# Patient Record
Sex: Female | Born: 1955 | Race: Black or African American | Hispanic: No | Smoking: Never smoker
Health system: Southern US, Community
[De-identification: ages and names within clinical notes are randomized; demographics above are authoritative.]

## PROBLEM LIST (undated history)

## (undated) DIAGNOSIS — R140 Abdominal distension (gaseous): Secondary | ICD-10-CM

## (undated) DIAGNOSIS — M1990 Unspecified osteoarthritis, unspecified site: Secondary | ICD-10-CM

## (undated) DIAGNOSIS — I10 Essential (primary) hypertension: Secondary | ICD-10-CM

## (undated) DIAGNOSIS — K430 Incisional hernia with obstruction, without gangrene: Secondary | ICD-10-CM

## (undated) DIAGNOSIS — M7989 Other specified soft tissue disorders: Secondary | ICD-10-CM

## (undated) DIAGNOSIS — E785 Hyperlipidemia, unspecified: Secondary | ICD-10-CM

## (undated) DIAGNOSIS — Z5189 Encounter for other specified aftercare: Secondary | ICD-10-CM

## (undated) DIAGNOSIS — K469 Unspecified abdominal hernia without obstruction or gangrene: Secondary | ICD-10-CM

## (undated) HISTORY — DX: Encounter for other specified aftercare: Z51.89

## (undated) HISTORY — DX: Essential (primary) hypertension: I10

## (undated) HISTORY — DX: Incisional hernia with obstruction, without gangrene: K43.0

## (undated) HISTORY — DX: Abdominal distension (gaseous): R14.0

## (undated) HISTORY — PX: ABDOMINAL HYSTERECTOMY: SHX81

## (undated) HISTORY — DX: Unspecified abdominal hernia without obstruction or gangrene: K46.9

## (undated) HISTORY — DX: Unspecified osteoarthritis, unspecified site: M19.90

## (undated) HISTORY — PX: HERNIA REPAIR: SHX51

## (undated) HISTORY — DX: Other specified soft tissue disorders: M79.89

## (undated) HISTORY — DX: Hyperlipidemia, unspecified: E78.5

---

## 1998-11-30 ENCOUNTER — Other Ambulatory Visit: Admission: RE | Admit: 1998-11-30 | Discharge: 1998-11-30 | Payer: Self-pay | Admitting: Obstetrics & Gynecology

## 1999-03-28 ENCOUNTER — Encounter: Payer: Self-pay | Admitting: Obstetrics & Gynecology

## 1999-03-28 ENCOUNTER — Ambulatory Visit (HOSPITAL_COMMUNITY): Admission: RE | Admit: 1999-03-28 | Discharge: 1999-03-28 | Payer: Self-pay | Admitting: Obstetrics & Gynecology

## 2005-01-16 HISTORY — PX: HERNIA REPAIR: SHX51

## 2005-11-10 ENCOUNTER — Inpatient Hospital Stay (HOSPITAL_COMMUNITY): Admission: EM | Admit: 2005-11-10 | Discharge: 2005-11-12 | Payer: Self-pay | Admitting: Emergency Medicine

## 2005-11-10 ENCOUNTER — Encounter: Admission: RE | Admit: 2005-11-10 | Discharge: 2005-11-10 | Payer: Self-pay | Admitting: Gastroenterology

## 2010-06-06 ENCOUNTER — Other Ambulatory Visit: Payer: Self-pay | Admitting: Emergency Medicine

## 2010-06-08 ENCOUNTER — Ambulatory Visit
Admission: RE | Admit: 2010-06-08 | Discharge: 2010-06-08 | Disposition: A | Discharge status: Home / Self Care | Payer: BC Managed Care – PPO | Source: Ambulatory Visit | Attending: Emergency Medicine | Admitting: Emergency Medicine

## 2010-06-08 MED ORDER — IOHEXOL 300 MG/ML  SOLN
125.0000 mL | Freq: Once | INTRAMUSCULAR | Status: AC | PRN
  Administered 2010-06-08: 125 mL via INTRAVENOUS

## 2010-08-12 ENCOUNTER — Ambulatory Visit (INDEPENDENT_AMBULATORY_CARE_PROVIDER_SITE_OTHER): Payer: Self-pay | Admitting: Surgery

## 2010-09-07 ENCOUNTER — Ambulatory Visit (INDEPENDENT_AMBULATORY_CARE_PROVIDER_SITE_OTHER): Payer: Self-pay | Admitting: Surgery

## 2010-09-12 ENCOUNTER — Emergency Department (HOSPITAL_COMMUNITY): Payer: BC Managed Care – PPO

## 2010-09-12 ENCOUNTER — Inpatient Hospital Stay (HOSPITAL_COMMUNITY)
Admission: EM | Admit: 2010-09-12 | Discharge: 2010-09-19 | DRG: 148 | Disposition: A | Discharge status: Home / Self Care | Payer: BC Managed Care – PPO | Attending: Surgery | Admitting: Surgery

## 2010-09-12 ENCOUNTER — Other Ambulatory Visit (INDEPENDENT_AMBULATORY_CARE_PROVIDER_SITE_OTHER): Payer: Self-pay | Admitting: Surgery

## 2010-09-12 DIAGNOSIS — R112 Nausea with vomiting, unspecified: Secondary | ICD-10-CM

## 2010-09-12 DIAGNOSIS — I10 Essential (primary) hypertension: Secondary | ICD-10-CM | POA: Diagnosis present

## 2010-09-12 DIAGNOSIS — K660 Peritoneal adhesions (postprocedural) (postinfection): Secondary | ICD-10-CM | POA: Diagnosis present

## 2010-09-12 DIAGNOSIS — K560 Paralytic ileus: Secondary | ICD-10-CM | POA: Diagnosis not present

## 2010-09-12 DIAGNOSIS — K430 Incisional hernia with obstruction, without gangrene: Secondary | ICD-10-CM

## 2010-09-12 DIAGNOSIS — K559 Vascular disorder of intestine, unspecified: Secondary | ICD-10-CM | POA: Diagnosis present

## 2010-09-12 DIAGNOSIS — E7800 Pure hypercholesterolemia, unspecified: Secondary | ICD-10-CM | POA: Diagnosis present

## 2010-09-12 DIAGNOSIS — Z6841 Body Mass Index (BMI) 40.0 and over, adult: Secondary | ICD-10-CM

## 2010-09-12 DIAGNOSIS — R000 Tachycardia, unspecified: Secondary | ICD-10-CM | POA: Diagnosis present

## 2010-09-12 DIAGNOSIS — Z5331 Laparoscopic surgical procedure converted to open procedure: Secondary | ICD-10-CM

## 2010-09-12 DIAGNOSIS — R109 Unspecified abdominal pain: Secondary | ICD-10-CM

## 2010-09-12 DIAGNOSIS — K420 Umbilical hernia with obstruction, without gangrene: Principal | ICD-10-CM | POA: Diagnosis present

## 2010-09-12 DIAGNOSIS — K631 Perforation of intestine (nontraumatic): Secondary | ICD-10-CM

## 2010-09-12 DIAGNOSIS — N289 Disorder of kidney and ureter, unspecified: Secondary | ICD-10-CM | POA: Diagnosis present

## 2010-09-12 HISTORY — PX: OTHER SURGICAL HISTORY: SHX169

## 2010-09-12 HISTORY — PX: SMALL INTESTINE SURGERY: SHX150

## 2010-09-12 LAB — DIFFERENTIAL
Eosinophils Absolute: 0 10*3/uL (ref 0.0–0.7)
Lymphocytes Relative: 17 % (ref 12–46)
Lymphs Abs: 2 10*3/uL (ref 0.7–4.0)
Monocytes Relative: 10 % (ref 3–12)
Neutro Abs: 8.6 10*3/uL — ABNORMAL HIGH (ref 1.7–7.7)
Neutrophils Relative %: 73 % (ref 43–77)

## 2010-09-12 LAB — COMPREHENSIVE METABOLIC PANEL
ALT: 8 U/L (ref 0–35)
AST: 17 U/L (ref 0–37)
CO2: 25 mEq/L (ref 19–32)
Calcium: 10.8 mg/dL — ABNORMAL HIGH (ref 8.4–10.5)
Chloride: 99 mEq/L (ref 96–112)
Creatinine, Ser: 1.18 mg/dL — ABNORMAL HIGH (ref 0.50–1.10)
GFR calc Af Amer: 57 mL/min — ABNORMAL LOW (ref >60)
GFR calc non Af Amer: 47 mL/min — ABNORMAL LOW (ref >60)
Glucose, Bld: 100 mg/dL — ABNORMAL HIGH (ref 70–99)
Sodium: 137 mEq/L (ref 135–145)
Total Bilirubin: 0.5 mg/dL (ref 0.3–1.2)

## 2010-09-12 LAB — URINALYSIS, ROUTINE W REFLEX MICROSCOPIC
Glucose, UA: NEGATIVE mg/dL
Hgb urine dipstick: NEGATIVE
Ketones, ur: 15 mg/dL — AB
Protein, ur: NEGATIVE mg/dL
Urobilinogen, UA: 0.2 mg/dL (ref 0.0–1.0)

## 2010-09-12 LAB — CBC
Hemoglobin: 14.6 g/dL (ref 12.0–15.0)
MCH: 26.1 pg (ref 26.0–34.0)
MCV: 80 fL (ref 78.0–100.0)
Platelets: 359 10*3/uL (ref 150–400)
RBC: 5.6 MIL/uL — ABNORMAL HIGH (ref 3.87–5.11)
WBC: 11.8 10*3/uL — ABNORMAL HIGH (ref 4.0–10.5)

## 2010-09-13 LAB — CBC
HCT: 33.5 % — ABNORMAL LOW (ref 36.0–46.0)
MCH: 25.6 pg — ABNORMAL LOW (ref 26.0–34.0)
MCHC: 32.2 g/dL (ref 30.0–36.0)
MCV: 79.4 fL (ref 78.0–100.0)
Platelets: 248 10*3/uL (ref 150–400)
RDW: 17.5 % — ABNORMAL HIGH (ref 11.5–15.5)

## 2010-09-13 LAB — BASIC METABOLIC PANEL
BUN: 15 mg/dL (ref 6–23)
Calcium: 8.8 mg/dL (ref 8.4–10.5)
Creatinine, Ser: 1.13 mg/dL — ABNORMAL HIGH (ref 0.50–1.10)
GFR calc Af Amer: >60 mL/min (ref >60)
GFR calc non Af Amer: 50 mL/min — ABNORMAL LOW (ref >60)

## 2010-09-14 DIAGNOSIS — I10 Essential (primary) hypertension: Secondary | ICD-10-CM

## 2010-09-14 DIAGNOSIS — N289 Disorder of kidney and ureter, unspecified: Secondary | ICD-10-CM

## 2010-09-14 LAB — CBC
HCT: 33.4 % — ABNORMAL LOW (ref 36.0–46.0)
MCHC: 31.7 g/dL (ref 30.0–36.0)
Platelets: 260 10*3/uL (ref 150–400)
RDW: 17.9 % — ABNORMAL HIGH (ref 11.5–15.5)

## 2010-09-14 LAB — BASIC METABOLIC PANEL
BUN: 19 mg/dL (ref 6–23)
GFR calc Af Amer: 52 mL/min — ABNORMAL LOW (ref >60)
GFR calc non Af Amer: 43 mL/min — ABNORMAL LOW (ref >60)
Potassium: 3.8 mEq/L (ref 3.5–5.1)
Sodium: 135 mEq/L (ref 135–145)

## 2010-09-15 LAB — BASIC METABOLIC PANEL
Chloride: 101 mEq/L (ref 96–112)
Creatinine, Ser: 0.83 mg/dL (ref 0.50–1.10)
GFR calc Af Amer: >60 mL/min (ref >60)
Sodium: 136 mEq/L (ref 135–145)

## 2010-09-15 NOTE — H&P (Signed)
[name], [name]             ACCOUNT NO.:  [id number]  MEDICAL RECORD NO.:  [id number]  LOCATION:  WLED                         FACILITY:  [facility]  PHYSICIAN:  Adin Hector, MD     DATE OF BIRTH:  1955-12-29  DATE OF ADMISSION:  09/12/2010 DATE OF DISCHARGE:                             HISTORY & PHYSICAL   REQUESTING PHYSICIAN:  Dr. Tomi Bamberger.  PRIMARY CARE PHYSICIAN:  Dr. Arlyss Queen, [facility].  OTHER SURGEON:  Dr. Alphonsa Overall.  REASON FOR EVALUATION:  Nausea, vomiting, abdominal pain, history of ventral hernia, consideration of question of incarceration.  HISTORY OF PRESENT ILLNESS:  [name] is a 55 year old super morbidly obese female with a BMI around 38, who required emergent operation in 2007.  She had an incarcerated periumbilical ventral hernia that required emergent repair by Dr. Lucia Gaskins in an open fashion with onlay MTF mesh.  She survived that.  She began to have some abdominal pain and went to the ER in May 2012.  CT scan revealed a recurrent ventral hernia.  It was not obstructing.  She was sent to see Dr. Lucia Gaskins for consideration of elective repair.  He is trying to get her on weight loss regimen to help decrease her risk of recurrence.  However, 2 days ago, she noted an episode of periumbilical abdominal pain.  [facility].  The pain was intensified.  She went to [facility] [place] around 8 o'clock this morning.  They did workup and were concerned about incarcerated hernia.  She was recommended she go to the emergency department.  She got here and had workup showing some leukocytosis.  Then we are going to finally get a CAT scan done this afternoon and it was finished around 5 o'clock.  I was consulted around 6 o'clock while I was already in the emergency room.  She claims her pain had gone down, but since taking the contrast, she is having worsening heartburn and abdominal discomfort now.  She has not had flatus for  several days and does not had a bowel movement for a couple of days that she only has on a daily basis.  She has been feeling nauseous as well.  She is here with a friend.  She does have heartburn, but no more now, that is well controlled.  PAST MEDICAL HISTORY: 1. Morbid obesity. 2. Hypertension. 3. Hypercholesterolemia. 4. Prior hernia, requiring emergent open repair with biologic mesh in     a routine fashion.  ALLERGIES:  None known.  MEDICATIONS:  She states she cannot recall her medicines, but on review, she takes, 1. Hydrochlorothiazide 12.5 mg p.o. daily. 2. Naproxen q.8 h. p.r.n. pain. 3. Nexium 40 mg daily. 4. Lipitor 40 mg p.o. daily.  SOCIAL HISTORY:  No tobacco, alcohol, or drug use.  She works as a Training and development officer for [employer].  FAMILY HISTORY:  Negative for any immediate GI or bleeding disorders that she can recall.  REVIEW OF SYSTEMS:  As per HPI.  She denies any fever, chills, or sweats.  She has lost about 10 pounds in the past few months.  CARDIAC: No exertional chest pain, although she gets short of  breath after walking about 3 blocks.  No angina.  No significant orthopnea or PND. GI:  No hematochezia or melena.  No hematemesis.  No dysphagia to solids or liquids.  Otherwise, negative.  SKIN:  Negative.  ENDOCRINE: Negative.  HEME, LYMPH, ALLERGIC:  Negative.  PSYCHIATRIC:  Negative. MUSCULOSKELETAL:  Some mild joint disorders, but no major changes.  PHYSICAL EXAMINATION:  VITAL SIGNS:  Temperature 97.9, pulse initially 119, currently 105, respiratory rate 18, initially 8/10 pain when she came in here at 10:30 in the morning, now she claims down to around 3/10 to 4/10.  Saturation 95% on room air and blood pressure 138/90. GENERAL:  She is a well-developed and well-nourished, morbidly obese female, sitting, particularly toxic, little uncomfortable. EYES:  Pupils equal, round, and reactive to light.  Extraocular movements are intact.  Her sclerae are  nonicteric. NECK:  Supple.  No masses.  Trachea is midline. HEART:  Regular rate and rhythm.  No murmurs, clicks, or rubs. LYMPH:  No head, neck, axillary, or groin lymphadenopathy. ENT:  Her mucous membranes are dry, but nasopharynx and oropharynx are clear. BREASTS:  No obvious masses or discharge. LUNGS:  Clear to auscultation bilaterally. ABDOMEN:  Morbidly obese, but for the most part soft.  She does have little firmness supraumbilically.  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  I lifted up, there is a mild foul odor, but otherwise no ulcerations.  Underneath that I can feel a 10 x 10 cm ellipsoid mass that is firm and uncomfortable.  I can feel little bit of bone marrow within it, but I cannot get it to reduce down.  It is uncomfortable on palpation, but not unbearable.  She has an incision supraumbilically that is well healed.  I can feel the hernial sac through the top part of her umbilicus.  It is uncomfortable. GENITAL:  No obvious vaginal bleeding, no inguinal hernias. RECTAL:  Deferred. MUSCULOSKELETAL:  Pretty good range of motion at shoulders, elbows, wrists, as well as hips, knees, and ankles. SKIN:  No petechia or purpura. PSYCHIATRIC:  Seems pleasant with no evidence of any dementia, delirium, psychosis, or paranoia.  LABORATORY VALUES:  Her white count is 11.8, which does have left shift. At triage, her creatinine is 1.18 and glucose 100.  She has a chest x- ray that does not show any major changes.  She has a CT scan, which shows a ventral hernia with small bowel within it.  This is compared with the one in May.  However, at this point, it is more dilated and has dilated bowel proximal and decompressed distal to it.  This is concerning for bowel obstruction.  There is no pneumatosis.  There is no free air or obvious perforation.  Her bowel is not massively dilated nor as her colon.  ASSESSMENT AND PLAN:  A super morbid obese female with  evidence of bowel obstruction with obstipation for 2 days and persistent pain, little bit of tachycardia and leukocytosis.  I am concerned this is incarcerated.  Hopefully, it has been necrotic, but she has been going on with this for being evaluated for the past 11 hours.  I recommend we start with diagnostic laparoscopy and probable laparotomy, given her body habitus, but  I would like to try and minimize wound issues if we can.  I will try and reduce the hernia laparoscopically and if there is no evidence of any ischemia or necrosis, and no bowel resection is required,  then repair it with a mesh.  Otherwise, will have a low threshold to open and just try and do primary repair with underlay, although given her super morbid obesity, she is a high risk for recurrence.  Again, Dr. Lucia Gaskins already elucidated.  The technique and risks, benefits, and alternatives were discussed. Risks of bleeding and infection discussed in detail.  However, given how long she has had persistent symptoms and pain, I do not think that she can weight any longer and we told that NG tube is going to solve this problem since I cannot get it reduced.  She and her family agreed to proceed.     Adin Hector, MD     SCG/MEDQ  D:  09/12/2010  T:  09/12/2010  Job:  [id number]  Electronically Signed by Michael Boston MD on 09/15/2010 01:36:27 PM

## 2010-09-15 NOTE — Op Note (Signed)
[name], [name]             ACCOUNT NO.:  [id number]  MEDICAL RECORD NO.:  [id number]  LOCATION:  [id number]                         FACILITY:  [facility]  PHYSICIAN:  Virginia Hector, MD     DATE OF BIRTH:  04-Mar-1955  DATE OF PROCEDURE:  09/12/2010 DATE OF DISCHARGE:                              OPERATIVE REPORT   PRIMARY CARE PHYSICIAN:  Virginia Landry A. Everlene Farrier, MD  PRIMARY SURGEON:  Virginia Malling. Lucia Gaskins, MD  OPERATING SURGEON:  Virginia Hector, MD  ASSISTANT:  RN  PREOPERATIVE DIAGNOSIS:  Recurrent incarcerated periumbilical ventral hernia with small bowel obstruction.  POSTOPERATIVE DIAGNOSIS:  Recurrent incarcerated periumbilical ventral hernia with small bowel obstruction.  PROCEDURES PERFORMED: 1. Laparoscopic lysis of adhesions for 90 minutes. 2. Open lysis of adhesions for 30 minutes. 3. Serosal repair x2. 4. Enterotomy repair x1. 5. Small bowel resection 30 cm. 6. Open ventral hernia repair with mesh (Underlay MTF biologic mesh,     10 x 16 cm).  SPECIMENS:  Incarcerated small bowel.  DRAINS:  A 19-French Blake drain, goes from left upper quadrant, rests on the subcutaneous tissues on top of the repair.  ESTIMATED BLOOD LOSS:  250 mL.  COMPLICATIONS:  None major.  INDICATIONS:  [name] is a 55 year old morbidly obese female with a BMI in the mid-50s who had obstipation for 2 days and was found to have evidence of an incarcerated ventral hernia with small bowel obstruction. She had a similar episode 5 years ago.  Dr. Lucia Myers had to do emergently a surgery on her and had to resect out a large hernia cavity and do repair with biologic mesh.  She developed abdominal pain and was found to have evidence of recurrence on a CT scan in May.  She had seen Dr. Lucia Myers there, we are discussing possibility of an elective repair, but there is discussion about trying to improve weight loss.  However, she developed these symptoms past 2 days.  She went to the [facility] [place] this  morning.  They sent her to the emergency room.  They evaluated her today.  They got a CAT scan in the evening.  I was consulted an hour after the CAT scan showing bowel obstruction.  The pathophysiology of herniation was discussed.  Given the fact that hernia was not reducible and she has pain, leukocytosis and some tachycardia, I recommended an urgent exploration.  Laparoscopic and possible open techniques were discussed, possible need for bowel resection and other issues were discussed.  Risks, benefits, and alternatives were discussed, and she and her sisters agreed to proceed.  OPERATIVE FINDINGS:  She had some moderate central adhesions.  She had a significant loop of small bowel that was adherent into the hernia sac and cannot be reduced laparoscopically.  She had some dense bands within the hernia sac causing at least 2 very tight transition points.  She had numerous smaller Swiss cheese hernias nearby as well.  She also had ischemia, but no definite necrosis within the sac.  DESCRIPTION OF THE PROCEDURE:  Informed consent was confirmed.  The patient received IV Zosyn prior to incision.  She underwent general anesthesia without any difficulty.  She had a Foley catheter still  in place.  She was positioned supine with arms tucked with splint.  Her arms were placed to side and protected with a sledge since she was too obese to do just tucking of arms.  Her abdomen and panniculus were prepped and draped in sterile fashion.  Surgical time-out confirmed our plan.  I placed a #5 mm port in the left upper quadrant using optical entry technique with the patient in steep reverse Trendelenburg and left side up.  Abdomen was clean.  Under direct visualization, I placed 5-0 ports in the left flank and left upper quadrant and later in the right upper quadrant.  I then transitioned one to a balloon port, especially near the flank __________ coming out.  Inspection revealed some omentum  and large small to small bowel going centrally on the anterior abdominal wall.  Using cold scissors and with tongs, I was able to free the greater omentum off this and started freeing loops of bowel off the anterior abdominal wall.  There were some rather dense tight bands of adhesions that were gradually able to free these off.  Then found some omentum and small bowel mesentery incarcerated up in smaller pockets of less than 1 cm of ventral hernia, which I gradually reduced down.  I could find the distal end was decompressed and seemed to more in the proximal ileum, but I cannot give it to reduce for  a while.  I did try changing camera and looking on the opposite site, on the right side.  I did some more sharp dissections.  I was able to reduce some small bowel out of the hernia sac after freeing the hernia sac completely around. However, I got to the point where I was having to do a fair amount of torquing and I could not get it to physically reduce any more with external pressure on the hernia sac and laparoscopically.  Therefore, I converted to open.  I went through the periumbilical incision and did dissection and found a very thickened hernia sac, about 10 x 10 cm in size.  Eventually freed around it and could gradually get into part of the sac.  It was quite loculated off with numerous bridging bands.  Eventually, I was able to open the hernia sac and find the proximal end and help mobilize that.  Over time, I was able to open up the hernia sac pocket by pocket until I could get the section of small bowel that was incarcerated within it, better freed up.  There were some dense bands within the hernia sac itself that were bridging across the bowel, and once I this freed these off, I have at least some obvious transition points x2.  I brought in some small bowel up from the abdomen as well and inspected.  There was a small enterotomy, which I repaired using interrupted silks, and  then there were a few serosal tears. However, there were couple areas, where the bowel had gotten thinned out.  The bowel did not seem to be pinking up as it was seen rather ischemic.  Given the fact that it already had a couple transition points and some ischemia and friable serosa on it, I decided to resect to the sections, since that already had been an enterotomy.  Therefore, I did a side-to- side anastomosis and resected this __________ of inflamed small bowel with numerous adhesions to hernia sac, advanced to transition point until I had healthy uninvolved bowel.  I used the GIA stapler 75, doing  a side-to-side anastomosis.  I closed the common defect using interrupted silk stitches and closed the mesenteric defect using 2-0 silk stitches as well.  I had taken the mesentery using a bipolar system.  I used 2 interrupted stitches for hemostasis as well.  I then have to open up the hernia little bit more to help ultimately release the bowel and also to help it return back down.  With that, I can better see the dimensions of the ventral hernia defect, this was about 5 x 6 cm in size.  I went ahead and mobilized some of the hernia sac although it was somewhat ischemic and not too viable, but I was able to find the peritoneum and free that around circumferentially.  I decided to use an underlay biologic mesh since Dr. Lucia Myers has used an onlay.  I did not think it would be wise in an emergent situation, especially with breached  Bowel and some ischemia, to place a permanent mesh in there. I chose a 10 x 16 cm piece of biologic mesh.  I closed the peritoneum using 0 Vicryl running stitch.  I placed the mesh into the preperitoneal plane and secured it to the anterior abdominal wall using transabdominal transfascial #1 Prolene interrupted stitches x12 around the periphery. I used a laparoscopic suture passer under direct visualization to help pass it into the preperitoneal plane and bring  those tails up and tie down.  This provided good coverage all the way down, the mesh laid well without any folding or twisting.  I then reapproximated the hernia defect using #1 Prolene figure-of-8 stitches, interrupted.  I took a couple of bites of the biologic mesh with the closure as well to help secure it in place.  I did diagnostic laparoscopy and found no major injury to bowel. Hemostasis was good.  I saw no specific contents.  I copiously irrigated the wound after each layer of closure including just before skin closure.  I placed a drain, as noted above.  I closed the skin using staples.  I removed all the ports, 5 mm ports, I did not do more aggressive fashion closure.  The patient is being extubated and will be in recovery room.  We will keep her a nasogastric tube in given the obstruction with obvious transition zones and small bowel resection.  The family was updated during the OR case and I am about to discuss it with them again.     Virginia Hector, MD     SCG/MEDQ  D:  09/13/2010  T:  09/13/2010  Job:  [id number]  cc:   [name], M.D. Fax: [place]. Lucia Myers, M.D. [id number] [address] [place] [address]  Electronically Signed by Michael Boston MD on 09/15/2010 01:36:53 PM

## 2010-09-22 ENCOUNTER — Ambulatory Visit (INDEPENDENT_AMBULATORY_CARE_PROVIDER_SITE_OTHER): Payer: BC Managed Care – PPO | Admitting: Surgery

## 2010-09-22 ENCOUNTER — Encounter (INDEPENDENT_AMBULATORY_CARE_PROVIDER_SITE_OTHER): Payer: Self-pay | Admitting: Surgery

## 2010-09-22 VITALS — BP 152/98 | HR 60 | Temp 96.8°F

## 2010-09-22 DIAGNOSIS — R600 Localized edema: Secondary | ICD-10-CM

## 2010-09-22 DIAGNOSIS — K430 Incisional hernia with obstruction, without gangrene: Secondary | ICD-10-CM

## 2010-09-22 DIAGNOSIS — R609 Edema, unspecified: Secondary | ICD-10-CM

## 2010-09-22 DIAGNOSIS — K436 Other and unspecified ventral hernia with obstruction, without gangrene: Secondary | ICD-10-CM

## 2010-09-22 HISTORY — DX: Incisional hernia with obstruction, without gangrene: K43.0

## 2010-09-22 NOTE — Progress Notes (Addendum)
Subjective:     Patient ID: [name], female   DOB: 1955/12/27, 55 y.o.   MRN: [id number]  HPI  Diagnosis: Recurrently incarcerated periumbilical ventral hernia incarcerated with some strangulation.  Procedure: Emergent laparoscopic and open reduction and repair of hernia with biologic mesh. Small bowel resection. August 2012.   Reason for visit: Followup.  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 Lucia Gaskins. She developed recurrence. He was working try and see if she could lose some weight before considering a laparoscopic repair when she came in with an incarcerated hernia and bowel obstruction. She required emergent surgery. She was the hospital for some time but has been out of the hospital for 3 days.  She patient comes today with her sister. She notes her appetite is down but she is tolerating most foods. Loose bowel movement most days. She's been out of the hospital for 3 days. Pain is going down. No hematochezia hematemesis nor melena. No fevers chills or sweats. Energy level is down but improving. She notes her legs are swollen and as a concern especially with her sister.  Review of Systems  Constitutional: Negative for fever, chills, diaphoresis, appetite change and fatigue.  HENT: Negative for ear pain, sore throat, trouble swallowing, neck pain and ear discharge.   Eyes: Negative for photophobia, discharge and visual disturbance.  Respiratory: Negative for cough, choking, chest tightness and shortness of breath.   Cardiovascular: Positive for leg swelling. Negative for chest pain and palpitations.  Gastrointestinal: Positive for abdominal pain. Negative for nausea, vomiting, diarrhea, constipation, anal bleeding and rectal pain.  Genitourinary: Negative for dysuria, frequency and difficulty urinating.  Musculoskeletal: Negative for myalgias and gait problem.  Skin: Negative for  color change, pallor and rash.  Neurological: Negative for dizziness, speech difficulty, weakness and numbness.  Hematological: Negative for adenopathy.  Psychiatric/Behavioral: Negative for confusion and agitation. The patient is not nervous/anxious.        Objective:   Physical Exam  Constitutional: She is oriented to person, place, and time. She appears well-developed and well-nourished. No distress.  HENT:  Head: Normocephalic.  Mouth/Throat: Oropharynx is clear and moist. No oropharyngeal exudate.  Eyes: Conjunctivae and EOM are normal. Pupils are equal, round, and reactive to light. No scleral icterus.  Neck: Normal range of motion. Neck supple. No tracheal deviation present.  Cardiovascular: Normal rate, regular rhythm and intact distal pulses.   Pulmonary/Chest: Effort normal and breath sounds normal. No respiratory distress. She exhibits no tenderness.  Abdominal: Soft. She exhibits no distension. There is no rebound and no guarding. Hernia confirmed negative in the right inguinal area and confirmed negative in the left inguinal area.       Midline umb incision and lap incisions closed.  Staples removed.  SQ bulb drain ~19mL a day.  Drain removed  Large panniculus to mid thigh.  No cellulitis  Genitourinary: No vaginal discharge found.  Musculoskeletal: Normal range of motion. She exhibits no tenderness.       BLE mod 2+ edema to calf  Lymphadenopathy:    She has no cervical adenopathy.       Right: No inguinal adenopathy present.       Left: No inguinal adenopathy present.  Neurological: She is alert and oriented to person, place, and time. No cranial nerve deficit. She exhibits normal muscle tone. Coordination normal.  Skin: Skin is warm and dry. No rash noted. She is  not diaphoretic. No erythema.  Psychiatric: She has a normal mood and affect. Her behavior is normal. Judgment and thought content normal.       Assessment:     Recurrent incarcerated ventral hernia with  some strangulation causing bowel obstruction  requiring emergent repair and resection. Gradually recovering.    Plan:     Considering what she's been through, I think she's recovering relatively well. I stressed that nutrition and exercise are the key to recovering. Recommend she try supplemental shakes. Given her morbid obesity and arthralgias, I think the exercise will be rather limited. However it should get better. I recommend she get on a fiber bowel regimen to keep regular.  I would like to see her in a few more weeks to make she she's getting stronger. Hopefully with the larger piece of biologic mesh she can avoid a repeat surgery. If she does recur, she could get benefit with panniculectomy concurrent with her hernia to minimize chance of another recurrence. However, that is a much larger operation with increased morbidity and wound problems.  Return to clinic to 3 weeks to make sure she's continued to improve.  With her increased edema recommend she double hydrochlorothiazide for the next week. However, I strongly recommend she followup with her primary care physician to make sure that she has or other health issues undressed and follow closely given the stress of an emergent operation and prolonged hospital stay. She and her sister expressed understanding and appreciation.

## 2010-09-22 NOTE — Patient Instructions (Signed)
Increase HCTZ diuretic to 25mg  (2 pills) a day (for leg swelling) until 13Sept, then back to 12.5 mg a day.  Shower over incisions every day

## 2010-09-27 ENCOUNTER — Emergency Department (HOSPITAL_COMMUNITY)
Admission: EM | Admit: 2010-09-27 | Discharge: 2010-09-28 | Disposition: A | Discharge status: Home / Self Care | Payer: BC Managed Care – PPO | Attending: Emergency Medicine | Admitting: Emergency Medicine

## 2010-09-27 ENCOUNTER — Emergency Department (HOSPITAL_COMMUNITY): Payer: BC Managed Care – PPO

## 2010-09-27 DIAGNOSIS — R0602 Shortness of breath: Secondary | ICD-10-CM | POA: Insufficient documentation

## 2010-09-27 DIAGNOSIS — R000 Tachycardia, unspecified: Secondary | ICD-10-CM | POA: Insufficient documentation

## 2010-09-27 DIAGNOSIS — E785 Hyperlipidemia, unspecified: Secondary | ICD-10-CM | POA: Insufficient documentation

## 2010-09-27 DIAGNOSIS — M7989 Other specified soft tissue disorders: Secondary | ICD-10-CM | POA: Insufficient documentation

## 2010-09-27 DIAGNOSIS — I10 Essential (primary) hypertension: Secondary | ICD-10-CM | POA: Insufficient documentation

## 2010-09-27 DIAGNOSIS — R609 Edema, unspecified: Secondary | ICD-10-CM | POA: Insufficient documentation

## 2010-09-27 DIAGNOSIS — Z9889 Other specified postprocedural states: Secondary | ICD-10-CM | POA: Insufficient documentation

## 2010-09-27 DIAGNOSIS — L02219 Cutaneous abscess of trunk, unspecified: Secondary | ICD-10-CM | POA: Insufficient documentation

## 2010-09-27 LAB — POCT I-STAT, CHEM 8
BUN: 4 mg/dL — ABNORMAL LOW (ref 6–23)
Hemoglobin: 10.5 g/dL — ABNORMAL LOW (ref 12.0–15.0)
Sodium: 136 mEq/L (ref 135–145)
TCO2: 25 mmol/L (ref 0–100)

## 2010-09-28 ENCOUNTER — Encounter (HOSPITAL_COMMUNITY): Payer: Self-pay

## 2010-09-28 LAB — DIFFERENTIAL
Basophils Absolute: 0 10*3/uL (ref 0.0–0.1)
Basophils Relative: 0 % (ref 0–1)
Neutro Abs: 7.4 10*3/uL (ref 1.7–7.7)
Neutrophils Relative %: 77 % (ref 43–77)

## 2010-09-28 LAB — URINALYSIS, ROUTINE W REFLEX MICROSCOPIC
Leukocytes, UA: NEGATIVE
Protein, ur: NEGATIVE mg/dL
Urobilinogen, UA: 1 mg/dL (ref 0.0–1.0)

## 2010-09-28 LAB — PRO B NATRIURETIC PEPTIDE: Pro B Natriuretic peptide (BNP): 162.4 pg/mL — ABNORMAL HIGH (ref 0–125)

## 2010-09-28 LAB — CBC
Hemoglobin: 9.8 g/dL — ABNORMAL LOW (ref 12.0–15.0)
RBC: 3.86 MIL/uL — ABNORMAL LOW (ref 3.87–5.11)

## 2010-09-28 LAB — PROCALCITONIN: Procalcitonin: 0.13 ng/mL

## 2010-09-28 LAB — LACTIC ACID, PLASMA: Lactic Acid, Venous: 1.3 mmol/L (ref 0.5–2.2)

## 2010-09-28 MED ORDER — IOHEXOL 300 MG/ML  SOLN
100.0000 mL | Freq: Once | INTRAMUSCULAR | Status: AC | PRN
  Administered 2010-09-28: 100 mL via INTRAVENOUS

## 2010-09-29 ENCOUNTER — Inpatient Hospital Stay (HOSPITAL_COMMUNITY)
Admission: EM | Admit: 2010-09-29 | Discharge: 2010-10-03 | DRG: 418 | Disposition: A | Discharge status: Home / Self Care | Payer: BC Managed Care – PPO | Attending: General Surgery | Admitting: General Surgery

## 2010-09-29 DIAGNOSIS — T8140XA Infection following a procedure, unspecified, initial encounter: Principal | ICD-10-CM | POA: Diagnosis present

## 2010-09-29 DIAGNOSIS — I10 Essential (primary) hypertension: Secondary | ICD-10-CM | POA: Diagnosis present

## 2010-09-29 DIAGNOSIS — D649 Anemia, unspecified: Secondary | ICD-10-CM | POA: Diagnosis present

## 2010-09-29 DIAGNOSIS — E785 Hyperlipidemia, unspecified: Secondary | ICD-10-CM | POA: Diagnosis present

## 2010-09-29 DIAGNOSIS — L02219 Cutaneous abscess of trunk, unspecified: Secondary | ICD-10-CM | POA: Diagnosis present

## 2010-09-29 DIAGNOSIS — L03319 Cellulitis of trunk, unspecified: Secondary | ICD-10-CM | POA: Diagnosis present

## 2010-09-29 DIAGNOSIS — K219 Gastro-esophageal reflux disease without esophagitis: Secondary | ICD-10-CM | POA: Diagnosis present

## 2010-09-29 LAB — CBC
MCH: 25 pg — ABNORMAL LOW (ref 26.0–34.0)
MCHC: 31.5 g/dL (ref 30.0–36.0)
MCV: 79.3 fL (ref 78.0–100.0)
Platelets: 404 10*3/uL — ABNORMAL HIGH (ref 150–400)
RBC: 3.68 MIL/uL — ABNORMAL LOW (ref 3.87–5.11)
RDW: 17.4 % — ABNORMAL HIGH (ref 11.5–15.5)

## 2010-09-29 LAB — BASIC METABOLIC PANEL
CO2: 25 mEq/L (ref 19–32)
Calcium: 9 mg/dL (ref 8.4–10.5)
Creatinine, Ser: 0.85 mg/dL (ref 0.50–1.10)
GFR calc non Af Amer: >60 mL/min (ref >60)
Sodium: 135 mEq/L (ref 135–145)

## 2010-09-29 LAB — DIFFERENTIAL
Basophils Relative: 0 % (ref 0–1)
Eosinophils Absolute: 0 10*3/uL (ref 0.0–0.7)
Monocytes Relative: 12 % (ref 3–12)
Neutrophils Relative %: 77 % (ref 43–77)

## 2010-09-30 LAB — BASIC METABOLIC PANEL
Calcium: 8.7 mg/dL (ref 8.4–10.5)
GFR calc Af Amer: >60 mL/min (ref >60)
GFR calc non Af Amer: >60 mL/min (ref >60)
Glucose, Bld: 118 mg/dL — ABNORMAL HIGH (ref 70–99)
Sodium: 139 mEq/L (ref 135–145)

## 2010-09-30 LAB — CBC
MCH: 25 pg — ABNORMAL LOW (ref 26.0–34.0)
MCHC: 31.4 g/dL (ref 30.0–36.0)
Platelets: 417 10*3/uL — ABNORMAL HIGH (ref 150–400)
RBC: 3.52 MIL/uL — ABNORMAL LOW (ref 3.87–5.11)

## 2010-10-01 LAB — WOUND CULTURE

## 2010-10-03 ENCOUNTER — Encounter (INDEPENDENT_AMBULATORY_CARE_PROVIDER_SITE_OTHER): Payer: BC Managed Care – PPO | Admitting: Surgery

## 2010-10-04 LAB — CULTURE, BLOOD (ROUTINE X 2)

## 2010-10-07 NOTE — Discharge Summary (Signed)
[name], [name]             ACCOUNT NO.:  [id number]  MEDICAL RECORD NO.:  [id number]  LOCATION:  92                         FACILITY:  [facility]  PHYSICIAN:  Edsel Petrin. Dalbert Batman, M.D.DATE OF BIRTH:  [birth year]  DATE OF ADMISSION:  09/29/2010 DATE OF DISCHARGE:  10/03/2010                              DISCHARGE SUMMARY   ADMISSION DIAGNOSES: 1. Infected abdominal wound, status post repair of incarcerated     paraumbilical hernia. 2. Morbid obesity. 3. Hypertension. 4. Dyslipidemia. 5. Gastroesophageal reflux disease. 6. Anemia.  DISCHARGE DIAGNOSES: 1. Infected abdominal wound, status post repair of incarcerated     paraumbilical hernia. 2. Morbid obesity. 3. Hypertension. 4. Dyslipidemia. 5. Gastroesophageal reflux disease. 6. Anemia.  PROCEDURES:  None.  CULTURE:  Klebsiella pneumoniae, the best sensitivity was ciprofloxacin.  BRIEF HISTORY:  The patient is a 55 year old African American female who underwent laparoscopic lysis of adhesions for 90 minutes, open lysis of adhesions for 30 minutes, serosal repair x2, enterotomy repair x2, small bowel resection of an open ventral hernia with underlay MTF biologic mesh on September 12, 2010.  She was discharged home on September 19, 2010. She reports doing well.  She came back on September 27, 2010, with a dry mouth and some shortness of breath.  A CT scan was obtained which showed no pulmonary embolus.  CT of the abdomen showed an air fluid collection and subcutaneous fat in the area of the prior hernia.  There is also small bowel loop in the mid abdomen under the mesh with stranding and thickening, but no obstruction.  The patient was treated with IV clindamycin and discharged.  She returned around 2 a.m., September 29, 2010, with large volume of purulent material draining from her mid abdominal incision.  EMS was called, she was brought back to the ER where she had an area that was about 1.5 cm in diameter which was  open and purulent.  It can be probed to about 2.5 to 3 inches.  It was fairly malodorous.  HOSPITAL COURSE:  We saw the patient in the ER, she was started on Invanz IV.  She was started on wet-to-dry local wound changes and hydrated.  She grew out a Klebsiella pneumoniae which was sensitive to ciprofloxacin.  She has been switched over from [place] to IV Cipro and then oral Cipro.  She has remained afebrile.  The wound is healing and nicely granulating from the bottom, it is pain and clean.  The patient did her own dressing change this morning.  She is ambulating without any significant difficulty and it was Dr. Darrel Hoover opinion she could be discharged home.  She will resume her preadmission medicines as before. We are going to put her on Cipro 500 mg p.o. b.i.d., let her shower, and then wet-to-dry dressings b.i.d.  She already has a followup appointment Dr. Johney Maine on October 12, 2010.  CONDITION ON DISCHARGE:  Improving.  DISCHARGE MEDICATIONS: 1. She can resume her naproxen 220 mg 2 tablets q.8 p.r.n. 2. Hydrochlorothiazide 12.5 mg daily. 3. Lipitor 40 mg daily.  I am going to give her new prescription for Percocet 1-2 p.o. q.4.  We started her on a multivitamin with iron 1 daily and  ferrous sulfate 325 mg daily.  We are going to put her on Cipro 500 mg p.o. b.i.d. and she can also take plain Tylenol for moderate pain 650 mg q.6 p.r.n.     [name], P.A.   ______________________________ Edsel Petrin. Dalbert Batman, M.D.    WDJ/MEDQ  D:  10/03/2010  T:  10/03/2010  Job:  [id number]  cc:   Adin Hector, MD [address] [place] [address]  Dr. Laney Pastor  Electronically Signed by Fanny Skates M.D. on 10/07/2010 03:28:50 PM

## 2010-10-07 NOTE — Discharge Summary (Signed)
  NAMECEDRIANA, [name]             ACCOUNT NO.:  [id number]  MEDICAL RECORD NO.:  [id number]  LOCATION:  69                         FACILITY:  [facility]  PHYSICIAN:  Edsel Petrin. Dalbert Batman, M.D.DATE OF BIRTH:  05-Oct-1955  DATE OF ADMISSION:  09/29/2010 DATE OF DISCHARGE:  10/03/2010                              DISCHARGE SUMMARY   ADDENDUM:     BRIEF HISTORY:  Patient is a 55 year old female, who was admitted with correction of her abdominal wall after undergoing hernia repair.  She had blood cultures performed on her admission.  At the time of discharge, they were negative.  Today, the patient's blood culture grew out a Haemophilus parainfluenzae in one of the two blood cultures.  This was called by the lab to me by Jannett Celestine of the laboratory services.  It was discussed with Dr. Fanny Skates and it was his opinion if she was asymptomatic, no further treatment would be indicated at this point.  She is at home on antibiotics as noted in the original discharge summary as scheduled for followup with Dr. Johney Maine in about 2 weeks.     Lydia Guiles, P.A.   ______________________________ Edsel Petrin. Dalbert Batman, M.D.    WDJ/MEDQ  D:  10/04/2010  T:  10/05/2010  Job:  [id number]  Electronically Signed by Fanny Skates M.D. on 10/07/2010 03:29:07 PM

## 2010-10-10 NOTE — H&P (Signed)
[name], [name]             ACCOUNT NO.:  [id number]  MEDICAL RECORD NO.:  [id number]  LOCATION:  WLED                         FACILITY:  [facility]  PHYSICIAN:  Marcello Moores A. Heba Ige, M.D.DATE OF BIRTH:  1955-06-21  DATE OF ADMISSION:  09/29/2010 DATE OF DISCHARGE:                             HISTORY & PHYSICAL   PRIMARY CARE PHYSICIAN:  Robert P. Laney Pastor, M.D., [facility].  REASON FOR ADMISSION:  Drainage from abdominal wall incision.  BRIEF HISTORY:  The patient is a 55 year old African American female who was admitted and underwent laparoscopic lysis of adhesions for 90 minutes, open lysis of adhesions for 30 minutes, serosal repair x2, enterotomy repair x2, small-bowel resection 30 cm, an open ventral hernia repair with underlay MTF biologic mesh 10 x 16 on September 12, 2010.  She went home on September 19, 2010.  She reports doing fairly well.  She came back on September 11 with a dry mouth and some shortness of breath and a CT scan was obtained, which showed no pulmonary embolus. She had some abdominal redness and a CT was obtained and showed air fluid collection and subcutaneous fat in the area of the prior hernia. There was also small bowel loop in the midabdomen under the mesh with stranding and wall thickening, but there was no obstruction.  The patient was apparently treated with IV clindamycin and discharged.  She returned this morning around 2 a.m.  She woke up and area in her midabdomen started draining a large volume of purulent material.  EMS was called and she was brought to the ER.  Here in the ER, she has an open area approximately 1.5 cm in diameter, which is open and purulent. It can be probed to about 2.5 to 3 inches.  It has drained fairly well, it is malodorous.  We were asked to see the patient.  PAST MEDICAL HISTORY: 1. Morbid obesity, she states her height is 61 inches, she weighs 281     pounds. 2. Hypertension. 3. Dyslipidemia. 4.  GERD.  PAST SURGICAL HISTORY:  She had a ventral hernia repair in 2007 by Dr. Lucia Gaskins and then repeat ventral hernia repair, small bowel resection, enterotomy and serosal repairs on September 12, 2010, Dr. Johney Maine.  FAMILY HISTORY:  No significant problems.  SOCIAL HISTORY:  No tobacco, alcohol, or drugs.  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  REVIEW OF SYSTEMS:  GENERAL:  She denies any fever.  No headaches, dizziness, syncope.  Her weight is unchanged.  SKIN:  The only change is redness on her abdomen.  CARDIAC:  No chest pain.  PULMONARY:  Positive for some shortness of breath.  PE was ruled out, September 27, 2010. GI:  Poor appetite, not eating much.  She complains her mouth being dry. No nausea, vomiting, diarrhea, or constipation.  GU:  Denies any urinary tract symptoms.  LOWER EXTREMITIES:  Positive for swelling.  CURRENT MEDICATIONS: 1. Hydrochlorothiazide 12.5 mg daily. 2. Lipitor 40 mg daily. 3. She takes Nexium p.r.n. 4. She was started on clindamycin, I do not have doses.  ALLERGIES:  None known.  PHYSICAL EXAMINATION:  GENERAL:  This is an obese [place] American female, in no acute  distress with an open drainage from her abdomen. VITAL SIGNS:  Temperature is 99.3, blood pressure is 143/73, heart rate is 92, respiratory rate is 20, saturations are 98% on room air. HEAD:  Normocephalic. EYES, EARS, NOSE AND THROAT:  Within normal limits. NECK:  Trachea is in midline.  Thyroid is nonpalpable.  No bruits.  No JVD. CHEST:  Clear to auscultation and percussion. CARDIAC:  No murmurs or rubs.  Normal S1 and S2.  She is slightly tachycardic. CHEST WALL:  Nontender. ABDOMEN:  Bowel sounds are present.  She is not distended.  She is tender around the site of the open drainage, which is about 1.5-2 cm in diameter with malodorous, purulent, bloody drainage. GU/RECTAL:  Deferred. MUSCULOSKELETAL:  She has venostasis changes on her lower extremities and mild  edema. NEUROLOGIC:  No focal deficits.  Cranial nerves are grossly intact. PSYCH:  Normal affect.  LABS:  Sodium is 135, potassium is 3.5, chloride is 94, CO2 is 25, BUN is 7, creatinine is 0.85, glucose is 92.  White count is 9200, hemoglobin 9.2, hematocrit is 29, platelet count is 404.  UA was negative on September 11.  Calcitonin was 0.13 on September 11.  BNP was 162 on September 11.  CT as noted above.  It shows air fluid collection, subcutaneous fat around prior hernia.  Small bowel; in the midabdomen underlying mesh shows stranding and wall thickening.  There was no obstruction.  CT of the chest showed no pulmonary embolism.  IMPRESSION: 1. Infected abdominal wound status post repair of incarcerated     periumbilical hernia. 2. Morbid obesity. 3. Hypertension. 4. Dyslipidemia. 5. Gastroesophageal reflux disease. 6. Anemia.  PLAN:  I am going to admit her, I am going to start her on Invanz, I am going to keep her n.p.o. and Dr. Brantley Stage will see her shortly and decide whether she needs to go to the OR for incision and drainage and further evaluation of her abdominal wound.     Lydia Guiles, P.A.   ______________________________ Joyice Faster Dunia Myers, M.D.    WDJ/MEDQ  D:  09/29/2010  T:  09/29/2010  Job:  [id number]  cc:   [facility]  Electronically Signed by Erroll Luna M.D. on 10/10/2010 08:30:38 AM

## 2010-10-12 ENCOUNTER — Encounter (INDEPENDENT_AMBULATORY_CARE_PROVIDER_SITE_OTHER): Payer: Self-pay | Admitting: Surgery

## 2010-10-12 ENCOUNTER — Encounter (INDEPENDENT_AMBULATORY_CARE_PROVIDER_SITE_OTHER): Payer: BC Managed Care – PPO | Admitting: Surgery

## 2010-10-12 ENCOUNTER — Ambulatory Visit (INDEPENDENT_AMBULATORY_CARE_PROVIDER_SITE_OTHER): Payer: BC Managed Care – PPO | Admitting: Surgery

## 2010-10-12 VITALS — BP 142/98 | HR 88 | Temp 96.9°F | Resp 20 | Ht 61.0 in | Wt 266.6 lb

## 2010-10-12 DIAGNOSIS — S31109A Unspecified open wound of abdominal wall, unspecified quadrant without penetration into peritoneal cavity, initial encounter: Secondary | ICD-10-CM | POA: Insufficient documentation

## 2010-10-12 DIAGNOSIS — K436 Other and unspecified ventral hernia with obstruction, without gangrene: Secondary | ICD-10-CM

## 2010-10-12 DIAGNOSIS — K430 Incisional hernia with obstruction, without gangrene: Secondary | ICD-10-CM

## 2010-10-12 NOTE — Progress Notes (Signed)
Subjective:     Patient ID: [name], female   DOB: 05-14-1955, 55 y.o.   MRN: [id number]  HPI  Patient Care Team: Jenny Reichmann as PCP - General (Family Medicine)  This patient is a 55 y.o.female who presents today for surgical evaluation.   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  Procedure: Emergent laparoscopic and open reduction and repair of hernia with biologic mesh & small bowel resection, August 2012.   Reason for visit: Followup.  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 Lucia Gaskins. She developed recurrence. He was working try and see if she could lose some weight before considering a laparoscopic repair when she came in with an incarcerated hernia and bowel obstruction. She required emergent surgery. She was the hospital for some time but has been out of the hospital for 3 days.  She then developed drainage from the incision. She was admitted and placed on IV antibiotics last week. She has an open wound in the upper part of the incision. They have been packing it 3 times a day. Minimal drainage. She was sent home on antibiotics but has only been taking about once a day since her mouth is been dry with it.   She patient comes today with her sister. She notes her appetite better, tolerating most foods. Pain is going down. No hematochezia hematemesis nor melena. No fevers chills or sweats. Energy level is down but improving. She notes her legs are less swollen.     Past Medical History  Diagnosis Date  . Hyperlipidemia   . Hypertension   . Leg swelling   . Abdominal distention   . Abdominal pain     Past Surgical History  Procedure Date  . Bowel obstruction 09/12/2010  . Small intestine surgery 09/12/10  . Hernia repair 2007    incarcerated, open with biologic mesh  . Hernia repair [id number]    strangulated,  redo open with biologic mesh    History   Social History  . Marital Status: Widowed    Spouse Name: N/A    Number of Children: N/A  . Years of Education: N/A   Occupational History  . Not on file.   Social History Main Topics  . Smoking status: Never Smoker   . Smokeless tobacco: Not on file  . Alcohol Use: No  . Drug Use:   . Sexually Active:    Other Topics Concern  . Not on file   Social History Narrative  . No narrative on file    Family History  Problem Relation Age of Onset  . Heart disease Mother     heart attack   . Heart disease Father     heart attack     Current outpatient prescriptions:atorvastatin (LIPITOR) 40 MG tablet, Take 40 mg by mouth daily.  , Disp: , Rfl: ;  hydrochlorothiazide (HYDRODIURIL) 12.5 MG tablet, Take 25 mg by mouth daily. , Disp: , Rfl: ;  oxyCODONE-acetaminophen (PERCOCET) 5-325 MG per tablet, , Disp: , Rfl:   No Known Allergies      Review of Systems  Constitutional: Negative for fever, chills, diaphoresis, appetite change and fatigue.  HENT: Negative for ear pain, sore throat, trouble swallowing, neck pain and ear discharge.   Eyes: Negative for photophobia, discharge and visual disturbance.  Respiratory: Negative for cough, choking, chest tightness and shortness of breath.   Cardiovascular: Positive  for leg swelling. Negative for chest pain and palpitations.  Gastrointestinal: Positive for abdominal pain. Negative for nausea, vomiting, diarrhea, constipation, anal bleeding and rectal pain.  Genitourinary: Negative for dysuria, frequency and difficulty urinating.  Musculoskeletal: Negative for myalgias and gait problem.  Skin: Negative for color change, pallor and rash.  Neurological: Negative for dizziness, speech difficulty, weakness and numbness.  Hematological: Negative for adenopathy.  Psychiatric/Behavioral: Negative for confusion and agitation. The patient is not nervous/anxious.        Objective:   Physical Exam    Constitutional: She is oriented to person, place, and time. She appears well-developed and well-nourished. No distress.  HENT:  Head: Normocephalic.  Mouth/Throat: Oropharynx is clear and moist. No oropharyngeal exudate.  Eyes: Conjunctivae and EOM are normal. Pupils are equal, round, and reactive to light. No scleral icterus.  Neck: Normal range of motion. Neck supple. No tracheal deviation present.  Cardiovascular: Normal rate, regular rhythm and intact distal pulses.   Pulmonary/Chest: Effort normal and breath sounds normal. No respiratory distress. She exhibits no tenderness.  Abdominal: Soft. She exhibits no distension. There is no rebound and no guarding. Hernia confirmed negative in the right inguinal area and confirmed negative in the left inguinal area.       Midline umb incision with 1x0.5 x2cm deep well granulated wound.  Lap incisions closed.    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  No cellulitis  Genitourinary: No vaginal discharge found.  Musculoskeletal: Normal range of motion. She exhibits no tenderness.       BLE mod 2+ edema to calf  Lymphadenopathy:    She has no cervical adenopathy.       Right: No inguinal adenopathy present.       Left: No inguinal adenopathy present.  Neurological: She is alert and oriented to person, place, and time. No cranial nerve deficit. She exhibits normal muscle tone. Coordination normal.  Skin: Skin is warm and dry. No rash noted. She is not diaphoretic. No erythema.  Psychiatric: She has a normal mood and affect. Her behavior is normal. Judgment and thought content normal.       Assessment:     Recurrent incarcerated ventral hernia with some strangulation causing bowel obstruction  requiring emergent repair and resection. Gradually recovering with open wound.    Plan:     Considering what she's been through, I think she's recovering relatively well. I stressed that nutrition and exercise are the key to recovering.  OK to drive.  RTW in 2  weeks.  I would like to see her in 2 more weeks to make she she's getting stronger. Hopefully with the larger piece of biologic mesh she can avoid a repeat surgery. If she does recur, she could get benefit with panniculectomy concurrent with her hernia to minimize chance of another recurrence. However, that is a much larger operation with increased morbidity and wound problems.  I strongly recommend she followup with her primary care physician to make sure that she has or other health issues undressed and follow closely given the stress of an emergent operation and 2 hospital stays. She and her sister expressed understanding and appreciation.

## 2010-10-12 NOTE — Patient Instructions (Addendum)
Pack wound daily only.  Stop all antibiotics.  Follow-up with your Primary Care MD for other health issues, especially leg swelling.  OK to drive.  Return to work unrestricted, Monday, 08October2012

## 2010-10-17 NOTE — Discharge Summary (Signed)
  NAMEBEAUX, Virginia             ACCOUNT NO.:  [id number]  MEDICAL RECORD NO.:  [id number]  LOCATION:  [id number]                         FACILITY:  [facility]  PHYSICIAN:  Marland Kitchen T. Jacub Waiters, M.D.DATE OF BIRTH:  1955-05-29  DATE OF ADMISSION:  09/12/2010 DATE OF DISCHARGE:  09/19/2010                              DISCHARGE SUMMARY   ADMITTING PHYSICIAN:  Adin Hector, MD.  DISCHARGING PHYSICIAN:  Darene Lamer. Gerturde Kuba, MD.  PROCEDURES:  Laparoscopic lysis of adhesions, open lysis of adhesions, serosal repair x2, enterotomy repair x1, small bowel resection times 30 cm, open ventral hernia repair with mesh, underlay MTF biologic mesh.  CONSULTANTS:  None.  REASON FOR ADMISSION:  [name] is a 55 year old morbidly obese female, who noted periumbilical abdominal pain approximately 2 days ago. She went to Urgent Care the date of admission.  They did a workup and they were concerned about an incarcerated hernia.  She was recommended to come to the emergency department.  Upon arrival, she finally got a CT scan which revealed an incarcerated ventral hernia with bowel present. Please see admitting history and physical for further details.  ADMITTING DIAGNOSES: 1. Incarcerated ventral hernia with a bowel obstruction. 2. Morbid obesity. 3. Hypertension. 4. Hypercholesterolemia.  HOSPITAL COURSE:  At this time, the patient was emergently taken to the operating room where she underwent a laparoscopic converted to open ventral hernia repair with mesh as well as a bowel resection. Postoperatively, the patient did quite well.  She initially had an NG tube present with an ileus.  By postoperative day #3, the patient was having very minimal output from her NG tube.  It had been clamped on postoperative day #2 due to decreased output.  The patient was passing a little bit of flatus, and at this point her NG tube was felt safe for discontinuation.  She was given a few sips of clear liquids.   Over the next several days, her diet was able to be advanced as tolerated.  Her JP drain was left in place, I believe at time of discharge.  Her abdomen was stable.  Her incision was clean, dry, and intact, and her pain was appropriate.  At this time, on postoperative day #7, the patient was otherwise doing well until stable for discharge home.  DISCHARGE DIAGNOSES: 1. Incarcerated ventral hernia status post laparoscopic converted to     open ventral hernia repair with MTF mesh. 2. Small bowel obstruction, resolved after surgical intervention. 3. Hypertension. 4. Hypercholesterolemia.  DISCHARGE MEDICATIONS: 1. Percocet 5/325 2. Aleve. 3. Hydrochlorothiazide 12.5 mg. 4. Lipitor. 5. Nexium.  DISCHARGE INSTRUCTIONS:  The patient may increase her activity slowly and walk up steps.  She may shower.  She is not to do any heavy lifting for 4 weeks.  She is to empty her drain daily and record her output. She is to follow up with Dr. Johney Maine within a week.     Henreitta Cea, PA   ______________________________Benjamin T. Younes Degeorge, M.D.    KEO/MEDQ  D:  10/10/2010  T:  10/10/2010  Job:  [id number]  Electronically Signed by Excell Seltzer M.D. on 10/17/2010 01:15:01 PM

## 2010-10-20 ENCOUNTER — Telehealth (INDEPENDENT_AMBULATORY_CARE_PROVIDER_SITE_OTHER): Payer: Self-pay | Admitting: General Surgery

## 2010-10-20 NOTE — Telephone Encounter (Signed)
Patient called and left voicemail re: her RTW date. Patient is due to return to work on 10/24/10 per Dr Clyda Greener last office note. Patient states she is still extremely exhausted and at work she has to stand all day and she doesn't think she can do this yet. She would like to see and speak with Dr Johney Maine first prior to going back to work. She has an appt next Wednesday, October 26, 2010. I will write her a note to keep her out until she sees him next week. Patient will pick up from the office.

## 2010-10-26 ENCOUNTER — Ambulatory Visit (INDEPENDENT_AMBULATORY_CARE_PROVIDER_SITE_OTHER): Payer: BC Managed Care – PPO | Admitting: Surgery

## 2010-10-26 VITALS — BP 154/102 | HR 70 | Temp 96.8°F | Resp 16 | Ht 61.0 in | Wt 269.2 lb

## 2010-10-26 DIAGNOSIS — S31109A Unspecified open wound of abdominal wall, unspecified quadrant without penetration into peritoneal cavity, initial encounter: Secondary | ICD-10-CM

## 2010-10-26 DIAGNOSIS — K430 Incisional hernia with obstruction, without gangrene: Secondary | ICD-10-CM

## 2010-10-26 DIAGNOSIS — K436 Other and unspecified ventral hernia with obstruction, without gangrene: Secondary | ICD-10-CM

## 2010-10-26 NOTE — Patient Instructions (Signed)
How to [place] Follow your doctor's instructions for how often to change the dressing. You will be told to change your dressing either:  Once a day.   2 times a day.   3 times a day.  WHAT TO DO BEFORE YOU BEGIN  Wash your hands with soap and water. Then dry your hands with a clean towel.   Get all your supplies together. Things you will need include:   Normal saline   Bandage ([place])   Antibiotic ointment   Tape   Gloves   ABD pads   Gauze   Plastic bags   NU - gauze (ribbon gauze 1/4 inch size)  If you need pain medicine before your dressing change, take it 30 minutes before you start the dressing change.   Take your shower before you do the first dressing change of the day.  FOLLOW THESE STEPS TO CHANGE THE DRESSING  Take off the old dressing. Put it in a plastic bag. Wash your hands. If someone helps you take off your old dressing, they should also wash their hands.   Open the supplies in front of you:   Take the cap off the saline if you are using saline.   Open the gauze. Keep it lying on the inside of the package.   Put on your gloves.   Clean your wound as you were shown by your doctor.   If you have been told to keep your wound dry, follow those instructions. Your doctor will tell you to do one or more of the following:   Pick up the gauze. With the other hand, pour the saline over the gauze. Squeeze out the extra saline.   Put antibiotic ointment or other medicine on your wound if you have been told to do so.   Put saline soaked gauze only in your wound, not on the skin around it.   Pack your wound loosely, as your doctor taught you.   Put dry gauze on your wound.   Put ABD pads over the dry gauze if your dressings soak through.   Take your gloves off. Put them in the plastic bag with the old dressing.   Tape the dressing in place so it will not fall off. Do not wrap the tape completely around your arm or leg.   Wrap  the dressing with Doreene Nest if you have been told to do so.   Tie the plastic bag shut and put it in the trash. Then wash your hands again.   Keep this dressing clean and dry until your next dressing change.  GET HELP IF:  The skin around your wound has a red rash that itches and burns.   You see black or yellow flesh in your wound that was not there before.   You feel sick to your stomach (nausea), throw up, and feel very tired.   You have a fever of 100.64F (38.1C) or higher.  GET HELP RIGHT AWAY IF:  Your wound looks red.   Your wound feels more tender or sore.   You see green or yellow liquid (pus) in the wound.   The wound has a bad smell.   You have a fever of 102F (38.9C) or higher.  Easy-to-Read style based on content from [place], [place] Document Released: 03/31/2008 Document Re-Released: 03/29/2009 [facility] Patient Information 2011 [place].

## 2010-10-26 NOTE — Progress Notes (Signed)
Subjective:     Patient ID: [name], female   DOB: 12-23-1955, 55 y.o.   MRN: [id number]  HPI  Patient Care Team: Jenny Reichmann as PCP - General (Family Medicine)  This patient is a 55 y.o.female who presents today for surgical evaluation.   Patient comes today doing okay. She still gets drainage from the wound. She's packing it with a little 4 x 4 gauze. She wants to get back to work, however they told her she needs an updated form. Eating well & soreness going down.  Past Medical History  Diagnosis Date  . Hyperlipidemia   . Hypertension   . Leg swelling   . Abdominal distention   . Abdominal pain   . Hernia     Past Surgical History  Procedure Date  . Bowel obstruction 09/12/2010  . Hernia repair 2007    incarcerated, open with biologic mesh  . Hernia repair [id number]    strangulated, redo open with biologic mesh  . Small intestine surgery 09/12/10    SBR in incarcerated [place]    History   Social History  . Marital Status: Widowed    Spouse Name: N/A    Number of Children: N/A  . Years of Education: N/A   Occupational History  . Not on file.   Social History Main Topics  . Smoking status: Never Smoker   . Smokeless tobacco: Not on file  . Alcohol Use: No  . Drug Use: No  . Sexually Active:    Other Topics Concern  . Not on file   Social History Narrative  . No narrative on file    Family History  Problem Relation Age of Onset  . Heart disease Mother     heart attack   . Heart disease Father     heart attack     Current outpatient prescriptions:acetaminophen (TYLENOL) 325 MG tablet, Take 650 mg by mouth every 6 (six) hours as needed.  , Disp: , Rfl: ;  atorvastatin (LIPITOR) 40 MG tablet, Take 40 mg by mouth daily.  , Disp: , Rfl: ;  ferrous sulfate 325 (65 FE) MG tablet, Take 325 mg by mouth 2 (two) times daily.  , Disp: , Rfl: ;  hydrochlorothiazide (HYDRODIURIL) 12.5 MG tablet, Take 25 mg by mouth daily. , Disp: , Rfl:  Multiple Vitamin  (MULTIVITAMIN) capsule, Take 1 capsule by mouth daily.  , Disp: , Rfl: ;  naproxen sodium (ANAPROX) 220 MG tablet, Take 220 mg by mouth 2 (two) times daily with a meal.  , Disp: , Rfl:   No Known Allergies     Review of Systems  Constitutional: Negative for fever, chills and diaphoresis.  HENT: Negative for ear pain, sore throat and trouble swallowing.   Eyes: Negative for photophobia and visual disturbance.  Respiratory: Negative for cough and choking.   Cardiovascular: Negative for chest pain and palpitations.  Gastrointestinal: Negative for nausea, vomiting, abdominal pain, diarrhea, constipation, anal bleeding and rectal pain.  Genitourinary: Negative for dysuria, frequency and difficulty urinating.  Musculoskeletal: Negative for myalgias and gait problem.  Skin: Positive for wound. Negative for color change, pallor and rash.  Neurological: Negative for dizziness, speech difficulty, weakness and numbness.  Hematological: Negative for adenopathy.  Psychiatric/Behavioral: Negative for confusion and agitation. The patient is not nervous/anxious.        Objective:   Physical Exam  Constitutional: She is oriented to person, place, and time. She appears well-developed and well-nourished. No distress.  HENT:  Head:  Normocephalic.  Mouth/Throat: Oropharynx is clear and moist. No oropharyngeal exudate.  Eyes: Conjunctivae and EOM are normal. Pupils are equal, round, and reactive to light. No scleral icterus.  Neck: Normal range of motion. Neck supple. No tracheal deviation present.  Cardiovascular: Normal rate, regular rhythm and intact distal pulses.   Pulmonary/Chest: Effort normal and breath sounds normal. No respiratory distress. She exhibits no tenderness.  Abdominal: Soft. She exhibits no distension and no mass. There is no tenderness. Hernia confirmed negative in the right inguinal area and confirmed negative in the left inguinal area.       20mm opening tracts 5cm deep.   Cauterized the tract & packed w Nugauze  Genitourinary: No vaginal discharge found.  Musculoskeletal: Normal range of motion. She exhibits no tenderness.  Lymphadenopathy:    She has no cervical adenopathy.       Right: No inguinal adenopathy present.       Left: No inguinal adenopathy present.  Neurological: She is alert and oriented to person, place, and time. No cranial nerve deficit. She exhibits normal muscle tone. Coordination normal.  Skin: Skin is warm and dry. No rash noted. She is not diaphoretic. No erythema.  Psychiatric: She has a normal mood and affect. Her behavior is normal. Judgment and thought content normal.       Assessment:     Slowly healing wound in a morbidly obese female with recurrent incarcerated ventral hernia status post repair    Plan:     Switch to ribbon-type NU gauze packing.  Okay to return to work. Note was written for next Monday 15Oct.  Return to clinic 3 weeks for wound check.

## 2010-11-09 ENCOUNTER — Encounter (INDEPENDENT_AMBULATORY_CARE_PROVIDER_SITE_OTHER): Payer: Self-pay | Admitting: Surgery

## 2010-11-09 ENCOUNTER — Ambulatory Visit (INDEPENDENT_AMBULATORY_CARE_PROVIDER_SITE_OTHER): Payer: BC Managed Care – PPO | Admitting: Surgery

## 2010-11-09 VITALS — BP 146/94 | HR 80 | Temp 96.8°F | Resp 16 | Ht 61.0 in | Wt 270.4 lb

## 2010-11-09 DIAGNOSIS — K430 Incisional hernia with obstruction, without gangrene: Secondary | ICD-10-CM

## 2010-11-09 DIAGNOSIS — S31109A Unspecified open wound of abdominal wall, unspecified quadrant without penetration into peritoneal cavity, initial encounter: Secondary | ICD-10-CM

## 2010-11-09 DIAGNOSIS — K436 Other and unspecified ventral hernia with obstruction, without gangrene: Secondary | ICD-10-CM

## 2010-11-09 NOTE — Progress Notes (Signed)
Subjective:     Patient ID: [name], female   DOB: Apr 08, 1955, 55 y.o.   MRN: [id number]  HPI  Patient Care Team: Jenny Reichmann as PCP - General (Family Medicine)  This patient is a 55 y.o.female who presents today for surgical evaluation.    Diagnosis: Incarcerated recurrent ventral incisional hernia.  Procedure: Small bowel resection and repair with biologic underlay 09/12/2010  The patient comes into our office today feeling better. Doing dressing changes daily. Back to work. Eating well. No nausea or vomiting. Mild soreness and pulls but nearly fainted away. Overall improved  Past Medical History  Diagnosis Date  . Hyperlipidemia   . Hypertension   . Leg swelling   . Abdominal distention   . Abdominal pain   . Hernia     Past Surgical History  Procedure Date  . Bowel obstruction 09/12/2010  . Hernia repair 2007    incarcerated, open with biologic mesh  . Hernia repair [id number]    strangulated, redo open with biologic mesh  . Small intestine surgery 09/12/10    SBR in incarcerated [place]    History   Social History  . Marital Status: Widowed    Spouse Name: N/A    Number of Children: N/A  . Years of Education: N/A   Occupational History  . Not on file.   Social History Main Topics  . Smoking status: Never Smoker   . Smokeless tobacco: Never Used  . Alcohol Use: No  . Drug Use: No  . Sexually Active: Not on file   Other Topics Concern  . Not on file   Social History Narrative  . No narrative on file    Family History  Problem Relation Age of Onset  . Heart disease Mother     heart attack   . Heart disease Father     heart attack     Current outpatient prescriptions:acetaminophen (TYLENOL) 325 MG tablet, Take 650 mg by mouth every 6 (six) hours as needed.  , Disp: , Rfl: ;  atorvastatin (LIPITOR) 40 MG tablet, Take 40 mg by mouth daily.  , Disp: , Rfl: ;  ferrous sulfate 325 (65 FE) MG tablet, Take 325 mg by mouth 2 (two) times daily.  , Disp: ,  Rfl: ;  hydrochlorothiazide (HYDRODIURIL) 12.5 MG tablet, Take 25 mg by mouth daily. , Disp: , Rfl:  Multiple Vitamin (MULTIVITAMIN) capsule, Take 1 capsule by mouth daily.  , Disp: , Rfl: ;  naproxen sodium (ANAPROX) 220 MG tablet, Take 220 mg by mouth 2 (two) times daily with a meal.  , Disp: , Rfl:   No Known Allergies     Review of Systems  Constitutional: Negative for fever, chills and diaphoresis.  HENT: Negative for ear pain, sore throat and trouble swallowing.   Eyes: Negative for photophobia and visual disturbance.  Respiratory: Negative for cough and choking.   Cardiovascular: Negative for chest pain and palpitations.  Gastrointestinal: Negative for nausea, vomiting, abdominal pain, diarrhea, constipation, anal bleeding and rectal pain.  Genitourinary: Negative for dysuria, frequency and difficulty urinating.  Musculoskeletal: Negative for myalgias and gait problem.  Skin: Negative for color change, pallor and rash.  Neurological: Negative for dizziness, speech difficulty, weakness and numbness.  Hematological: Negative for adenopathy.  Psychiatric/Behavioral: Negative for confusion and agitation. The patient is not nervous/anxious.        Objective:   Physical Exam  Constitutional: She is oriented to person, place, and time. She appears well-developed and well-nourished. No  distress.  HENT:  Head: Normocephalic.  Mouth/Throat: Oropharynx is clear and moist. No oropharyngeal exudate.  Eyes: Conjunctivae and EOM are normal. Pupils are equal, round, and reactive to light. No scleral icterus.  Neck: Normal range of motion. No tracheal deviation present.  Cardiovascular: Normal rate and intact distal pulses.   Pulmonary/Chest: Effort normal. No respiratory distress. She exhibits no tenderness.  Abdominal: Soft. She exhibits no distension and no mass. There is no tenderness. There is no guarding. Hernia confirmed negative in the right inguinal area and confirmed negative in the  left inguinal area.       1cm open wound, 1.5cm deep.  Great granulation.  Giant panniculus unchanged  Genitourinary: No vaginal discharge found.  Musculoskeletal: Normal range of motion. She exhibits no tenderness.  Lymphadenopathy:       Right: No inguinal adenopathy present.       Left: No inguinal adenopathy present.  Neurological: She is alert and oriented to person, place, and time. No cranial nerve deficit. She exhibits normal muscle tone. Coordination normal.  Skin: Skin is warm and dry. No rash noted. She is not diaphoretic.  Psychiatric: She has a normal mood and affect. Her behavior is normal.       Assessment:    Almost 2 months s/p emergent [place] repair with Sb resection.  Improving      Plan:     Increase activity as tolerated.  Do not push through pain.  Advanced on diet as tolerated. Bowel regimen to avoid problems.  Return to clinic 3 weeks to check wound.  If closes up by then, may follow up p.r.n. The patient expressed understanding and appreciation

## 2010-11-30 ENCOUNTER — Encounter (INDEPENDENT_AMBULATORY_CARE_PROVIDER_SITE_OTHER): Payer: BC Managed Care – PPO | Admitting: Surgery

## 2010-12-13 ENCOUNTER — Encounter (INDEPENDENT_AMBULATORY_CARE_PROVIDER_SITE_OTHER): Payer: Self-pay | Admitting: Surgery

## 2010-12-13 ENCOUNTER — Ambulatory Visit (INDEPENDENT_AMBULATORY_CARE_PROVIDER_SITE_OTHER): Payer: BC Managed Care – PPO | Admitting: Surgery

## 2010-12-13 VITALS — BP 160/96 | HR 68 | Temp 97.8°F | Resp 18 | Ht 61.0 in | Wt 265.2 lb

## 2010-12-13 DIAGNOSIS — K436 Other and unspecified ventral hernia with obstruction, without gangrene: Secondary | ICD-10-CM

## 2010-12-13 DIAGNOSIS — K430 Incisional hernia with obstruction, without gangrene: Secondary | ICD-10-CM

## 2010-12-13 DIAGNOSIS — S31109A Unspecified open wound of abdominal wall, unspecified quadrant without penetration into peritoneal cavity, initial encounter: Secondary | ICD-10-CM

## 2010-12-13 NOTE — Progress Notes (Signed)
Subjective:     Patient ID: [name], female   DOB: Oct 30, 1955, 55 y.o.   MRN: [id number]  HPI   Patient Care Team: Jenny Reichmann as PCP - General (Family Medicine)  This patient is a 55 y.o.female who presents today for surgical evaluation.    Diagnosis: Incarcerated recurrent ventral incisional hernia.  Procedure: Small bowel resection and open [place] repair with biologic underlay 09/12/2010  The patient comes into our office today feeling better. The wound has closed.  Tired at the end of the day, but working . Eating well. No nausea or vomiting. Doing Zumba exercises with family - lost 5 lbs in past week.  Wants to lose 50lb more over the next year.. Overall improved  Past Medical History  Diagnosis Date  . Hyperlipidemia   . Hypertension   . Leg swelling   . Abdominal distention   . Abdominal pain   . Hernia     Past Surgical History  Procedure Date  . Bowel obstruction 09/12/2010  . Hernia repair 2007    incarcerated, open with biologic mesh  . Hernia repair [id number]    strangulated, redo open with biologic mesh  . Small intestine surgery 09/12/10    SBR in incarcerated [place]    History   Social History  . Marital Status: Widowed    Spouse Name: N/A    Number of Children: N/A  . Years of Education: N/A   Occupational History  . Not on file.   Social History Main Topics  . Smoking status: Never Smoker   . Smokeless tobacco: Never Used  . Alcohol Use: No  . Drug Use: No  . Sexually Active: Not on file   Other Topics Concern  . Not on file   Social History Narrative  . No narrative on file    Family History  Problem Relation Age of Onset  . Heart disease Mother     heart attack   . Heart disease Father     heart attack     Current outpatient prescriptions:acetaminophen (TYLENOL) 325 MG tablet, Take 650 mg by mouth every 6 (six) hours as needed.  , Disp: , Rfl: ;  atorvastatin (LIPITOR) 40 MG tablet, Take 40 mg by mouth daily.  , Disp: , Rfl: ;   ferrous sulfate 325 (65 FE) MG tablet, Take 325 mg by mouth 2 (two) times daily.  , Disp: , Rfl: ;  hydrochlorothiazide (HYDRODIURIL) 12.5 MG tablet, Take 25 mg by mouth daily. , Disp: , Rfl:  Multiple Vitamin (MULTIVITAMIN) capsule, Take 1 capsule by mouth daily.  , Disp: , Rfl: ;  naproxen sodium (ANAPROX) 220 MG tablet, Take 220 mg by mouth 2 (two) times daily with a meal.  , Disp: , Rfl:   No Known Allergies     Review of Systems  Constitutional: Negative for fever, chills and diaphoresis.  HENT: Negative for ear pain, sore throat and trouble swallowing.   Eyes: Negative for photophobia and visual disturbance.  Respiratory: Negative for cough and choking.   Cardiovascular: Negative for chest pain and palpitations.  Gastrointestinal: Negative for nausea, vomiting, abdominal pain, diarrhea, constipation, anal bleeding and rectal pain.  Genitourinary: Negative for dysuria, frequency and difficulty urinating.  Musculoskeletal: Negative for myalgias and gait problem.  Skin: Negative for color change, pallor and rash.  Neurological: Negative for dizziness, speech difficulty, weakness and numbness.  Hematological: Negative for adenopathy.  Psychiatric/Behavioral: Negative for confusion and agitation. The patient is not nervous/anxious.  Objective:   Physical Exam  Constitutional: She is oriented to person, place, and time. She appears well-developed and well-nourished. No distress.  HENT:  Head: Normocephalic.  Mouth/Throat: Oropharynx is clear and moist. No oropharyngeal exudate.  Eyes: Conjunctivae and EOM are normal. Pupils are equal, round, and reactive to light. No scleral icterus.  Neck: Normal range of motion. No tracheal deviation present.  Cardiovascular: Normal rate and intact distal pulses.   Pulmonary/Chest: Effort normal. No respiratory distress. She exhibits no tenderness.  Abdominal: Soft. She exhibits no distension and no mass. There is no tenderness. There is no  guarding. Hernia confirmed negative in the right inguinal area and confirmed negative in the left inguinal area.       Incisions closed.  No new hernias  Giant panniculus unchanged  Genitourinary: No vaginal discharge found.  Musculoskeletal: Normal range of motion. She exhibits no tenderness.  Lymphadenopathy:       Right: No inguinal adenopathy present.       Left: No inguinal adenopathy present.  Neurological: She is alert and oriented to person, place, and time. No cranial nerve deficit. She exhibits normal muscle tone. Coordination normal.  Skin: Skin is warm and dry. No rash noted. She is not diaphoretic.  Psychiatric: She has a normal mood and affect. Her behavior is normal.       Assessment:    3 months s/p emergent [place] repair with small bowel resection.  Improving well      Plan:     Increase activity as tolerated.  Zumba a great idea as losing weight will help reduce recurrence risk.  Do not push through pain.  Advanced on diet as tolerated. Bowel regimen to avoid problems.  May follow up p.r.n. The patient gave me a hug and expressed understanding and appreciation.

## 2011-01-12 ENCOUNTER — Ambulatory Visit (INDEPENDENT_AMBULATORY_CARE_PROVIDER_SITE_OTHER): Payer: BC Managed Care – PPO

## 2011-01-12 DIAGNOSIS — E669 Obesity, unspecified: Secondary | ICD-10-CM

## 2011-01-12 DIAGNOSIS — R1084 Generalized abdominal pain: Secondary | ICD-10-CM

## 2011-01-12 DIAGNOSIS — D649 Anemia, unspecified: Secondary | ICD-10-CM

## 2011-01-12 DIAGNOSIS — I10 Essential (primary) hypertension: Secondary | ICD-10-CM

## 2011-01-23 ENCOUNTER — Ambulatory Visit (INDEPENDENT_AMBULATORY_CARE_PROVIDER_SITE_OTHER): Payer: BC Managed Care – PPO

## 2011-01-23 DIAGNOSIS — R5381 Other malaise: Secondary | ICD-10-CM

## 2011-01-23 DIAGNOSIS — I10 Essential (primary) hypertension: Secondary | ICD-10-CM

## 2011-01-23 DIAGNOSIS — D649 Anemia, unspecified: Secondary | ICD-10-CM

## 2011-01-23 DIAGNOSIS — E669 Obesity, unspecified: Secondary | ICD-10-CM

## 2011-01-23 DIAGNOSIS — Z833 Family history of diabetes mellitus: Secondary | ICD-10-CM

## 2011-01-23 DIAGNOSIS — R5383 Other fatigue: Secondary | ICD-10-CM

## 2011-01-27 ENCOUNTER — Other Ambulatory Visit: Payer: Self-pay | Admitting: Emergency Medicine

## 2011-01-27 ENCOUNTER — Ambulatory Visit
Admission: RE | Admit: 2011-01-27 | Discharge: 2011-01-27 | Disposition: A | Discharge status: Home / Self Care | Payer: BC Managed Care – PPO | Source: Ambulatory Visit | Attending: Emergency Medicine | Admitting: Emergency Medicine

## 2011-01-27 ENCOUNTER — Other Ambulatory Visit: Payer: BC Managed Care – PPO

## 2011-01-27 DIAGNOSIS — R7989 Other specified abnormal findings of blood chemistry: Secondary | ICD-10-CM

## 2011-01-27 DIAGNOSIS — R5382 Chronic fatigue, unspecified: Secondary | ICD-10-CM

## 2011-01-30 ENCOUNTER — Encounter (INDEPENDENT_AMBULATORY_CARE_PROVIDER_SITE_OTHER): Payer: BC Managed Care – PPO | Admitting: Surgery

## 2011-01-30 ENCOUNTER — Other Ambulatory Visit: Payer: Self-pay | Admitting: Emergency Medicine

## 2011-01-30 DIAGNOSIS — R5383 Other fatigue: Secondary | ICD-10-CM

## 2011-02-01 ENCOUNTER — Other Ambulatory Visit: Payer: BC Managed Care – PPO

## 2011-02-06 ENCOUNTER — Other Ambulatory Visit: Payer: BC Managed Care – PPO

## 2011-02-06 ENCOUNTER — Ambulatory Visit
Admission: RE | Admit: 2011-02-06 | Discharge: 2011-02-06 | Disposition: A | Discharge status: Home / Self Care | Payer: BC Managed Care – PPO | Source: Ambulatory Visit | Attending: Emergency Medicine | Admitting: Emergency Medicine

## 2011-02-06 DIAGNOSIS — R5383 Other fatigue: Secondary | ICD-10-CM

## 2011-02-13 ENCOUNTER — Encounter (INDEPENDENT_AMBULATORY_CARE_PROVIDER_SITE_OTHER): Payer: BC Managed Care – PPO | Admitting: Surgery

## 2011-02-21 ENCOUNTER — Encounter (INDEPENDENT_AMBULATORY_CARE_PROVIDER_SITE_OTHER): Payer: Self-pay

## 2011-03-20 ENCOUNTER — Ambulatory Visit (INDEPENDENT_AMBULATORY_CARE_PROVIDER_SITE_OTHER): Payer: BC Managed Care – PPO | Admitting: Internal Medicine

## 2011-03-20 VITALS — BP 159/82 | HR 105 | Temp 99.5°F | Resp 16 | Ht 60.75 in | Wt 251.2 lb

## 2011-03-20 DIAGNOSIS — R059 Cough, unspecified: Secondary | ICD-10-CM

## 2011-03-20 DIAGNOSIS — R05 Cough: Secondary | ICD-10-CM

## 2011-03-20 DIAGNOSIS — I10 Essential (primary) hypertension: Secondary | ICD-10-CM

## 2011-03-20 DIAGNOSIS — J209 Acute bronchitis, unspecified: Secondary | ICD-10-CM

## 2011-03-20 LAB — BASIC METABOLIC PANEL
BUN: 15 mg/dL (ref 6–23)
CO2: 24 mEq/L (ref 19–32)
Chloride: 104 mEq/L (ref 96–112)
Creat: 1.18 mg/dL — ABNORMAL HIGH (ref 0.50–1.10)
Glucose, Bld: 91 mg/dL (ref 70–99)

## 2011-03-20 LAB — POCT CBC
Hemoglobin: 11.5 g/dL — AB (ref 12.2–16.2)
MCH, POC: 24.1 pg — AB (ref 27–31.2)
MCV: 77.6 fL — AB (ref 80–97)
RBC: 4.77 M/uL (ref 4.04–5.48)
WBC: 6.7 10*3/uL (ref 4.6–10.2)

## 2011-03-20 MED ORDER — AMLODIPINE BESYLATE 5 MG PO TABS: 5.0000 mg | ORAL_TABLET | Freq: Every day | ORAL | Status: DC

## 2011-03-20 MED ORDER — BENZONATATE 100 MG PO CAPS: 100.0000 mg | ORAL_CAPSULE | Freq: Three times a day (TID) | ORAL | Status: AC | PRN

## 2011-03-20 MED ORDER — AZITHROMYCIN 500 MG PO TABS: 500.0000 mg | ORAL_TABLET | Freq: Every day | ORAL | Status: AC

## 2011-03-20 NOTE — Patient Instructions (Signed)
Take all of your azithromycin and use the Tessalon Perles to help with your cough.  Drink extra fluids and get some rest.  Take the amlodipine along with your HCTZ for blood pressure.  Please RTC in 4-6 months for a recheck.  Bronchitis Bronchitis is the body's way of reacting to injury and/or infection (inflammation) of the bronchi. Bronchi are the air tubes that extend from the windpipe into the lungs. If the inflammation becomes severe, it may cause shortness of breath. CAUSES  Inflammation may be caused by:  A virus.   Germs (bacteria).   Dust.   Allergens.   Pollutants and many other irritants.  The cells lining the bronchial tree are covered with tiny hairs (cilia). These constantly beat upward, away from the lungs, toward the mouth. This keeps the lungs free of pollutants. When these cells become too irritated and are unable to do their job, mucus begins to develop. This causes the characteristic cough of bronchitis. The cough clears the lungs when the cilia are unable to do their job. Without either of these protective mechanisms, the mucus would settle in the lungs. Then you would develop pneumonia. Smoking is a common cause of bronchitis and can contribute to pneumonia. Stopping this habit is the single most important thing you can do to help yourself. TREATMENT   Your caregiver may prescribe an antibiotic if the cough is caused by bacteria. Also, medicines that open up your airways make it easier to breathe. Your caregiver may also recommend or prescribe an expectorant. It will loosen the mucus to be coughed up. Only take over-the-counter or prescription medicines for pain, discomfort, or fever as directed by your caregiver.   Removing whatever causes the problem (smoking, for example) is critical to preventing the problem from getting worse.   Cough suppressants may be prescribed for relief of cough symptoms.   Inhaled medicines may be prescribed to help with symptoms now and to  help prevent problems from returning.   For those with recurrent (chronic) bronchitis, there may be a need for steroid medicines.  SEEK IMMEDIATE MEDICAL CARE IF:   During treatment, you develop more pus-like mucus (purulent sputum).   You have a fever.   Your baby is older than 3 months with a rectal temperature of 102 F (38.9 C) or higher.   Your baby is 29 months old or younger with a rectal temperature of 100.4 F (38 C) or higher.   You become progressively more ill.   You have increased difficulty breathing, wheezing, or shortness of breath.  It is necessary to seek immediate medical care if you are elderly or sick from any other disease. MAKE SURE YOU:   Understand these instructions.   Will watch your condition.   Will get help right away if you are not doing well or get worse.  Document Released: 01/02/2005 Document Revised: 12/22/2010 Document Reviewed: 11/12/2007 [facility] Patient Information 2012 [place].

## 2011-03-20 NOTE — Progress Notes (Signed)
  Subjective:    Patient ID: [name], female    DOB: 1955-04-28, 56 y.o.   MRN: [id number]  Cough This is a new problem. The current episode started 1 to 4 weeks ago. The problem has been gradually worsening. The problem occurs every few minutes. Associated symptoms include a fever. Pertinent negatives include no chest pain, chills, nasal congestion, rash, rhinorrhea or wheezing. The symptoms are aggravated by lying down. She has tried OTC cough suppressant for the symptoms. The treatment provided no relief. There is no history of asthma.  [name] comes in with a 2 weeks history of cough which is productive at times of clear mucous.  She has a remote history of pneumonia but no asthma or chronic lung problems.  She has felt much better after stopping her lisinopril, she has less fatigue.  She has a history of anemia.  She comes in today after her work shift as the cough is making it hard for her to sleep, she coughs so hard at times she vomits up food.  She has not eaten in 6.5 hours.    Review of Systems  Constitutional: Positive for fever. Negative for chills.  HENT: Negative for rhinorrhea.   Eyes: Negative.   Respiratory: Positive for cough. Negative for wheezing.   Cardiovascular: Negative.  Negative for chest pain.  Gastrointestinal: Positive for vomiting.  Genitourinary: Negative.   Musculoskeletal: Negative.   Skin: Negative for rash.  Neurological: Negative.   Hematological: Negative.   Psychiatric/Behavioral: Negative.   She denies CP or SOB.  She only vomits when she coughs very hard.  Pulse is 100.    Objective:   Physical Exam  Constitutional: She is oriented to person, place, and time.       Morbidly obese, no weight loss since last OV  HENT:  Head: Normocephalic and atraumatic.  Right Ear: External ear normal.  Left Ear: External ear normal.  Mouth/Throat: Oropharynx is clear and moist. No oropharyngeal exudate.  Eyes: Conjunctivae are normal.  Neck: Neck  supple.  Cardiovascular: Normal rate, regular rhythm and normal heart sounds.   Pulmonary/Chest: Effort normal and breath sounds normal. No respiratory distress. She has no wheezes. She has no rales. She exhibits no tenderness.       Lung fields sound clear without rales, or rhonchii.  Abdominal: Soft.  Lymphadenopathy:    She has no cervical adenopathy.  Neurological: She is alert and oriented to person, place, and time.  Skin: Skin is warm and dry.  Psychiatric: She has a normal mood and affect. Her behavior is normal.          Assessment & Plan:  Bronchitis.  Azithromycin 500 mg for 5 days.  Tessalon Perles for cough.  Extra fluids and rest. Hypertension.  Add Amlodipine 5 mg daily to her HCTZ.  BMT is pending.  Recheck in 4-6 months. Anemia, this has improved.  HGB 11.5 today was 10.0 last OV  She does have a colonoscopy scheduled and she promises me she will keep that appt.  AVS is printed and given to pt.

## 2011-03-23 ENCOUNTER — Telehealth: Payer: Self-pay | Admitting: Physician Assistant

## 2011-03-23 MED ORDER — HYDROCOD POLST-CHLORPHEN POLST 10-8 MG/5ML PO LQCR: 5.0000 mL | Freq: Two times a day (BID) | ORAL | Status: DC

## 2011-03-23 NOTE — Telephone Encounter (Signed)
Gave pt results of lab and f/up plan. Pt agreed. Pt reports that she is actually feeling a little worse than when she was here. She is running a fever which is a little higher (100.9) than before and when she coughs her chest feels tighter and sputum is thicker than it was. Instructed pt to start taking Mucinex and inc water intake. Pt requests a stronger cough med for night that will help her sleep better, and didn't know if she should try a different Abx since she is worse instead of better? Do you want to Rx something else? - copied from lab results.  Rx for nightime cough meds signed at TL desk.Marland Kitchen

## 2011-03-23 NOTE — Telephone Encounter (Signed)
Explained calling in Rx for stronger cough med and advised pt again to take Mucinex and RTC or CB if Sxs persist/worsen. Called in Rx

## 2011-04-03 ENCOUNTER — Telehealth: Payer: Self-pay

## 2011-04-03 NOTE — Telephone Encounter (Signed)
Pt need a rtw note stating she was seen in office for a cold. Please contact when ready for pick-up

## 2011-04-03 NOTE — Telephone Encounter (Signed)
rtw note printed and ready for pickup. Pt notified

## 2011-04-13 ENCOUNTER — Telehealth: Payer: Self-pay

## 2011-04-13 NOTE — Telephone Encounter (Signed)
Pt has developed a cough and is wondering if it is from acid reflux or something else

## 2011-04-14 NOTE — Telephone Encounter (Signed)
Spoke with patient advised her if she feel like she not getting any better than return to clinic but if she's taking mucinex the drainage from that will make her cough due to the mucus

## 2011-05-28 ENCOUNTER — Ambulatory Visit (INDEPENDENT_AMBULATORY_CARE_PROVIDER_SITE_OTHER): Payer: BC Managed Care – PPO | Admitting: Emergency Medicine

## 2011-05-28 ENCOUNTER — Ambulatory Visit: Payer: BC Managed Care – PPO

## 2011-05-28 VITALS — BP 147/82 | HR 76 | Temp 98.1°F | Resp 16 | Ht 61.5 in | Wt 251.0 lb

## 2011-05-28 DIAGNOSIS — R05 Cough: Secondary | ICD-10-CM

## 2011-05-28 DIAGNOSIS — R059 Cough, unspecified: Secondary | ICD-10-CM

## 2011-05-28 MED ORDER — RANITIDINE HCL 150 MG PO TABS: 150.0000 mg | ORAL_TABLET | Freq: Two times a day (BID) | ORAL | Status: DC

## 2011-05-28 MED ORDER — HYDROCOD POLST-CHLORPHEN POLST 10-8 MG/5ML PO LQCR: 5.0000 mL | Freq: Two times a day (BID) | ORAL | Status: DC

## 2011-05-28 NOTE — Progress Notes (Signed)
  Subjective:    Patient ID: [name], female    DOB: 28-Mar-1955, 56 y.o.   MRN: [id number]  HPI patient enters with a dry cough. She was seen here approximately 6 weeks ago and treated with azithromycin and cough medication. She states she has not been sick with fever chills or other symptoms. She just is unable to stop her cough. Just has a history of hypertension    Review of Systems     Objective:   Physical Exam HEENT exam reveals an alert female who does not appear ill or are clear her nose is normal throat is clear chest exam is clear to auscultation and percussion. She did produce some phlegm which was clear and mucoid.  [facility] reading (PRIMARY) by  Dr.Rilee Wendling NAD         Assessment & Plan:  Patient presents with a cough unresponsive to antibiotics she is already on treatment for reflux but feels is not working. We'll check a baseline chest x-ray if normal treated with cough suppressants and add Zantac for reflux Will give Tussionex 1 teaspoon at night for cough.. Have her take Mucinex twice a day. We'll add Zantac 150 twice a day.Marland Kitchen

## 2011-06-23 ENCOUNTER — Telehealth: Payer: Self-pay

## 2011-06-23 NOTE — Telephone Encounter (Signed)
Spoke with patient and advised we would need to see her back before any other meds could be rx'd.  We would need to determine the cause of her chronic cough.  Patient understood.

## 2011-06-23 NOTE — Telephone Encounter (Signed)
Pt is requesting a stronger cough medicine  [facility] on [address]

## 2011-07-13 ENCOUNTER — Ambulatory Visit: Payer: BC Managed Care – PPO

## 2011-07-13 ENCOUNTER — Encounter: Payer: Self-pay | Admitting: Physician Assistant

## 2011-07-13 ENCOUNTER — Ambulatory Visit (INDEPENDENT_AMBULATORY_CARE_PROVIDER_SITE_OTHER): Payer: BC Managed Care – PPO | Admitting: Family Medicine

## 2011-07-13 VITALS — BP 144/90 | HR 80 | Temp 98.0°F | Resp 16 | Ht 59.78 in | Wt 260.2 lb

## 2011-07-13 DIAGNOSIS — R059 Cough, unspecified: Secondary | ICD-10-CM

## 2011-07-13 DIAGNOSIS — K219 Gastro-esophageal reflux disease without esophagitis: Secondary | ICD-10-CM

## 2011-07-13 DIAGNOSIS — IMO0001 Reserved for inherently not codable concepts without codable children: Secondary | ICD-10-CM

## 2011-07-13 DIAGNOSIS — R05 Cough: Secondary | ICD-10-CM

## 2011-07-13 DIAGNOSIS — D649 Anemia, unspecified: Secondary | ICD-10-CM

## 2011-07-13 LAB — POCT CBC
Granulocyte percent: 60 %G (ref 37–80)
HCT, POC: 35.3 % — AB (ref 37.7–47.9)
Hemoglobin: 10.8 g/dL — AB (ref 12.2–16.2)
POC Granulocyte: 3.2 (ref 2–6.9)
POC LYMPH PERCENT: 33.6 %L (ref 10–50)
RDW, POC: 20.4 %

## 2011-07-13 MED ORDER — HYDROCODONE-HOMATROPINE 5-1.5 MG/5ML PO SYRP: ORAL_SOLUTION | ORAL | Status: AC

## 2011-07-13 MED ORDER — ESOMEPRAZOLE MAGNESIUM 40 MG PO CPDR: 40.0000 mg | DELAYED_RELEASE_CAPSULE | Freq: Every day | ORAL | Status: DC

## 2011-07-13 MED ORDER — IPRATROPIUM BROMIDE 0.06 % NA SOLN: 2.0000 | Freq: Three times a day (TID) | NASAL | Status: DC

## 2011-07-13 NOTE — Progress Notes (Signed)
Patient ID: [name] MRN: [id number], DOB: 03-26-1955, 56 y.o. Date of Encounter: 07/13/2011, 9:29 AM  Primary Physician: Jenny Reichmann, MD  Chief Complaint: Cough  HPI: 56 y.o. year old female with history below presents with continued dry cough. Initially seen here in March 2013 for this cough. Diagnosed with bronchitis and treated with Zithromax 500 mg daily for 5 days, and given Tessalon. She returned to our clinic on 05/28/11 with continued dry cough. At that time she did have some mild reflux. Had negative CXR. Was treated with Zantac and Tussionex. Cough did slightly improve, then almost resolve for a week at the beginning of June. Her cough however did return. Cough is dry, not associated with time of day or activity. No SOB, wheezing, dyspnea, or hemoptysis. No chest pain. "I feel great." No swelling in her legs. No current reflux. Mild post nasal drip with some sneezing. Sleeps with two regular sized pillows. This is not a change from her childhood.     Past Medical History  Diagnosis Date  . Hyperlipidemia   . Hypertension   . Leg swelling   . Abdominal distention   . Abdominal pain   . Hernia   . Strangulated recurrent ventral incisional hernia, s/p small intestine resection 09/22/2010     Home Meds: Prior to Admission medications   Medication Sig Start Date End Date Taking? Authorizing Provider  amLODipine (NORVASC) 5 MG tablet Take 1 tablet (5 mg total) by mouth daily. 03/20/11 03/19/12 Yes Kemper Durie, PA-C  atorvastatin (LIPITOR) 40 MG tablet Take 40 mg by mouth daily.     Yes Historical Provider, MD  ranitidine (ZANTAC) 150 MG tablet Take 1 tablet (150 mg total) by mouth 2 (two) times daily. 05/28/11 05/27/12 Yes Darlyne Russian, MD                         Allergies: No Known Allergies  History   Social History  . Marital Status: Widowed    Spouse Name: N/A    Number of Children: N/A  . Years of Education: N/A   Occupational History  . Not on file.    Social History Main Topics  . Smoking status: Never Smoker   . Smokeless tobacco: Never Used  . Alcohol Use: No  . Drug Use: No  . Sexually Active: Not on file   Other Topics Concern  . Not on file   Social History Narrative  . No narrative on file     Review of Systems: Constitutional: negative for chills, fever, night sweats, weight changes, or fatigue  HEENT: negative for vision changes, hearing loss, congestion, rhinorrhea, ST, epistaxis, or sinus pressure Cardiovascular: negative for chest pain or palpitations Respiratory: negative for hemoptysis, dyspnea, wheezing, or shortness of breath Abdominal: negative for abdominal pain, nausea, vomiting, diarrhea, or constipation Dermatological: negative for rash Neurologic: negative for headache, dizziness, or syncope All other systems reviewed and are otherwise negative with the exception to those above and in the HPI.   Physical Exam: Blood pressure 144/90, pulse 80, temperature 98 F (36.7 C), temperature source Oral, resp. rate 16, height 4' 11.78" (1.518 m), weight 260 lb 3.2 oz (118.026 kg), SpO2 98.00%., Body mass index is 51.19 kg/(m^2). General: Well developed, well nourished, in no acute distress. Head: Normocephalic, atraumatic, eyes without discharge, sclera non-icteric, nares are without discharge. Bilateral auditory canals clear, TM's are without perforation, pearly grey and translucent with reflective cone of light bilaterally. Oral  cavity moist, posterior pharynx without exudate, erythema, peritonsillar abscess. Mild post nasal drip.  Neck: Supple. No thyromegaly. Full ROM. No lymphadenopathy. Lungs: Clear bilaterally to auscultation without wheezes, rales, or rhonchi. Breathing is unlabored. Heart: RRR with S1 S2. No murmurs, rubs, or gallops appreciated. Msk:  Strength and tone normal for age. Extremities/Skin: Warm and dry. No clubbing or cyanosis. No edema. No rashes or suspicious lesions. Trace nonpitting edema  to distal tibia bilaterally.  Neuro: Alert and oriented X 3. Moves all extremities spontaneously. Gait is normal. CNII-XII grossly in tact. Psych:  Responds to questions appropriately with a normal affect.   Labs: Results for orders placed in visit on 07/13/11  POCT CBC      Component Value Range   WBC 5.4  4.6 - 10.2 K/uL   Lymph, poc 1.8  0.6 - 3.4   POC LYMPH PERCENT 33.6  10 - 50 %L   MID (cbc) 0.3  0 - 0.9   POC MID % 6.4  0 - 12 %M   POC Granulocyte 3.2  2 - 6.9   Granulocyte percent 60.0  37 - 80 %G   RBC 4.40  4.04 - 5.48 M/uL   Hemoglobin 10.8 (*) 12.2 - 16.2 g/dL   HCT, POC 35.3 (*) 37.7 - 47.9 %   MCV 80.2  80 - 97 fL   MCH, POC 24.5 (*) 27 - 31.2 pg   MCHC 30.6 (*) 31.8 - 35.4 g/dL   RDW, POC 20.4     Platelet Count, POC 268  142 - 424 K/uL   MPV 9.2  0 - 99.8 fL   BNP pending  [facility] reading (PRIMARY) by  Dr. Lorelei Pont. negative   ASSESSMENT AND PLAN:  56 y.o. year old female with chronic cough and anemia 1. Chronic cough -Pulmonology referral -Trial of Nexium 40 mg 1 po daily #30 RF 3 -Stop Zantac -Atrovent NS 0.06% 2 sprays each nare bid prn #1 no RF -Hycodan #4oz 1 tsp po q 4-6 hours prn cough no RF SED -Await BNP -RTC/ER precautions  2. Anemia -Long standing -Has colonoscopy scheduled for mid July, had to reschedule, but promises to go -Take iron tabs -Recheck 3 months   Signed, Christell Faith, PA-C 07/13/2011 9:29 AM

## 2011-07-14 LAB — BRAIN NATRIURETIC PEPTIDE: Brain Natriuretic Peptide: 64.7 pg/mL (ref 0.0–100.0)

## 2011-08-15 ENCOUNTER — Institutional Professional Consult (permissible substitution): Payer: BC Managed Care – PPO | Admitting: Internal Medicine

## 2013-09-17 ENCOUNTER — Ambulatory Visit (INDEPENDENT_AMBULATORY_CARE_PROVIDER_SITE_OTHER): Payer: BC Managed Care – PPO | Admitting: Family Medicine

## 2013-09-17 VITALS — BP 158/104 | HR 90 | Temp 98.0°F | Resp 17 | Ht 60.5 in | Wt 267.0 lb

## 2013-09-17 DIAGNOSIS — E785 Hyperlipidemia, unspecified: Secondary | ICD-10-CM

## 2013-09-17 DIAGNOSIS — R030 Elevated blood-pressure reading, without diagnosis of hypertension: Secondary | ICD-10-CM

## 2013-09-17 DIAGNOSIS — I10 Essential (primary) hypertension: Secondary | ICD-10-CM

## 2013-09-17 DIAGNOSIS — M150 Primary generalized (osteo)arthritis: Secondary | ICD-10-CM

## 2013-09-17 DIAGNOSIS — M159 Polyosteoarthritis, unspecified: Secondary | ICD-10-CM

## 2013-09-17 DIAGNOSIS — IMO0001 Reserved for inherently not codable concepts without codable children: Secondary | ICD-10-CM

## 2013-09-17 DIAGNOSIS — M8949 Other hypertrophic osteoarthropathy, multiple sites: Secondary | ICD-10-CM

## 2013-09-17 LAB — POCT GLYCOSYLATED HEMOGLOBIN (HGB A1C): Hemoglobin A1C: 5.9

## 2013-09-17 MED ORDER — AMLODIPINE BESYLATE 5 MG PO TABS: 5.0000 mg | ORAL_TABLET | Freq: Every day | ORAL | Status: DC

## 2013-09-17 NOTE — Progress Notes (Signed)
Subjective: Pleasant lady who is here complaining of head pressure sensation. She takes her blood pressure is back up. She's been off of her blood pressure medicine for some time. She was feeling well since she stopped it. She works at a [employer]. She knows this her obesity as not good. She is a soft drink addict and it is down to just have to drink a day now. She does not get regular exercise. She tells the kids what is right to me, but she does not do it for herself. She does not smoke. He has not had any chest pain or breathing problems. No abdominal complaints. She generally is doing pretty well.  Objective: Extremely short stature very obese lady, pear-shaped .  TMs normal. Throat clear. Neck supple without nodes or thyromegaly. No carotid bruits. Chest clear. Heart regular without murmurs. Abdomen soft without mass or tenderness, morbidly obese. Extremities are being but not pitting edema. She says that her right leg swells more than the left. Results for orders placed in visit on 09/17/13  POCT GLYCOSYLATED HEMOGLOBIN (HGB A1C)      Result Value Ref Range   Hemoglobin A1C 5.9     Assessment: Hypertension Morbid obesity Sedentary lifestyle History of hyperlipidemia  Plan: Urged her to the right. Her back on the amlodipine. Check labs and decide if I need to get her back on her lipid medications.

## 2013-09-17 NOTE — Patient Instructions (Signed)
Take amlodipine one daily each morning for blood pressure  Return in one month for recheck of blood pressure  We will let you know the results of your laboratory tests and decide whether you need to be on more cholesterol medication again.  Try to continue to work on eliminating soft drinks from your diet.  Try to eat less bread, pastas, potatoes, rice.  A good rule of thumb is to avoid things that avoid things that are white.  Take Tylenol if needed for pain

## 2013-09-18 ENCOUNTER — Other Ambulatory Visit: Payer: Self-pay | Admitting: Radiology

## 2013-09-18 LAB — COMPREHENSIVE METABOLIC PANEL
ALT: 8 U/L (ref 0–35)
AST: 13 U/L (ref 0–37)
Albumin: 4.4 g/dL (ref 3.5–5.2)
Alkaline Phosphatase: 75 U/L (ref 39–117)
BILIRUBIN TOTAL: 0.5 mg/dL (ref 0.2–1.2)
BUN: 15 mg/dL (ref 6–23)
CHLORIDE: 99 meq/L (ref 96–112)
CO2: 24 meq/L (ref 19–32)
CREATININE: 1.14 mg/dL — AB (ref 0.50–1.10)
Calcium: 9.1 mg/dL (ref 8.4–10.5)
Glucose, Bld: 98 mg/dL (ref 70–99)
Potassium: 4.3 mEq/L (ref 3.5–5.3)
SODIUM: 136 meq/L (ref 135–145)
TOTAL PROTEIN: 7.6 g/dL (ref 6.0–8.3)

## 2013-09-18 LAB — LIPID PANEL
Cholesterol: 362 mg/dL — ABNORMAL HIGH (ref 0–200)
HDL: 65 mg/dL (ref >39)
LDL CALC: 276 mg/dL — AB (ref 0–99)
TRIGLYCERIDES: 107 mg/dL (ref <150)
Total CHOL/HDL Ratio: 5.6 Ratio
VLDL: 21 mg/dL (ref 0–40)

## 2013-09-18 MED ORDER — ATORVASTATIN CALCIUM 40 MG PO TABS: 40.0000 mg | ORAL_TABLET | Freq: Every day | ORAL | Status: DC

## 2014-05-06 ENCOUNTER — Ambulatory Visit (INDEPENDENT_AMBULATORY_CARE_PROVIDER_SITE_OTHER): Payer: BC Managed Care – PPO | Admitting: Emergency Medicine

## 2014-05-06 VITALS — BP 186/94 | HR 94 | Temp 98.0°F | Resp 18 | Ht 60.0 in | Wt 259.0 lb

## 2014-05-06 DIAGNOSIS — E785 Hyperlipidemia, unspecified: Secondary | ICD-10-CM | POA: Diagnosis not present

## 2014-05-06 DIAGNOSIS — I10 Essential (primary) hypertension: Secondary | ICD-10-CM | POA: Diagnosis not present

## 2014-05-06 DIAGNOSIS — G4733 Obstructive sleep apnea (adult) (pediatric): Secondary | ICD-10-CM | POA: Diagnosis not present

## 2014-05-06 LAB — CBC WITH DIFFERENTIAL/PLATELET
Basophils Absolute: 0 10*3/uL (ref 0.0–0.1)
Basophils Relative: 0 % (ref 0–1)
EOS PCT: 1 % (ref 0–5)
Eosinophils Absolute: 0.1 10*3/uL (ref 0.0–0.7)
HCT: 33.8 % — ABNORMAL LOW (ref 36.0–46.0)
HEMOGLOBIN: 10.6 g/dL — AB (ref 12.0–15.0)
LYMPHS ABS: 1.9 10*3/uL (ref 0.7–4.0)
LYMPHS PCT: 29 % (ref 12–46)
MCH: 23.9 pg — AB (ref 26.0–34.0)
MCHC: 31.4 g/dL (ref 30.0–36.0)
MCV: 76.1 fL — AB (ref 78.0–100.0)
MONO ABS: 0.4 10*3/uL (ref 0.1–1.0)
MPV: 9.3 fL (ref 8.6–12.4)
Monocytes Relative: 6 % (ref 3–12)
Neutro Abs: 4.2 10*3/uL (ref 1.7–7.7)
Neutrophils Relative %: 64 % (ref 43–77)
Platelets: 289 10*3/uL (ref 150–400)
RBC: 4.44 MIL/uL (ref 3.87–5.11)
RDW: 20.5 % — ABNORMAL HIGH (ref 11.5–15.5)
WBC: 6.6 10*3/uL (ref 4.0–10.5)

## 2014-05-06 LAB — COMPREHENSIVE METABOLIC PANEL
ALK PHOS: 60 U/L (ref 39–117)
ALT: <8 U/L (ref 0–35)
AST: 11 U/L (ref 0–37)
Albumin: 3.7 g/dL (ref 3.5–5.2)
BUN: 11 mg/dL (ref 6–23)
CALCIUM: 9.1 mg/dL (ref 8.4–10.5)
CO2: 23 mEq/L (ref 19–32)
CREATININE: 1.04 mg/dL (ref 0.50–1.10)
Chloride: 106 mEq/L (ref 96–112)
GLUCOSE: 83 mg/dL (ref 70–99)
POTASSIUM: 4.3 meq/L (ref 3.5–5.3)
Sodium: 141 mEq/L (ref 135–145)
Total Bilirubin: 0.4 mg/dL (ref 0.2–1.2)
Total Protein: 6.8 g/dL (ref 6.0–8.3)

## 2014-05-06 LAB — LIPID PANEL
CHOLESTEROL: 290 mg/dL — AB (ref 0–200)
HDL: 62 mg/dL (ref >=46)
LDL CALC: 208 mg/dL — AB (ref 0–99)
Total CHOL/HDL Ratio: 4.7 Ratio
Triglycerides: 99 mg/dL (ref <150)
VLDL: 20 mg/dL (ref 0–40)

## 2014-05-06 LAB — TSH: TSH: 3.563 u[IU]/mL (ref 0.350–4.500)

## 2014-05-06 MED ORDER — ATORVASTATIN CALCIUM 40 MG PO TABS: 40.0000 mg | ORAL_TABLET | Freq: Every day | ORAL | Status: DC

## 2014-05-06 MED ORDER — AMLODIPINE BESYLATE 10 MG PO TABS: 10.0000 mg | ORAL_TABLET | Freq: Every day | ORAL | Status: DC

## 2014-05-06 NOTE — Progress Notes (Signed)
[facility] [address], [place] [address] [phone]  Date:  05/06/2014   Name:  [name]   DOB:  01-06-1956   MRN:  [id number]  PCP:  Jenny Reichmann, MD    Chief Complaint: Hypertension   History of Present Illness:  [name] is a 59 y.o. very pleasant female patient who presents with the following:  History of HBP and HLD.   On amlodipine and is compliant. Says she can't have out of control blood pressure as she feels fine Snores, awakens tired and takes daily naps No improvement with over the counter medications or other home remedies.  Denies other complaint or health concern today.   Patient Active Problem List   Diagnosis Date Noted  . Essential hypertension 05/06/2014  . Hyperlipidemia 05/06/2014  . Strangulated recurrent ventral incisional hernia, s/p small intestine resection 09/22/2010  . Obesity, morbid, BMI >40 09/22/2010  . Edema of lower extremities 09/22/2010    Past Medical History  Diagnosis Date  . Hyperlipidemia   . Hypertension   . Leg swelling   . Abdominal distention   . Abdominal pain   . Hernia   . Strangulated recurrent ventral incisional hernia, s/p small intestine resection 09/22/2010  . Arthritis   . Blood transfusion without reported diagnosis     Past Surgical History  Procedure Laterality Date  . Bowel obstruction  09/12/2010  . Hernia repair  2007    incarcerated, open with biologic mesh  . Hernia repair  [id number]    strangulated, redo open with biologic mesh  . Small intestine surgery  09/12/10    SBR in incarcerated [place]  . Abdominal hysterectomy      History  Substance Use Topics  . Smoking status: Never Smoker   . Smokeless tobacco: Never Used  . Alcohol Use: No    Family History  Problem Relation Age of Onset  . Heart disease Mother     heart attack   . Heart disease Father     heart attack   . Hypertension Sister     No Known Allergies  Medication list has been reviewed  and updated.  Current Outpatient Prescriptions on File Prior to Visit  Medication Sig Dispense Refill  . amLODipine (NORVASC) 5 MG tablet Take 1 tablet (5 mg total) by mouth daily. 90 tablet 1  . atorvastatin (LIPITOR) 40 MG tablet Take 40 mg by mouth daily.      Marland Kitchen atorvastatin (LIPITOR) 40 MG tablet Take 1 tablet (40 mg total) by mouth daily. 90 tablet 1  . esomeprazole (NEXIUM) 40 MG capsule Take 1 capsule (40 mg total) by mouth daily. 30 capsule 3  . ipratropium (ATROVENT) 0.06 % nasal spray Place 2 sprays into the nose 3 (three) times daily. 15 mL 0  . ranitidine (ZANTAC) 150 MG tablet Take 1 tablet (150 mg total) by mouth 2 (two) times daily. 60 tablet 0   No current facility-administered medications on file prior to visit.    Review of Systems:  As per HPI, otherwise negative.    Physical Examination: Filed Vitals:   05/06/14 1505  BP: 186/94  Pulse: 94  Temp: 98 F (36.7 C)  Resp: 18   Filed Vitals:   05/06/14 1505  Height: 5' (1.524 m)  Weight: 259 lb (117.482 kg)   Body mass index is 50.58 kg/(m^2). Ideal Body Weight: Weight in (lb) to have BMI = 25: 127.7  GEN: morbid obesity, NAD, Non-toxic, A &  O x 3 HEENT: Atraumatic, Normocephalic. Neck supple. No masses, No LAD. Ears and Nose: No external deformity. CV: RRR, No M/G/R. No JVD. No thrill. No extra heart sounds. PULM: CTA B, no wheezes, crackles, rhonchi. No retractions. No resp. distress. No accessory muscle use. ABD: S, NT, ND, +BS. No rebound. No HSM. EXTR: No c/c/e NEURO Normal gait.  PSYCH: Normally interactive. Conversant. Not depressed or anxious appearing.  Calm demeanor.    Assessment and Plan: Morbid obesity  Sleep apnea Hypertension HLD Labs Sleep study  Signed,  Ellison Carwin, MD

## 2014-05-06 NOTE — Patient Instructions (Signed)
Sleep Apnea  Sleep apnea is a sleep disorder characterized by abnormal pauses in breathing while you sleep. When your breathing pauses, the level of oxygen in your blood decreases. This causes you to move out of deep sleep and into light sleep. As a result, your quality of sleep is poor, and the system that carries your blood throughout your body (cardiovascular system) experiences stress. If sleep apnea remains untreated, the following conditions can develop:  High blood pressure (hypertension).  Coronary artery disease.  Inability to achieve or maintain an erection (impotence).  Impairment of your thought process (cognitive dysfunction). There are three types of sleep apnea: 1. Obstructive sleep apnea--Pauses in breathing during sleep because of a blocked airway. 2. Central sleep apnea--Pauses in breathing during sleep because the area of the brain that controls your breathing does not send the correct signals to the muscles that control breathing. 3. Mixed sleep apnea--A combination of both obstructive and central sleep apnea. RISK FACTORS The following risk factors can increase your risk of developing sleep apnea:  Being overweight.  Smoking.  Having narrow passages in your nose and throat.  Being of older age.  Being female.  Alcohol use.  Sedative and tranquilizer use.  Ethnicity. Among individuals younger than 35 years, African Americans are at increased risk of sleep apnea. SYMPTOMS   Difficulty staying asleep.  Daytime sleepiness and fatigue.  Loss of energy.  Irritability.  Loud, heavy snoring.  Morning headaches.  Trouble concentrating.  Forgetfulness.  Decreased interest in sex. DIAGNOSIS  In order to diagnose sleep apnea, your caregiver will perform a physical examination. Your caregiver may suggest that you take a home sleep test. Your caregiver may also recommend that you spend the night in a sleep lab. In the sleep lab, several monitors record  information about your heart, lungs, and brain while you sleep. Your leg and arm movements and blood oxygen level are also recorded. TREATMENT The following actions may help to resolve mild sleep apnea:  Sleeping on your side.   Using a decongestant if you have nasal congestion.   Avoiding the use of depressants, including alcohol, sedatives, and narcotics.   Losing weight and modifying your diet if you are overweight. There also are devices and treatments to help open your airway:  Oral appliances. These are custom-made mouthpieces that shift your lower jaw forward and slightly open your bite. This opens your airway.  Devices that create positive airway pressure. This positive pressure "splints" your airway open to help you breathe better during sleep. The following devices create positive airway pressure:  Continuous positive airway pressure (CPAP) device. The CPAP device creates a continuous level of air pressure with an air pump. The air is delivered to your airway through a mask while you sleep. This continuous pressure keeps your airway open.  Nasal expiratory positive airway pressure (EPAP) device. The EPAP device creates positive air pressure as you exhale. The device consists of single-use valves, which are inserted into each nostril and held in place by adhesive. The valves create very little resistance when you inhale but create much more resistance when you exhale. That increased resistance creates the positive airway pressure. This positive pressure while you exhale keeps your airway open, making it easier to breath when you inhale again.  Bilevel positive airway pressure (BPAP) device. The BPAP device is used mainly in patients with central sleep apnea. This device is similar to the CPAP device because it also uses an air pump to deliver continuous air pressure   through a mask. However, with the BPAP machine, the pressure is set at two different levels. The pressure when you  exhale is lower than the pressure when you inhale.  Surgery. Typically, surgery is only done if you cannot comply with less invasive treatments or if the less invasive treatments do not improve your condition. Surgery involves removing excess tissue in your airway to create a wider passage way. Document Released: 12/23/2001 Document Revised: 04/29/2012 Document Reviewed: 05/11/2011 ExitCare Patient Information 2015 [employer]. This information is not intended to replace advice given to you by your health care provider. Make sure you discuss any questions you have with your health care provider.  

## 2014-06-12 ENCOUNTER — Encounter: Payer: Self-pay | Admitting: *Deleted

## 2014-08-28 ENCOUNTER — Telehealth: Payer: Self-pay | Admitting: *Deleted

## 2014-08-28 NOTE — Telephone Encounter (Signed)
[facility] with name, title, location, PCP and direct #

## 2014-09-07 ENCOUNTER — Telehealth: Payer: Self-pay | Admitting: *Deleted

## 2014-09-07 DIAGNOSIS — Z1231 Encounter for screening mammogram for malignant neoplasm of breast: Secondary | ICD-10-CM

## 2014-09-07 NOTE — Telephone Encounter (Signed)
Returning patient's phone call, which she was returning mine.  Ordered mammogram and advised patient that she would need to call the facility to schedule.  She verbalized understanding & appreciation.

## 2014-09-08 ENCOUNTER — Other Ambulatory Visit: Payer: Self-pay

## 2014-09-08 DIAGNOSIS — Z1231 Encounter for screening mammogram for malignant neoplasm of breast: Secondary | ICD-10-CM

## 2014-10-08 ENCOUNTER — Ambulatory Visit (INDEPENDENT_AMBULATORY_CARE_PROVIDER_SITE_OTHER): Payer: BC Managed Care – PPO | Admitting: Emergency Medicine

## 2014-10-08 VITALS — BP 128/88 | HR 90 | Temp 97.9°F | Resp 20 | Ht 60.0 in | Wt 268.4 lb

## 2014-10-08 DIAGNOSIS — R079 Chest pain, unspecified: Secondary | ICD-10-CM

## 2014-10-08 MED ORDER — CYCLOBENZAPRINE HCL 10 MG PO TABS: 10.0000 mg | ORAL_TABLET | Freq: Three times a day (TID) | ORAL | Status: DC | PRN

## 2014-10-08 MED ORDER — NAPROXEN SODIUM 550 MG PO TABS
550.0000 mg | ORAL_TABLET | Freq: Two times a day (BID) | ORAL | Status: DC
Start: 2014-10-08 — End: 2015-04-13

## 2014-10-08 NOTE — Patient Instructions (Signed)

## 2014-10-08 NOTE — Progress Notes (Signed)
Subjective:  Patient ID: [name], female    DOB: [birth year]  Age: 59 y.o. MRN: [id number]  CC: Muscle Pain   HPI Velda Ratay presents  with left anterior chest pain. She said the pain is worse when she bends over or lifts anything. Pain is nonradiating doesn't go in her neck teeth child back shoulder. She has a history of hypertension and hypercholesterolemia she's nonsmoker does not have diabetes. She doesn't have any family history of accelerated cardiovascular disease decisions been having constant pain since yesterday. And is rather tender in the location pain is in her left anterior chest  History Trenita has a past medical history of Hyperlipidemia; Hypertension; Leg swelling; Abdominal distention; Abdominal pain; Hernia; Strangulated recurrent ventral incisional hernia, s/p small intestine resection (09/22/2010); Arthritis; and Blood transfusion without reported diagnosis.   She has past surgical history that includes bowel obstruction (09/12/2010); Hernia repair (2007); Hernia repair [phone]); Small intestine surgery (09/12/10); and Abdominal hysterectomy.   Her  family history includes Heart disease in her father and mother; Hypertension in her sister.  She   reports that she has never smoked. She has never used smokeless tobacco. She reports that she does not drink alcohol or use illicit drugs.  Outpatient Prescriptions Prior to Visit  Medication Sig Dispense Refill  . amLODipine (NORVASC) 10 MG tablet Take 1 tablet (10 mg total) by mouth daily. 90 tablet 1  . atorvastatin (LIPITOR) 40 MG tablet Take 40 mg by mouth daily.      Marland Kitchen atorvastatin (LIPITOR) 40 MG tablet Take 1 tablet (40 mg total) by mouth daily. 90 tablet 1  . esomeprazole (NEXIUM) 40 MG capsule Take 1 capsule (40 mg total) by mouth daily. 30 capsule 3  . ipratropium (ATROVENT) 0.06 % nasal spray Place 2 sprays into the nose 3 (three) times daily. 15 mL 0  . ranitidine (ZANTAC) 150 MG tablet Take 1 tablet  (150 mg total) by mouth 2 (two) times daily. 60 tablet 0   No facility-administered medications prior to visit.    Social History   Social History  . Marital Status: Widowed    Spouse Name: N/A  . Number of Children: N/A  . Years of Education: N/A   Social History Main Topics  . Smoking status: Never Smoker   . Smokeless tobacco: Never Used  . Alcohol Use: No  . Drug Use: No  . Sexual Activity: No   Other Topics Concern  . None   Social History Narrative     Review of Systems  Constitutional: Negative for fever, chills and appetite change.  HENT: Negative for congestion, ear pain, postnasal drip, sinus pressure and sore throat.   Eyes: Negative for pain and redness.  Respiratory: Negative for cough, shortness of breath and wheezing.   Cardiovascular: Positive for chest pain. Negative for leg swelling.  Gastrointestinal: Negative for nausea, vomiting, abdominal pain, diarrhea, constipation and blood in stool.  Endocrine: Negative for polyuria.  Genitourinary: Negative for dysuria, urgency, frequency and flank pain.  Musculoskeletal: Negative for gait problem.  Skin: Negative for rash.  Neurological: Negative for weakness and headaches.  Psychiatric/Behavioral: Negative for confusion and decreased concentration. The patient is not nervous/anxious.     Objective:  BP 128/88 mmHg  Pulse 90  Temp(Src) 97.9 F (36.6 C) (Oral)  Resp 20  Ht 5' (1.524 m)  Wt 268 lb 6.4 oz (121.745 kg)  BMI 52.42 kg/m2  SpO2 98%  Physical Exam  Constitutional: She is oriented to person, place, and  time. She appears well-developed and well-nourished. No distress.  HENT:  Head: Normocephalic and atraumatic.  Right Ear: External ear normal.  Left Ear: External ear normal.  Nose: Nose normal.  Eyes: Conjunctivae and EOM are normal. Pupils are equal, round, and reactive to light. No scleral icterus.  Neck: Normal range of motion. Neck supple. No tracheal deviation present.    Cardiovascular: Normal rate, regular rhythm and normal heart sounds.   Pulmonary/Chest: Effort normal. No respiratory distress. She has no wheezes. She has no rales. She exhibits tenderness.    Abdominal: She exhibits no mass. There is no tenderness. There is no rebound and no guarding.  Musculoskeletal: She exhibits no edema.  Lymphadenopathy:    She has no cervical adenopathy.  Neurological: She is alert and oriented to person, place, and time. Coordination normal.  Skin: Skin is warm and dry. No rash noted.  Psychiatric: She has a normal mood and affect. Her behavior is normal.      Assessment & Plan:   Breezy was seen today for muscle pain.  Diagnoses and all orders for this visit:  Chest pain, unspecified chest pain type -     EKG 12-Lead  Other orders -     naproxen sodium (ANAPROX DS) 550 MG tablet; Take 1 tablet (550 mg total) by mouth 2 (two) times daily with a meal. -     cyclobenzaprine (FLEXERIL) 10 MG tablet; Take 1 tablet (10 mg total) by mouth 3 (three) times daily as needed for muscle spasms.   I am having Ms. Preiss start on naproxen sodium and cyclobenzaprine. I am also having her maintain her atorvastatin, ranitidine, esomeprazole, ipratropium, amLODipine, and atorvastatin.  Meds ordered this encounter  Medications  . naproxen sodium (ANAPROX DS) 550 MG tablet    Sig: Take 1 tablet (550 mg total) by mouth 2 (two) times daily with a meal.    Dispense:  40 tablet    Refill:  0  . cyclobenzaprine (FLEXERIL) 10 MG tablet    Sig: Take 1 tablet (10 mg total) by mouth 3 (three) times daily as needed for muscle spasms.    Dispense:  30 tablet    Refill:  0    Appropriate red flag conditions were discussed with the patient as well as actions that should be taken.  Patient expressed his understanding.  Follow-up: Return if symptoms worsen or fail to improve.  Roselee Culver, MD

## 2014-10-15 ENCOUNTER — Telehealth: Payer: Self-pay | Admitting: Family Medicine

## 2014-10-15 NOTE — Telephone Encounter (Signed)
Left a message for patient to return call to get the date of her mammogram

## 2014-10-15 NOTE — Telephone Encounter (Signed)
Patient returned phone call and is scheduled to have mammogram on 10/16/14 at [facility].  She will have them fax [place] a copy of the report as soon as they get it so that we may update our records.

## 2014-10-16 ENCOUNTER — Ambulatory Visit
Admission: RE | Admit: 2014-10-16 | Discharge: 2014-10-16 | Disposition: A | Discharge status: Home / Self Care | Payer: BC Managed Care – PPO | Source: Ambulatory Visit | Attending: Emergency Medicine | Admitting: Emergency Medicine

## 2014-10-16 DIAGNOSIS — Z1231 Encounter for screening mammogram for malignant neoplasm of breast: Secondary | ICD-10-CM

## 2014-10-20 ENCOUNTER — Telehealth: Payer: Self-pay

## 2014-10-20 ENCOUNTER — Encounter: Payer: Self-pay | Admitting: Emergency Medicine

## 2014-10-20 NOTE — Telephone Encounter (Signed)
Dr. Everlene Farrier,  Spoke to pt regarding results. She said last year she had a biopsy done and it came back as fatty tissue. What was her next step? Do I need to place an order for a diagnostic mammo? Pt is okay with having another diagnostic mammogram.   Thank you, Clarissia Mckeen  Notes Recorded by Anthoney Harada on 10/20/2014 at 2:23 PM Did not reach patient left message to call back ------  Notes Recorded by Darlyne Russian, MD on 10/20/2014 at 7:38 AM Please call patient and advised her she needs additional imaging. There is an asymmetry noted which needs to be evaluated further

## 2014-10-21 ENCOUNTER — Other Ambulatory Visit: Payer: Self-pay | Admitting: Emergency Medicine

## 2014-10-21 DIAGNOSIS — R928 Other abnormal and inconclusive findings on diagnostic imaging of breast: Secondary | ICD-10-CM

## 2014-10-21 NOTE — Telephone Encounter (Signed)
She needs to discuss this with the radiologist at the imaging center. They can give her advice about the next step in evaluation of this abnormality. If she needs me to order an MRI or further imaging I am happy to do that but she needs to discuss this with the radiologist first. If she is unable to do that I will be happy to see her at 102 and discuss further imaging evaluation.

## 2014-10-23 NOTE — Telephone Encounter (Signed)
Left message for pt to call back  °

## 2014-10-27 ENCOUNTER — Encounter: Payer: Self-pay | Admitting: Radiology

## 2014-10-29 ENCOUNTER — Other Ambulatory Visit: Payer: Self-pay | Admitting: Emergency Medicine

## 2014-10-29 ENCOUNTER — Ambulatory Visit
Admission: RE | Admit: 2014-10-29 | Discharge: 2014-10-29 | Disposition: A | Discharge status: Home / Self Care | Payer: BC Managed Care – PPO | Source: Ambulatory Visit | Attending: Emergency Medicine | Admitting: Emergency Medicine

## 2014-10-29 DIAGNOSIS — R928 Other abnormal and inconclusive findings on diagnostic imaging of breast: Secondary | ICD-10-CM

## 2014-11-05 ENCOUNTER — Other Ambulatory Visit: Payer: Self-pay | Admitting: Emergency Medicine

## 2014-11-05 ENCOUNTER — Ambulatory Visit
Admission: RE | Admit: 2014-11-05 | Discharge: 2014-11-05 | Disposition: A | Discharge status: Home / Self Care | Payer: BC Managed Care – PPO | Source: Ambulatory Visit | Attending: Emergency Medicine | Admitting: Emergency Medicine

## 2014-11-05 DIAGNOSIS — R928 Other abnormal and inconclusive findings on diagnostic imaging of breast: Secondary | ICD-10-CM

## 2015-04-13 ENCOUNTER — Ambulatory Visit (INDEPENDENT_AMBULATORY_CARE_PROVIDER_SITE_OTHER): Payer: Worker's Compensation | Admitting: Family Medicine

## 2015-04-13 VITALS — BP 120/70 | HR 93 | Temp 98.5°F | Resp 18 | Ht 61.0 in | Wt 259.0 lb

## 2015-04-13 DIAGNOSIS — H1189 Other specified disorders of conjunctiva: Secondary | ICD-10-CM | POA: Diagnosis not present

## 2015-04-13 NOTE — Progress Notes (Signed)
[name] is a 60 y.o. female who presents today for R eye pain.  R eye pain - Ongoing since work today around 1:45 PM when she got soap in her eye and tried washing it out.  However, she has continued to have pain in the eye as well as itching.  Denies photophobia, blurred vision, diplopia.  No previous injury and denies foreign body possibility.    Past Medical History  Diagnosis Date  . Hyperlipidemia   . Hypertension   . Leg swelling   . Abdominal distention   . Abdominal pain   . Hernia   . Strangulated recurrent ventral incisional hernia, s/p small intestine resection 09/22/2010  . Arthritis   . Blood transfusion without reported diagnosis     History  Smoking status  . Never Smoker   Smokeless tobacco  . Never Used    Family History  Problem Relation Age of Onset  . Heart disease Mother     heart attack   . Heart disease Father     heart attack   . Hypertension Sister     Current Outpatient Prescriptions on File Prior to Visit  Medication Sig Dispense Refill  . amLODipine (NORVASC) 10 MG tablet Take 1 tablet (10 mg total) by mouth daily. 90 tablet 1  . atorvastatin (LIPITOR) 40 MG tablet Take 40 mg by mouth daily.      Marland Kitchen atorvastatin (LIPITOR) 40 MG tablet Take 1 tablet (40 mg total) by mouth daily. 90 tablet 1  . esomeprazole (NEXIUM) 40 MG capsule Take 1 capsule (40 mg total) by mouth daily. 30 capsule 3  . ipratropium (ATROVENT) 0.06 % nasal spray Place 2 sprays into the nose 3 (three) times daily. 15 mL 0  . ranitidine (ZANTAC) 150 MG tablet Take 1 tablet (150 mg total) by mouth 2 (two) times daily. 60 tablet 0   No current facility-administered medications on file prior to visit.    ROS: Per HPI.  All other systems reviewed and are negative.   Physical Exam Filed Vitals:   04/13/15 1724  BP: 120/70  Pulse: 93  Temp: 98.5 F (36.9 C)  Resp: 18    Physical Examination: General appearance - alert, well appearing, and in no distress Eyes -  PERRLA, EOMI B/L.  Epithelial conjunctival injection.  Fluorescein staining shows no defect or abrasion.  No corneal lesion.      Chemistry      Component Value Date/Time   NA 141 05/06/2014 1538   K 4.3 05/06/2014 1538   CL 106 05/06/2014 1538   CO2 23 05/06/2014 1538   BUN 11 05/06/2014 1538   CREATININE 1.04 05/06/2014 1538   CREATININE 0.66 09/30/2010 0510      Component Value Date/Time   CALCIUM 9.1 05/06/2014 1538   ALKPHOS 60 05/06/2014 1538   AST 11 05/06/2014 1538   ALT <8 05/06/2014 1538   BILITOT 0.4 05/06/2014 1538      Lab Results  Component Value Date   WBC 6.6 05/06/2014   HGB 10.6* 05/06/2014   HCT 33.8* 05/06/2014   MCV 76.1* 05/06/2014   PLT 289 05/06/2014   Lab Results  Component Value Date   TSH 3.563 05/06/2014   Lab Results  Component Value Date   HGBA1C 5.9 09/17/2013      Impression/Plan 1) Conjunctival irritation - No evidence of foreign body and her vision is 20/20 corrected B/L.  Cyclopegic eye drops placed in L eye, continue with rinses, and ibuprofen for pain.  Can use visine as well to help with flushing as this is 2/2 to soap irritation.   If this does not improve over the next 24-48, she needs to follow up which was explained in detail.  If vision is getting worse, she needs to be seen in ED or ophtho.  She understands and is amenable to this plan.

## 2015-04-13 NOTE — Patient Instructions (Signed)
     IF you received an x-ray today, you will receive an invoice from [facility]. Please contact [facility] at [phone] with questions or concerns regarding your invoice.   IF you received labwork today, you will receive an invoice from [facility]. Please contact [facility] at [phone] with questions or concerns regarding your invoice.   Our billing staff will not be able to assist you with questions regarding bills from these companies.  You will be contacted with the lab results as soon as they are available. The fastest way to get your results is to activate your My Chart account. Instructions are located on the last page of this paperwork. If you have not heard from us regarding the results in 2 weeks, please contact this office.      

## 2015-04-16 ENCOUNTER — Telehealth: Payer: Self-pay | Admitting: *Deleted

## 2015-04-16 NOTE — Telephone Encounter (Signed)
LMOM for patient to call me back. Was calling to check on her eye per Dr.Hopper so he can sign paper work to be sent back to work

## 2015-04-20 ENCOUNTER — Telehealth: Payer: Self-pay | Admitting: *Deleted

## 2015-04-20 NOTE — Telephone Encounter (Signed)
Patient called back and spoke with Romie Minus.  She indicated that her eye was doing better.  She has been back to work since her last visit.  Her paper work was completed and faxed by Romie Minus.

## 2015-05-13 ENCOUNTER — Ambulatory Visit (INDEPENDENT_AMBULATORY_CARE_PROVIDER_SITE_OTHER): Payer: BC Managed Care – PPO | Admitting: Family Medicine

## 2015-05-13 VITALS — BP 122/78 | HR 94 | Temp 98.1°F | Resp 18 | Wt 259.0 lb

## 2015-05-13 DIAGNOSIS — D509 Iron deficiency anemia, unspecified: Secondary | ICD-10-CM

## 2015-05-13 DIAGNOSIS — R05 Cough: Secondary | ICD-10-CM | POA: Diagnosis not present

## 2015-05-13 DIAGNOSIS — K219 Gastro-esophageal reflux disease without esophagitis: Secondary | ICD-10-CM

## 2015-05-13 DIAGNOSIS — R1013 Epigastric pain: Secondary | ICD-10-CM

## 2015-05-13 DIAGNOSIS — R059 Cough, unspecified: Secondary | ICD-10-CM

## 2015-05-13 DIAGNOSIS — R300 Dysuria: Secondary | ICD-10-CM | POA: Diagnosis not present

## 2015-05-13 DIAGNOSIS — R109 Unspecified abdominal pain: Secondary | ICD-10-CM

## 2015-05-13 DIAGNOSIS — K30 Functional dyspepsia: Secondary | ICD-10-CM

## 2015-05-13 LAB — POCT CBC
GRANULOCYTE PERCENT: 65.6 % (ref 37–80)
HCT, POC: 34.9 % — AB (ref 37.7–47.9)
Hemoglobin: 11.7 g/dL — AB (ref 12.2–16.2)
Lymph, poc: 2 (ref 0.6–3.4)
MCH: 26.3 pg — AB (ref 27–31.2)
MCHC: 33.4 g/dL (ref 31.8–35.4)
MCV: 78.7 fL — AB (ref 80–97)
MID (CBC): 0.2 (ref 0–0.9)
MPV: 7.3 fL (ref 0–99.8)
PLATELET COUNT, POC: 280 10*3/uL (ref 142–424)
POC Granulocyte: 4.3 (ref 2–6.9)
POC LYMPH PERCENT: 31 %L (ref 10–50)
POC MID %: 3.4 %M (ref 0–12)
RBC: 4.43 M/uL (ref 4.04–5.48)
RDW, POC: 19.8 %
WBC: 6.5 10*3/uL (ref 4.6–10.2)

## 2015-05-13 LAB — POCT URINALYSIS DIP (MANUAL ENTRY)
BILIRUBIN UA: NEGATIVE
BILIRUBIN UA: NEGATIVE
Blood, UA: NEGATIVE
GLUCOSE UA: NEGATIVE
LEUKOCYTES UA: NEGATIVE
Nitrite, UA: NEGATIVE
Protein Ur, POC: NEGATIVE
Spec Grav, UA: 1.02
Urobilinogen, UA: 2
pH, UA: 6

## 2015-05-13 LAB — IRON AND TIBC
%SAT: 10 % — ABNORMAL LOW (ref 11–50)
IRON: 33 ug/dL — AB (ref 45–160)
TIBC: 334 ug/dL (ref 250–450)
UIBC: 301 ug/dL (ref 125–400)

## 2015-05-13 LAB — POC MICROSCOPIC URINALYSIS (UMFC): Mucus: ABSENT

## 2015-05-13 MED ORDER — BENZONATATE 100 MG PO CAPS: 100.0000 mg | ORAL_CAPSULE | Freq: Three times a day (TID) | ORAL | Status: DC | PRN

## 2015-05-13 MED ORDER — PANTOPRAZOLE SODIUM 40 MG PO TBEC: 40.0000 mg | DELAYED_RELEASE_TABLET | Freq: Every day | ORAL | Status: DC

## 2015-05-13 NOTE — Progress Notes (Signed)
Patient ID: [name], female    DOB: September 03, 1955  Age: 60 y.o. MRN: [id number]  Chief Complaint  Patient presents with  . Cough  . Abdominal Pain    Right side    Subjective:   60 year old lady here with complaint of right flank pain. She awakened this morning and got up and started hurting there. She was doing her usual activities at work yesterday but did a lot of lifting and moving also. She thought it might be a kidney infection, but she does not have any burning when she urinates. Her bowels act fairly regularly. She has been coughing a lot over the last for 5 days which makes her abdomen hurt more. She also has been having a lot of indigestion lately. She used to be on Nexium for that and did well for a while. She takes Alka-Seltzer is now.  Current allergies, medications, problem list, past/family and social histories reviewed.  Objective:  BP 122/78 mmHg  Pulse 94  Temp(Src) 98.1 F (36.7 C) (Oral)  Resp 18  Wt 259 lb (117.482 kg)  SpO2 96%  Pleasant lady in no acute distress. Chest is clear. Heart regular without murmur. Her abdomen has normal bowel sounds. Can hear her stomach rumbling occasionally. Her abdomen has a medium-sized ventral hernia probably. She's had previous abdominal wall hernia repairs twice. Her abdomen is soft without organomegaly or significant masses or tenderness. No CVA tenderness.  Assessment & Plan:   Assessment: 1. Dysuria   2. Right flank pain   3. Cough   4. Indigestion   5. Gastroesophageal reflux disease, esophagitis presence not specified   6. Microcytic anemia    Results for orders placed or performed in visit on 05/13/15  POCT Microscopic Urinalysis ([facility])  Result Value Ref Range   WBC,UR,HPF,POC Few (A) None WBC/hpf   RBC,UR,HPF,POC None None RBC/hpf   Bacteria Few (A) None, Too numerous to count   Mucus Absent Absent   Epithelial Cells, UR Per Microscopy Moderate (A) None, Too numerous to count cells/hpf  POCT urinalysis  dipstick  Result Value Ref Range   Color, UA yellow yellow   Clarity, UA clear clear   Glucose, UA negative negative   Bilirubin, UA negative negative   Ketones, POC UA negative negative   Spec Grav, UA 1.020    Blood, UA negative negative   pH, UA 6.0    Protein Ur, POC negative negative   Urobilinogen, UA 2.0    Nitrite, UA Negative Negative   Leukocytes, UA Negative Negative  POCT CBC  Result Value Ref Range   WBC 6.5 4.6 - 10.2 K/uL   Lymph, poc 2.0 0.6 - 3.4   POC LYMPH PERCENT 31.0 10 - 50 %L   MID (cbc) 0.2 0 - 0.9   POC MID % 3.4 0 - 12 %M   POC Granulocyte 4.3 2 - 6.9   Granulocyte percent 65.6 37 - 80 %G   RBC 4.43 4.04 - 5.48 M/uL   Hemoglobin 11.7 (A) 12.2 - 16.2 g/dL   HCT, POC 34.9 (A) 37.7 - 47.9 %   MCV 78.7 (A) 80 - 97 fL   MCH, POC 26.3 (A) 27 - 31.2 pg   MCHC 33.4 31.8 - 35.4 g/dL   RDW, POC 19.8 %   Platelet Count, POC 280 142 - 424 K/uL   MPV 7.3 0 - 99.8 fL      Plan: Will get a CBC because of the cough and flank pain. Needs to be  treated for her reflux.  Orders Placed This Encounter  Procedures  . Iron and TIBC  . POCT Microscopic Urinalysis ([facility])  . POCT urinalysis dipstick  . POCT CBC    Meds ordered this encounter  Medications  . pantoprazole (PROTONIX) 40 MG tablet    Sig: Take 1 tablet (40 mg total) by mouth daily.    Dispense:  30 tablet    Refill:  3  . benzonatate (TESSALON) 100 MG capsule    Sig: Take 1-2 capsules (100-200 mg total) by mouth 3 (three) times daily as needed.    Dispense:  30 capsule    Refill:  0         Patient Instructions   Take over-the-counter Claritin or Allegra 1 daily to try and reduce the postnasal drainage. That is causing some of the cough.  Take the benzonatate cough pills one or 2 pills 3 times daily as needed for daytime cough  Drink lots of water which helps keep the secretions thinner  Take Tylenol 500 mg 2 tablets 3 times daily as needed for pain in the flank. If you develop worse  pain please return.  Take the pantoprazole 40 mg 1 daily for your stomach.  Take over-the-counter iron one twice daily for one month, then 1 once daily for 2 months. Return in about July to get your blood count rechecked.  Return at anytime if needed    IF you received an x-ray today, you will receive an invoice from [facility]. Please contact [facility] at [phone] with questions or concerns regarding your invoice.   IF you received labwork today, you will receive an invoice from [facility]. Please contact [facility] at [phone] with questions or concerns regarding your invoice.   Our billing staff will not be able to assist you with questions regarding bills from these companies.  You will be contacted with the lab results as soon as they are available. The fastest way to get your results is to activate your My Chart account. Instructions are located on the last page of this paperwork. If you have not heard from [place] regarding the results in 2 weeks, please contact this office.         Return in about 3 months (around 08/12/2015).   HOPPER,DAVID, MD 05/13/2015

## 2015-05-13 NOTE — Patient Instructions (Addendum)
Take over-the-counter Claritin or Allegra 1 daily to try and reduce the postnasal drainage. That is causing some of the cough.  Take the benzonatate cough pills one or 2 pills 3 times daily as needed for daytime cough  Drink lots of water which helps keep the secretions thinner  Take Tylenol 500 mg 2 tablets 3 times daily as needed for pain in the flank. If you develop worse pain please return.  Take the pantoprazole 40 mg 1 daily for your stomach.  Take over-the-counter iron one twice daily for one month, then 1 once daily for 2 months. Return in about July to get your blood count rechecked.  Return at anytime if needed    IF you received an x-ray today, you will receive an invoice from [facility]. Please contact [facility] at [phone] with questions or concerns regarding your invoice.   IF you received labwork today, you will receive an invoice from [facility]. Please contact [facility] at [phone] with questions or concerns regarding your invoice.   Our billing staff will not be able to assist you with questions regarding bills from these companies.  You will be contacted with the lab results as soon as they are available. The fastest way to get your results is to activate your My Chart account. Instructions are located on the last page of this paperwork. If you have not heard from [place] regarding the results in 2 weeks, please contact this office.

## 2015-09-30 ENCOUNTER — Ambulatory Visit (INDEPENDENT_AMBULATORY_CARE_PROVIDER_SITE_OTHER): Payer: BC Managed Care – PPO | Admitting: Physician Assistant

## 2015-09-30 VITALS — BP 152/98 | HR 85 | Temp 98.1°F | Resp 16 | Ht 60.0 in | Wt 265.0 lb

## 2015-09-30 DIAGNOSIS — M778 Other enthesopathies, not elsewhere classified: Secondary | ICD-10-CM

## 2015-09-30 DIAGNOSIS — M7581 Other shoulder lesions, right shoulder: Secondary | ICD-10-CM | POA: Diagnosis not present

## 2015-09-30 DIAGNOSIS — I10 Essential (primary) hypertension: Secondary | ICD-10-CM

## 2015-09-30 MED ORDER — IBUPROFEN 800 MG PO TABS: 800.0000 mg | ORAL_TABLET | Freq: Three times a day (TID) | ORAL | 0 refills | Status: DC | PRN

## 2015-09-30 NOTE — Patient Instructions (Addendum)
Monitor your Blood pressure at home - we want it to be less than 140/90 today it was 152/98  Take the medication that I gave you today for your pain and I want to see you back in 2 weeks to recheck your BP and then pain.  Use ice on your shoulder

## 2015-09-30 NOTE — Progress Notes (Signed)
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  She has been using an arthritis rub which helps for a little.  She has more pain when she abducts her shoulder - she has aching pain when she is not using it and at night but when she uses it is a sharp pain - she pain now is going into her right wrist when she uses her arm.  She has something like this in the past when she has a job with a lot of arm use.  Pt is taking her BP medication but she has had a lot of stress at work and thinks that is why her BP is up.  Review of Systems  Constitutional: Negative for chills and fever.    Patient Active Problem List   Diagnosis Date Noted  . Essential hypertension 05/06/2014  . Hyperlipidemia 05/06/2014  . Strangulated recurrent ventral incisional hernia, s/p small intestine resection 09/22/2010  . Obesity, morbid, BMI >40 09/22/2010  . Edema of lower extremities 09/22/2010    Current Outpatient Prescriptions on File Prior to Visit  Medication Sig Dispense Refill  . atorvastatin (LIPITOR) 40 MG tablet Take 1 tablet (40 mg total) by mouth daily. 90 tablet 1  . amLODipine (NORVASC) 10 MG tablet Take 1 tablet (10 mg total) by mouth daily. 90 tablet 1  . pantoprazole (PROTONIX) 40 MG tablet Take 1 tablet (40 mg total) by mouth daily. 30 tablet 3   No current facility-administered medications on file prior to visit.     No Known Allergies  Pt patients past, family and social history were reviewed and updated.  Objective:  BP (!) 152/98   Pulse 85   Temp 98.1 F (36.7 C) (Oral)   Resp 16   Ht 5' (1.524 m)   Wt 265 lb (120.2 kg)   SpO2 97%   BMI 51.75 kg/m   Physical Exam  Constitutional: She is oriented to person, place, and time and well-developed, well-nourished, and  in no distress.  HENT:  Head: Normocephalic and atraumatic.  Right Ear: Hearing and external ear normal.  Left Ear: Hearing and external ear normal.  Eyes: Conjunctivae are normal.  Neck: Normal range of motion.  Pulmonary/Chest: Effort normal.  Musculoskeletal:       Right shoulder: She exhibits tenderness (lateral shoulder) and pain (with abduction of shoulder, internal rotation more than external rotation shoulder pain). She exhibits normal range of motion, no bony tenderness, no deformity and no laceration.       Left shoulder: Normal.       Cervical back: Normal.  Neurological: She is alert and oriented to person, place, and time. Gait normal.  Skin: Skin is warm and dry.  Psychiatric: Mood, memory, affect and judgment normal.  Vitals reviewed.   Assessment and Plan :  Right shoulder tendonitis - Plan: ibuprofen (ADVIL,MOTRIN) 800 MG tablet  Essential hypertension monitor at home - we discussed risk of NSAIDs with elevated BP but she will monitor at home and if her BP continues to rise she was RTC sooner than her 2 week check - she will ice her shoulder - motrin has worked really well for her in the past and we will try that 1st in hopes that she will not  need an injection as she does not wish to have that done today.  Windell Hummingbird PA-C  [facility] 09/30/2015 4:58 PM

## 2015-11-01 ENCOUNTER — Other Ambulatory Visit: Payer: Self-pay | Admitting: Physician Assistant

## 2015-11-01 DIAGNOSIS — Z1231 Encounter for screening mammogram for malignant neoplasm of breast: Secondary | ICD-10-CM

## 2015-11-08 ENCOUNTER — Ambulatory Visit
Admission: RE | Admit: 2015-11-08 | Discharge: 2015-11-08 | Disposition: A | Discharge status: Home / Self Care | Payer: BC Managed Care – PPO | Source: Ambulatory Visit | Attending: Physician Assistant | Admitting: Physician Assistant

## 2015-11-08 DIAGNOSIS — Z1231 Encounter for screening mammogram for malignant neoplasm of breast: Secondary | ICD-10-CM

## 2015-11-22 ENCOUNTER — Telehealth: Payer: Self-pay

## 2015-11-22 ENCOUNTER — Other Ambulatory Visit: Payer: Self-pay | Admitting: Family Medicine

## 2015-11-22 DIAGNOSIS — K219 Gastro-esophageal reflux disease without esophagitis: Secondary | ICD-10-CM

## 2015-11-22 DIAGNOSIS — K30 Functional dyspepsia: Secondary | ICD-10-CM

## 2015-11-22 NOTE — Telephone Encounter (Signed)
Pr dropped off handicap form to b completed     Best phone [phone]

## 2015-11-22 NOTE — Telephone Encounter (Signed)
Form is in Windell Hummingbird box in lounge

## 2015-11-23 NOTE — Telephone Encounter (Signed)
Done

## 2015-11-24 NOTE — Telephone Encounter (Signed)
LMVM stating that patient's form is completed, and can be picked up at her convenience.

## 2015-12-29 ENCOUNTER — Other Ambulatory Visit: Payer: Self-pay | Admitting: Physician Assistant

## 2015-12-29 DIAGNOSIS — K30 Functional dyspepsia: Secondary | ICD-10-CM

## 2015-12-29 DIAGNOSIS — K219 Gastro-esophageal reflux disease without esophagitis: Secondary | ICD-10-CM

## 2016-01-12 ENCOUNTER — Ambulatory Visit (INDEPENDENT_AMBULATORY_CARE_PROVIDER_SITE_OTHER): Payer: BC Managed Care – PPO

## 2016-01-12 ENCOUNTER — Encounter: Payer: Self-pay | Admitting: Physician Assistant

## 2016-01-12 ENCOUNTER — Ambulatory Visit (INDEPENDENT_AMBULATORY_CARE_PROVIDER_SITE_OTHER): Payer: BC Managed Care – PPO | Admitting: Physician Assistant

## 2016-01-12 VITALS — BP 148/98 | HR 83 | Temp 98.8°F | Resp 16 | Ht 60.0 in | Wt 248.0 lb

## 2016-01-12 DIAGNOSIS — R059 Cough, unspecified: Secondary | ICD-10-CM

## 2016-01-12 DIAGNOSIS — R0981 Nasal congestion: Secondary | ICD-10-CM | POA: Diagnosis not present

## 2016-01-12 DIAGNOSIS — I10 Essential (primary) hypertension: Secondary | ICD-10-CM

## 2016-01-12 DIAGNOSIS — R062 Wheezing: Secondary | ICD-10-CM

## 2016-01-12 DIAGNOSIS — R05 Cough: Secondary | ICD-10-CM

## 2016-01-12 LAB — POCT CBC
Granulocyte percent: 52.8 %G (ref 37–80)
HCT, POC: 35 % — AB (ref 37.7–47.9)
HEMOGLOBIN: 11.8 g/dL — AB (ref 12.2–16.2)
LYMPH, POC: 1.7 (ref 0.6–3.4)
MCH: 27.2 pg (ref 27–31.2)
MCHC: 33.8 g/dL (ref 31.8–35.4)
MCV: 80.5 fL (ref 80–97)
MID (cbc): 0.3 (ref 0–0.9)
MPV: 7.4 fL (ref 0–99.8)
PLATELET COUNT, POC: 223 10*3/uL (ref 142–424)
POC Granulocyte: 2.3 (ref 2–6.9)
POC LYMPH PERCENT: 40.4 %L (ref 10–50)
POC MID %: 6.8 %M (ref 0–12)
RBC: 4.34 M/uL (ref 4.04–5.48)
RDW, POC: 18.1 %
WBC: 4.3 10*3/uL — AB (ref 4.6–10.2)

## 2016-01-12 MED ORDER — AMLODIPINE BESYLATE 10 MG PO TABS: 10.0000 mg | ORAL_TABLET | Freq: Every day | ORAL | 0 refills | Status: DC

## 2016-01-12 MED ORDER — IPRATROPIUM BROMIDE 0.02 % IN SOLN
0.5000 mg | Freq: Once | RESPIRATORY_TRACT | Status: AC
  Administered 2016-01-12: 0.5 mg via RESPIRATORY_TRACT

## 2016-01-12 MED ORDER — FLUTICASONE PROPIONATE 50 MCG/ACT NA SUSP: 2.0000 | Freq: Every day | NASAL | 0 refills | Status: DC

## 2016-01-12 MED ORDER — ALBUTEROL SULFATE (2.5 MG/3ML) 0.083% IN NEBU
2.5000 mg | INHALATION_SOLUTION | Freq: Once | RESPIRATORY_TRACT | Status: AC
  Administered 2016-01-12: 2.5 mg via RESPIRATORY_TRACT

## 2016-01-12 MED ORDER — BENZONATATE 100 MG PO CAPS: 100.0000 mg | ORAL_CAPSULE | Freq: Three times a day (TID) | ORAL | 0 refills | Status: DC | PRN

## 2016-01-12 MED ORDER — ALBUTEROL SULFATE 108 (90 BASE) MCG/ACT IN AEPB: 2.0000 | INHALATION_SPRAY | Freq: Four times a day (QID) | RESPIRATORY_TRACT | 0 refills | Status: DC | PRN

## 2016-01-12 NOTE — Patient Instructions (Addendum)
Please go to [address] and have a chest xray performed. I will contact you with the results and potential added on medications. Please use flonase daily for nasal congestion, use tessalon perles to help decrease cough during the day, and OTC delsym for cough at night. You can use the albuterol inhaler for shortness of breath or wheezing every 4-6 hours over the next 24 hours if you need to. After that, please just use as needed.  -Return to clinic if symptoms worsen, do not improve in 5-7 days, or as needed   IF you received an x-ray today, you will receive an invoice from [facility]. Please contact [facility] at [phone] with questions or concerns regarding your invoice.   IF you received labwork today, you will receive an invoice from [facility]. Please contact [facility] at [phone] with questions or concerns regarding your invoice.   Our billing staff will not be able to assist you with questions regarding bills from these companies.  You will be contacted with the lab results as soon as they are available. The fastest way to get your results is to activate your My Chart account. Instructions are located on the last page of this paperwork. If you have not heard from [place] regarding the results in 2 weeks, please contact this office.      Cough, Adult Introduction A cough helps to clear your throat and lungs. A cough may last only 2-3 weeks (acute), or it may last longer than 8 weeks (chronic). Many different things can cause a cough. A cough may be a sign of an illness or another medical condition. Follow these instructions at home:  Pay attention to any changes in your cough.  Take medicines only as told by your doctor.  If you were prescribed an antibiotic medicine, take it as told by your doctor. Do not stop taking it even if you start to feel better.  Talk with your doctor before you try using a cough medicine.  Drink enough fluid to keep your pee (urine)  clear or pale yellow.  If the air is dry, use a cold steam vaporizer or humidifier in your home.  Stay away from things that make you cough at work or at home.  If your cough is worse at night, try using extra pillows to raise your head up higher while you sleep.  Do not smoke, and try not to be around smoke. If you need help quitting, ask your doctor.  Do not have caffeine.  Do not drink alcohol.  Rest as needed. Contact a doctor if:  You have new problems (symptoms).  You cough up yellow fluid (pus).  Your cough does not get better after 2-3 weeks, or your cough gets worse.  Medicine does not help your cough and you are not sleeping well.  You have pain that gets worse or pain that is not helped with medicine.  You have a fever.  You are losing weight and you do not know why.  You have night sweats. Get help right away if:  You cough up blood.  You have trouble breathing.  Your heartbeat is very fast. This information is not intended to replace advice given to you by your health care provider. Make sure you discuss any questions you have with your health care provider. Document Released: 09/15/2010 Document Revised: 06/10/2015 Document Reviewed: 03/11/2014  2017 [employer]

## 2016-01-12 NOTE — Progress Notes (Signed)
MRN: [id number] DOB: 1955/12/07  Subjective:   [name] is a 60 y.o. female presenting for chief complaint of Sinusitis (chest congestion/ x 5 days); Nasal Congestion (x 5 days); Cough; and Medication Refill (amlodipine) .  Reports 5 day history of subjective fever, rhinorrhea, productive cough (no hemoptysis), chest tightness and myalgia, night sweats, chills and fatigue. She was on a family vacation at [facility] for Christmas and notes she could not even leave the condo due to feeling so bad. Has tried nyquil, alkaseltzer plus with mild relief. Denies sinus congestion, itchy watery eyes, ear pain, wheezing and shortness of breath, nausea, vomiting, abdominal pain and diarrhea. Has had sick contact with coworkers as she works in [employer]. No history of seasonal allergies or asthma. Patient has not had flu shot this season. Denies smoking and alcohol use. Has had history of pneumonia and bronchitis.   Pt needs a refill for amlodipine as she is about to run out in a week. She was last seen for HTN recheck on 05/06/14.   [name] has a current medication list which includes the following prescription(s): atorvastatin, albuterol sulfate, amlodipine, benzonatate, fluticasone, ibuprofen, and pantoprazole. Also has No Known Allergies.  [name]  has a past medical history of Abdominal distention; Abdominal pain; Arthritis; Blood transfusion without reported diagnosis; Hernia; Hyperlipidemia; Hypertension; Leg swelling; and Strangulated recurrent ventral incisional hernia, s/p small intestine resection (09/22/2010). Also  has a past surgical history that includes bowel obstruction (09/12/2010); Hernia repair (2007); Hernia repair [phone]); Small intestine surgery (09/12/10); and Abdominal hysterectomy.     Social History   Social History  . Marital status: Widowed    Spouse name: N/A  . Number of children: N/A  . Years of education: N/A   Occupational History  . Not on file.    Social History Main Topics  . Smoking status: Never Smoker  . Smokeless tobacco: Never Used  . Alcohol use No  . Drug use: No  . Sexual activity: No   Other Topics Concern  . Not on file   Social History Narrative  . No narrative on file    Objective:   Vitals: BP (!) 148/98   Pulse 83   Temp 98.8 F (37.1 C) (Oral)   Resp 16   Ht 5' (1.524 m)   Wt 248 lb (112.5 kg)   SpO2 98%   BMI 48.43 kg/m   Physical Exam  Constitutional: She is oriented to person, place, and time. She appears well-developed and well-nourished.  HENT:  Head: Normocephalic and atraumatic.  Right Ear: Tympanic membrane, external ear and ear canal normal.  Left Ear: Tympanic membrane, external ear and ear canal normal.  Nose: Mucosal edema ( mild) and rhinorrhea ( mild) present. Right sinus exhibits no maxillary sinus tenderness and no frontal sinus tenderness. Left sinus exhibits no maxillary sinus tenderness and no frontal sinus tenderness.  Mouth/Throat: Uvula is midline, oropharynx is clear and moist and mucous membranes are normal. No tonsillar exudate.  Eyes: Conjunctivae are normal.  Neck: Normal range of motion.  Cardiovascular: Normal rate, regular rhythm and normal heart sounds.   Pulmonary/Chest: Effort normal. She has wheezes (cleared with coughing) in the right middle field. She has rales in the right middle field.  Lymphadenopathy:       Head (right side): No submental, no submandibular, no tonsillar, no preauricular, no posterior auricular and no occipital adenopathy present.       Head (left side): No submental, no submandibular, no tonsillar, no  preauricular, no posterior auricular and no occipital adenopathy present.    She has no cervical adenopathy.       Right: No supraclavicular adenopathy present.       Left: No supraclavicular adenopathy present.  Neurological: She is alert and oriented to person, place, and time.  Skin: Skin is warm and dry.  Psychiatric: She has a normal  mood and affect.  Vitals reviewed.  Results for orders placed or performed in visit on 01/12/16 (from the past 24 hour(s))  POCT CBC     Status: Abnormal   Collection Time: 01/12/16 11:48 AM  Result Value Ref Range   WBC 4.3 (A) 4.6 - 10.2 K/uL   Lymph, poc 1.7 0.6 - 3.4   POC LYMPH PERCENT 40.4 10 - 50 %L   MID (cbc) 0.3 0 - 0.9   POC MID % 6.8 0 - 12 %M   POC Granulocyte 2.3 2 - 6.9   Granulocyte percent 52.8 37 - 80 %G   RBC 4.34 4.04 - 5.48 M/uL   Hemoglobin 11.8 (A) 12.2 - 16.2 g/dL   HCT, POC 35.0 (A) 37.7 - 47.9 %   MCV 80.5 80 - 97 fL   MCH, POC 27.2 27 - 31.2 pg   MCHC 33.8 31.8 - 35.4 g/dL   RDW, POC 18.1 %   Platelet Count, POC 223 142 - 424 K/uL   MPV 7.4 0 - 99.8 fL    Dg Chest 2 View  Result Date: 01/12/2016 CLINICAL DATA:  Cough for 5 days, rales, wheezing EXAM: CHEST  2 VIEW COMPARISON:  07/13/2011 FINDINGS: Cardiomediastinal silhouette is stable. No infiltrate or pleural effusion. No pulmonary edema. Mild perihilar bronchitic changes. Degenerative changes thoracic spine. IMPRESSION: No infiltrate or pulmonary edema. Mild perihilar bronchitic changes. Electronically Signed   By: Lahoma Crocker M.D.   On: 01/12/2016 11:44    Post breathing treatment, pt notes she feels better. Wheezing still auscultated on lung exam.   Assessment and Plan :  1. Cough -Likely viral etiology, will treat symptomatically. Return to clinic if symptoms worsen, do not improve in 5-7 days, or as needed - POCT CBC - DG Chest 2 View; Future - benzonatate (TESSALON) 100 MG capsule; Take 1-2 capsules (100-200 mg total) by mouth 3 (three) times daily as needed for cough.  Dispense: 40 capsule; Refill: 0  2. Wheezing - albuterol (PROVENTIL) (2.5 MG/3ML) 0.083% nebulizer solution 2.5 mg; Take 3 mLs (2.5 mg total) by nebulization once. - ipratropium (ATROVENT) nebulizer solution 0.5 mg; Take 2.5 mLs (0.5 mg total) by nebulization once. - Albuterol Sulfate (PROAIR RESPICLICK) 098 (90 Base)  MCG/ACT AEPB; Inhale 2 puffs into the lungs every 6 (six) hours as needed. For shortness of breath or wheezing.  Dispense: 1 each; Refill: 0  3. Essential hypertension -Given a courtesy refill, pt instructed she needs to come back for a hypertension follow up visit to have proper lab work.  - amLODipine (NORVASC) 10 MG tablet; Take 1 tablet (10 mg total) by mouth daily.  Dispense: 30 tablet; Refill: 0  4. Nasal congestion - fluticasone (FLONASE) 50 MCG/ACT nasal spray; Place 2 sprays into both nostrils daily.  Dispense: 16 g; Refill: 0  Tenna Delaine, PA-C  [facility] 01/12/2016 12:21 PM

## 2016-01-13 ENCOUNTER — Telehealth: Payer: Self-pay

## 2016-01-13 DIAGNOSIS — R05 Cough: Secondary | ICD-10-CM

## 2016-01-13 DIAGNOSIS — R059 Cough, unspecified: Secondary | ICD-10-CM

## 2016-01-13 MED ORDER — HYDROCOD POLST-CPM POLST ER 10-8 MG/5ML PO SUER: 5.0000 mL | Freq: Two times a day (BID) | ORAL | 0 refills | Status: DC | PRN

## 2016-01-13 NOTE — Telephone Encounter (Signed)
Patient would like to have [place] send her in some kind of cough syrup so that she can sleep at night. Patient states she has been up all night coughing and she hasn't gotten any rest.  Her call back number is [phone] and she uses the [facility] on [address]

## 2016-01-13 NOTE — Telephone Encounter (Signed)
Seen yesterday

## 2016-01-13 NOTE — Telephone Encounter (Signed)
I can give her a prescription for tussionex but it cannot be eprescribed so she will need to stop by our office tomorrow and pick it up.  Please let her know that this can make her very drowsy so she should only take it at night time.If she gets up a lot to go to the bathroom at night, make sure she sits up on the side of her bed and gets oriented to the room and does not just roll out of bed to go to the bathroom. If she is not any better in a few days, have her call back as she may need an antibiotic at that time. Thanks!

## 2016-01-14 MED ORDER — BENZONATATE 100 MG PO CAPS: 100.0000 mg | ORAL_CAPSULE | Freq: Three times a day (TID) | ORAL | 0 refills | Status: DC | PRN

## 2016-01-14 MED ORDER — HYDROCOD POLST-CPM POLST ER 10-8 MG/5ML PO SUER: 5.0000 mL | Freq: Two times a day (BID) | ORAL | 0 refills | Status: DC | PRN

## 2016-01-14 NOTE — Telephone Encounter (Signed)
I had to re-print a 2nd time from different computer because the first one didn't print.

## 2016-01-14 NOTE — Telephone Encounter (Signed)
Checked w/Brittany when the Rx she printed was not found this morning for her signature. She advised to re-print so that she can sign for pt. Done

## 2016-01-14 NOTE — Telephone Encounter (Signed)
Pt aware Rx is ready. 

## 2016-01-14 NOTE — Addendum Note (Signed)
Addended by: Elwyn Reach A on: 01/14/2016 01:17 PM   Modules accepted: Orders

## 2016-10-19 ENCOUNTER — Other Ambulatory Visit: Payer: Self-pay | Admitting: Physician Assistant

## 2016-10-19 DIAGNOSIS — Z1239 Encounter for other screening for malignant neoplasm of breast: Secondary | ICD-10-CM

## 2016-11-08 ENCOUNTER — Ambulatory Visit
Admission: RE | Admit: 2016-11-08 | Discharge: 2016-11-08 | Disposition: A | Discharge status: Home / Self Care | Payer: BC Managed Care – PPO | Source: Ambulatory Visit | Attending: Physician Assistant | Admitting: Physician Assistant

## 2016-11-08 DIAGNOSIS — Z1239 Encounter for other screening for malignant neoplasm of breast: Secondary | ICD-10-CM

## 2016-11-21 ENCOUNTER — Encounter: Payer: Self-pay | Admitting: Urgent Care

## 2016-11-21 ENCOUNTER — Ambulatory Visit (INDEPENDENT_AMBULATORY_CARE_PROVIDER_SITE_OTHER): Payer: BC Managed Care – PPO

## 2016-11-21 ENCOUNTER — Ambulatory Visit: Payer: BC Managed Care – PPO | Admitting: Urgent Care

## 2016-11-21 VITALS — BP 179/102 | HR 86 | Temp 98.4°F | Resp 18 | Ht 60.0 in | Wt 241.6 lb

## 2016-11-21 DIAGNOSIS — M79645 Pain in left finger(s): Secondary | ICD-10-CM | POA: Diagnosis not present

## 2016-11-21 DIAGNOSIS — M25512 Pain in left shoulder: Secondary | ICD-10-CM

## 2016-11-21 DIAGNOSIS — M79644 Pain in right finger(s): Secondary | ICD-10-CM

## 2016-11-21 DIAGNOSIS — I10 Essential (primary) hypertension: Secondary | ICD-10-CM

## 2016-11-21 DIAGNOSIS — R030 Elevated blood-pressure reading, without diagnosis of hypertension: Secondary | ICD-10-CM | POA: Diagnosis not present

## 2016-11-21 DIAGNOSIS — M19012 Primary osteoarthritis, left shoulder: Secondary | ICD-10-CM

## 2016-11-21 MED ORDER — AMLODIPINE BESYLATE 10 MG PO TABS: 10.0000 mg | ORAL_TABLET | Freq: Every day | ORAL | 0 refills | Status: DC

## 2016-11-21 NOTE — Progress Notes (Signed)
MRN: [id number] DOB: 18-Dec-1955  Subjective:   [name] is a 61 y.o. female presenting for chief complaint of Joint Pain  Reports 2 week history of recurrent left shoulder pain. Also has had left 5th and right 1st finger pain. Works in school system with cafeteria, does a lot of lifting, uses her arms and shoulder a lot. Has used otc topical arthritis cream, occasional Advil with minimal relief. Denies trauma, falls, redness, swelling, warmth.   [name] has a current medication list which includes the following prescription(s): amlodipine and pantoprazole. Also has No Known Allergies.  [name]  has a past medical history of Abdominal distention, Abdominal pain, Arthritis, Blood transfusion without reported diagnosis, Hernia, Hyperlipidemia, Hypertension, Leg swelling, and Strangulated recurrent ventral incisional hernia, s/p small intestine resection (09/22/2010). Also  has a past surgical history that includes bowel obstruction (09/12/2010); Hernia repair (2007); Hernia repair [phone]); Small intestine surgery (09/12/10); and Abdominal hysterectomy.  Objective:   Vitals: BP (!) 179/102   Pulse 86   Temp 98.4 F (36.9 C) (Oral)   Resp 18   Ht 5' (1.524 m)   Wt 241 lb 9.6 oz (109.6 kg)   SpO2 98%   BMI 47.18 kg/m   BP Readings from Last 3 Encounters:  11/21/16 (!) 179/102  01/12/16 (!) 148/98  09/30/15 (!) 152/98     Physical Exam  Constitutional: She is oriented to person, place, and time. She appears well-developed and well-nourished.  Cardiovascular: Normal rate, regular rhythm and intact distal pulses. Exam reveals no gallop and no friction rub.  No murmur heard. Pulmonary/Chest: Effort normal. No respiratory distress. She has no wheezes. She has no rales.  Musculoskeletal:       Left shoulder: She exhibits decreased range of motion (abduction of arm greater than 90 degrees), tenderness (over AC joint, deltoids, trapezius) and spasm (trapezius). She exhibits no swelling, no  effusion, no crepitus, no deformity and normal strength.  Neurological: She is alert and oriented to person, place, and time.   Dg Shoulder Left  Result Date: 11/21/2016 CLINICAL DATA:  Left shoulder pain.  No known injury. EXAM: LEFT SHOULDER - 2+ VIEW COMPARISON:  None. FINDINGS: No acute bony or joint abnormality is identified. Moderately severe appearing acromioclavicular osteoarthritis is noted. The glenohumeral joint is unremarkable. Imaged left lung and ribs appear normal. IMPRESSION: No acute finding. Moderately severe acromioclavicular osteoarthritis. Electronically Signed   By: Inge Rise M.D.   On: 11/21/2016 15:17   Dg Hand Complete Left  Result Date: 11/21/2016 CLINICAL DATA:  Little finger pain. EXAM: LEFT HAND - COMPLETE 3+ VIEW COMPARISON:  None. FINDINGS: There is no fracture or dislocation. There is slight diffuse narrowing of the IP joints of the digits. Focal arthritis at the distal radioulnar joint. Mild arthritic changes of the first [facility] joint. IMPRESSION: No acute abnormalities.  Arthritic changes as described. Electronically Signed   By: Lorriane Shire M.D.   On: 11/21/2016 15:19    Assessment and Plan :   1. Acute pain of left shoulder 2. Arthritis of left shoulder region 3. Finger pain, left 4. Pain of right thumb - Will refer to ortho for consideration of injections. BP is very concerning and kidney function is unknown so we will hold off on using NSAIDs. Patient is also not interested in oral steroid course which is reasonable given that her blood sugar levels, diabetes status is unknown. In the meantime, use APAP, icing, rest.  5. Essential hypertension 6. Elevated blood pressure reading - Counseled patient on  risks of uncontrolled HTN. She is to restart her amlodipine. Emphasized need for significant dietary modifications. Labs pending. Return-to-clinic precautions discussed, patient verbalized understanding. Otherwise, f/u in 4 weeks. - Comprehensive  metabolic panel  7. Morbid obesity ([place]) - Hemoglobin A1c pending  Virginia Eagles, PA-C [facility] [phone] 11/21/2016  2:40 PM

## 2016-11-21 NOTE — Patient Instructions (Addendum)
You may take 500-1000mg  of Tylenol 3 times daily for pain and inflammation associated with your arthritis.    Arthritis Arthritis is a term that is commonly used to refer to joint pain or joint disease. There are more than 100 types of arthritis. What are the causes? The most common cause of this condition is wear and tear of a joint. Other causes include:  Gout.  Inflammation of a joint.  An infection of a joint.  Sprains and other injuries near the joint.  A drug reaction or allergic reaction.  In some cases, the cause may not be known. What are the signs or symptoms? The main symptom of this condition is pain in the joint with movement. Other symptoms include:  Redness, swelling, or stiffness at a joint.  Warmth coming from the joint.  Fever.  Overall feeling of illness.  How is this diagnosed? This condition may be diagnosed with a physical exam and tests, including:  Blood tests.  Urine tests.  Imaging tests, such as MRI, X-rays, or a CT scan.  Sometimes, fluid is removed from a joint for testing. How is this treated? Treatment for this condition may involve:  Treatment of the cause, if it is known.  Rest.  Raising (elevating) the joint.  Applying cold or hot packs to the joint.  Medicines to improve symptoms and reduce inflammation.  Injections of a steroid such as cortisone into the joint to help reduce pain and inflammation.  Depending on the cause of your arthritis, you may need to make lifestyle changes to reduce stress on your joint. These changes may include exercising more and losing weight. Follow these instructions at home: Medicines  Take over-the-counter and prescription medicines only as told by your health care provider.  Do not take aspirin to relieve pain if gout is suspected. Activity  Rest your joint if told by your health care provider. Rest is important when your disease is active and your joint feels painful, swollen, or  stiff.  Avoid activities that make the pain worse. It is important to balance activity with rest.  Exercise your joint regularly with range-of-motion exercises as told by your health care provider. Try doing low-impact exercise, such as: ? Swimming. ? Water aerobics. ? Biking. ? Walking. Joint Care   If your joint is swollen, keep it elevated if told by your health care provider.  If your joint feels stiff in the morning, try taking a warm shower.  If directed, apply heat to the joint. If you have diabetes, do not apply heat without permission from your health care provider. ? Put a towel between the joint and the hot pack or heating pad. ? Leave the heat on the area for 20-30 minutes.  If directed, apply ice to the joint: ? Put ice in a plastic bag. ? Place a towel between your skin and the bag. ? Leave the ice on for 20 minutes, 2-3 times per day.  Keep all follow-up visits as told by your health care provider. This is important. Contact a health care provider if:  The pain gets worse.  You have a fever. Get help right away if:  You develop severe joint pain, swelling, or redness.  Many joints become painful and swollen.  You develop severe back pain.  You develop severe weakness in your leg.  You cannot control your bladder or bowels. This information is not intended to replace advice given to you by your health care provider. Make sure you discuss any  questions you have with your health care provider. Document Released: 02/10/2004 Document Revised: 06/10/2015 Document Reviewed: 03/30/2014 Elsevier Interactive Patient Education  2018 [employer].    Hypertension Hypertension, commonly called high blood pressure, is when the force of blood pumping through the arteries is too strong. The arteries are the blood vessels that carry blood from the heart throughout the body. Hypertension forces the heart to work harder to pump blood and may cause arteries to become  narrow or stiff. Having untreated or uncontrolled hypertension can cause heart attacks, strokes, kidney disease, and other problems. A blood pressure reading consists of a higher number over a lower number. Ideally, your blood pressure should be below 120/80. The first ("top") number is called the systolic pressure. It is a measure of the pressure in your arteries as your heart beats. The second ("bottom") number is called the diastolic pressure. It is a measure of the pressure in your arteries as the heart relaxes. What are the causes? The cause of this condition is not known. What increases the risk? Some risk factors for high blood pressure are under your control. Others are not. Factors you can change  Smoking.  Having type 2 diabetes mellitus, high cholesterol, or both.  Not getting enough exercise or physical activity.  Being overweight.  Having too much fat, sugar, calories, or salt (sodium) in your diet.  Drinking too much alcohol. Factors that are difficult or impossible to change  Having chronic kidney disease.  Having a family history of high blood pressure.  Age. Risk increases with age.  Race. You may be at higher risk if you are African-American.  Gender. Men are at higher risk than women before age 36. After age 66, women are at higher risk than men.  Having obstructive sleep apnea.  Stress. What are the signs or symptoms? Extremely high blood pressure (hypertensive crisis) may cause:  Headache.  Anxiety.  Shortness of breath.  Nosebleed.  Nausea and vomiting.  Severe chest pain.  Jerky movements you cannot control (seizures).  How is this diagnosed? This condition is diagnosed by measuring your blood pressure while you are seated, with your arm resting on a surface. The cuff of the blood pressure monitor will be placed directly against the skin of your upper arm at the level of your heart. It should be measured at least twice using the same arm.  Certain conditions can cause a difference in blood pressure between your right and left arms. Certain factors can cause blood pressure readings to be lower or higher than normal (elevated) for a short period of time:  When your blood pressure is higher when you are in a health care provider's office than when you are at home, this is called white coat hypertension. Most people with this condition do not need medicines.  When your blood pressure is higher at home than when you are in a health care provider's office, this is called masked hypertension. Most people with this condition may need medicines to control blood pressure.  If you have a high blood pressure reading during one visit or you have normal blood pressure with other risk factors:  You may be asked to return on a different day to have your blood pressure checked again.  You may be asked to monitor your blood pressure at home for 1 week or longer.  If you are diagnosed with hypertension, you may have other blood or imaging tests to help your health care provider understand your overall risk  for other conditions. How is this treated? This condition is treated by making healthy lifestyle changes, such as eating healthy foods, exercising more, and reducing your alcohol intake. Your health care provider may prescribe medicine if lifestyle changes are not enough to get your blood pressure under control, and if:  Your systolic blood pressure is above 130.  Your diastolic blood pressure is above 80.  Your personal target blood pressure may vary depending on your medical conditions, your age, and other factors. Follow these instructions at home: Eating and drinking  Eat a diet that is high in fiber and potassium, and low in sodium, added sugar, and fat. An example eating plan is called the DASH (Dietary Approaches to Stop Hypertension) diet. To eat this way: ? Eat plenty of fresh fruits and vegetables. Try to fill half of your plate at  each meal with fruits and vegetables. ? Eat whole grains, such as whole wheat pasta, brown rice, or whole grain bread. Fill about one quarter of your plate with whole grains. ? Eat or drink low-fat dairy products, such as skim milk or low-fat yogurt. ? Avoid fatty cuts of meat, processed or cured meats, and poultry with skin. Fill about one quarter of your plate with lean proteins, such as fish, chicken without skin, beans, eggs, and tofu. ? Avoid premade and processed foods. These tend to be higher in sodium, added sugar, and fat.  Reduce your daily sodium intake. Most people with hypertension should eat less than 1,500 mg of sodium a day.  Limit alcohol intake to no more than 1 drink a day for nonpregnant women and 2 drinks a day for men. One drink equals 12 oz of beer, 5 oz of wine, or 1 oz of hard liquor. Lifestyle  Work with your health care provider to maintain a healthy body weight or to lose weight. Ask what an ideal weight is for you.  Get at least 30 minutes of exercise that causes your heart to beat faster (aerobic exercise) most days of the week. Activities may include walking, swimming, or biking.  Include exercise to strengthen your muscles (resistance exercise), such as pilates or lifting weights, as part of your weekly exercise routine. Try to do these types of exercises for 30 minutes at least 3 days a week.  Do not use any products that contain nicotine or tobacco, such as cigarettes and e-cigarettes. If you need help quitting, ask your health care provider.  Monitor your blood pressure at home as told by your health care provider.  Keep all follow-up visits as told by your health care provider. This is important. Medicines  Take over-the-counter and prescription medicines only as told by your health care provider. Follow directions carefully. Blood pressure medicines must be taken as prescribed.  Do not skip doses of blood pressure medicine. Doing this puts you at risk  for problems and can make the medicine less effective.  Ask your health care provider about side effects or reactions to medicines that you should watch for. Contact a health care provider if:  You think you are having a reaction to a medicine you are taking.  You have headaches that keep coming back (recurring).  You feel dizzy.  You have swelling in your ankles.  You have trouble with your vision. Get help right away if:  You develop a severe headache or confusion.  You have unusual weakness or numbness.  You feel faint.  You have severe pain in your chest or abdomen.  You vomit repeatedly.  You have trouble breathing. Summary  Hypertension is when the force of blood pumping through your arteries is too strong. If this condition is not controlled, it may put you at risk for serious complications.  Your personal target blood pressure may vary depending on your medical conditions, your age, and other factors. For most people, a normal blood pressure is less than 120/80.  Hypertension is treated with lifestyle changes, medicines, or a combination of both. Lifestyle changes include weight loss, eating a healthy, low-sodium diet, exercising more, and limiting alcohol. This information is not intended to replace advice given to you by your health care provider. Make sure you discuss any questions you have with your health care provider. Document Released: 01/02/2005 Document Revised: 12/01/2015 Document Reviewed: 12/01/2015 Elsevier Interactive Patient Education  2018 [employer].     IF you received an x-ray today, you will receive an invoice from [facility]. Please contact [facility] at [phone] with questions or concerns regarding your invoice.   IF you received labwork today, you will receive an invoice from [facility]. Please contact [facility] at [phone] with questions or concerns regarding your invoice.   Our billing staff will not be able to  assist you with questions regarding bills from these companies.  You will be contacted with the lab results as soon as they are available. The fastest way to get your results is to activate your My Chart account. Instructions are located on the last page of this paperwork. If you have not heard from [place] regarding the results in 2 weeks, please contact this office.

## 2016-11-22 LAB — COMPREHENSIVE METABOLIC PANEL
ALBUMIN: 4 g/dL (ref 3.6–4.8)
ALK PHOS: 66 IU/L (ref 39–117)
ALT: 7 IU/L (ref 0–32)
AST: 15 IU/L (ref 0–40)
Albumin/Globulin Ratio: 1.5 (ref 1.2–2.2)
BILIRUBIN TOTAL: 0.2 mg/dL (ref 0.0–1.2)
BUN / CREAT RATIO: 9 — AB (ref 12–28)
BUN: 11 mg/dL (ref 8–27)
CHLORIDE: 108 mmol/L — AB (ref 96–106)
CO2: 21 mmol/L (ref 20–29)
Calcium: 8.5 mg/dL — ABNORMAL LOW (ref 8.7–10.3)
Creatinine, Ser: 1.24 mg/dL — ABNORMAL HIGH (ref 0.57–1.00)
GFR calc Af Amer: 54 mL/min/{1.73_m2} — ABNORMAL LOW (ref >59)
GFR calc non Af Amer: 47 mL/min/{1.73_m2} — ABNORMAL LOW (ref >59)
GLOBULIN, TOTAL: 2.6 g/dL (ref 1.5–4.5)
Glucose: 87 mg/dL (ref 65–99)
Potassium: 4.3 mmol/L (ref 3.5–5.2)
SODIUM: 144 mmol/L (ref 134–144)
Total Protein: 6.6 g/dL (ref 6.0–8.5)

## 2016-11-22 LAB — HEMOGLOBIN A1C
Est. average glucose Bld gHb Est-mCnc: 123 mg/dL
HEMOGLOBIN A1C: 5.9 % — AB (ref 4.8–5.6)

## 2016-12-19 ENCOUNTER — Encounter (INDEPENDENT_AMBULATORY_CARE_PROVIDER_SITE_OTHER): Payer: Self-pay | Admitting: Orthopaedic Surgery

## 2016-12-19 ENCOUNTER — Ambulatory Visit (INDEPENDENT_AMBULATORY_CARE_PROVIDER_SITE_OTHER): Payer: BC Managed Care – PPO | Admitting: Orthopaedic Surgery

## 2016-12-19 DIAGNOSIS — M7542 Impingement syndrome of left shoulder: Secondary | ICD-10-CM | POA: Diagnosis not present

## 2016-12-19 MED ORDER — METHYLPREDNISOLONE ACETATE 40 MG/ML IJ SUSP
40.0000 mg | INTRAMUSCULAR | Status: AC | PRN
  Administered 2016-12-19: 40 mg via INTRA_ARTICULAR

## 2016-12-19 MED ORDER — LIDOCAINE HCL 1 % IJ SOLN
3.0000 mL | INTRAMUSCULAR | Status: AC | PRN
  Administered 2016-12-19: 3 mL

## 2016-12-19 MED ORDER — BUPIVACAINE HCL 0.5 % IJ SOLN
3.0000 mL | INTRAMUSCULAR | Status: AC | PRN
  Administered 2016-12-19: 3 mL via INTRA_ARTICULAR

## 2016-12-19 NOTE — Progress Notes (Signed)
Office Visit Note   Patient: [name]           Date of Birth: 1955/12/12           MRN: [id number] Visit Date: 12/19/2016              Requested by: Mancel Bale, PA-C [address] [place], [place] [address] PCP: Mancel Bale, PA-C   Assessment & Plan: Visit Diagnoses:  1. Impingement syndrome of left shoulder     Plan: Impression is left shoulder impingement syndrome.  I reviewed the x-rays of her shoulder which shows a large distal clavicle osteophyte.  Subacromial injection was performed today.  Patient tolerated well.  Follow-up as needed.  Follow-Up Instructions: Return if symptoms worsen or fail to improve.   Orders:  No orders of the defined types were placed in this encounter.  No orders of the defined types were placed in this encounter.     Procedures: Large Joint Inj: L subacromial bursa on 12/19/2016 10:52 AM Indications: pain Details: 22 G needle  Arthrogram: No  Medications: 3 mL lidocaine 1 %; 3 mL bupivacaine 0.5 %; 40 mg methylPREDNISolone acetate 40 MG/ML Outcome: tolerated well, no immediate complications Patient was prepped and draped in the usual sterile fashion.       Clinical Data: No additional findings.   Subjective: Chief Complaint  Patient presents with  . Left Shoulder - Pain    Patient is doing left shoulder with recent flare.  She has had a previous subacromial injection many years ago which gave her good relief.  She has taken ibuprofen and Tylenol with partial relief.  She has difficulty resting at night due to the pain.  She does lift which does cause her worsening pain.  Denies any numbness or tingling or radicular symptoms.  Occasionally she will have some radiation of pain down into the elbow.    Review of Systems  Constitutional: Negative.   HENT: Negative.   Eyes: Negative.   Respiratory: Negative.   Cardiovascular: Negative.   Endocrine: Negative.   Musculoskeletal: Negative.   Neurological: Negative.    Hematological: Negative.   Psychiatric/Behavioral: Negative.   All other systems reviewed and are negative.    Objective: Vital Signs: There were no vitals taken for this visit.  Physical Exam  Constitutional: She is oriented to person, place, and time. She appears well-developed and well-nourished.  HENT:  Head: Normocephalic and atraumatic.  Eyes: EOM are normal.  Neck: Neck supple.  Pulmonary/Chest: Effort normal.  Abdominal: Soft.  Neurological: She is alert and oriented to person, place, and time.  Skin: Skin is warm. Capillary refill takes less than 2 seconds.  Psychiatric: She has a normal mood and affect. Her behavior is normal. Judgment and thought content normal.  Nursing note and vitals reviewed.   Ortho Exam Left shoulder exam shows positive impingement signs.  Rotator cuff testing is normal and with good strength.  Positive cross adduction. Specialty Comments:  No specialty comments available.  Imaging: No results found.   PMFS History: Patient Active Problem List   Diagnosis Date Noted  . Essential hypertension 05/06/2014  . Hyperlipidemia 05/06/2014  . Strangulated recurrent ventral incisional hernia, s/p small intestine resection 09/22/2010  . Obesity, morbid, BMI >40 09/22/2010  . Edema of lower extremities 09/22/2010   Past Medical History:  Diagnosis Date  . Abdominal distention   . Abdominal pain   . Arthritis   . Blood transfusion without reported diagnosis   . Hernia   .  Hyperlipidemia   . Hypertension   . Leg swelling   . Strangulated recurrent ventral incisional hernia, s/p small intestine resection 09/22/2010    Family History  Problem Relation Age of Onset  . Heart disease Mother        heart attack   . Heart disease Father        heart attack   . Hypertension Sister     Past Surgical History:  Procedure Laterality Date  . ABDOMINAL HYSTERECTOMY    . bowel obstruction  09/12/2010  . HERNIA REPAIR  2007   incarcerated, open  with biologic mesh  . HERNIA REPAIR  Aug2012   strangulated, redo open with biologic mesh  . SMALL INTESTINE SURGERY  09/12/10   SBR in incarcerated [place]   Social History   Occupational History  . Not on file  Tobacco Use  . Smoking status: Never Smoker  . Smokeless tobacco: Never Used  Substance and Sexual Activity  . Alcohol use: No  . Drug use: No  . Sexual activity: No    Birth control/protection: Abstinence

## 2016-12-20 ENCOUNTER — Telehealth (INDEPENDENT_AMBULATORY_CARE_PROVIDER_SITE_OTHER): Payer: Self-pay | Admitting: Orthopaedic Surgery

## 2016-12-20 NOTE — Telephone Encounter (Signed)
Patient called wanting to know if [facility] can write a prescription for IBPROFEN.  Please call patient to advise

## 2016-12-20 NOTE — Telephone Encounter (Signed)
See message below.  If so how many MG, quantity and instructions.

## 2016-12-20 NOTE — Telephone Encounter (Signed)
800 mg tid prn pain #30

## 2016-12-21 ENCOUNTER — Other Ambulatory Visit (INDEPENDENT_AMBULATORY_CARE_PROVIDER_SITE_OTHER): Payer: Self-pay | Admitting: Radiology

## 2016-12-21 MED ORDER — IBUPROFEN 800 MG PO TABS
800.0000 mg | ORAL_TABLET | Freq: Three times a day (TID) | ORAL | 0 refills | Status: DC
Start: 2016-12-21 — End: 2017-01-29

## 2016-12-21 NOTE — Telephone Encounter (Signed)
Rx sent to pharmacy   

## 2017-01-29 ENCOUNTER — Ambulatory Visit: Payer: BC Managed Care – PPO | Admitting: Family Medicine

## 2017-01-29 ENCOUNTER — Ambulatory Visit (INDEPENDENT_AMBULATORY_CARE_PROVIDER_SITE_OTHER): Payer: BC Managed Care – PPO

## 2017-01-29 ENCOUNTER — Encounter: Payer: Self-pay | Admitting: Family Medicine

## 2017-01-29 ENCOUNTER — Other Ambulatory Visit: Payer: Self-pay

## 2017-01-29 VITALS — BP 200/90 | HR 82 | Temp 98.3°F | Resp 16 | Ht 61.0 in | Wt 237.8 lb

## 2017-01-29 DIAGNOSIS — M25461 Effusion, right knee: Secondary | ICD-10-CM | POA: Diagnosis not present

## 2017-01-29 DIAGNOSIS — N182 Chronic kidney disease, stage 2 (mild): Secondary | ICD-10-CM

## 2017-01-29 DIAGNOSIS — I10 Essential (primary) hypertension: Secondary | ICD-10-CM

## 2017-01-29 DIAGNOSIS — M25561 Pain in right knee: Secondary | ICD-10-CM

## 2017-01-29 DIAGNOSIS — N2889 Other specified disorders of kidney and ureter: Secondary | ICD-10-CM

## 2017-01-29 MED ORDER — LISINOPRIL 5 MG PO TABS: 5.0000 mg | ORAL_TABLET | Freq: Every day | ORAL | 0 refills | Status: DC

## 2017-01-29 MED ORDER — MELOXICAM 7.5 MG PO TABS: 7.5000 mg | ORAL_TABLET | Freq: Every day | ORAL | 0 refills | Status: DC

## 2017-01-29 NOTE — Patient Instructions (Addendum)
IF you received an x-ray today, you will receive an invoice from [facility]. Please contact [facility] at [phone] with questions or concerns regarding your invoice.   IF you received labwork today, you will receive an invoice from [facility]. Please contact [facility] at [phone] with questions or concerns regarding your invoice.   Our billing staff will not be able to assist you with questions regarding bills from these companies.  You will be contacted with the lab results as soon as they are available. The fastest way to get your results is to activate your My Chart account. Instructions are located on the last page of this paperwork. If you have not heard from [place] regarding the results in 2 weeks, please contact this office.     Arthritis Arthritis is a term that is commonly used to refer to joint pain or joint disease. There are more than 100 types of arthritis. What are the causes? The most common cause of this condition is wear and tear of a joint. Other causes include:  Gout.  Inflammation of a joint.  An infection of a joint.  Sprains and other injuries near the joint.  A drug reaction or allergic reaction.  In some cases, the cause may not be known. What are the signs or symptoms? The main symptom of this condition is pain in the joint with movement. Other symptoms include:  Redness, swelling, or stiffness at a joint.  Warmth coming from the joint.  Fever.  Overall feeling of illness.  How is this diagnosed? This condition may be diagnosed with a physical exam and tests, including:  Blood tests.  Urine tests.  Imaging tests, such as MRI, X-rays, or a CT scan.  Sometimes, fluid is removed from a joint for testing. How is this treated? Treatment for this condition may involve:  Treatment of the cause, if it is known.  Rest.  Raising (elevating) the joint.  Applying cold or hot packs to the joint.  Medicines to improve  symptoms and reduce inflammation.  Injections of a steroid such as cortisone into the joint to help reduce pain and inflammation.  Depending on the cause of your arthritis, you may need to make lifestyle changes to reduce stress on your joint. These changes may include exercising more and losing weight. Follow these instructions at home: Medicines  Take over-the-counter and prescription medicines only as told by your health care provider.  Do not take aspirin to relieve pain if gout is suspected. Activity  Rest your joint if told by your health care provider. Rest is important when your disease is active and your joint feels painful, swollen, or stiff.  Avoid activities that make the pain worse. It is important to balance activity with rest.  Exercise your joint regularly with range-of-motion exercises as told by your health care provider. Try doing low-impact exercise, such as: ? Swimming. ? Water aerobics. ? Biking. ? Walking. Joint Care   If your joint is swollen, keep it elevated if told by your health care provider.  If your joint feels stiff in the morning, try taking a warm shower.  If directed, apply heat to the joint. If you have diabetes, do not apply heat without permission from your health care provider. ? Put a towel between the joint and the hot pack or heating pad. ? Leave the heat on the area for 20-30 minutes.  If directed, apply ice to the joint: ? Put ice in a plastic bag. ? Place  a towel between your skin and the bag. ? Leave the ice on for 20 minutes, 2-3 times per day.  Keep all follow-up visits as told by your health care provider. This is important. Contact a health care provider if:  The pain gets worse.  You have a fever. Get help right away if:  You develop severe joint pain, swelling, or redness.  Many joints become painful and swollen.  You develop severe back pain.  You develop severe weakness in your leg.  You cannot control your  bladder or bowels. This information is not intended to replace advice given to you by your health care provider. Make sure you discuss any questions you have with your health care provider. Document Released: 02/10/2004 Document Revised: 06/10/2015 Document Reviewed: 03/30/2014 Elsevier Interactive Patient Education  [employer].

## 2017-01-29 NOTE — Progress Notes (Signed)
Chief Complaint  Patient presents with  . right knee pain    x 3 weeks, knee stiff and pt walks sideways to ease the pain but its causing her back to hurt.  Pain level 6/10, upon standing and throbbing.    HPI  Hypertension- uncontrolled Pt reports that she has a hard time in the month of 02-20-22 Her husband and her mother and 2 sisters all died in Feb 20, 2022 She reports that she has been going through stress and grieving still  She states that her pressure is high but never this high. She denies headaches, chest pains, no shortness of breath, no vision changes. She took amlodipine 10mg  this morning   BP Readings from Last 3 Encounters:  01/29/17 (!) 200/90  11/21/16 (!) 179/102  01/12/16 (!) 148/98    Chronic Renal Insufficiency She reports that she has been taking ibuprofen for her knee and has been taking 4 a day She just started taking the ibuprofen 5 days ago She denies taking anything else that would impact her kidney function She takes her amlodipine daily She was not using ibuprofen long term  Lab Results  Component Value Date   CREATININE 1.24 (H) 11/21/2016    Right knee pain  She has been having right knee pain for the past 3 weeks The pain is 8-9/10 She states that she has been taking 400mg  of ibuprofen bid for the past 5 days She states that the knee is swollen when compared to the left knee She has been walking side ways Due to the difficulty ambulating on her right knee she has to walk side ways She has a history of injury in 9th grade No new injury 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 Abdominal distention   . Abdominal pain   . Arthritis   . Blood transfusion without reported diagnosis   . Hernia   . Hyperlipidemia   . Hypertension   . Leg swelling   . Strangulated recurrent ventral incisional hernia, s/p small intestine resection 09/22/2010    Current Outpatient Medications  Medication Sig  Dispense Refill  . amLODipine (NORVASC) 10 MG tablet Take 1 tablet (10 mg total) daily by mouth. 90 tablet 0  . pantoprazole (PROTONIX) 40 MG tablet TAKE 1 TABLET(40 MG) BY MOUTH DAILY 30 tablet 0  . lisinopril (PRINIVIL,ZESTRIL) 5 MG tablet Take 1 tablet (5 mg total) by mouth daily. 90 tablet 0  . meloxicam (MOBIC) 7.5 MG tablet Take 1 tablet (7.5 mg total) by mouth daily. 30 tablet 0   No current facility-administered medications for this visit.     Allergies: No Known Allergies  Past Surgical History:  Procedure Laterality Date  . ABDOMINAL HYSTERECTOMY    . bowel obstruction  09/12/2010  . HERNIA REPAIR  2007   incarcerated, open with biologic mesh  . HERNIA REPAIR  Aug2012   strangulated, redo open with biologic mesh  . SMALL INTESTINE SURGERY  09/12/10   SBR in incarcerated [place]    Social History   Socioeconomic History  . Marital status: Widowed    Spouse name: None  . Number of children: None  . Years of education: None  . Highest education level: None  Social Needs  . Financial resource strain: None  . Food insecurity - worry: None  . Food insecurity - inability: None  . Transportation needs - medical: None  . Transportation needs - non-medical: None  Occupational History  .  None  Tobacco Use  . Smoking status: Never Smoker  . Smokeless tobacco: Never Used  Substance and Sexual Activity  . Alcohol use: No  . Drug use: No  . Sexual activity: No    Birth control/protection: Abstinence  Other Topics Concern  . None  Social History Narrative  . None    Family History  Problem Relation Age of Onset  . Heart disease Mother        heart attack   . Heart disease Father        heart attack   . Hypertension Sister      ROS Review of Systems See HPI Constitution: No fevers or chills No malaise No diaphoresis Skin: No rash or itching Eyes: no blurry vision, no double vision GU: no dysuria or hematuria Neuro: no dizziness or headaches * all others  reviewed and negative   Objective: Vitals:   01/29/17 1433 01/29/17 1458  BP: (!) 229/100 (!) 200/90  Pulse: 82   Resp: 16   Temp: 98.3 F (36.8 C)   TempSrc: Oral   SpO2: 99%   Weight: 237 lb 12.8 oz (107.9 kg)   Height: 5\' 1"  (1.549 m)     Physical Exam  Constitutional: She is oriented to person, place, and time. She appears well-developed and well-nourished.  HENT:  Head: Normocephalic and atraumatic.  Eyes: Conjunctivae and EOM are normal.  Cardiovascular: Normal rate, regular rhythm and normal heart sounds.  No murmur heard. Pulmonary/Chest: Effort normal and breath sounds normal. No stridor. No respiratory distress. She has no wheezes. She has no rales.  Musculoskeletal: She exhibits edema and tenderness.  Neurological: She is alert and oriented to person, place, and time.  Skin: Skin is warm. Capillary refill takes less than 2 seconds.  Psychiatric: She has a normal mood and affect. Her behavior is normal. Judgment and thought content normal.     Right knee twice size of the left No pocket of effusion Due to patient's morbid obesity there is some limitations to the exam She has crepitus and has noted to have patellar tenderness   RIGHT KNEE - COMPLETE 4+ VIEW  COMPARISON:  None.  FINDINGS: Advanced arthropathy of the knee joint with complete loss of joint space at all 3 compartments with osteophyte formation and chronic remodeling. Lateral subluxation of the tibia relative to the femur. Joint effusion. Suprapatellar osteophytes and/or loose bodies.  IMPRESSION: Advanced arthropathy of the knee joint with lateral subluxation and joint effusion. See above.   Electronically Signed   By: Nelson Chimes M.D.   On: 01/29/2017 15:32   Assessment and Plan Milany was seen today for right knee pain.  Diagnoses and all orders for this visit:  Acute pain of right knee Swelling of joint, knee, right Follow up with Orthopedics -     DG Knee Complete 4  Views Right; Future -     Ambulatory referral to Orthopedic Surgery  Uncontrolled hypertension- gave lisinopril for bp control Will recheck cr in 6 weeks -     lisinopril (PRINIVIL,ZESTRIL) 5 MG tablet; Take 1 tablet (5 mg total) by mouth daily.  Chronic renal impairment, stage 2 (mild)- discussed that short term of nsaids for now but would benefit from intraartricular injection Pt to see orthopedics and PT  Other orders -     meloxicam (MOBIC) 7.5 MG tablet; Take 1 tablet (7.5 mg total) by mouth daily.     [place]

## 2017-02-08 ENCOUNTER — Ambulatory Visit (INDEPENDENT_AMBULATORY_CARE_PROVIDER_SITE_OTHER): Payer: BC Managed Care – PPO | Admitting: Orthopaedic Surgery

## 2017-02-08 ENCOUNTER — Encounter (INDEPENDENT_AMBULATORY_CARE_PROVIDER_SITE_OTHER): Payer: Self-pay | Admitting: Orthopaedic Surgery

## 2017-02-08 VITALS — BP 153/85 | HR 81 | Resp 18 | Ht 61.0 in | Wt 237.0 lb

## 2017-02-08 DIAGNOSIS — G8929 Other chronic pain: Secondary | ICD-10-CM

## 2017-02-08 DIAGNOSIS — M25561 Pain in right knee: Secondary | ICD-10-CM

## 2017-02-08 MED ORDER — IBUPROFEN-FAMOTIDINE 800-26.6 MG PO TABS: ORAL_TABLET | ORAL | 0 refills | Status: DC

## 2017-02-08 NOTE — Progress Notes (Signed)
Office Visit Note   Patient: [name]           Date of Birth: 28-Mar-1955           MRN: [id number] Visit Date: 02/08/2017              Requested by: Mancel Bale, PA-C [address] [place], [place] [address] PCP: Mancel Bale, PA-C   Assessment & Plan: Visit Diagnoses:  1. Chronic pain of right knee     Plan: End-stage osteoarthritis right knee. Morbid obesity. On discussion regarding the arthritis different treatment options. Presently Mrs. Tavella is feeling better. Also discussed her BMI and what it would take to get below 40. No specific treatment today other than discussed different medicines and, exercise weight loss we'll see back any time in the future. Have discussed injections, medicines, exercises weight loss, etc. Total time spent was over 45 minutes.  Follow-Up Instructions: Return if symptoms worsen or fail to improve.   Orders:  No orders of the defined types were placed in this encounter.  No orders of the defined types were placed in this encounter.     Procedures: No procedures performed   Clinical Data: No additional findings.   Subjective: Chief Complaint  Patient presents with  . Right Knee - Pain  . Knee Pain    Right knee pain x  1 month, no injury, no surgery to right knee, not diabetic, swelling, difficulty walking, difficulty sleeping, Aleve doesn't help  No history of injury or trauma. I did review her films on the PACS system that she essentially has end-stage osteoarthritis. Has about 9 of varus with bone-on-bone in all 3 compartments. She is overweight noting that her family Has always been "big". At one point she weighed over 260 pounds and presently weighs 237 with a BMI of about 44-45. She has had a problem with stiffness and soreness particularly medial compartment of her knee. Presently she is relatively comfortable.  HPI  Review of Systems  Constitutional: Positive for activity change.  HENT: Negative for trouble  swallowing.   Eyes: Negative for pain.  Respiratory: Negative for shortness of breath.   Cardiovascular: Negative for leg swelling.  Gastrointestinal: Negative for constipation.  Endocrine: Negative for cold intolerance.  Genitourinary: Negative for difficulty urinating.  Musculoskeletal: Positive for joint swelling.  Skin: Negative for rash.  Allergic/Immunologic: Negative for food allergies.  Neurological: Negative for weakness.  Hematological: Does not bruise/bleed easily.  Psychiatric/Behavioral: Positive for sleep disturbance.     Objective: Vital Signs: BP (!) 153/85 (BP Location: Left Arm, Patient Position: Sitting, Cuff Size: Normal)   Pulse 81   Resp 18   Ht 5\' 1"  (1.549 m)   Wt 237 lb (107.5 kg)   BMI 44.78 kg/m   Physical Exam  Ortho Exam awake alert and oriented 3. Comfortable sitting. Very large legs. Large abdominal panniculus. Venous stasis changes along the distal third of her leg with some pitting edema. I did not feel good pulses but her foot was warm. Motor exam appears to be intact. Full extension about 90 of flexion right knee. Little bit opening medially with a valgus stress. Increased varus with weightbearing. Some patellar crepitation. No popliteal mass. No calf pain. Very minimal lateral joint pain. Straight leg raise negative. Painless range of motion of hip.  Specialty Comments:  No specialty comments available.  Imaging: No results found.   PMFS History: Patient Active Problem List   Diagnosis Date Noted  . Essential hypertension 05/06/2014  .  Hyperlipidemia 05/06/2014  . Strangulated recurrent ventral incisional hernia, s/p small intestine resection 09/22/2010  . Obesity, morbid, BMI >40 09/22/2010  . Edema of lower extremities 09/22/2010   Past Medical History:  Diagnosis Date  . Abdominal distention   . Abdominal pain   . Arthritis   . Blood transfusion without reported diagnosis   . Hernia   . Hyperlipidemia   . Hypertension   .  Leg swelling   . Strangulated recurrent ventral incisional hernia, s/p small intestine resection 09/22/2010    Family History  Problem Relation Age of Onset  . Heart disease Mother        heart attack   . Heart disease Father        heart attack   . Hypertension Sister     Past Surgical History:  Procedure Laterality Date  . ABDOMINAL HYSTERECTOMY    . bowel obstruction  09/12/2010  . HERNIA REPAIR  2007   incarcerated, open with biologic mesh  . HERNIA REPAIR  Aug2012   strangulated, redo open with biologic mesh  . SMALL INTESTINE SURGERY  09/12/10   SBR in incarcerated [place]   Social History   Occupational History  . Not on file  Tobacco Use  . Smoking status: Never Smoker  . Smokeless tobacco: Never Used  Substance and Sexual Activity  . Alcohol use: No  . Drug use: No  . Sexual activity: No    Birth control/protection: Abstinence

## 2017-02-08 NOTE — Addendum Note (Signed)
Addended by: Shona Needles on: 02/08/2017 03:34 PM   Modules accepted: Orders

## 2017-02-09 ENCOUNTER — Ambulatory Visit (INDEPENDENT_AMBULATORY_CARE_PROVIDER_SITE_OTHER): Payer: Self-pay | Admitting: Orthopaedic Surgery

## 2017-02-13 ENCOUNTER — Telehealth: Payer: Self-pay | Admitting: Family Medicine

## 2017-02-13 NOTE — Telephone Encounter (Signed)
Copied from [place] [id number]. Topic: Quick Communication - Rx Refill/Question >> Feb 13, 2017  2:53 PM [name] wrote: Medication:  lisinopril (PRINIVIL,ZESTRIL) 5 MG tablet [[id number]]   Pt called and stated that the Rx does not make her feel well and would like to discuss other options of treatment for bp, contact pt to advise

## 2017-02-13 NOTE — Telephone Encounter (Signed)
Pt has been on this medication for over a year.  An OV might be a good idea to discuss her symptoms and what the cause might be.

## 2017-02-13 NOTE — Telephone Encounter (Signed)
Making her lips feel dry and tingly and dizzy about an hour after she takes. She stated she knows it states on the bottle that it may cause dizziness but as far as the other sxs she has never felt that way after taking a medication please advise.

## 2017-02-16 NOTE — Telephone Encounter (Signed)
Called pt to see if she wanted to come iin for an earlier an appt due to the side effects that she is having. She has an appt with Dr. Nolon Rod 2/28.  If she calls in, please try and make her an earlier appt with Dr. Nolon Rod.  Thanks!

## 2017-02-20 ENCOUNTER — Other Ambulatory Visit: Payer: Self-pay | Admitting: Urgent Care

## 2017-02-20 DIAGNOSIS — I10 Essential (primary) hypertension: Secondary | ICD-10-CM

## 2017-02-25 ENCOUNTER — Other Ambulatory Visit: Payer: Self-pay | Admitting: Family Medicine

## 2017-02-26 NOTE — Telephone Encounter (Signed)
Refill request for meloxicam 7.5 mg #30 with no refills approved per dr Nolon Rod. Dgaddy, CMA

## 2017-03-07 ENCOUNTER — Other Ambulatory Visit (INDEPENDENT_AMBULATORY_CARE_PROVIDER_SITE_OTHER): Payer: Self-pay | Admitting: Orthopaedic Surgery

## 2017-03-15 ENCOUNTER — Ambulatory Visit: Payer: Self-pay | Admitting: Family Medicine

## 2017-04-23 ENCOUNTER — Other Ambulatory Visit (INDEPENDENT_AMBULATORY_CARE_PROVIDER_SITE_OTHER): Payer: Self-pay | Admitting: Orthopaedic Surgery

## 2017-04-23 NOTE — Telephone Encounter (Signed)
Ok to refill 

## 2017-05-02 IMAGING — MG MM BREAST STEREO BIOPSY*R*
1 series · 1 of 1 positions shown · non-contrast
Comparison: Previous exams.

ADDENDUM:
Pathology reveals FIBROCYSTIC CHANGES WITH CALCIFICATION of the
Right central breast . This was found to be concordant by Dr. Schlesinger
Rusio. Pathology results were discussed with the patient via
telephone. She reported tenderness at the biopsy site and is doing
well otherwise. Post biopsy care and instructions were reviewed and
questions were answered. The patient was encouraged to call The
[REDACTED] with any additional questions
and or concerns. The patient was instructed to return for annual
screening mammograms. The patient has requested future mammograms be
scheduled at The [REDACTED]. The patient was informed a reminder
notice would be sent regarding this appointment.

Pathology results reported by Macgiver Charfuelan RN on November 06, 2014.
CLINICAL DATA: Patient is an asymptomatic 59-year-old female were
presents for stereotactic guided core biopsy of the right breast.
EXAM:
Choose 3 BREAST STEREOTACTIC CORE NEEDLE BIOPSY

[R SPECIMEN]
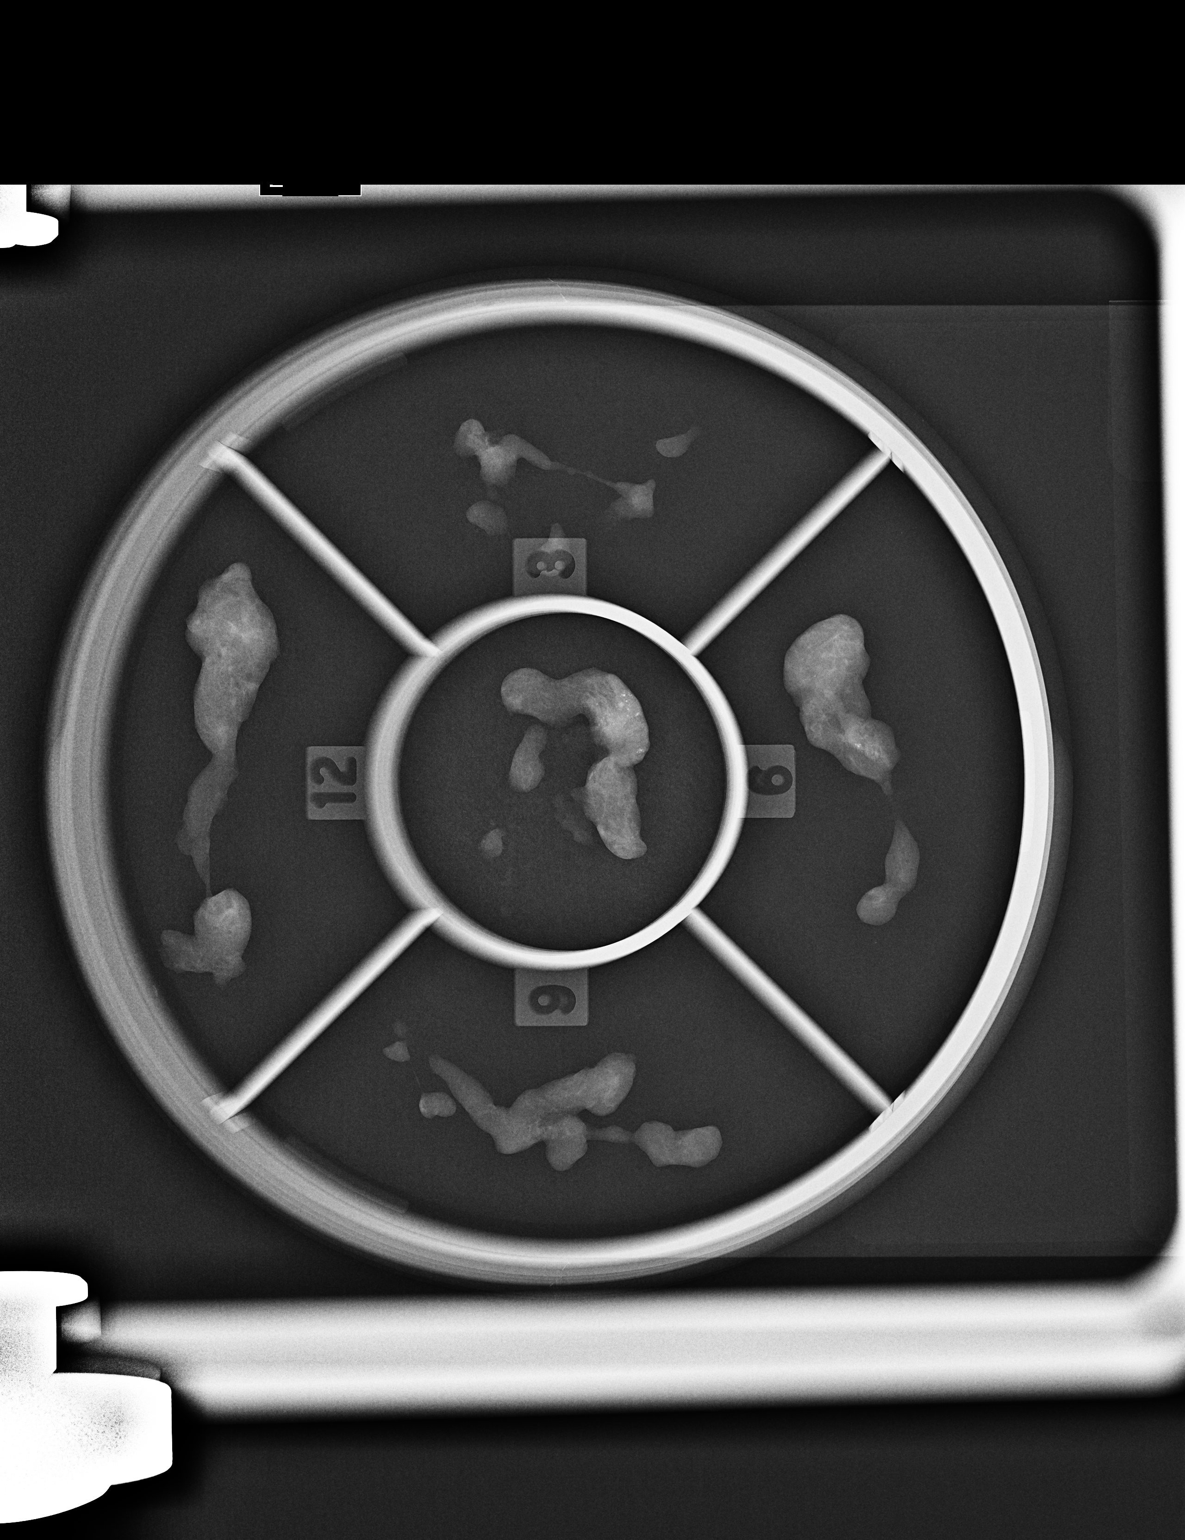

[1 of 1 positions shown; findings below may reference images not displayed]



Using sterile technique and 2% lidocaine with and without
epinephrine as local anesthetic, under stereotactic guidance, a 9
gauge vacuum assisted device was used to perform core needle biopsy
of calcifications and asymmetry in the central breast using a medial
to lateral approach. Specimen radiograph was performed showing the
targeted microcalcifications. Specimens with calcifications are
identified for pathology.

At the conclusion of the procedure, a X shaped tissue marker clip
was deployed into the biopsy cavity. Follow-up 2-view mammogram was
performed and dictated separately.
IMPRESSION: Stereotactic-guided biopsy of the right breast. No apparent
complications. Pathology is pending.

## 2017-05-06 ENCOUNTER — Other Ambulatory Visit: Payer: Self-pay | Admitting: Family Medicine

## 2017-05-06 DIAGNOSIS — I10 Essential (primary) hypertension: Secondary | ICD-10-CM

## 2017-05-07 NOTE — Telephone Encounter (Signed)
Lisinopril 5 mg tablet refill request  LOV 01/29/17 with [facility]   PCP is [facility]  Follow up appt 03/15/17 cancelled by pt  [place], [place] - [address]

## 2017-05-08 NOTE — Telephone Encounter (Signed)
Refill request for lisinopril 5 mg #30 with no refills approved.  Pt needs office visit to check creatinine, pt didn't come back in 6 weeks as advised at 01/29/17 office visit.  Will send to scheduling to call pt and schedule f/u htn ov and creatinine check.  No further refills without office visit. Dgaddy, CMA

## 2017-06-05 ENCOUNTER — Ambulatory Visit (INDEPENDENT_AMBULATORY_CARE_PROVIDER_SITE_OTHER): Payer: BC Managed Care – PPO | Admitting: Orthopaedic Surgery

## 2017-06-05 ENCOUNTER — Encounter (INDEPENDENT_AMBULATORY_CARE_PROVIDER_SITE_OTHER): Payer: Self-pay | Admitting: Orthopaedic Surgery

## 2017-06-05 DIAGNOSIS — M25512 Pain in left shoulder: Secondary | ICD-10-CM

## 2017-06-05 DIAGNOSIS — G8929 Other chronic pain: Secondary | ICD-10-CM | POA: Diagnosis not present

## 2017-06-05 NOTE — Progress Notes (Signed)
Patient is a pleasant 62 year old female presents to our clinic today with continued left shoulder pain.  This began in the fall 2018 and has progressively worsened.  She works as a Investment banker, operational at [employer].  We saw her back in early December 2018 where we proceeded with a subacromial cortisone injection.  This helped for approximately 2 to 3 days.  The pain returned and is gradually worsened.  She has increased pain with abduction and internal rotation.  She is unable to sleep on the left side.  At this point, I feel it is appropriate to proceed with an MRI of the left shoulder to assess her rotator cuff.  She will follow-up with [place] once that is completed.  Call with concerns or questions in the meantime.

## 2017-06-17 ENCOUNTER — Other Ambulatory Visit: Payer: Self-pay | Admitting: Family Medicine

## 2017-06-17 DIAGNOSIS — I10 Essential (primary) hypertension: Secondary | ICD-10-CM

## 2017-06-18 ENCOUNTER — Other Ambulatory Visit (INDEPENDENT_AMBULATORY_CARE_PROVIDER_SITE_OTHER): Payer: Self-pay

## 2017-06-18 ENCOUNTER — Telehealth (INDEPENDENT_AMBULATORY_CARE_PROVIDER_SITE_OTHER): Payer: Self-pay | Admitting: Orthopaedic Surgery

## 2017-06-18 MED ORDER — PREDNISONE 10 MG (21) PO TBPK: ORAL_TABLET | ORAL | 0 refills | Status: DC

## 2017-06-18 NOTE — Telephone Encounter (Signed)
Please advise 

## 2017-06-18 NOTE — Telephone Encounter (Signed)
CALLED RX INTO PHARM, PATIENT AWARE.

## 2017-06-18 NOTE — Telephone Encounter (Signed)
Refill request for lisinopril 5 mg denied.  Pt needs office visit, last ov on 01/29/17 and last refill on 05/08/17.  No refills without pt needs checkup. Dgaddy, CMA

## 2017-06-18 NOTE — Telephone Encounter (Signed)
Patient called saying that she was prescribed ibuprofen and it's just not helping eith the pain, could she be prescribed something else. Thank you. CB #  [phone]

## 2017-06-18 NOTE — Telephone Encounter (Signed)
Let's try a sterapred pak.  Thanks.

## 2017-06-21 ENCOUNTER — Other Ambulatory Visit: Payer: BC Managed Care – PPO

## 2017-06-21 ENCOUNTER — Telehealth (INDEPENDENT_AMBULATORY_CARE_PROVIDER_SITE_OTHER): Payer: Self-pay | Admitting: Orthopaedic Surgery

## 2017-06-21 ENCOUNTER — Ambulatory Visit
Admission: RE | Admit: 2017-06-21 | Discharge: 2017-06-21 | Disposition: A | Discharge status: Home / Self Care | Payer: BC Managed Care – PPO | Source: Ambulatory Visit | Attending: Orthopaedic Surgery | Admitting: Orthopaedic Surgery

## 2017-06-21 ENCOUNTER — Other Ambulatory Visit (INDEPENDENT_AMBULATORY_CARE_PROVIDER_SITE_OTHER): Payer: Self-pay

## 2017-06-21 DIAGNOSIS — G8929 Other chronic pain: Secondary | ICD-10-CM

## 2017-06-21 DIAGNOSIS — M25512 Pain in left shoulder: Principal | ICD-10-CM

## 2017-06-21 MED ORDER — DIAZEPAM 5 MG PO TABS: ORAL_TABLET | ORAL | 0 refills | Status: DC

## 2017-06-21 NOTE — Telephone Encounter (Signed)
CALLED RX INTO PHARM

## 2017-06-21 NOTE — Telephone Encounter (Signed)
Sure 5 mg

## 2017-06-21 NOTE — Telephone Encounter (Signed)
Patient called advised she could not complete her MRI today. Patient asked if she can get some valium before she reschedule her MRI. The number to contact patient is [phone]

## 2017-06-21 NOTE — Telephone Encounter (Signed)
Please advise 

## 2017-06-27 ENCOUNTER — Ambulatory Visit
Admission: RE | Admit: 2017-06-27 | Discharge: 2017-06-27 | Disposition: A | Discharge status: Home / Self Care | Payer: BC Managed Care – PPO | Source: Ambulatory Visit | Attending: Orthopaedic Surgery | Admitting: Orthopaedic Surgery

## 2017-06-28 NOTE — Progress Notes (Signed)
Needs f/u appt asap to discuss MRI

## 2017-07-03 ENCOUNTER — Ambulatory Visit (INDEPENDENT_AMBULATORY_CARE_PROVIDER_SITE_OTHER): Payer: BC Managed Care – PPO | Admitting: Orthopaedic Surgery

## 2017-07-03 ENCOUNTER — Encounter (INDEPENDENT_AMBULATORY_CARE_PROVIDER_SITE_OTHER): Payer: Self-pay | Admitting: Orthopaedic Surgery

## 2017-07-03 DIAGNOSIS — Z6841 Body Mass Index (BMI) 40.0 and over, adult: Secondary | ICD-10-CM | POA: Insufficient documentation

## 2017-07-03 DIAGNOSIS — M75122 Complete rotator cuff tear or rupture of left shoulder, not specified as traumatic: Secondary | ICD-10-CM

## 2017-07-03 NOTE — Progress Notes (Signed)
Office Visit Note   Patient: [name]           Date of Birth: 1955/12/29           MRN: [id number] Visit Date: 07/03/2017              Requested by: Mancel Bale, PA-C [address] [place],  [address] PCP: Mancel Bale, PA-C   Assessment & Plan: Visit Diagnoses:  1. Morbid obesity ([name])   2. Body mass index 40.0-44.9, adult ([name])   3. Nontraumatic complete tear of left rotator cuff     Plan: Impression is left shoulder retracted rotator cuff tendon tear supraspinatus.  At this point, surgical intervention is indicated.  This will include a left shoulder arthroscopic debridement, acromioplasty, distal clavicle excision and rotator cuff repair.  Risks, benefits and possible comp occasions reviewed.  Rehab and recovery time discussed.  All questions answered.  This will need to be done sooner than later.   Follow-Up Instructions: Return for 2 weeks post-op.   Orders:  No orders of the defined types were placed in this encounter.  No orders of the defined types were placed in this encounter.     Procedures: No procedures performed   Clinical Data: No additional findings.   Subjective: Chief Complaint  Patient presents with  . Left Shoulder - Pain, Follow-up    HPI patient is a pleasant 62 year old right-hand-dominant female who presents to our clinic today to discuss MRI results of her left shoulder.  She works in [employer] and first noticed pain to the left shoulder back in the fall 2018.  There is never a specific injury.  MRI results from 06/28/2017 show a full-thickness retracted supraspinatus tendon tear which is retracted approximately 2.6 cm.  Mild fatty atrophy noted.  She also has moderate AC changes.  She has failed conservative treatment options to include cortisone injection and physical therapy exercises.  Review of Systems as detailed in HPI.  All others reviewed and are negative.   Objective: Vital Signs: There were no  vitals taken for this visit.  Physical Exam well-developed well-nourished female no acute distress.  Alert and oriented x3.  Ortho Exam stable exam of the shoulder  Specialty Comments:  No specialty comments available.  Imaging: No new x-rays today   PMFS History: Patient Active Problem List   Diagnosis Date Noted  . Body mass index 40.0-44.9, adult ([name]) 07/03/2017  . Nontraumatic complete tear of left rotator cuff 07/03/2017  . Chronic left shoulder pain 06/05/2017  . Essential hypertension 05/06/2014  . Hyperlipidemia 05/06/2014  . Strangulated recurrent ventral incisional hernia, s/p small intestine resection 09/22/2010  . Morbid obesity ([name]) 09/22/2010  . Edema of lower extremities 09/22/2010   Past Medical History:  Diagnosis Date  . Abdominal distention   . Abdominal pain   . Arthritis   . Blood transfusion without reported diagnosis   . Hernia   . Hyperlipidemia   . Hypertension   . Leg swelling   . Strangulated recurrent ventral incisional hernia, s/p small intestine resection 09/22/2010    Family History  Problem Relation Age of Onset  . Heart disease Mother        heart attack   . Heart disease Father        heart attack   . Hypertension Sister     Past Surgical History:  Procedure Laterality Date  . ABDOMINAL HYSTERECTOMY    . bowel obstruction  09/12/2010  . HERNIA  REPAIR  2007   incarcerated, open with biologic mesh  . HERNIA REPAIR  Aug2012   strangulated, redo open with biologic mesh  . SMALL INTESTINE SURGERY  09/12/10   SBR in incarcerated [place]   Social History   Occupational History  . Not on file  Tobacco Use  . Smoking status: Never Smoker  . Smokeless tobacco: Never Used  Substance and Sexual Activity  . Alcohol use: No  . Drug use: No  . Sexual activity: Never    Birth control/protection: Abstinence

## 2017-07-04 ENCOUNTER — Ambulatory Visit (INDEPENDENT_AMBULATORY_CARE_PROVIDER_SITE_OTHER): Payer: Self-pay | Admitting: Orthopaedic Surgery

## 2017-07-11 ENCOUNTER — Ambulatory Visit: Payer: Self-pay | Admitting: Physician Assistant

## 2017-07-11 ENCOUNTER — Other Ambulatory Visit: Payer: Self-pay

## 2017-07-11 ENCOUNTER — Encounter: Payer: Self-pay | Admitting: Physician Assistant

## 2017-07-11 ENCOUNTER — Ambulatory Visit: Payer: BC Managed Care – PPO | Admitting: Physician Assistant

## 2017-07-11 VITALS — BP 148/96 | HR 108 | Temp 99.0°F | Resp 18 | Ht 61.0 in | Wt 217.4 lb

## 2017-07-11 DIAGNOSIS — N182 Chronic kidney disease, stage 2 (mild): Secondary | ICD-10-CM | POA: Diagnosis not present

## 2017-07-11 DIAGNOSIS — I10 Essential (primary) hypertension: Secondary | ICD-10-CM | POA: Diagnosis not present

## 2017-07-11 DIAGNOSIS — M75122 Complete rotator cuff tear or rupture of left shoulder, not specified as traumatic: Secondary | ICD-10-CM | POA: Diagnosis not present

## 2017-07-11 DIAGNOSIS — Z01818 Encounter for other preprocedural examination: Secondary | ICD-10-CM | POA: Diagnosis not present

## 2017-07-11 LAB — POCT URINALYSIS DIP (MANUAL ENTRY)
BILIRUBIN UA: NEGATIVE
GLUCOSE UA: NEGATIVE mg/dL
Ketones, POC UA: NEGATIVE mg/dL
Leukocytes, UA: NEGATIVE
Nitrite, UA: NEGATIVE
Protein Ur, POC: NEGATIVE mg/dL
SPEC GRAV UA: 1.02 (ref 1.010–1.025)
Urobilinogen, UA: 0.2 E.U./dL
pH, UA: 5.5 (ref 5.0–8.0)

## 2017-07-11 LAB — CMP14+EGFR
ALBUMIN: 3.5 g/dL — AB (ref 3.6–4.8)
ALK PHOS: 70 IU/L (ref 39–117)
ALT: 7 IU/L (ref 0–32)
AST: 8 IU/L (ref 0–40)
Albumin/Globulin Ratio: 1.3 (ref 1.2–2.2)
BUN/Creatinine Ratio: 14 (ref 12–28)
BUN: 14 mg/dL (ref 8–27)
Bilirubin Total: <0.2 mg/dL (ref 0.0–1.2)
CO2: 20 mmol/L (ref 20–29)
CREATININE: 1.01 mg/dL — AB (ref 0.57–1.00)
Calcium: 8.8 mg/dL (ref 8.7–10.3)
Chloride: 112 mmol/L — ABNORMAL HIGH (ref 96–106)
GFR calc Af Amer: 68 mL/min/{1.73_m2} (ref >59)
GFR calc non Af Amer: 59 mL/min/{1.73_m2} — ABNORMAL LOW (ref >59)
GLOBULIN, TOTAL: 2.6 g/dL (ref 1.5–4.5)
Glucose: 90 mg/dL (ref 65–99)
Potassium: 4.3 mmol/L (ref 3.5–5.2)
SODIUM: 146 mmol/L — AB (ref 134–144)
Total Protein: 6.1 g/dL (ref 6.0–8.5)

## 2017-07-11 MED ORDER — AMLODIPINE BESYLATE 10 MG PO TABS: ORAL_TABLET | ORAL | 0 refills | Status: DC

## 2017-07-11 MED ORDER — LISINOPRIL 5 MG PO TABS
ORAL_TABLET | ORAL | 0 refills | Status: DC
Start: 2017-07-11 — End: 2017-08-13

## 2017-07-11 NOTE — Patient Instructions (Addendum)
Norvasc 10mg  - take this tonight - and continue to take daily.    IF you received an x-ray today, you will receive an invoice from [facility]. Please contact [facility] at [phone] with questions or concerns regarding your invoice.   IF you received labwork today, you will receive an invoice from [place]. Please contact [facility] at [phone] with questions or concerns regarding your invoice.   Our billing staff will not be able to assist you with questions regarding bills from these companies.  You will be contacted with the lab results as soon as they are available. The fastest way to get your results is to activate your My Chart account. Instructions are located on the last page of this paperwork. If you have not heard from [place] regarding the results in 2 weeks, please contact this office.

## 2017-07-11 NOTE — Progress Notes (Signed)
[name]  MRN: [id number] DOB: June 22, 1955  PCP: Mancel Bale, PA-C  Chief Complaint  Patient presents with  . Medical Clearance    Subjective:  Pt presents to clinic for medical clearance for rotator cuff surgery left arm.  She has known HTN and on Lisinopril 5 mg daily. BP this am 157/86.  She has no chest pain and she does have SOB going up stairs but it is not changing or getting worse.  She is supposed to be on norvasc but she has run out and she was not sure if she needed more of this medication.  Tomorrow 10am arrival - [facility]  History is obtained by patient.  Review of Systems  Patient Active Problem List   Diagnosis Date Noted  . Body mass index 40.0-44.9, adult ([place]) 07/03/2017  . Nontraumatic complete tear of left rotator cuff 07/03/2017  . Chronic left shoulder pain 06/05/2017  . Essential hypertension 05/06/2014  . Hyperlipidemia 05/06/2014  . Strangulated recurrent ventral incisional hernia, s/p small intestine resection 09/22/2010  . Morbid obesity ([place]) 09/22/2010  . Edema of lower extremities 09/22/2010    Current Outpatient Medications on File Prior to Visit  Medication Sig Dispense Refill  . diazepam (VALIUM) 5 MG tablet TAKE 1 TAB PO 30 MINS BEFORE PROCEDURE. 2 tablet 0  . ibuprofen (ADVIL,MOTRIN) 800 MG tablet TAKE 1 TABLET(800 MG) BY MOUTH THREE TIMES DAILY 30 tablet 0   No current facility-administered medications on file prior to visit.     No Known Allergies  Past Medical History:  Diagnosis Date  . Abdominal distention   . Abdominal pain   . Arthritis   . Blood transfusion without reported diagnosis   . Hernia   . Hyperlipidemia   . Hypertension   . Leg swelling   . Strangulated recurrent ventral incisional hernia, s/p small intestine resection 09/22/2010   Social History   Social History Narrative  . Not on file   Social History   Tobacco Use  . Smoking status: Never Smoker  . Smokeless tobacco: Never  Used  Substance Use Topics  . Alcohol use: No  . Drug use: No   family history includes Heart disease in her father and mother; Hypertension in her sister.     Objective:  BP (!) 148/96   Pulse (!) 108   Temp 99 F (37.2 C) (Oral)   Resp 18   Ht '5\' 1"'  (1.549 m)   Wt 217 lb 6.4 oz (98.6 kg)   SpO2 99%   BMI 41.08 kg/m  Body mass index is 41.08 kg/m.  Wt Readings from Last 3 Encounters:  07/11/17 217 lb 6.4 oz (98.6 kg)  02/08/17 237 lb (107.5 kg)  01/29/17 237 lb 12.8 oz (107.9 kg)    Physical Exam  Constitutional: She is oriented to person, place, and time. She appears well-developed and well-nourished.  HENT:  Head: Normocephalic and atraumatic.  Right Ear: Hearing and external ear normal.  Left Ear: Hearing and external ear normal.  Eyes: Conjunctivae are normal.  Neck: Normal range of motion.  Cardiovascular: Normal rate, regular rhythm and normal heart sounds.  No murmur heard. Pulmonary/Chest: Effort normal and breath sounds normal. She has no wheezes.  Neurological: She is alert and oriented to person, place, and time.  Skin: Skin is warm and dry.  Psychiatric: Judgment normal.  Vitals reviewed.   Results for orders placed or performed in visit on 07/11/17  CMP14+EGFR  Result Value Ref Range  Glucose 90 65 - 99 mg/dL   BUN 14 8 - 27 mg/dL   Creatinine, Ser 1.01 (H) 0.57 - 1.00 mg/dL   GFR calc non Af Amer 59 (L) >59 mL/min/1.73   GFR calc Af Amer 68 >59 mL/min/1.73   BUN/Creatinine Ratio 14 12 - 28   Sodium 146 (H) 134 - 144 mmol/L   Potassium 4.3 3.5 - 5.2 mmol/L   Chloride 112 (H) 96 - 106 mmol/L   CO2 20 20 - 29 mmol/L   Calcium 8.8 8.7 - 10.3 mg/dL   Total Protein 6.1 6.0 - 8.5 g/dL   Albumin 3.5 (L) 3.6 - 4.8 g/dL   Globulin, Total 2.6 1.5 - 4.5 g/dL   Albumin/Globulin Ratio 1.3 1.2 - 2.2   Bilirubin Total <0.2 0.0 - 1.2 mg/dL   Alkaline Phosphatase 70 39 - 117 IU/L   AST 8 0 - 40 IU/L   ALT 7 0 - 32 IU/L  POCT urinalysis dipstick    Result Value Ref Range   Color, UA yellow yellow   Clarity, UA cloudy (A) clear   Glucose, UA negative negative mg/dL   Bilirubin, UA negative negative   Ketones, POC UA negative negative mg/dL   Spec Grav, UA 1.020 1.010 - 1.025   Blood, UA trace-intact (A) negative   pH, UA 5.5 5.0 - 8.0   Protein Ur, POC negative negative mg/dL   Urobilinogen, UA 0.2 0.2 or 1.0 E.U./dL   Nitrite, UA Negative Negative   Leukocytes, UA Negative Negative   Rhythm: sinus rhythm at a rate of 81. Findings: NSR Last EKG: 2016  Changes from last EKG: Yes I have personally reviewed the EKG tracing and agree with the computerized printout.  Assessment and Plan :  Uncontrolled hypertension - Plan: EKG 12-Lead, CMP14+EGFR, POCT urinalysis dipstick, lisinopril (PRINIVIL,ZESTRIL) 5 MG tablet  Chronic renal impairment, stage 2 (mild) - Plan: CMP14+EGFR, POCT urinalysis dipstick  Nontraumatic complete tear of left rotator cuff  Pre-op evaluation - pt does not appear to be at increase cardiovascular risk for her upcoming surgery tomorrow.  Essential hypertension - Plan: amLODipine (NORVASC) 10 MG tablet   Dr Erlinda Hong [phone]  Patient verbalized to me that they understand the following: diagnosis, what is being done for them, what to expect and what should be done at home.  Their questions have been answered.  See after visit summary for patient specific instructions.  Windell Hummingbird PA-C  [facility] 07/11/2017 9:52 PM  Please note: Portions of this report may have been transcribed using dragon voice recognition software. Every effort was made to ensure accuracy; however, inadvertent computerized transcription errors may be present.  6/26 at 9:30pm- called and spoke with Dr Erlinda Hong regarding I think the patient is at no additional cardiac risk for her surgery tomorrow.  I called and LMOM for patient that she should plan on surgery tomorrow.

## 2017-07-12 DIAGNOSIS — M75122 Complete rotator cuff tear or rupture of left shoulder, not specified as traumatic: Secondary | ICD-10-CM | POA: Diagnosis not present

## 2017-07-12 DIAGNOSIS — S43432A Superior glenoid labrum lesion of left shoulder, initial encounter: Secondary | ICD-10-CM | POA: Diagnosis not present

## 2017-07-12 DIAGNOSIS — M19012 Primary osteoarthritis, left shoulder: Secondary | ICD-10-CM | POA: Diagnosis not present

## 2017-07-12 DIAGNOSIS — M7542 Impingement syndrome of left shoulder: Secondary | ICD-10-CM | POA: Diagnosis not present

## 2017-07-13 ENCOUNTER — Telehealth (INDEPENDENT_AMBULATORY_CARE_PROVIDER_SITE_OTHER): Payer: Self-pay | Admitting: Orthopaedic Surgery

## 2017-07-13 NOTE — Telephone Encounter (Signed)
Please advise 

## 2017-07-13 NOTE — Telephone Encounter (Signed)
Patient called requesting an RX be sent in for her to make her relax so that she can go to sleep.  She stated that she has been up since she had surgery with Dr. Erlinda Hong on Thursday afternoon.  CB#[phone].  Thank you.

## 2017-07-15 ENCOUNTER — Other Ambulatory Visit (INDEPENDENT_AMBULATORY_CARE_PROVIDER_SITE_OTHER): Payer: Self-pay | Admitting: Orthopaedic Surgery

## 2017-07-15 MED ORDER — ZOLPIDEM TARTRATE 5 MG PO TABS: 5.0000 mg | ORAL_TABLET | Freq: Every evening | ORAL | 0 refills | Status: DC | PRN

## 2017-07-15 NOTE — Telephone Encounter (Signed)
I sent in [place]

## 2017-07-16 ENCOUNTER — Other Ambulatory Visit (INDEPENDENT_AMBULATORY_CARE_PROVIDER_SITE_OTHER): Payer: Self-pay

## 2017-07-16 MED ORDER — ZOLPIDEM TARTRATE 5 MG PO TABS: 5.0000 mg | ORAL_TABLET | Freq: Every evening | ORAL | 0 refills | Status: DC | PRN

## 2017-07-16 NOTE — Telephone Encounter (Signed)
Rx printed out at the office. Called it into pharm

## 2017-07-24 ENCOUNTER — Ambulatory Visit (INDEPENDENT_AMBULATORY_CARE_PROVIDER_SITE_OTHER): Payer: BC Managed Care – PPO | Admitting: Orthopaedic Surgery

## 2017-07-24 ENCOUNTER — Encounter (INDEPENDENT_AMBULATORY_CARE_PROVIDER_SITE_OTHER): Payer: Self-pay | Admitting: Orthopaedic Surgery

## 2017-07-24 DIAGNOSIS — M7542 Impingement syndrome of left shoulder: Secondary | ICD-10-CM

## 2017-07-24 DIAGNOSIS — Z6841 Body Mass Index (BMI) 40.0 and over, adult: Secondary | ICD-10-CM

## 2017-07-24 DIAGNOSIS — M75122 Complete rotator cuff tear or rupture of left shoulder, not specified as traumatic: Secondary | ICD-10-CM

## 2017-07-24 MED ORDER — HYDROCODONE-ACETAMINOPHEN 5-325 MG PO TABS: 1.0000 | ORAL_TABLET | Freq: Two times a day (BID) | ORAL | 0 refills | Status: DC | PRN

## 2017-07-24 NOTE — Addendum Note (Signed)
Addended by: Marlyne Beards on: 07/24/2017 08:26 AM   Modules accepted: Orders

## 2017-07-24 NOTE — Progress Notes (Signed)
   Post-Op Visit Note   Patient: [name]           Date of Birth: 1955/10/06           MRN: [id number] Visit Date: 07/24/2017 PCP: Mancel Bale, PA-C   Assessment & Plan:  Chief Complaint:  Chief Complaint  Patient presents with  . Left Shoulder - Routine Post Op   Visit Diagnoses:  1. Nontraumatic complete tear of left rotator cuff   2. Impingement syndrome of left shoulder   3. Morbid obesity ([place])   4. Body mass index 40.0-44.9, adult ([place])     Plan: Patient is two-week status post left shoulder arthroscopy with rotator cuff repair and debridement and distal clavicle excision and subacromial pressure.  Patient is overall doing well.  Pain has significantly improved.  Her incisions have healed.  The sutures were removed today.  Referral to physical therapy at this point.  Work note was provided for no use of the left arm.  [place] was refilled today.  Follow-up in 4 weeks for recheck.  Follow-Up Instructions: Return in about 1 month (around 08/21/2017).   Orders:  No orders of the defined types were placed in this encounter.  Meds ordered this encounter  Medications  . HYDROcodone-acetaminophen (NORCO) 5-325 MG tablet    Sig: Take 1-2 tablets by mouth 2 (two) times daily as needed.    Dispense:  14 tablet    Refill:  0    Imaging: No results found.  PMFS History: Patient Active Problem List   Diagnosis Date Noted  . Body mass index 40.0-44.9, adult ([place]) 07/03/2017  . Nontraumatic complete tear of left rotator cuff 07/03/2017  . Chronic left shoulder pain 06/05/2017  . Essential hypertension 05/06/2014  . Hyperlipidemia 05/06/2014  . Strangulated recurrent ventral incisional hernia, s/p small intestine resection 09/22/2010  . Morbid obesity ([place]) 09/22/2010  . Edema of lower extremities 09/22/2010   Past Medical History:  Diagnosis Date  . Abdominal distention   . Abdominal pain   . Arthritis   . Blood transfusion without reported diagnosis   . Hernia    . Hyperlipidemia   . Hypertension   . Leg swelling   . Strangulated recurrent ventral incisional hernia, s/p small intestine resection 09/22/2010    Family History  Problem Relation Age of Onset  . Heart disease Mother        heart attack   . Heart disease Father        heart attack   . Hypertension Sister     Past Surgical History:  Procedure Laterality Date  . ABDOMINAL HYSTERECTOMY    . bowel obstruction  09/12/2010  . HERNIA REPAIR  2007   incarcerated, open with biologic mesh  . HERNIA REPAIR  Aug2012   strangulated, redo open with biologic mesh  . SMALL INTESTINE SURGERY  09/12/10   SBR in incarcerated [place]   Social History   Occupational History  . Not on file  Tobacco Use  . Smoking status: Never Smoker  . Smokeless tobacco: Never Used  Substance and Sexual Activity  . Alcohol use: No  . Drug use: No  . Sexual activity: Never    Birth control/protection: Abstinence

## 2017-07-25 ENCOUNTER — Ambulatory Visit (INDEPENDENT_AMBULATORY_CARE_PROVIDER_SITE_OTHER): Payer: Self-pay | Admitting: Orthopaedic Surgery

## 2017-07-26 ENCOUNTER — Inpatient Hospital Stay (INDEPENDENT_AMBULATORY_CARE_PROVIDER_SITE_OTHER): Payer: Self-pay | Admitting: Orthopaedic Surgery

## 2017-08-11 ENCOUNTER — Ambulatory Visit: Payer: Self-pay | Admitting: Physician Assistant

## 2017-08-13 ENCOUNTER — Ambulatory Visit: Payer: BC Managed Care – PPO | Admitting: Physician Assistant

## 2017-08-13 ENCOUNTER — Encounter: Payer: Self-pay | Admitting: Physician Assistant

## 2017-08-13 ENCOUNTER — Other Ambulatory Visit: Payer: Self-pay

## 2017-08-13 VITALS — BP 120/80 | HR 93 | Temp 98.7°F | Resp 18 | Ht 61.0 in | Wt 217.0 lb

## 2017-08-13 DIAGNOSIS — I10 Essential (primary) hypertension: Secondary | ICD-10-CM

## 2017-08-13 MED ORDER — OLMESARTAN MEDOXOMIL 5 MG PO TABS: 5.0000 mg | ORAL_TABLET | Freq: Every day | ORAL | 0 refills | Status: DC

## 2017-08-13 NOTE — Progress Notes (Signed)
[name]  MRN: [id number] DOB: 1956/01/13  PCP: Mancel Bale, PA-C  Chief Complaint  Patient presents with  . Hypertension    follow up  . Cough  . Medication Refill    lisinopril     Subjective:  Pt presents to clinic for recheck of her BP.  She had her shoulder surgery and the recovery is going well.  She is still in PT.  She was started on lisinopril about a month ago and since then she has had a dry cough.  She is not sick and does not feel any SOB or trouble with her breathing.  She has continue to take the medication.  Her BP at home is about 140s/80s.  History is obtained by patient.  Review of Systems  Constitutional: Negative for chills and fever.  Eyes: Negative for visual disturbance.  Respiratory: Negative for shortness of breath.   Cardiovascular: Negative for chest pain, palpitations and leg swelling.  Neurological: Negative for dizziness, light-headedness and headaches.    Patient Active Problem List   Diagnosis Date Noted  . Body mass index 40.0-44.9, adult ([place]) 07/03/2017  . Nontraumatic complete tear of left rotator cuff 07/03/2017  . Chronic left shoulder pain 06/05/2017  . Essential hypertension 05/06/2014  . Hyperlipidemia 05/06/2014  . Strangulated recurrent ventral incisional hernia, s/p small intestine resection 09/22/2010  . Morbid obesity ([place]) 09/22/2010  . Edema of lower extremities 09/22/2010    Current Outpatient Medications on File Prior to Visit  Medication Sig Dispense Refill  . amLODipine (NORVASC) 10 MG tablet TAKE 1 TABLET(10 MG) BY MOUTH DAILY 90 tablet 0  . HYDROcodone-acetaminophen (NORCO) 5-325 MG tablet Take 1-2 tablets by mouth 2 (two) times daily as needed. 14 tablet 0   No current facility-administered medications on file prior to visit.     Allergies  Allergen Reactions  . Lisinopril Cough    Past Medical History:  Diagnosis Date  . Abdominal distention   . Abdominal pain   . Arthritis   . Blood  transfusion without reported diagnosis   . Hernia   . Hyperlipidemia   . Hypertension   . Leg swelling   . Strangulated recurrent ventral incisional hernia, s/p small intestine resection 09/22/2010   Social History   Social History Narrative  . Not on file   Social History   Tobacco Use  . Smoking status: Never Smoker  . Smokeless tobacco: Never Used  Substance Use Topics  . Alcohol use: No  . Drug use: No   family history includes Heart disease in her father and mother; Hypertension in her sister.     Objective:  BP (!) 142/86   Pulse 93   Temp 98.7 F (37.1 C) (Oral)   Resp 18   Ht 5\' 1"  (1.549 m)   Wt 217 lb (98.4 kg)   SpO2 98%   BMI 41.00 kg/m  Body mass index is 41 kg/m.  Wt Readings from Last 3 Encounters:  08/13/17 217 lb (98.4 kg)  07/11/17 217 lb 6.4 oz (98.6 kg)  02/08/17 237 lb (107.5 kg)    Physical Exam  Constitutional: She is oriented to person, place, and time. She appears well-developed and well-nourished.  HENT:  Head: Normocephalic and atraumatic.  Right Ear: Hearing and external ear normal.  Left Ear: Hearing and external ear normal.  Eyes: Conjunctivae are normal.  Neck: Normal range of motion.  Cardiovascular: Normal rate, regular rhythm and normal heart sounds.  No murmur heard. Pulmonary/Chest: Effort normal  and breath sounds normal.  Musculoskeletal:       Right lower leg: She exhibits no edema.       Left lower leg: She exhibits no edema.  Neurological: She is alert and oriented to person, place, and time.  Skin: Skin is warm and dry.  Psychiatric: She has a normal mood and affect. Her behavior is normal. Judgment and thought content normal.  Vitals reviewed.   Assessment and Plan :  Essential hypertension - Plan: olmesartan (BENICAR) 5 MG tablet switch lisinopril to benicar to reduce cough - her BP is well controlled but want to check in a month to make sure cough has resolved and BP is still controlled with medication  change.  Patient verbalized to me that they understand the following: diagnosis, what is being done for them, what to expect and what should be done at home.  Their questions have been answered.  See after visit summary for patient specific instructions.  Windell Hummingbird PA-C  [facility] 08/13/2017 9:01 AM  Please note: Portions of this report may have been transcribed using dragon voice recognition software. Every effort was made to ensure accuracy; however, inadvertent computerized transcription errors may be present.

## 2017-08-13 NOTE — Patient Instructions (Addendum)
  Change your lisinopril to a different medication that should cause your cough to go away.   IF you received an x-ray today, you will receive an invoice from [facility]. Please contact [facility] at [phone] with questions or concerns regarding your invoice.   IF you received labwork today, you will receive an invoice from [place]. Please contact [facility] at [phone] with questions or concerns regarding your invoice.   Our billing staff will not be able to assist you with questions regarding bills from these companies.  You will be contacted with the lab results as soon as they are available. The fastest way to get your results is to activate your My Chart account. Instructions are located on the last page of this paperwork. If you have not heard from [place] regarding the results in 2 weeks, please contact this office.

## 2017-08-21 ENCOUNTER — Ambulatory Visit (INDEPENDENT_AMBULATORY_CARE_PROVIDER_SITE_OTHER): Payer: BC Managed Care – PPO | Admitting: Physician Assistant

## 2017-08-21 ENCOUNTER — Encounter (INDEPENDENT_AMBULATORY_CARE_PROVIDER_SITE_OTHER): Payer: Self-pay | Admitting: Physician Assistant

## 2017-08-21 DIAGNOSIS — Z6841 Body Mass Index (BMI) 40.0 and over, adult: Secondary | ICD-10-CM

## 2017-08-21 DIAGNOSIS — M75122 Complete rotator cuff tear or rupture of left shoulder, not specified as traumatic: Secondary | ICD-10-CM

## 2017-08-21 MED ORDER — HYDROCODONE-ACETAMINOPHEN 5-325 MG PO TABS: 1.0000 | ORAL_TABLET | Freq: Three times a day (TID) | ORAL | 0 refills | Status: DC | PRN

## 2017-08-21 NOTE — Progress Notes (Signed)
Post-Op Visit Note   Patient: [name]           Date of Birth: 05-30-1955           MRN: [id number] Visit Date: 08/21/2017 PCP: Mancel Bale, PA-C   Assessment & Plan: Patient is a pleasant 62 year old female who presents to our clinic today 6 weeks status post left shoulder arthroscopic decompression and rotator cuff repair.  She has been doing excellent.  Still admits to moderate pain but has really been working hard in formal physical therapy as well as a home exercise program.  Examination of the left shoulder reveals near full forward flexion.  She can internally rotate to her back pocket.  She still lacks a fair amount of strength.  At this point, she will continue with formal physical therapy as well as her home exercise program.  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  Instead of coming back in 6 weeks, she would like to come back in 4 weeks for recheck to see if at that point she will be able to return to work.  We did refill her Norco today.  Chief Complaint:  Chief Complaint  Patient presents with  . Left Shoulder - Pain, Follow-up   Visit Diagnoses:  1. Nontraumatic complete tear of left rotator cuff   2. Morbid obesity ([place])   3. Body mass index 40.0-44.9, adult [facility])       Follow-Up Instructions: Return in about 1 month (around 09/18/2017).   Orders:  No orders of the defined types were placed in this encounter.  Meds ordered this encounter  Medications  . HYDROcodone-acetaminophen (NORCO) 5-325 MG tablet    Sig: Take 1 tablet by mouth 3 (three) times daily as needed for moderate pain.    Dispense:  21 tablet    Refill:  0    Imaging: No new imaging  PMFS History: Patient Active Problem List   Diagnosis Date Noted  . Body mass index 40.0-44.9, adult ([place]) 07/03/2017  . Nontraumatic complete tear of left rotator cuff 07/03/2017  . Chronic left shoulder pain 06/05/2017  .  Essential hypertension 05/06/2014  . Hyperlipidemia 05/06/2014  . Strangulated recurrent ventral incisional hernia, s/p small intestine resection 09/22/2010  . Morbid obesity ([place]) 09/22/2010  . Edema of lower extremities 09/22/2010   Past Medical History:  Diagnosis Date  . Abdominal distention   . Abdominal pain   . Arthritis   . Blood transfusion without reported diagnosis   . Hernia   . Hyperlipidemia   . Hypertension   . Leg swelling   . Strangulated recurrent ventral incisional hernia, s/p small intestine resection 09/22/2010    Family History  Problem Relation Age of Onset  . Heart disease Mother        heart attack   . Heart disease Father        heart attack   . Hypertension Sister     Past Surgical History:  Procedure Laterality Date  . ABDOMINAL HYSTERECTOMY    . bowel obstruction  09/12/2010  . HERNIA REPAIR  2007   incarcerated, open with biologic mesh  . HERNIA REPAIR  Aug2012   strangulated, redo open with biologic mesh  . SMALL INTESTINE SURGERY  09/12/10   SBR in incarcerated [place]   Social History   Occupational History  . Not on file  Tobacco Use  . Smoking status:  Never Smoker  . Smokeless tobacco: Never Used  Substance and Sexual Activity  . Alcohol use: No  . Drug use: No  . Sexual activity: Never    Birth control/protection: Abstinence

## 2017-08-24 ENCOUNTER — Ambulatory Visit (INDEPENDENT_AMBULATORY_CARE_PROVIDER_SITE_OTHER): Payer: Self-pay | Admitting: Orthopaedic Surgery

## 2017-08-29 ENCOUNTER — Ambulatory Visit: Payer: Self-pay | Admitting: *Deleted

## 2017-08-29 ENCOUNTER — Encounter: Payer: Self-pay | Admitting: Physician Assistant

## 2017-08-29 NOTE — Telephone Encounter (Signed)
Pt reports saw S. Weber 08/13/17 for dry cough after starting Lisinopril; changed to Benicar.Pt states cough has not improved, "Keeps me awake at night." States productive for minimal amounts of thick white phlegm.  Has tried Mucinex. Denies fever, SOB, no wheezing. During call while NT securing appointment pt decided she wanted to try "Stronger Mucinex" and CB if not improved. Home care advise given; pt instructed to CB if not improved with OTC meds, any SOB, wheezing, fever present.  Reason for Disposition . Cough has been present for > 3 weeks  Answer Assessment - Initial Assessment Questions 1. ONSET: "When did the cough begin?"      About 4 weeks ago 2. SEVERITY: "How bad is the cough today?"      Moderate 3. RESPIRATORY DISTRESS: "Describe your breathing."      No 4. FEVER: "Do you have a fever?" If so, ask: "What is your temperature, how was it measured, and when did it start?"     no 5. SPUTUM: "Describe the color of your sputum" (clear, white, yellow, green)     White thick, minimal 6. HEMOPTYSIS: "Are you coughing up any blood?" If so ask: "How much?" (flecks, streaks, tablespoons, etc.)     no 7. CARDIAC HISTORY: "Do you have any history of heart disease?" (e.g., heart attack, congestive heart failure)      HTN 8. LUNG HISTORY: "Do you have any history of lung disease?"  (e.g., pulmonary embolus, asthma, emphysema)     9. PE RISK FACTORS: "Do you have a history of blood clots?" (or: recent major surgery, recent prolonged travel, bedridden)    No 10. OTHER SYMPTOMS: "Do you have any other symptoms?" (e.g., runny nose, wheezing, chest pain)       No  Protocols used: COUGH - ACUTE PRODUCTIVE-A-AH

## 2017-08-29 NOTE — Telephone Encounter (Signed)
This encounter was created in error - please disregard.

## 2017-09-13 ENCOUNTER — Encounter: Payer: Self-pay | Admitting: Physician Assistant

## 2017-09-13 ENCOUNTER — Other Ambulatory Visit: Payer: Self-pay

## 2017-09-13 ENCOUNTER — Ambulatory Visit: Payer: BC Managed Care – PPO | Admitting: Physician Assistant

## 2017-09-13 VITALS — BP 160/82 | HR 98 | Temp 98.7°F | Resp 18 | Ht 61.0 in | Wt 215.6 lb

## 2017-09-13 DIAGNOSIS — R05 Cough: Secondary | ICD-10-CM

## 2017-09-13 DIAGNOSIS — R059 Cough, unspecified: Secondary | ICD-10-CM

## 2017-09-13 DIAGNOSIS — L989 Disorder of the skin and subcutaneous tissue, unspecified: Secondary | ICD-10-CM

## 2017-09-13 DIAGNOSIS — I10 Essential (primary) hypertension: Secondary | ICD-10-CM | POA: Diagnosis not present

## 2017-09-13 MED ORDER — OLMESARTAN MEDOXOMIL 5 MG PO TABS: 10.0000 mg | ORAL_TABLET | Freq: Every day | ORAL | 1 refills | Status: DC

## 2017-09-13 NOTE — Patient Instructions (Addendum)
   Benicar 5 mg - take 2 pills check your BP   Start over the counter zyrtec or claritin - this is an allergy medication that should help the cough.  [address], [place], [place] [address] Phone: [phone]   If you have lab work done today you will be contacted with your lab results within the next 2 weeks.  If you have not heard from [place] then please contact us. The fastest way to get your results is to register for My Chart.   IF you received an x-ray today, you will receive an invoice from [facility]. Please contact [facility] at [phone] with questions or concerns regarding your invoice.   IF you received labwork today, you will receive an invoice from [facility]. Please contact [facility] at [phone] with questions or concerns regarding your invoice.   Our billing staff will not be able to assist you with questions regarding bills from these companies.  You will be contacted with the lab results as soon as they are available. The fastest way to get your results is to activate your My Chart account. Instructions are located on the last page of this paperwork. If you have not heard from [place] regarding the results in 2 weeks, please contact this office.

## 2017-09-13 NOTE — Progress Notes (Signed)
[name]  MRN: [id number] DOB: Dec 16, 1955  PCP: Mancel Bale, PA-C  Chief Complaint  Patient presents with  . Hypertension    follow up also has spot on right leg     Subjective:  Pt presents to clinic for HTN check since changing lisinopril to benicar about 1 month ago due to possible cough from the medication.  She thinks the cough has gotten a little better but it is now productive and she is using mucinex to help with the cough.  She is having no SOB or LE edema.  She feels really good.  She does not typically get allergies in the fall.  She has an area on her right leg that has been itching and she would like me to look at it. It does not hurt.  She does not remember getting bitten or stung by anything.  She has been checking BP at home 150/80s  History is obtained by patient.  Review of Systems  Constitutional: Negative for chills and fever.  Eyes: Negative for visual disturbance.  Respiratory: Positive for cough (clear mucus). Negative for shortness of breath.   Cardiovascular: Negative for chest pain, palpitations and leg swelling.  Neurological: Negative for dizziness, light-headedness and headaches.    Patient Active Problem List   Diagnosis Date Noted  . Body mass index 40.0-44.9, adult ([place]) 07/03/2017  . Nontraumatic complete tear of left rotator cuff 07/03/2017  . Chronic left shoulder pain 06/05/2017  . Essential hypertension 05/06/2014  . Hyperlipidemia 05/06/2014  . Strangulated recurrent ventral incisional hernia, s/p small intestine resection 09/22/2010  . Morbid obesity ([place]) 09/22/2010  . Edema of lower extremities 09/22/2010    Current Outpatient Medications on File Prior to Visit  Medication Sig Dispense Refill  . amLODipine (NORVASC) 10 MG tablet TAKE 1 TABLET(10 MG) BY MOUTH DAILY 90 tablet 0   No current facility-administered medications on file prior to visit.     Allergies  Allergen Reactions  . Lisinopril Cough    Past  Medical History:  Diagnosis Date  . Abdominal distention   . Abdominal pain   . Arthritis   . Blood transfusion without reported diagnosis   . Hernia   . Hyperlipidemia   . Hypertension   . Leg swelling   . Strangulated recurrent ventral incisional hernia, s/p small intestine resection 09/22/2010   Social History   Social History Narrative  . Not on file   Social History   Tobacco Use  . Smoking status: Never Smoker  . Smokeless tobacco: Never Used  Substance Use Topics  . Alcohol use: No  . Drug use: No   family history includes Heart disease in her father and mother; Hypertension in her sister.     Objective:  BP (!) 160/82   Pulse 98   Temp 98.7 F (37.1 C) (Oral)   Resp 18   Ht 5\' 1"  (1.549 m)   Wt 215 lb 9.6 oz (97.8 kg)   SpO2 98%   BMI 40.74 kg/m  Body mass index is 40.74 kg/m.  Wt Readings from Last 3 Encounters:  09/13/17 215 lb 9.6 oz (97.8 kg)  08/13/17 217 lb (98.4 kg)  07/11/17 217 lb 6.4 oz (98.6 kg)    Physical Exam  Constitutional: She is oriented to person, place, and time. She appears well-developed and well-nourished.  HENT:  Head: Normocephalic and atraumatic.  Right Ear: Hearing and external ear normal.  Left Ear: Hearing and external ear normal.  Eyes: Conjunctivae are normal.  Neck: Normal range of motion.  Cardiovascular: Normal rate, regular rhythm and normal heart sounds.  No murmur heard. Pulmonary/Chest: Effort normal and breath sounds normal. She has no wheezes.  Cough that sounds like it is coming from her throat  Musculoskeletal:       Right lower leg: She exhibits no edema.       Left lower leg: She exhibits no edema.  Neurological: She is alert and oriented to person, place, and time.  Skin: Skin is warm and dry.     Psychiatric: She has a normal mood and affect. Her behavior is normal. Judgment and thought content normal.  Vitals reviewed.   Assessment and Plan :  Cough- seems overall better but now productive -  will treat with OTC allergy medication and monitor over the next several weeks  Essential hypertension - Plan: olmesartan (BENICAR) 5 MG tablet - I would like better BP control - we will increase Benicar to 10mg  and she will continue to monitor at home - she is still having PT for her left shoulder and she had PT yesterday and she is having a lot of pain today - her BP at home is typically better than her readings were today but she needs tighter control  Leg skin lesion, right - insect bite, ? Spider bite - monitor and OTC antihistamine should help with this  Patient verbalized to me that they understand the following: diagnosis, what is being done for them, what to expect and what should be done at home.  Their questions have been answered.  See after visit summary for patient specific instructions.  Windell Hummingbird PA-C  [facility] 09/13/2017 8:46 AM  Please note: Portions of this report may have been transcribed using dragon voice recognition software. Every effort was made to ensure accuracy; however, inadvertent computerized transcription errors may be present.

## 2017-09-18 ENCOUNTER — Encounter (INDEPENDENT_AMBULATORY_CARE_PROVIDER_SITE_OTHER): Payer: Self-pay | Admitting: Orthopaedic Surgery

## 2017-09-18 ENCOUNTER — Ambulatory Visit (INDEPENDENT_AMBULATORY_CARE_PROVIDER_SITE_OTHER): Payer: BC Managed Care – PPO | Admitting: Orthopaedic Surgery

## 2017-09-18 DIAGNOSIS — M75122 Complete rotator cuff tear or rupture of left shoulder, not specified as traumatic: Secondary | ICD-10-CM

## 2017-09-18 MED ORDER — IBUPROFEN 800 MG PO TABS: 800.0000 mg | ORAL_TABLET | Freq: Three times a day (TID) | ORAL | 2 refills | Status: DC | PRN

## 2017-09-18 NOTE — Progress Notes (Signed)
[name] is approximately 10 weeks status post left shoulder arthroscopy with rotator cuff repair.  Overall she is doing well.  She endorses some soreness but no pain.  Her surgical scars are fully healed.  Examination left shoulder shows significantly improved forward flexion external rotation and internal rotation.  Her strength is 4 out of 5.  She does have some weakness and discomfort with terminal range of motion.  At this point I would like her to continue to work with physical therapy and home exercises.  Her job requires a lot of lifting so I do want her to be fully recovered before we consider letting her go back to full duty.  She can return back to full duty September 30.  Recheck in about 6 weeks.

## 2017-09-20 ENCOUNTER — Telehealth (INDEPENDENT_AMBULATORY_CARE_PROVIDER_SITE_OTHER): Payer: Self-pay | Admitting: Orthopaedic Surgery

## 2017-09-20 NOTE — Telephone Encounter (Signed)
Yes she can return back to work on either of those days.  Up to her.

## 2017-09-20 NOTE — Telephone Encounter (Signed)
See message below °

## 2017-09-20 NOTE — Telephone Encounter (Signed)
Patient called asked if she can return to work on the 10/14th or 10/15th after going to therapy this morning. Patient said her shoulder is still swollen. The number to contact patient is [phone]

## 2017-09-21 ENCOUNTER — Encounter (INDEPENDENT_AMBULATORY_CARE_PROVIDER_SITE_OTHER): Payer: Self-pay

## 2017-09-21 NOTE — Telephone Encounter (Signed)
Will mail letter.  

## 2017-09-21 NOTE — Telephone Encounter (Signed)
Please mail letter   Patient called back would like the 15th of October for letter.

## 2017-09-21 NOTE — Telephone Encounter (Signed)
Called patient no answer. LMOM   Either one of those two date are fine to RTW. She can call us back to see which date she would like so I can make a letter.

## 2017-10-18 ENCOUNTER — Telehealth (INDEPENDENT_AMBULATORY_CARE_PROVIDER_SITE_OTHER): Payer: Self-pay | Admitting: Orthopaedic Surgery

## 2017-10-18 NOTE — Telephone Encounter (Signed)
Patient called asked if the note can be faxed to [facility] [employer]. The fax# is [phone]   Attn: Trace Worslet  (See previous note) The number to contact patient is [phone]

## 2017-10-18 NOTE — Telephone Encounter (Signed)
Please advise 

## 2017-10-18 NOTE — Telephone Encounter (Signed)
Patient called to state that she needed a new note to return to work and it needs to state that she can return with no restrictions.  Please call patient to advise.

## 2017-10-19 ENCOUNTER — Encounter (INDEPENDENT_AMBULATORY_CARE_PROVIDER_SITE_OTHER): Payer: Self-pay

## 2017-10-19 NOTE — Telephone Encounter (Signed)
yes

## 2017-10-19 NOTE — Telephone Encounter (Signed)
Faxed note.

## 2017-10-19 NOTE — Telephone Encounter (Signed)
Work note faxed to Attn: Designer, television/film [employer]

## 2017-10-23 ENCOUNTER — Other Ambulatory Visit: Payer: Self-pay | Admitting: Physician Assistant

## 2017-10-23 DIAGNOSIS — Z1231 Encounter for screening mammogram for malignant neoplasm of breast: Secondary | ICD-10-CM

## 2017-10-30 ENCOUNTER — Ambulatory Visit (INDEPENDENT_AMBULATORY_CARE_PROVIDER_SITE_OTHER): Payer: BC Managed Care – PPO | Admitting: Orthopaedic Surgery

## 2017-10-30 DIAGNOSIS — M75122 Complete rotator cuff tear or rupture of left shoulder, not specified as traumatic: Secondary | ICD-10-CM

## 2017-10-30 DIAGNOSIS — M7542 Impingement syndrome of left shoulder: Secondary | ICD-10-CM

## 2017-10-30 MED ORDER — ACETAMINOPHEN-CODEINE #3 300-30 MG PO TABS: 1.0000 | ORAL_TABLET | Freq: Every day | ORAL | 0 refills | Status: AC | PRN

## 2017-10-30 NOTE — Progress Notes (Signed)
Office Visit Note   Patient: [name]           Date of Birth: 1955-04-09           MRN: [id number] Visit Date: 10/30/2017              Requested by: [name], Virginia Hippo, PA-C No address on file PCP: Mancel Bale, PA-C   Assessment & Plan: Visit Diagnoses:  1. Nontraumatic complete tear of left rotator cuff   2. Impingement syndrome of left shoulder     Plan: [name] has done quite well from her surgery and has returned back to work.  I would limit lifting to less than 15 pounds.  I gave her a prescription for Tylenol 3 for more intense pain.  She will continue with home exercise.  Overall I am happy with her progress.  I like to recheck her in 2 months.  Follow-Up Instructions: Return in about 2 months (around 12/30/2017).   Orders:  No orders of the defined types were placed in this encounter.  Meds ordered this encounter  Medications  . acetaminophen-codeine (TYLENOL #3) 300-30 MG tablet    Sig: Take 1-2 tablets by mouth daily as needed for up to 7 days for moderate pain.    Dispense:  30 tablet    Refill:  0      Procedures: No procedures performed   Clinical Data: No additional findings.   Subjective: Chief Complaint  Patient presents with  . Left Shoulder - Pain    [name] is almost 4 months status post left shoulder arthroscopy and rotator cuff repair.  She is doing well overall has returned back to work.  She states that she has pain every now and then.  She does have some mild stiffness and she has had to stop physical therapy because it is too expensive.  She is doing home exercises.   Review of Systems   Objective: Vital Signs: There were no vitals taken for this visit.  Physical Exam  Ortho Exam Left shoulder exam shows fully healed surgical scars.  Rotator cuff function is appropriate without significant pain. Specialty Comments:  No specialty comments available.  Imaging: No results found.   PMFS History: Patient Active Problem  List   Diagnosis Date Noted  . Body mass index 40.0-44.9, adult ([place]) 07/03/2017  . Nontraumatic complete tear of left rotator cuff 07/03/2017  . Chronic left shoulder pain 06/05/2017  . Essential hypertension 05/06/2014  . Hyperlipidemia 05/06/2014  . Strangulated recurrent ventral incisional hernia, s/p small intestine resection 09/22/2010  . Morbid obesity ([place]) 09/22/2010  . Edema of lower extremities 09/22/2010   Past Medical History:  Diagnosis Date  . Abdominal distention   . Abdominal pain   . Arthritis   . Blood transfusion without reported diagnosis   . Hernia   . Hyperlipidemia   . Hypertension   . Leg swelling   . Strangulated recurrent ventral incisional hernia, s/p small intestine resection 09/22/2010    Family History  Problem Relation Age of Onset  . Heart disease Mother        heart attack   . Heart disease Father        heart attack   . Hypertension Sister     Past Surgical History:  Procedure Laterality Date  . ABDOMINAL HYSTERECTOMY    . bowel obstruction  09/12/2010  . HERNIA REPAIR  2007   incarcerated, open with biologic mesh  . HERNIA REPAIR  Aug2012  strangulated, redo open with biologic mesh  . SMALL INTESTINE SURGERY  09/12/10   SBR in incarcerated [place]   Social History   Occupational History  . Not on file  Tobacco Use  . Smoking status: Never Smoker  . Smokeless tobacco: Never Used  Substance and Sexual Activity  . Alcohol use: No  . Drug use: No  . Sexual activity: Never    Birth control/protection: Abstinence

## 2017-11-27 ENCOUNTER — Other Ambulatory Visit: Payer: Self-pay | Admitting: Family Medicine

## 2017-11-27 ENCOUNTER — Ambulatory Visit
Admission: RE | Admit: 2017-11-27 | Discharge: 2017-11-27 | Disposition: A | Discharge status: Home / Self Care | Payer: BC Managed Care – PPO | Source: Ambulatory Visit | Attending: Physician Assistant | Admitting: Physician Assistant

## 2017-11-27 DIAGNOSIS — Z1231 Encounter for screening mammogram for malignant neoplasm of breast: Secondary | ICD-10-CM

## 2017-12-04 ENCOUNTER — Other Ambulatory Visit (INDEPENDENT_AMBULATORY_CARE_PROVIDER_SITE_OTHER): Payer: Self-pay | Admitting: Orthopaedic Surgery

## 2017-12-05 ENCOUNTER — Telehealth (INDEPENDENT_AMBULATORY_CARE_PROVIDER_SITE_OTHER): Payer: Self-pay | Admitting: Orthopaedic Surgery

## 2017-12-05 NOTE — Telephone Encounter (Signed)
Medication Refill  Tylenol 3   Pharmacy  [facility] [address]

## 2017-12-06 ENCOUNTER — Encounter: Payer: Self-pay | Admitting: Family Medicine

## 2017-12-06 NOTE — Telephone Encounter (Signed)
Patient aware.

## 2017-12-06 NOTE — Telephone Encounter (Signed)
Rx ready for pick up. 

## 2018-01-01 ENCOUNTER — Ambulatory Visit (INDEPENDENT_AMBULATORY_CARE_PROVIDER_SITE_OTHER): Payer: BC Managed Care – PPO | Admitting: Orthopaedic Surgery

## 2018-01-01 ENCOUNTER — Encounter (INDEPENDENT_AMBULATORY_CARE_PROVIDER_SITE_OTHER): Payer: Self-pay | Admitting: Orthopaedic Surgery

## 2018-01-01 DIAGNOSIS — M75122 Complete rotator cuff tear or rupture of left shoulder, not specified as traumatic: Secondary | ICD-10-CM

## 2018-01-01 DIAGNOSIS — M7542 Impingement syndrome of left shoulder: Secondary | ICD-10-CM

## 2018-01-01 NOTE — Progress Notes (Signed)
   Post-Op Visit Note   Patient: [name]           Date of Birth: 1955-04-15           MRN: [id number] Visit Date: 01/01/2018 PCP: Mancel Bale, PA-C   Assessment & Plan:  Chief Complaint:  Chief Complaint  Patient presents with  . Left Shoulder - Pain   Visit Diagnoses:  1. Nontraumatic complete tear of left rotator cuff   2. Impingement syndrome of left shoulder     Plan: Ms. Lamarca is 6 months status post left shoulder arthroscopy with debridement and subacromial decompression distal clavicle excision.  She is doing well and has returned back to work.  She occasionally has some pain but this is significantly improved compared to before surgery.  She take an occasional Tylenol.  Her physical exam shows good strength with manual muscle testing of the rotator cuff.  Her range of motion is essentially normal both actively and passively.  From my standpoint she has recovered well from her shoulder surgery and we can see her back as needed.  She is to continue with home exercises for continued strengthening.  Questions encouraged and answered.  Follow-up as needed.  Follow-Up Instructions: No follow-ups on file.   Orders:  No orders of the defined types were placed in this encounter.  No orders of the defined types were placed in this encounter.   Imaging: No results found.  PMFS History: Patient Active Problem List   Diagnosis Date Noted  . Body mass index 40.0-44.9, adult ([place]) 07/03/2017  . Nontraumatic complete tear of left rotator cuff 07/03/2017  . Chronic left shoulder pain 06/05/2017  . Essential hypertension 05/06/2014  . Hyperlipidemia 05/06/2014  . Strangulated recurrent ventral incisional hernia, s/p small intestine resection 09/22/2010  . Morbid obesity ([place]) 09/22/2010  . Edema of lower extremities 09/22/2010   Past Medical History:  Diagnosis Date  . Abdominal distention   . Abdominal pain   . Arthritis   . Blood transfusion without reported  diagnosis   . Hernia   . Hyperlipidemia   . Hypertension   . Leg swelling   . Strangulated recurrent ventral incisional hernia, s/p small intestine resection 09/22/2010    Family History  Problem Relation Age of Onset  . Heart disease Mother        heart attack   . Heart disease Father        heart attack   . Hypertension Sister     Past Surgical History:  Procedure Laterality Date  . ABDOMINAL HYSTERECTOMY    . bowel obstruction  09/12/2010  . HERNIA REPAIR  2007   incarcerated, open with biologic mesh  . HERNIA REPAIR  Aug2012   strangulated, redo open with biologic mesh  . SMALL INTESTINE SURGERY  09/12/10   SBR in incarcerated [place]   Social History   Occupational History  . Not on file  Tobacco Use  . Smoking status: Never Smoker  . Smokeless tobacco: Never Used  Substance and Sexual Activity  . Alcohol use: No  . Drug use: No  . Sexual activity: Never    Birth control/protection: Abstinence

## 2018-01-04 ENCOUNTER — Telehealth (INDEPENDENT_AMBULATORY_CARE_PROVIDER_SITE_OTHER): Payer: Self-pay | Admitting: Orthopaedic Surgery

## 2018-01-04 MED ORDER — ACETAMINOPHEN-CODEINE #3 300-30 MG PO TABS: 1.0000 | ORAL_TABLET | Freq: Three times a day (TID) | ORAL | 0 refills | Status: DC | PRN

## 2018-01-04 NOTE — Telephone Encounter (Signed)
Please advise 

## 2018-01-04 NOTE — Telephone Encounter (Signed)
Patient called needing something for pain. (Tylenol 3) The number to contact patient is [phone]

## 2018-03-19 ENCOUNTER — Telehealth (INDEPENDENT_AMBULATORY_CARE_PROVIDER_SITE_OTHER): Payer: Self-pay | Admitting: Orthopaedic Surgery

## 2018-03-19 NOTE — Telephone Encounter (Signed)
Pt called asking if she could get a rx for tylenol or ibuprofen.

## 2018-03-20 NOTE — Telephone Encounter (Signed)
Tylenol 1000 mg QID prn pain #60

## 2018-03-20 NOTE — Telephone Encounter (Signed)
Please advise 

## 2018-03-21 ENCOUNTER — Other Ambulatory Visit (INDEPENDENT_AMBULATORY_CARE_PROVIDER_SITE_OTHER): Payer: Self-pay

## 2018-03-21 MED ORDER — ACETAMINOPHEN 500 MG PO TABS: ORAL_TABLET | ORAL | 0 refills | Status: DC

## 2018-03-21 NOTE — Telephone Encounter (Signed)
Sent in to pharm.

## 2018-07-27 ENCOUNTER — Other Ambulatory Visit (INDEPENDENT_AMBULATORY_CARE_PROVIDER_SITE_OTHER): Payer: Self-pay | Admitting: Orthopaedic Surgery

## 2018-07-29 NOTE — Telephone Encounter (Signed)
Ok to rf? 

## 2018-10-16 ENCOUNTER — Other Ambulatory Visit: Payer: Self-pay | Admitting: Family Medicine

## 2018-10-16 DIAGNOSIS — Z1231 Encounter for screening mammogram for malignant neoplasm of breast: Secondary | ICD-10-CM

## 2018-12-03 ENCOUNTER — Other Ambulatory Visit: Payer: Self-pay

## 2018-12-03 ENCOUNTER — Ambulatory Visit
Admission: RE | Admit: 2018-12-03 | Discharge: 2018-12-03 | Disposition: A | Discharge status: Home / Self Care | Payer: BC Managed Care – PPO | Source: Ambulatory Visit | Attending: Family Medicine | Admitting: Family Medicine

## 2018-12-03 DIAGNOSIS — Z1231 Encounter for screening mammogram for malignant neoplasm of breast: Secondary | ICD-10-CM

## 2019-01-01 ENCOUNTER — Other Ambulatory Visit: Payer: Self-pay

## 2019-01-01 ENCOUNTER — Ambulatory Visit: Payer: BC Managed Care – PPO | Admitting: Orthopaedic Surgery

## 2019-01-01 ENCOUNTER — Encounter: Payer: Self-pay | Admitting: Orthopaedic Surgery

## 2019-01-01 DIAGNOSIS — M545 Low back pain: Secondary | ICD-10-CM | POA: Diagnosis not present

## 2019-01-01 DIAGNOSIS — G8929 Other chronic pain: Secondary | ICD-10-CM | POA: Diagnosis not present

## 2019-01-01 MED ORDER — PREDNISONE 10 MG (21) PO TBPK: ORAL_TABLET | ORAL | 0 refills | Status: DC

## 2019-01-01 NOTE — Progress Notes (Signed)
Office Visit Note   Patient: [name]           Date of Birth: [birth year]           MRN: [id number] Visit Date: 01/01/2019              Requested by: Mancel Bale, PA-C [address] [place],  [place] [address] PCP: Mancel Bale, PA-C   Assessment & Plan: Visit Diagnoses:  1. Chronic left-sided low back pain without sciatica     Plan: Impression is lumbar degenerative disc disease and facet arthropathy.  We will start the patient with Sterapred taper muscle relaxer.  She does not think she can afford physical therapy so I provided her with a home exercise program.  She will follow-up with [place] as needed.  Follow-Up Instructions: Return if symptoms worsen or fail to improve.   Orders:  No orders of the defined types were placed in this encounter.  No orders of the defined types were placed in this encounter.     Procedures: No procedures performed   Clinical Data: No additional findings.   Subjective: Chief Complaint  Patient presents with  . Lower Back - Pain    HPI patient is a pleasant 63 year old female who comes in today with chronic left lower back pain.  She has been dealing with this for the past year.  No known injury.  She does work in [employer] where she is constantly standing on her feet.  The pain she has is to the left lower back.  No radiation to her lower leg.  Pain is worse when she is sitting in a car or sitting in a low chair.  She does have associated muscle spasms.  She has tried heating pad and menthol pads without significant relief of symptoms.  No numbness, tingling or burning.  No weakness.  No bowel or bladder change and no saddle paresthesias.  Of note, she was seen by [facility] where x-rays were obtained.  X-rays demonstrated multilevel degenerative disc disease and facet arthropathy but nothing more.  Review of Systems as detailed in HPI.  All others reviewed and are negative.   Objective: Vital Signs: There were no  vitals taken for this visit.  Physical Exam well-developed and well-nourished female in no acute distress.  Alert and oriented x3.  Ortho Exam examination of her lumbar spine reveals no spinous tenderness.  She has mild left-sided paraspinous tenderness.  Mild pain with lumbar flexion and extension.  Negative straight leg raise.  Negative logroll.  No focal weakness.  She is neurovascular intact distally.  Specialty Comments:  No specialty comments available.  Imaging: No new imaging   PMFS History: Patient Active Problem List   Diagnosis Date Noted  . Body mass index 40.0-44.9, adult ([place]) 07/03/2017  . Nontraumatic complete tear of left rotator cuff 07/03/2017  . Chronic left shoulder pain 06/05/2017  . Essential hypertension 05/06/2014  . Hyperlipidemia 05/06/2014  . Strangulated recurrent ventral incisional hernia, s/p small intestine resection 09/22/2010  . Morbid obesity ([place]) 09/22/2010  . Edema of lower extremities 09/22/2010   Past Medical History:  Diagnosis Date  . Abdominal distention   . Abdominal pain   . Arthritis   . Blood transfusion without reported diagnosis   . Hernia   . Hyperlipidemia   . Hypertension   . Leg swelling   . Strangulated recurrent ventral incisional hernia, s/p small intestine resection 09/22/2010    Family History  Problem  Relation Age of Onset  . Heart disease Mother        heart attack   . Heart disease Father        heart attack   . Hypertension Sister     Past Surgical History:  Procedure Laterality Date  . ABDOMINAL HYSTERECTOMY    . bowel obstruction  09/12/2010  . HERNIA REPAIR  2007   incarcerated, open with biologic mesh  . HERNIA REPAIR  Aug2012   strangulated, redo open with biologic mesh  . SMALL INTESTINE SURGERY  09/12/10   SBR in incarcerated [place]   Social History   Occupational History  . Not on file  Tobacco Use  . Smoking status: Never Smoker  . Smokeless tobacco: Never Used  Substance and Sexual Activity   . Alcohol use: No  . Drug use: No  . Sexual activity: Never    Birth control/protection: Abstinence

## 2019-01-03 ENCOUNTER — Telehealth: Payer: Self-pay | Admitting: Orthopaedic Surgery

## 2019-01-03 NOTE — Telephone Encounter (Signed)
Patient called advised the prednisone made the pain worse and she stopped taking it. Patient asked if she can get a muscle relaxer?  Patient uses the [facility] on [address]  Ph number:  [phone] The number to contact patient is [phone]

## 2019-01-03 NOTE — Telephone Encounter (Signed)
Please advise 

## 2019-01-06 ENCOUNTER — Other Ambulatory Visit: Payer: Self-pay | Admitting: Physician Assistant

## 2019-01-06 MED ORDER — METHOCARBAMOL 500 MG PO TABS: 500.0000 mg | ORAL_TABLET | Freq: Two times a day (BID) | ORAL | 0 refills | Status: DC | PRN

## 2019-01-06 NOTE — Telephone Encounter (Signed)
I called patient and advised. 

## 2019-01-06 NOTE — Telephone Encounter (Signed)
Sent in

## 2019-01-07 ENCOUNTER — Ambulatory Visit: Payer: BC Managed Care – PPO | Attending: Internal Medicine

## 2019-01-07 DIAGNOSIS — Z20822 Contact with and (suspected) exposure to covid-19: Secondary | ICD-10-CM

## 2019-01-08 LAB — NOVEL CORONAVIRUS, NAA: SARS-CoV-2, NAA: NOT DETECTED

## 2019-02-06 ENCOUNTER — Telehealth: Payer: Self-pay | Admitting: Orthopaedic Surgery

## 2019-02-06 NOTE — Telephone Encounter (Signed)
yes

## 2019-02-06 NOTE — Telephone Encounter (Signed)
Patient called and stated that she was going to get injection in her back and declined at last appt. She now wants to get that inj her back is having so much pain.  Please call patient to advise. [phone]

## 2019-02-06 NOTE — Telephone Encounter (Signed)
Okay for this

## 2019-02-07 ENCOUNTER — Other Ambulatory Visit: Payer: Self-pay

## 2019-02-07 DIAGNOSIS — G8929 Other chronic pain: Secondary | ICD-10-CM

## 2019-02-07 NOTE — Telephone Encounter (Signed)
ORDER MADE 

## 2019-02-19 ENCOUNTER — Encounter: Payer: BC Managed Care – PPO | Admitting: Physical Medicine and Rehabilitation

## 2019-02-20 ENCOUNTER — Ambulatory Visit (INDEPENDENT_AMBULATORY_CARE_PROVIDER_SITE_OTHER): Payer: BC Managed Care – PPO | Admitting: Physical Medicine and Rehabilitation

## 2019-02-20 ENCOUNTER — Ambulatory Visit: Payer: Self-pay

## 2019-02-20 ENCOUNTER — Other Ambulatory Visit: Payer: Self-pay

## 2019-02-20 VITALS — BP 145/92 | HR 86

## 2019-02-20 DIAGNOSIS — M47816 Spondylosis without myelopathy or radiculopathy, lumbar region: Secondary | ICD-10-CM | POA: Diagnosis not present

## 2019-02-20 MED ORDER — METHYLPREDNISOLONE ACETATE 80 MG/ML IJ SUSP
40.0000 mg | Freq: Once | INTRAMUSCULAR | Status: AC
  Administered 2019-02-20: 40 mg

## 2019-02-20 NOTE — Progress Notes (Signed)
 .  Numeric Pain Rating Scale and Functional Assessment Average Pain 6   In the last MONTH (on 0-10 scale) has pain interfered with the following?  1. General activity like being  able to carry out your everyday physical activities such as walking, climbing stairs, carrying groceries, or moving a chair?  Rating(7)   +Driver, -BT, -Dye Allergies.  

## 2019-02-24 NOTE — Procedures (Signed)
Lumbar Facet Joint Intra-Articular Injection(s) with Fluoroscopic Guidance  Patient: [name]      Date of Birth: October 16, 1955 MRN: [id number] PCP: Mancel Bale, PA-C      Visit Date: 02/20/2019   Universal Protocol:    Date/Time: 02/20/2019  Consent Given By: the patient  Position: PRONE   Additional Comments: Vital signs were monitored before and after the procedure. Patient was prepped and draped in the usual sterile fashion. The correct patient, procedure, and site was verified.   Injection Procedure Details:  Procedure Site One Meds Administered:  Meds ordered this encounter  Medications  . methylPREDNISolone acetate (DEPO-MEDROL) injection 40 mg     Laterality: Left  Location/Site:  L4-L5 L5-S1  Needle size: 22 guage  Needle type: Spinal  Needle Placement: Articular  Findings:  -Comments: Excellent flow of contrast producing a partial arthrogram.  Procedure Details: The fluoroscope beam is vertically oriented in AP, and the inferior recess is visualized beneath the lower pole of the inferior apophyseal process, which represents the target point for needle insertion. When direct visualization is difficult the target point is located at the medial projection of the vertebral pedicle. The region overlying each aforementioned target is locally anesthetized with a 1 to 2 ml. volume of 1% Lidocaine without Epinephrine.   The spinal needle was inserted into each of the above mentioned facet joints using biplanar fluoroscopic guidance. A 0.25 to 0.5 ml. volume of Isovue-250 was injected and a partial facet joint arthrogram was obtained. A single spot film was obtained of the resulting arthrogram.    One to 1.25 ml of the steroid/anesthetic solution was then injected into each of the facet joints noted above.   Additional Comments:  The patient tolerated the procedure well Dressing: 2 x 2 sterile gauze and Band-Aid    Post-procedure details: Patient was  observed during the procedure. Post-procedure instructions were reviewed.  Patient left the clinic in stable condition.

## 2019-02-24 NOTE — Progress Notes (Signed)
[name] - 64 y.o. female MRN [id number]  Date of birth: April 18, 1955  Office Visit Note: Visit Date: 02/20/2019 PCP: Mancel Bale, PA-C Referred by: Gale Journey Damaris Hippo, PA-C  Subjective: Chief Complaint  Patient presents with  . Lower Back - Pain   HPI:  [name] is a 64 y.o. female who comes in today For interventional spine procedure at the request of Dr. Eduard Roux.  Patient is having chronic worsening severe axial left-sided low back pain.  Worse with going from sit to stand and standing.  Imaging consistent with facet arthropathy without nerve compression.  No radicular pain noted.  I will complete diagnostic and therapeutic left-sided facet joint block at L4-5 and L5-S1.  ROS Otherwise per HPI.  Assessment & Plan: Visit Diagnoses:  1. Spondylosis without myelopathy or radiculopathy, lumbar region     Plan: No additional findings.   Meds & Orders:  Meds ordered this encounter  Medications  . methylPREDNISolone acetate (DEPO-MEDROL) injection 40 mg    Orders Placed This Encounter  Procedures  . Facet Injection  . XR C-ARM NO REPORT    Follow-up: Return for visit to requesting physician as needed.   Procedures: No procedures performed  Lumbar Facet Joint Intra-Articular Injection(s) with Fluoroscopic Guidance  Patient: [name]      Date of Birth: 21-Jun-1955 MRN: [id number] PCP: Mancel Bale, PA-C      Visit Date: 02/20/2019   Universal Protocol:    Date/Time: 02/20/2019  Consent Given By: the patient  Position: PRONE   Additional Comments: Vital signs were monitored before and after the procedure. Patient was prepped and draped in the usual sterile fashion. The correct patient, procedure, and site was verified.   Injection Procedure Details:  Procedure Site One Meds Administered:  Meds ordered this encounter  Medications  . methylPREDNISolone acetate (DEPO-MEDROL) injection 40 mg     Laterality: Left  Location/Site:  L4-L5  L5-S1  Needle size: 22 guage  Needle type: Spinal  Needle Placement: Articular  Findings:  -Comments: Excellent flow of contrast producing a partial arthrogram.  Procedure Details: The fluoroscope beam is vertically oriented in AP, and the inferior recess is visualized beneath the lower pole of the inferior apophyseal process, which represents the target point for needle insertion. When direct visualization is difficult the target point is located at the medial projection of the vertebral pedicle. The region overlying each aforementioned target is locally anesthetized with a 1 to 2 ml. volume of 1% Lidocaine without Epinephrine.   The spinal needle was inserted into each of the above mentioned facet joints using biplanar fluoroscopic guidance. A 0.25 to 0.5 ml. volume of Isovue-250 was injected and a partial facet joint arthrogram was obtained. A single spot film was obtained of the resulting arthrogram.    One to 1.25 ml of the steroid/anesthetic solution was then injected into each of the facet joints noted above.   Additional Comments:  The patient tolerated the procedure well Dressing: 2 x 2 sterile gauze and Band-Aid    Post-procedure details: Patient was observed during the procedure. Post-procedure instructions were reviewed.  Patient left the clinic in stable condition.     Clinical History: Acute Interface, Incoming Rad Results - 11/08/2018  8:04 AM EDT  TECHNIQUE: AP and lateral 3 view Lumbar spine.  CLINICAL HISTORY: Low back pain  COMPARISON: None.  FINDINGS:   Five lumbar type vertebrae are present.   No evidence of acute fracture or traumatic subluxation. .  Convex left  lumbar scoliosis.   Multilevel degenerative disc disease and facet arthropathy.  The paraspinal soft tissues appear normal.   IMPRESSION: Multilevel degenerative changes. No acute bony abnormality..  Electronically Signed by: Scot Jun     Objective:  VS:  HT:    WT:    BMI:     BP:(!) 145/92  HR:86bpm  TEMP: ( )  RESP:  Physical Exam  Ortho Exam Imaging: No results found.

## 2019-08-15 ENCOUNTER — Other Ambulatory Visit: Payer: Self-pay | Admitting: Physician Assistant

## 2019-08-15 DIAGNOSIS — Z13820 Encounter for screening for osteoporosis: Secondary | ICD-10-CM

## 2019-08-15 DIAGNOSIS — Z1231 Encounter for screening mammogram for malignant neoplasm of breast: Secondary | ICD-10-CM

## 2019-09-13 ENCOUNTER — Other Ambulatory Visit: Payer: Self-pay

## 2019-09-13 ENCOUNTER — Emergency Department (HOSPITAL_COMMUNITY): Payer: BC Managed Care – PPO

## 2019-09-13 ENCOUNTER — Emergency Department (HOSPITAL_COMMUNITY)
Admission: EM | Admit: 2019-09-13 | Discharge: 2019-09-13 | Disposition: A | Discharge status: Home / Self Care | Payer: BC Managed Care – PPO | Attending: Emergency Medicine | Admitting: Emergency Medicine

## 2019-09-13 ENCOUNTER — Encounter (HOSPITAL_COMMUNITY): Payer: Self-pay | Admitting: Emergency Medicine

## 2019-09-13 DIAGNOSIS — K5792 Diverticulitis of intestine, part unspecified, without perforation or abscess without bleeding: Secondary | ICD-10-CM

## 2019-09-13 DIAGNOSIS — I10 Essential (primary) hypertension: Secondary | ICD-10-CM | POA: Insufficient documentation

## 2019-09-13 DIAGNOSIS — K5732 Diverticulitis of large intestine without perforation or abscess without bleeding: Secondary | ICD-10-CM | POA: Insufficient documentation

## 2019-09-13 DIAGNOSIS — R109 Unspecified abdominal pain: Secondary | ICD-10-CM | POA: Diagnosis present

## 2019-09-13 DIAGNOSIS — Z79899 Other long term (current) drug therapy: Secondary | ICD-10-CM | POA: Diagnosis not present

## 2019-09-13 LAB — URINALYSIS, ROUTINE W REFLEX MICROSCOPIC
Bilirubin Urine: NEGATIVE
Glucose, UA: NEGATIVE mg/dL
Ketones, ur: 5 mg/dL — AB
Leukocytes,Ua: NEGATIVE
Nitrite: NEGATIVE
Protein, ur: 30 mg/dL — AB
Specific Gravity, Urine: 1.025 (ref 1.005–1.030)
pH: 5 (ref 5.0–8.0)

## 2019-09-13 LAB — CBC
HCT: 40.6 % (ref 36.0–46.0)
Hemoglobin: 12.9 g/dL (ref 12.0–15.0)
MCH: 28.6 pg (ref 26.0–34.0)
MCHC: 31.8 g/dL (ref 30.0–36.0)
MCV: 90 fL (ref 80.0–100.0)
Platelets: 255 10*3/uL (ref 150–400)
RBC: 4.51 MIL/uL (ref 3.87–5.11)
RDW: 15.4 % (ref 11.5–15.5)
WBC: 8.3 10*3/uL (ref 4.0–10.5)
nRBC: 0 % (ref 0.0–0.2)

## 2019-09-13 LAB — COMPREHENSIVE METABOLIC PANEL
ALT: 14 U/L (ref 0–44)
AST: 16 U/L (ref 15–41)
Albumin: 3.5 g/dL (ref 3.5–5.0)
Alkaline Phosphatase: 61 U/L (ref 38–126)
Anion gap: 13 (ref 5–15)
BUN: 18 mg/dL (ref 8–23)
CO2: 23 mmol/L (ref 22–32)
Calcium: 9.5 mg/dL (ref 8.9–10.3)
Chloride: 104 mmol/L (ref 98–111)
Creatinine, Ser: 1.3 mg/dL — ABNORMAL HIGH (ref 0.44–1.00)
GFR calc Af Amer: 50 mL/min — ABNORMAL LOW (ref >60)
GFR calc non Af Amer: 43 mL/min — ABNORMAL LOW (ref >60)
Glucose, Bld: 103 mg/dL — ABNORMAL HIGH (ref 70–99)
Potassium: 4.1 mmol/L (ref 3.5–5.1)
Sodium: 140 mmol/L (ref 135–145)
Total Bilirubin: 0.6 mg/dL (ref 0.3–1.2)
Total Protein: 7.2 g/dL (ref 6.5–8.1)

## 2019-09-13 LAB — LIPASE, BLOOD: Lipase: 21 U/L (ref 11–51)

## 2019-09-13 MED ORDER — IOHEXOL 300 MG/ML  SOLN
80.0000 mL | Freq: Once | INTRAMUSCULAR | Status: AC | PRN
  Administered 2019-09-13: 80 mL via INTRAVENOUS

## 2019-09-13 MED ORDER — ONDANSETRON 4 MG PO TBDP: 4.0000 mg | ORAL_TABLET | Freq: Three times a day (TID) | ORAL | 0 refills | Status: DC | PRN

## 2019-09-13 MED ORDER — AMOXICILLIN-POT CLAVULANATE 875-125 MG PO TABS: 1.0000 | ORAL_TABLET | Freq: Two times a day (BID) | ORAL | 0 refills | Status: DC

## 2019-09-13 NOTE — ED Provider Notes (Signed)
[facility] EMERGENCY DEPARTMENT Provider Note   CSN: [id number] Arrival date & time: 09/13/19  0923     History Chief Complaint  Patient presents with  . Abdominal Pain    [name] is a 64 y.o. female.  HPI   Patient is a 64 year old female with a history of recurrent central incisional hernia, hypertension, abdominal pain, abdominal distention who presents due to 3 days of abdominal pain. Patient reports a ventral hernia in the region and states that her abdominal pain began about 3 days ago. Her pain waxes and wanes and seems to improve with massaging the region as well as applying warm compresses. She took two antacids last night and states that she experienced an episode of nausea and vomiting but denies any current nausea. No hematemesis. Patient notes little p.o. intake the past 3 days because "she was nervous it would make her symptoms worse". Her current pain is 5/10. Her most recent BM was 2 days ago and states that it was normal. No melena or hematochezia. She has no other physical complaints at this time. No fevers, chills, chest pain, shortness of breath, urinary changes, syncope.     Past Medical History:  Diagnosis Date  . Abdominal distention   . Abdominal pain   . Arthritis   . Blood transfusion without reported diagnosis   . Hernia   . Hyperlipidemia   . Hypertension   . Leg swelling   . Strangulated recurrent ventral incisional hernia, s/p small intestine resection 09/22/2010    Patient Active Problem List   Diagnosis Date Noted  . Body mass index 40.0-44.9, adult ([place]) 07/03/2017  . Nontraumatic complete tear of left rotator cuff 07/03/2017  . Chronic left shoulder pain 06/05/2017  . Essential hypertension 05/06/2014  . Hyperlipidemia 05/06/2014  . Strangulated recurrent ventral incisional hernia, s/p small intestine resection 09/22/2010  . Morbid obesity ([place]) 09/22/2010  . Edema of lower extremities 09/22/2010    Past  Surgical History:  Procedure Laterality Date  . ABDOMINAL HYSTERECTOMY    . bowel obstruction  09/12/2010  . HERNIA REPAIR  2007   incarcerated, open with biologic mesh  . HERNIA REPAIR  Aug2012   strangulated, redo open with biologic mesh  . SMALL INTESTINE SURGERY  09/12/10   SBR in incarcerated [place]     OB History   No obstetric history on file.     Family History  Problem Relation Age of Onset  . Heart disease Mother        heart attack   . Heart disease Father        heart attack   . Hypertension Sister     Social History   Tobacco Use  . Smoking status: Never Smoker  . Smokeless tobacco: Never Used  Vaping Use  . Vaping Use: Never used  Substance Use Topics  . Alcohol use: No  . Drug use: No    Home Medications Prior to Admission medications   Medication Sig Start Date End Date Taking? Authorizing Provider  amLODipine (NORVASC) 10 MG tablet TAKE 1 TABLET(10 MG) BY MOUTH DAILY 07/11/17   Weber, Damaris Hippo, PA-C  ibuprofen (ADVIL) 800 MG tablet TAKE 1 TABLET(800 MG) BY MOUTH EVERY 8 HOURS AS NEEDED 07/29/18   Aundra Dubin, PA-C  methocarbamol (ROBAXIN) 500 MG tablet Take 1 tablet (500 mg total) by mouth 2 (two) times daily as needed. 01/06/19   Aundra Dubin, PA-C  rosuvastatin (CRESTOR) 20 MG tablet TAKE 1 TABLET(20 MG)  BY MOUTH AT BEDTIME 07/26/18   [provider]    Allergies    Lisinopril  Review of Systems   Review of Systems  All other systems reviewed and are negative. Ten systems reviewed and are negative for acute change, except as noted in the HPI.   Physical Exam Updated Vital Signs BP 107/75   Pulse 83   Temp 98.2 F (36.8 C) (Oral)   Resp 18   SpO2 100%   Physical Exam Vitals and nursing note reviewed.  Constitutional:      General: She is not in acute distress.    Appearance: Normal appearance. She is not ill-appearing, toxic-appearing or diaphoretic.  HENT:     Head: Normocephalic and atraumatic.     Right Ear: External  ear normal.     Left Ear: External ear normal.     Nose: Nose normal.     Mouth/Throat:     Mouth: Mucous membranes are moist.     Pharynx: Oropharynx is clear. No oropharyngeal exudate or posterior oropharyngeal erythema.  Eyes:     Extraocular Movements: Extraocular movements intact.  Cardiovascular:     Rate and Rhythm: Normal rate and regular rhythm.     Pulses: Normal pulses.     Heart sounds: Normal heart sounds. No murmur heard.  No friction rub. No gallop.   Pulmonary:     Effort: Pulmonary effort is normal. No respiratory distress.     Breath sounds: Normal breath sounds. No stridor. No wheezing, rhonchi or rales.  Abdominal:     General: Abdomen is flat and protuberant. A surgical scar is present. There is no distension.     Palpations: Abdomen is soft.     Tenderness: There is abdominal tenderness.     Hernia: A hernia is present. Hernia is present in the ventral area.     Comments: 4 to 5 inch ventral hernia noted superior to the umbilicus. Mild TTP noted in the region. Hernia appears to be reducible.  Abdomen is soft and no other regions are tender.  Musculoskeletal:        General: Normal range of motion.     Cervical back: Normal range of motion and neck supple. No tenderness.  Skin:    General: Skin is warm and dry.  Neurological:     General: No focal deficit present.     Mental Status: She is alert and oriented to person, place, and time.  Psychiatric:        Mood and Affect: Mood normal.        Behavior: Behavior normal.    ED Results / Procedures / Treatments   Labs (all labs ordered are listed, but only abnormal results are displayed) Labs Reviewed  COMPREHENSIVE METABOLIC PANEL - Abnormal; Notable for the following components:      Result Value   Glucose, Bld 103 (*)    Creatinine, Ser 1.30 (*)    GFR calc non Af Amer 43 (*)    GFR calc Af Amer 50 (*)    All other components within normal limits  URINALYSIS, ROUTINE W REFLEX MICROSCOPIC -  Abnormal; Notable for the following components:   Color, Urine AMBER (*)    APPearance HAZY (*)    Hgb urine dipstick MODERATE (*)    Ketones, ur 5 (*)    Protein, ur 30 (*)    Bacteria, UA FEW (*)    All other components within normal limits  LIPASE, BLOOD  CBC   EKG None  Radiology CT ABDOMEN PELVIS W CONTRAST  Result Date: 09/13/2019 CLINICAL DATA:  Abdominal pain. EXAM: CT ABDOMEN AND PELVIS WITH CONTRAST TECHNIQUE: Multidetector CT imaging of the abdomen and pelvis was performed using the standard protocol following bolus administration of intravenous contrast. CONTRAST:  13mL OMNIPAQUE IOHEXOL 300 MG/ML  SOLN COMPARISON:  September 12, 2010 FINDINGS: Lower chest: No acute abnormality. Hepatobiliary: No focal liver abnormality is seen. Numerous tiny gallstones are seen within the lumen of the gallbladder. There is no evidence of gallbladder wall thickening or biliary dilatation. Pancreas: Unremarkable. No pancreatic ductal dilatation or surrounding inflammatory changes. Spleen: Punctate calcified granulomas are seen scattered throughout the spleen. Adrenals/Urinary Tract: Adrenal glands are unremarkable. Kidneys are normal in size. Focal scarring is seen involving the upper pole of the left kidney. A 1.1 cm simple cyst is seen within the posterior aspect of the mid to lower left kidney. 1.9 cm and 2.5 cm right parapelvic renal cysts are seen. There is no evidence of hydronephrosis or renal calculi. Bladder is unremarkable. Stomach/Bowel: Stomach is within normal limits. Appendix appears normal. Noninflamed diverticula are seen throughout the large bowel, with mildly inflamed diverticula is seen within the proximal ascending colon. There is no evidence of associated perforation or abscess. Vascular/Lymphatic: There is moderate severity calcification of the abdominal aorta and bilateral common iliac arteries, without evidence of aneurysmal dilatation. No enlarged abdominal or pelvic lymph nodes.  Reproductive: Uterus and bilateral adnexa are unremarkable. Other: A 28.7 cm x 8.1 cm x 16.7 cm ventral hernia is seen along the anterior pelvic wall. This is markedly increased in size when compared to the prior study and contains numerous small bowel loops. Surgically anastomosed small bowel is also seen within this region. Dilatation of the surgically anastomosed segment is seen (approximately 5.1 cm). Musculoskeletal: Multilevel degenerative changes seen throughout the lumbar spine. IMPRESSION: 1. Markedly increased size of a 28.7 cm x 8.1 cm x 16.7 cm ventral hernia along the anterior pelvic wall, markedly increased in size when compared to the prior study dated September 12, 2010. 2. Dilated loop of surgically anastomosed bowel within the previously noted ventral hernia. While this may be, in part, related to postoperative changes, sequelae associated with a partial small bowel obstruction cannot be excluded. 3. Colonic diverticulosis, with mild diverticulitis involving the proximal ascending colon. 4. Cholelithiasis. 5. Bilateral renal cysts. Aortic Atherosclerosis (ICD10-I70.0). Electronically Signed   By: Virgina Norfolk M.D.   On: 09/13/2019 16:48   Procedures Procedures   Medications Ordered in ED Medications - No data to display  ED Course  I have reviewed the triage vital signs and the nursing notes.  Pertinent labs & imaging results that were available during my care of the patient were reviewed by me and considered in my medical decision making (see chart for details).  Clinical Course as of Sep 12 1898  Sat Sep 13, 2019  1724 Spoke with Dr. Donne Hazel, general surgeon.  States he will come assess the patient.  No need to start any interventions on the patient at this time.   [SJ]  2 Dr. Donne Hazel reviewed the patient's CT and examined the patient.  States the CT and patient's exam is not concerning for small bowel obstruction.  She may be discharged from a surgical point of view.    [SJ]    Clinical Course User Index [SJ] Joy, Helane Gunther, PA-C   MDM Rules/Calculators/A&P  Pt is a 64 y.o. female that presents with a history, physical exam, and ED Clinical Course as noted above.   Pt presented today with three days of waxing and waning abdominal pain overlying an obvious ventral hernia. History of abdominal surgery and small bowel reduction.  Labs today are reassuring. Pt has minimal pain of physical exam, no leukocytosis, is not tachycardic, and is afebrile. CT scan shows worsening ventral hernia with a possible SBO as well as incidental diverticulitis.  It is the end of my shift and pt care is being transferred to [facility], PA-C. Pt made aware of CT scan findings. Our team will discuss with general surgery for further recs.    Note: Portions of this report may have been transcribed using voice recognition software. Every effort was made to ensure accuracy; however, inadvertent computerized transcription errors may be present.    Final Clinical Impression(s) / ED Diagnoses Final diagnoses:  Diverticulitis   Rx / DC Orders ED Discharge Orders    None       Rayna Sexton, PA-C 09/13/19 1904    Hayden Rasmussen, MD 09/13/19 2344

## 2019-09-13 NOTE — Consult Note (Signed)
Reason for Consult:ab pain Referring Physician: Dr Havery Moros is an 63 y.o. female.  HPI: 27 yof with history of vh repair times two with mesh with a  sbr also at some point.  She has recurrence of hernia and has a large abdomen at all times.  She has been having several days of abdominal pain that has been waxing and waning.  She has been tolerating liquids.  Warm compresses have helped. She is passing flatus and having bms she tells me.  No fever. No urinary symptoms.  She had a ct scan that shows hernia recurrence and some possible dilated bowel near anastomosis. I was asked to see her.   Past Medical History:  Diagnosis Date  . Abdominal distention   . Abdominal pain   . Arthritis   . Blood transfusion without reported diagnosis   . Hernia   . Hyperlipidemia   . Hypertension   . Leg swelling   . Strangulated recurrent ventral incisional hernia, s/p small intestine resection 09/22/2010    Past Surgical History:  Procedure Laterality Date  . ABDOMINAL HYSTERECTOMY    . bowel obstruction  09/12/2010  . HERNIA REPAIR  2007   incarcerated, open with biologic mesh  . HERNIA REPAIR  Aug2012   strangulated, redo open with biologic mesh  . SMALL INTESTINE SURGERY  09/12/10   SBR in incarcerated [place]    Family History  Problem Relation Age of Onset  . Heart disease Mother        heart attack   . Heart disease Father        heart attack   . Hypertension Sister     Social History:  reports that she has never smoked. She has never used smokeless tobacco. She reports that she does not drink alcohol and does not use drugs.  Allergies:  Allergies  Allergen Reactions  . Lisinopril Cough    Medications: reviewed  Results for orders placed or performed during the hospital encounter of 09/13/19 (from the past 48 hour(s))  Urinalysis, Routine w reflex microscopic     Status: Abnormal   Collection Time: 09/13/19  7:49 AM  Result Value Ref Range   Color, Urine AMBER (A)  YELLOW    Comment: BIOCHEMICALS MAY BE AFFECTED BY COLOR   APPearance HAZY (A) CLEAR   Specific Gravity, Urine 1.025 1.005 - 1.030   pH 5.0 5.0 - 8.0   Glucose, UA NEGATIVE NEGATIVE mg/dL   Hgb urine dipstick MODERATE (A) NEGATIVE   Bilirubin Urine NEGATIVE NEGATIVE   Ketones, ur 5 (A) NEGATIVE mg/dL   Protein, ur 30 (A) NEGATIVE mg/dL   Nitrite NEGATIVE NEGATIVE   Leukocytes,Ua NEGATIVE NEGATIVE   RBC / HPF 0-5 0 - 5 RBC/hpf   WBC, UA 6-10 0 - 5 WBC/hpf   Bacteria, UA FEW (A) NONE SEEN   Squamous Epithelial / LPF 11-20 0 - 5   Mucus PRESENT    Hyaline Casts, UA PRESENT     Comment: Performed at [facility], [address], [place], [place] [address]  Lipase, blood     Status: None   Collection Time: 09/13/19  7:55 AM  Result Value Ref Range   Lipase 21 11 - 51 U/L    Comment: Performed at [facility] [address], [place], [place] [address]  Comprehensive metabolic panel     Status: Abnormal   Collection Time: 09/13/19  7:55 AM  Result Value Ref Range   Sodium  140 135 - 145 mmol/L   Potassium 4.1 3.5 - 5.1 mmol/L   Chloride 104 98 - 111 mmol/L   CO2 23 22 - 32 mmol/L   Glucose, Bld 103 (H) 70 - 99 mg/dL    Comment: Glucose reference range applies only to samples taken after fasting for at least 8 hours.   BUN 18 8 - 23 mg/dL   Creatinine, Ser 1.30 (H) 0.44 - 1.00 mg/dL   Calcium 9.5 8.9 - 10.3 mg/dL   Total Protein 7.2 6.5 - 8.1 g/dL   Albumin 3.5 3.5 - 5.0 g/dL   AST 16 15 - 41 U/L   ALT 14 0 - 44 U/L   Alkaline Phosphatase 61 38 - 126 U/L   Total Bilirubin 0.6 0.3 - 1.2 mg/dL   GFR calc non Af Amer 43 (L) >60 mL/min   GFR calc Af Amer 50 (L) >60 mL/min   Anion gap 13 5 - 15    Comment: Performed at [facility] [address], [place] [address]  CBC     Status: None   Collection Time: 09/13/19  7:55 AM  Result Value Ref Range   WBC 8.3 4.0 - 10.5 K/uL   RBC 4.51 3.87 - 5.11 MIL/uL   Hemoglobin 12.9 12.0 - 15.0 g/dL   HCT 40.6 36  - 46 %   MCV 90.0 80.0 - 100.0 fL   MCH 28.6 26.0 - 34.0 pg   MCHC 31.8 30.0 - 36.0 g/dL   RDW 15.4 11.5 - 15.5 %   Platelets 255 150 - 400 K/uL   nRBC 0.0 0.0 - 0.2 %    Comment: Performed at [facility], [place] [address] [place], [place] [address]    CT ABDOMEN PELVIS W CONTRAST  Result Date: 09/13/2019 CLINICAL DATA:  Abdominal pain. EXAM: CT ABDOMEN AND PELVIS WITH CONTRAST TECHNIQUE: Multidetector CT imaging of the abdomen and pelvis was performed using the standard protocol following bolus administration of intravenous contrast. CONTRAST:  83mL OMNIPAQUE IOHEXOL 300 MG/ML  SOLN COMPARISON:  September 12, 2010 FINDINGS: Lower chest: No acute abnormality. Hepatobiliary: No focal liver abnormality is seen. Numerous tiny gallstones are seen within the lumen of the gallbladder. There is no evidence of gallbladder wall thickening or biliary dilatation. Pancreas: Unremarkable. No pancreatic ductal dilatation or surrounding inflammatory changes. Spleen: Punctate calcified granulomas are seen scattered throughout the spleen. Adrenals/Urinary Tract: Adrenal glands are unremarkable. Kidneys are normal in size. Focal scarring is seen involving the upper pole of the left kidney. A 1.1 cm simple cyst is seen within the posterior aspect of the mid to lower left kidney. 1.9 cm and 2.5 cm right parapelvic renal cysts are seen. There is no evidence of hydronephrosis or renal calculi. Bladder is unremarkable. Stomach/Bowel: Stomach is within normal limits. Appendix appears normal. Noninflamed diverticula are seen throughout the large bowel, with mildly inflamed diverticula is seen within the proximal ascending colon. There is no evidence of associated perforation or abscess. Vascular/Lymphatic: There is moderate severity calcification of the abdominal aorta and bilateral common iliac arteries, without evidence of aneurysmal dilatation. No enlarged abdominal or pelvic lymph nodes. Reproductive: Uterus and bilateral  adnexa are unremarkable. Other: A 28.7 cm x 8.1 cm x 16.7 cm ventral hernia is seen along the anterior pelvic wall. This is markedly increased in size when compared to the prior study and contains numerous small bowel loops. Surgically anastomosed small bowel is also seen within this region. Dilatation of the  surgically anastomosed segment is seen (approximately 5.1 cm). Musculoskeletal: Multilevel degenerative changes seen throughout the lumbar spine. IMPRESSION: 1. Markedly increased size of a 28.7 cm x 8.1 cm x 16.7 cm ventral hernia along the anterior pelvic wall, markedly increased in size when compared to the prior study dated September 12, 2010. 2. Dilated loop of surgically anastomosed bowel within the previously noted ventral hernia. While this may be, in part, related to postoperative changes, sequelae associated with a partial small bowel obstruction cannot be excluded. 3. Colonic diverticulosis, with mild diverticulitis involving the proximal ascending colon. 4. Cholelithiasis. 5. Bilateral renal cysts. Aortic Atherosclerosis (ICD10-I70.0). Electronically Signed   By: Virgina Norfolk M.D.   On: 09/13/2019 16:48    Review of Systems  Gastrointestinal: Positive for abdominal pain.  All other systems reviewed and are negative.  Blood pressure 133/83, pulse 82, temperature 98.5 F (36.9 C), temperature source Oral, resp. rate 16, SpO2 100 %. Physical Exam Constitutional:      Appearance: She is well-developed. She is obese.  Eyes:     General: No scleral icterus. Cardiovascular:     Rate and Rhythm: Normal rate and regular rhythm.  Pulmonary:     Effort: Pulmonary effort is normal.     Breath sounds: Normal breath sounds.  Abdominal:     General: Bowel sounds are normal. There is no distension.     Palpations: Abdomen is soft.     Tenderness: There is no abdominal tenderness.     Hernia: A hernia is present. Hernia is present in the ventral area.     Comments: Incision healed, large  chronically incarcerated vh, nontender  Skin:    General: Skin is warm and dry.     Capillary Refill: Capillary refill takes less than 2 seconds.  Neurological:     General: No focal deficit present.     Mental Status: She is alert.  Psychiatric:        Mood and Affect: Mood normal.        Behavior: Behavior normal.     Assessment/Plan: Abdominal pain -no surgical indication. Nothing I am really concerned about on exam or ct scan. Nl vitals, nl wbc, if she can tolerate liquids I am fine discharging her to advance diet as tolerated at home.  -discussed reasons to return to the ER  Rolm Bookbinder 09/13/2019, 6:42 PM

## 2019-09-13 NOTE — ED Provider Notes (Signed)
[name] is a 64 y.o. female, with a history of ventral hernia, hyperlipidemia, HTN, presenting to the ED with abdominal pain for the past 3 days.  Pain is in the central abdomen, difficult to describe, waxing and waning. She continues to pass gas without difficulty.   HPI from Rayna Sexton, PA-C: "Patient is a 64 year old female with a history of recurrent central incisional hernia, hypertension, abdominal pain, abdominal distention who presents due to 3 days of abdominal pain. Patient reports a ventral hernia in the region and states that her abdominal pain began about 3 days ago. Her pain waxes and wanes and seems to improve with massaging the region as well as applying warm compresses. She took two antacids last night and states that she experienced an episode of nausea and vomiting but denies any current nausea. No hematemesis. Patient notes little p.o. intake the past 3 days because "she was nervous it would make her symptoms worse". Her current pain is 5/10. Her most recent BM was 2 days ago and states that it was normal. No melena or hematochezia. She has no other physical complaints at this time. No fevers, chills, chest pain, shortness of breath, urinary changes, syncope."  Past Medical History:  Diagnosis Date  . Abdominal distention   . Abdominal pain   . Arthritis   . Blood transfusion without reported diagnosis   . Hernia   . Hyperlipidemia   . Hypertension   . Leg swelling   . Strangulated recurrent ventral incisional hernia, s/p small intestine resection 09/22/2010    Physical Exam  BP 133/83 (BP Location: Right Arm)   Pulse 82   Temp 98.5 F (36.9 C) (Oral)   Resp 16   SpO2 100%   Physical Exam Vitals and nursing note reviewed.  Constitutional:      General: She is not in acute distress.    Appearance: She is well-developed. She is obese. She is not diaphoretic.  HENT:     Head: Normocephalic and atraumatic.     Mouth/Throat:     Mouth: Mucous membranes  are moist.     Pharynx: Oropharynx is clear.  Eyes:     Conjunctiva/sclera: Conjunctivae normal.  Cardiovascular:     Rate and Rhythm: Normal rate and regular rhythm.     Pulses: Normal pulses.          Radial pulses are 2+ on the right side and 2+ on the left side.       Posterior tibial pulses are 2+ on the right side and 2+ on the left side.     Heart sounds: Normal heart sounds.     Comments: Tactile temperature in the extremities appropriate and equal bilaterally. Pulmonary:     Effort: Pulmonary effort is normal. No respiratory distress.     Breath sounds: Normal breath sounds.  Abdominal:     Palpations: Abdomen is soft.     Tenderness: There is abdominal tenderness. There is no guarding.       Comments: Seemingly mild tenderness to the central abdomen.  Definite ventral hernia noted with no difficulties with reduction.  Musculoskeletal:     Cervical back: Neck supple.     Right lower leg: No edema.     Left lower leg: No edema.  Lymphadenopathy:     Cervical: No cervical adenopathy.  Skin:    General: Skin is warm and dry.  Neurological:     Mental Status: She is alert.  Psychiatric:  Mood and Affect: Mood and affect normal.        Speech: Speech normal.        Behavior: Behavior normal.     ED Course/Procedures     Procedures   Abnormal Labs Reviewed  COMPREHENSIVE METABOLIC PANEL - Abnormal; Notable for the following components:      Result Value   Glucose, Bld 103 (*)    Creatinine, Ser 1.30 (*)    GFR calc non Af Amer 43 (*)    GFR calc Af Amer 50 (*)    All other components within normal limits  URINALYSIS, ROUTINE W REFLEX MICROSCOPIC - Abnormal; Notable for the following components:   Color, Urine AMBER (*)    APPearance HAZY (*)    Hgb urine dipstick MODERATE (*)    Ketones, ur 5 (*)    Protein, ur 30 (*)    Bacteria, UA FEW (*)    All other components within normal limits   CT ABDOMEN PELVIS W CONTRAST  Result Date:  09/13/2019 CLINICAL DATA:  Abdominal pain. EXAM: CT ABDOMEN AND PELVIS WITH CONTRAST TECHNIQUE: Multidetector CT imaging of the abdomen and pelvis was performed using the standard protocol following bolus administration of intravenous contrast. CONTRAST:  25mL OMNIPAQUE IOHEXOL 300 MG/ML  SOLN COMPARISON:  September 12, 2010 FINDINGS: Lower chest: No acute abnormality. Hepatobiliary: No focal liver abnormality is seen. Numerous tiny gallstones are seen within the lumen of the gallbladder. There is no evidence of gallbladder wall thickening or biliary dilatation. Pancreas: Unremarkable. No pancreatic ductal dilatation or surrounding inflammatory changes. Spleen: Punctate calcified granulomas are seen scattered throughout the spleen. Adrenals/Urinary Tract: Adrenal glands are unremarkable. Kidneys are normal in size. Focal scarring is seen involving the upper pole of the left kidney. A 1.1 cm simple cyst is seen within the posterior aspect of the mid to lower left kidney. 1.9 cm and 2.5 cm right parapelvic renal cysts are seen. There is no evidence of hydronephrosis or renal calculi. Bladder is unremarkable. Stomach/Bowel: Stomach is within normal limits. Appendix appears normal. Noninflamed diverticula are seen throughout the large bowel, with mildly inflamed diverticula is seen within the proximal ascending colon. There is no evidence of associated perforation or abscess. Vascular/Lymphatic: There is moderate severity calcification of the abdominal aorta and bilateral common iliac arteries, without evidence of aneurysmal dilatation. No enlarged abdominal or pelvic lymph nodes. Reproductive: Uterus and bilateral adnexa are unremarkable. Other: A 28.7 cm x 8.1 cm x 16.7 cm ventral hernia is seen along the anterior pelvic wall. This is markedly increased in size when compared to the prior study and contains numerous small bowel loops. Surgically anastomosed small bowel is also seen within this region. Dilatation of the  surgically anastomosed segment is seen (approximately 5.1 cm). Musculoskeletal: Multilevel degenerative changes seen throughout the lumbar spine. IMPRESSION: 1. Markedly increased size of a 28.7 cm x 8.1 cm x 16.7 cm ventral hernia along the anterior pelvic wall, markedly increased in size when compared to the prior study dated September 12, 2010. 2. Dilated loop of surgically anastomosed bowel within the previously noted ventral hernia. While this may be, in part, related to postoperative changes, sequelae associated with a partial small bowel obstruction cannot be excluded. 3. Colonic diverticulosis, with mild diverticulitis involving the proximal ascending colon. 4. Cholelithiasis. 5. Bilateral renal cysts. Aortic Atherosclerosis (ICD10-I70.0). Electronically Signed   By: Virgina Norfolk M.D.   On: 09/13/2019 16:48     MDM   Clinical Course as of Sep 13 17  Sat Sep 13, 2019  1724 Spoke with Dr. Donne Hazel, general surgeon.  States he will come assess the patient.  No need to start any interventions on the patient at this time.   [SJ]  4 Dr. Donne Hazel reviewed the patient's CT and examined the patient.  States the CT and patient's exam is not concerning for small bowel obstruction.  She may be discharged from a surgical point of view.   [SJ]    Clinical Course User Index [SJ] Tymere Depuy, Maceo Pro   Took patient care handoff report from [facility], [place]. Plan: Contact general surgery due to question of SBO on CT.   Patient presents for intermittent abdominal pain. Patient is nontoxic appearing, afebrile, not tachycardic, not tachypneic, not hypotensive, maintains excellent SPO2 on room air, and is in no apparent distress.   I have reviewed the patient's chart to obtain more information.   I reviewed and interpreted the patient's labs and radiological studies. Lab results reassuring. CT with large ventral hernia.  She will follow-up in the general surgery office for this issue.   Diverticulitis was also noted.  This will be addressed with antibiotics and GI follow-up. Cleared by general surgery for discharge.  The patient was given instructions for home care as well as return precautions. Patient voices understanding of these instructions, accepts the plan, and is comfortable with discharge.   Vitals:   09/13/19 0735 09/13/19 0953 09/13/19 1225 09/13/19 1454  BP: (!) 142/81 126/80 107/75 133/83  Pulse: 69 83 83 82  Resp: 18 16 18 16   Temp: 98.2 F (36.8 C)   98.5 F (36.9 C)  TempSrc: Oral   Oral  SpO2: 100% 100% 100% 100%   Vitals:   09/13/19 0953 09/13/19 1225 09/13/19 1454 09/13/19 1908  BP: 126/80 107/75 133/83 125/79  Pulse: 83 83 82 89  Resp: 16 18 16 16   Temp:   98.5 F (36.9 C) 97.9 F (36.6 C)  TempSrc:   Oral Oral  SpO2: 100% 100% 100% 96%      Layla Maw 09/14/19 0021    Hayden Rasmussen, MD 09/14/19 [phone]

## 2019-09-13 NOTE — Discharge Instructions (Addendum)
Abdominal pain and diverticulitis  Hand washing: Wash your hands throughout the day, but especially before and after touching the face, using the restroom, sneezing, coughing, or touching surfaces that have been coughed or sneezed upon. Hydration: Symptoms will be intensified and complicated by dehydration. Dehydration can also extend the duration of symptoms. Drink plenty of fluids and get plenty of rest. You should be drinking at least half a liter of water an hour to stay hydrated. Electrolyte drinks (ex. Gatorade, Powerade, Pedialyte) are also encouraged. You should be drinking enough fluids to make your urine light yellow, almost clear. If this is not the case, you are not drinking enough water. Please note that some of the treatments indicated below will not be effective if you are not adequately hydrated. Diet: Please concentrate on hydration, however, you may introduce food slowly.  Start with a clear liquid diet, progressed to a full liquid diet, and then bland solids as you are able. Pain or fever: Ibuprofen, Naproxen, or Tylenol for pain or fever.  Nausea/vomiting: Use the ondansetron (generic for Zofran) for nausea or vomiting.  This medication may not prevent all vomiting or nausea, but can help facilitate better hydration. Things that can help with nausea/vomiting also include peppermint/menthol candies, vitamin B12, and ginger. Diarrhea: May use medications such as loperamide (Imodium) or Bismuth subsalicylate (Pepto-Bismol). Please take all of your antibiotics until finished!   You may develop abdominal discomfort or diarrhea from the antibiotic.  You may help offset this with probiotics which you can buy or get in yogurt. Do not eat or take the probiotics until 2 hours after your antibiotic.  Follow-up: Follow-up with the gastroenterologist for follow-up on the diverticulitis.  Follow-up with the general surgeon regarding your hernia.  Call to make these appointments. Return: Return  should you develop a fever, bloody diarrhea, increased abdominal pain, uncontrolled vomiting, or any other major concerns.  For prescription assistance, may try using prescription discount sites or apps, such as goodrx.com

## 2019-09-13 NOTE — ED Triage Notes (Signed)
Pt. Stated, ive had stomach pain since Wednesday. No other symptoms.

## 2019-09-13 NOTE — ED Notes (Signed)
Pt to CT at this time.

## 2019-09-22 ENCOUNTER — Other Ambulatory Visit (INDEPENDENT_AMBULATORY_CARE_PROVIDER_SITE_OTHER): Payer: Self-pay | Admitting: Physician Assistant

## 2019-12-04 ENCOUNTER — Ambulatory Visit: Payer: BC Managed Care – PPO

## 2019-12-04 ENCOUNTER — Other Ambulatory Visit: Payer: BC Managed Care – PPO

## 2019-12-09 ENCOUNTER — Other Ambulatory Visit: Payer: Self-pay

## 2019-12-09 ENCOUNTER — Ambulatory Visit
Admission: RE | Admit: 2019-12-09 | Discharge: 2019-12-09 | Disposition: A | Discharge status: Home / Self Care | Payer: BC Managed Care – PPO | Source: Ambulatory Visit | Attending: Physician Assistant | Admitting: Physician Assistant

## 2019-12-09 DIAGNOSIS — Z1231 Encounter for screening mammogram for malignant neoplasm of breast: Secondary | ICD-10-CM

## 2020-03-04 ENCOUNTER — Other Ambulatory Visit: Payer: BC Managed Care – PPO

## 2020-07-29 ENCOUNTER — Other Ambulatory Visit: Payer: Self-pay | Admitting: Physician Assistant

## 2020-07-29 DIAGNOSIS — Z13820 Encounter for screening for osteoporosis: Secondary | ICD-10-CM

## 2020-08-02 ENCOUNTER — Other Ambulatory Visit: Payer: Medicare Other

## 2020-10-19 ENCOUNTER — Other Ambulatory Visit: Payer: Self-pay | Admitting: Physician Assistant

## 2020-10-19 DIAGNOSIS — Z1231 Encounter for screening mammogram for malignant neoplasm of breast: Secondary | ICD-10-CM

## 2020-12-13 ENCOUNTER — Ambulatory Visit
Admission: RE | Admit: 2020-12-13 | Discharge: 2020-12-13 | Disposition: A | Discharge status: Home / Self Care | Payer: Medicare Other | Source: Ambulatory Visit | Attending: Physician Assistant | Admitting: Physician Assistant

## 2020-12-13 ENCOUNTER — Other Ambulatory Visit: Payer: Self-pay

## 2020-12-13 DIAGNOSIS — Z1231 Encounter for screening mammogram for malignant neoplasm of breast: Secondary | ICD-10-CM

## 2021-01-11 ENCOUNTER — Inpatient Hospital Stay: Admission: RE | Admit: 2021-01-11 | Payer: Medicare Other | Source: Ambulatory Visit

## 2021-05-30 IMAGING — MG DIGITAL SCREENING BILAT W/ TOMO W/ CAD
6 of 10 series · 6 of 30 positions shown · non-contrast
Comparison: Previous exam(s).

CLINICAL DATA: Screening.

EXAM:
DIGITAL SCREENING BILATERAL MAMMOGRAM WITH TOMO AND CAD

[L CC synth-2D]
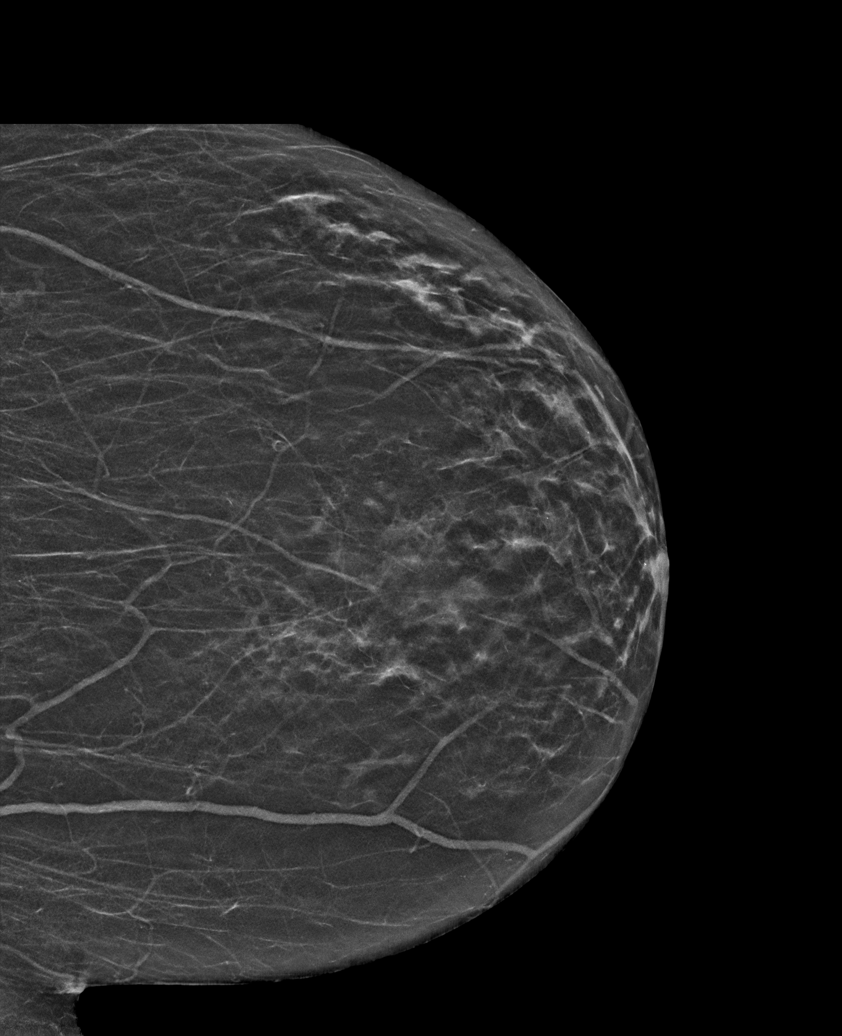

[L MLO synth-2D]
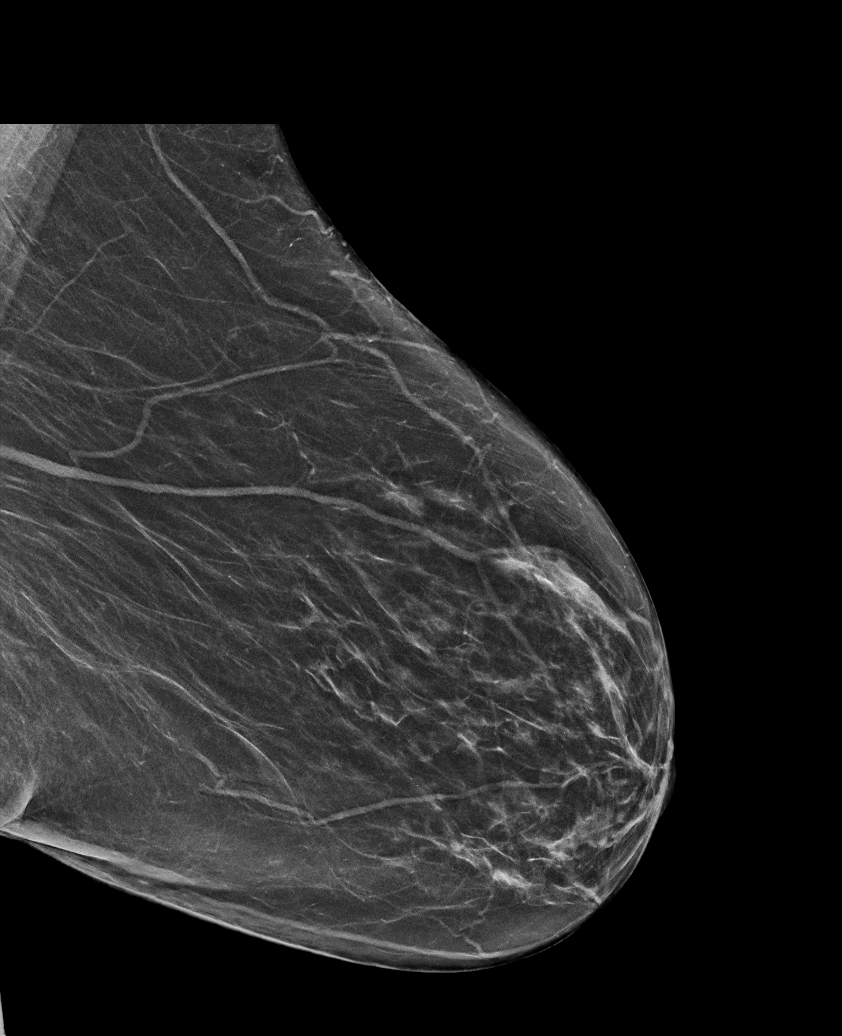

[R CC synth-2D]
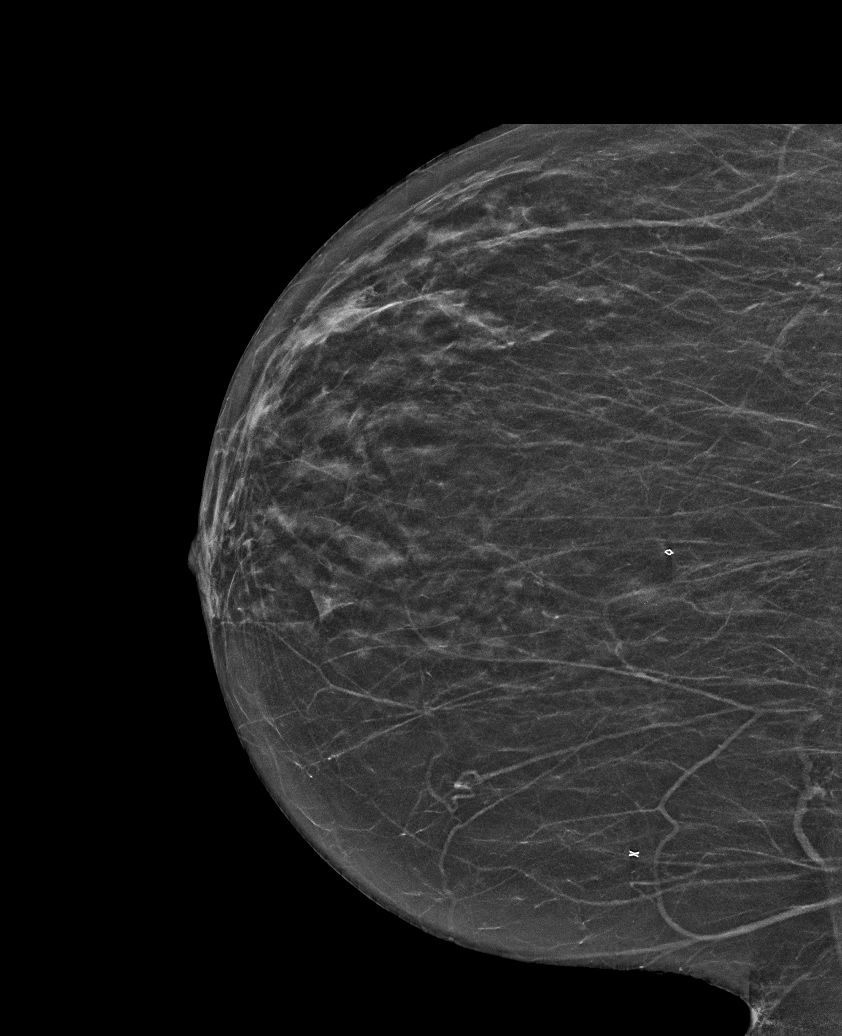

[R MLO synth-2D (1 of 2)]
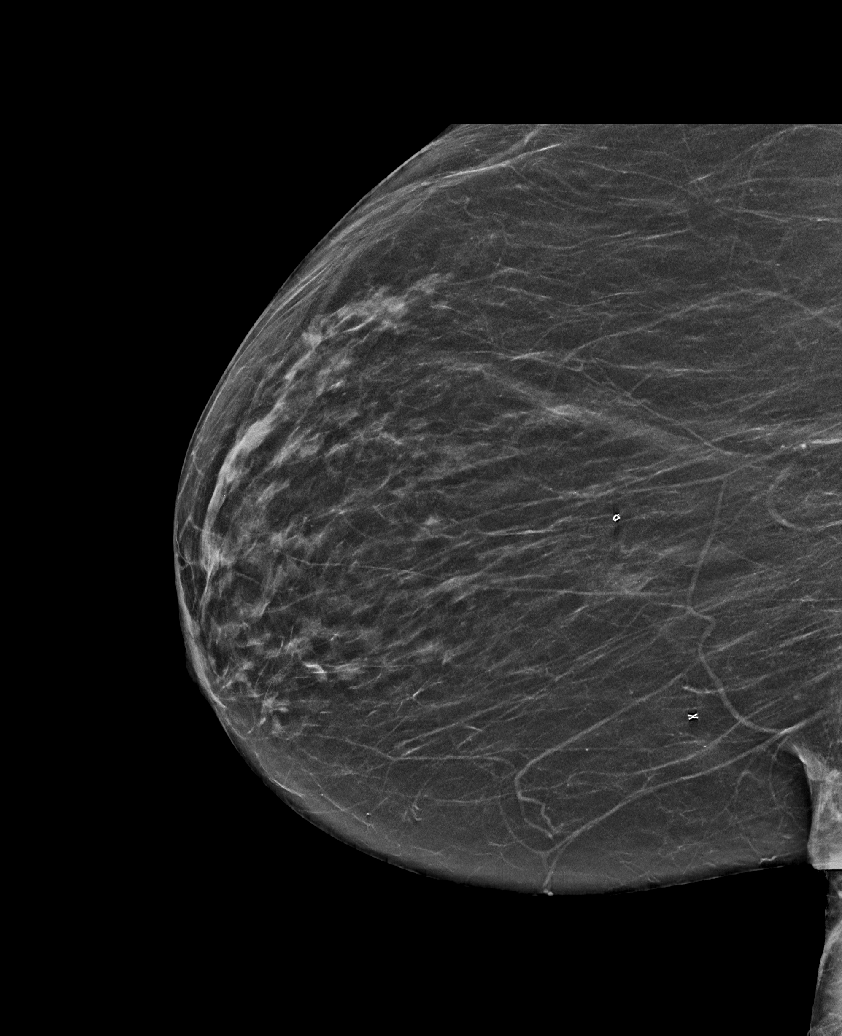

[R MLO synth-2D (2 of 2)]
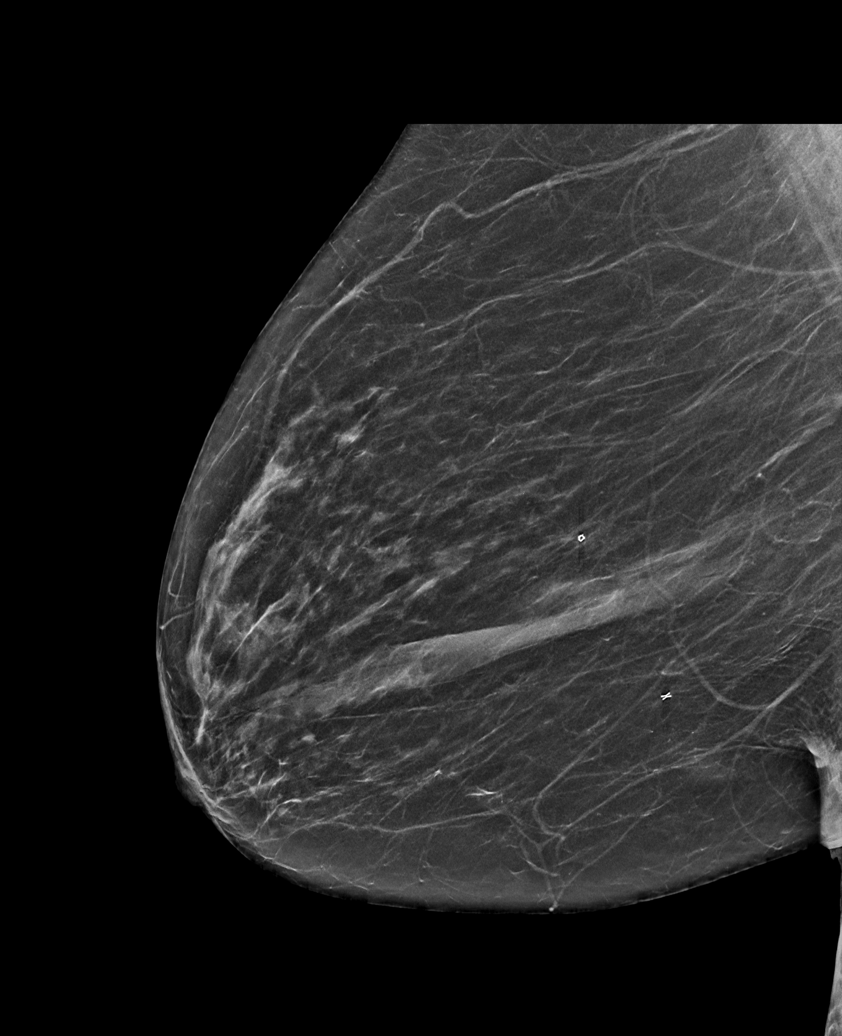

[R MLO tomo · tomo slice 29/57.0]
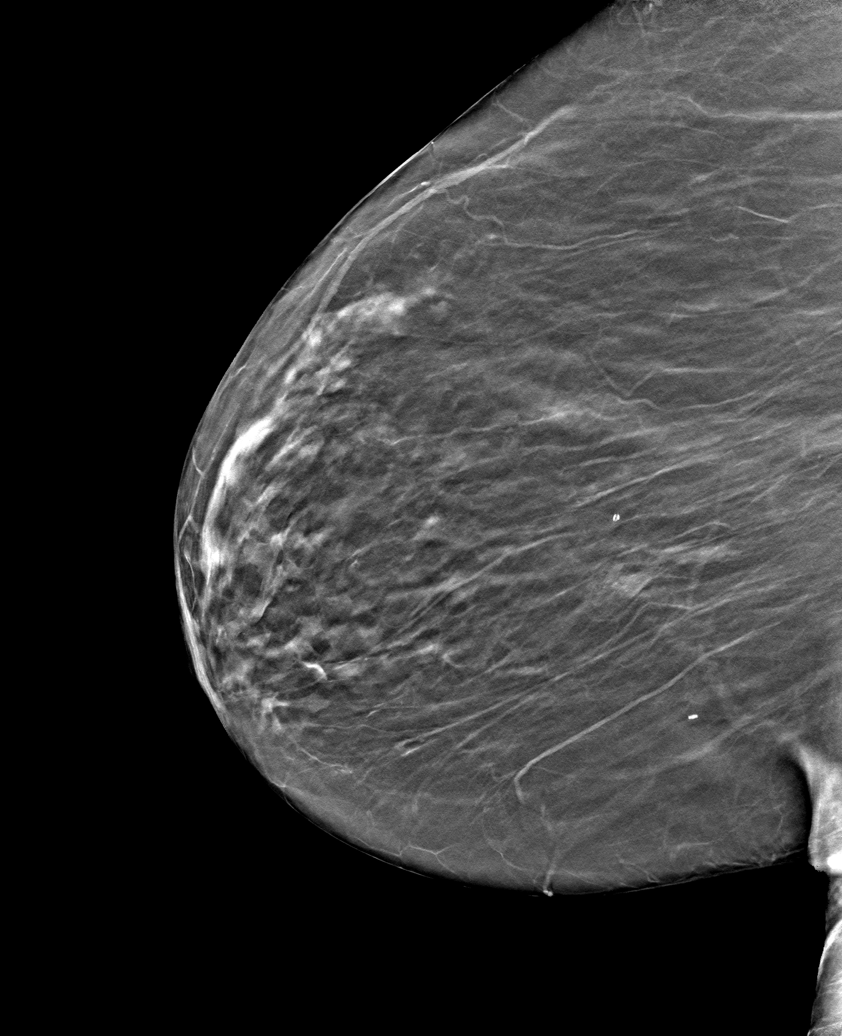

[6 of 30 positions shown; findings below may reference images not displayed]

ACR Breast Density Category b: There are scattered areas of
fibroglandular density.
FINDINGS: There are no findings suspicious for malignancy. Images were
processed with CAD.
IMPRESSION: No mammographic evidence of malignancy. A result letter of this
screening mammogram will be mailed directly to the patient.

RECOMMENDATION:
Screening mammogram in one year. (Code:[id number])

BI-RADS CATEGORY  1: Negative.

## 2021-07-21 ENCOUNTER — Emergency Department (HOSPITAL_COMMUNITY): Payer: BC Managed Care – PPO

## 2021-07-21 ENCOUNTER — Other Ambulatory Visit: Payer: Self-pay

## 2021-07-21 ENCOUNTER — Inpatient Hospital Stay (HOSPITAL_COMMUNITY)
Admission: EM | Admit: 2021-07-21 | Discharge: 2021-08-16 | DRG: 853 | Disposition: A | Discharge status: Home Health | Payer: BC Managed Care – PPO | Attending: Internal Medicine | Admitting: Internal Medicine

## 2021-07-21 ENCOUNTER — Encounter (HOSPITAL_COMMUNITY): Payer: Self-pay

## 2021-07-21 DIAGNOSIS — Z992 Dependence on renal dialysis: Secondary | ICD-10-CM

## 2021-07-21 DIAGNOSIS — N1832 Chronic kidney disease, stage 3b: Secondary | ICD-10-CM | POA: Diagnosis present

## 2021-07-21 DIAGNOSIS — I10 Essential (primary) hypertension: Secondary | ICD-10-CM | POA: Diagnosis present

## 2021-07-21 DIAGNOSIS — R140 Abdominal distension (gaseous): Secondary | ICD-10-CM | POA: Diagnosis present

## 2021-07-21 DIAGNOSIS — D72829 Elevated white blood cell count, unspecified: Secondary | ICD-10-CM | POA: Diagnosis present

## 2021-07-21 DIAGNOSIS — Z8719 Personal history of other diseases of the digestive system: Secondary | ICD-10-CM

## 2021-07-21 DIAGNOSIS — Y712 Prosthetic and other implants, materials and accessory cardiovascular devices associated with adverse incidents: Secondary | ICD-10-CM | POA: Diagnosis not present

## 2021-07-21 DIAGNOSIS — D6489 Other specified anemias: Secondary | ICD-10-CM | POA: Diagnosis not present

## 2021-07-21 DIAGNOSIS — R6521 Severe sepsis with septic shock: Secondary | ICD-10-CM | POA: Diagnosis present

## 2021-07-21 DIAGNOSIS — K430 Incisional hernia with obstruction, without gangrene: Secondary | ICD-10-CM | POA: Diagnosis present

## 2021-07-21 DIAGNOSIS — K55019 Acute (reversible) ischemia of small intestine, extent unspecified: Secondary | ICD-10-CM | POA: Diagnosis present

## 2021-07-21 DIAGNOSIS — M1990 Unspecified osteoarthritis, unspecified site: Secondary | ICD-10-CM | POA: Diagnosis present

## 2021-07-21 DIAGNOSIS — L304 Erythema intertrigo: Secondary | ICD-10-CM | POA: Diagnosis present

## 2021-07-21 DIAGNOSIS — Z803 Family history of malignant neoplasm of breast: Secondary | ICD-10-CM

## 2021-07-21 DIAGNOSIS — R6510 Systemic inflammatory response syndrome (SIRS) of non-infectious origin without acute organ dysfunction: Secondary | ICD-10-CM | POA: Diagnosis present

## 2021-07-21 DIAGNOSIS — N179 Acute kidney failure, unspecified: Principal | ICD-10-CM

## 2021-07-21 DIAGNOSIS — I96 Gangrene, not elsewhere classified: Secondary | ICD-10-CM | POA: Diagnosis present

## 2021-07-21 DIAGNOSIS — N1831 Chronic kidney disease, stage 3a: Secondary | ICD-10-CM | POA: Diagnosis present

## 2021-07-21 DIAGNOSIS — R571 Hypovolemic shock: Secondary | ICD-10-CM | POA: Diagnosis not present

## 2021-07-21 DIAGNOSIS — R21 Rash and other nonspecific skin eruption: Secondary | ICD-10-CM | POA: Diagnosis present

## 2021-07-21 DIAGNOSIS — Z8249 Family history of ischemic heart disease and other diseases of the circulatory system: Secondary | ICD-10-CM

## 2021-07-21 DIAGNOSIS — E876 Hypokalemia: Secondary | ICD-10-CM | POA: Diagnosis present

## 2021-07-21 DIAGNOSIS — Z888 Allergy status to other drugs, medicaments and biological substances status: Secondary | ICD-10-CM

## 2021-07-21 DIAGNOSIS — Z9889 Other specified postprocedural states: Secondary | ICD-10-CM

## 2021-07-21 DIAGNOSIS — R000 Tachycardia, unspecified: Secondary | ICD-10-CM | POA: Diagnosis present

## 2021-07-21 DIAGNOSIS — E785 Hyperlipidemia, unspecified: Secondary | ICD-10-CM | POA: Diagnosis present

## 2021-07-21 DIAGNOSIS — T8241XA Breakdown (mechanical) of vascular dialysis catheter, initial encounter: Secondary | ICD-10-CM | POA: Diagnosis not present

## 2021-07-21 DIAGNOSIS — R5383 Other fatigue: Secondary | ICD-10-CM | POA: Diagnosis present

## 2021-07-21 DIAGNOSIS — I959 Hypotension, unspecified: Secondary | ICD-10-CM | POA: Diagnosis present

## 2021-07-21 DIAGNOSIS — K56609 Unspecified intestinal obstruction, unspecified as to partial versus complete obstruction: Secondary | ICD-10-CM

## 2021-07-21 DIAGNOSIS — K659 Peritonitis, unspecified: Secondary | ICD-10-CM | POA: Diagnosis present

## 2021-07-21 DIAGNOSIS — E860 Dehydration: Secondary | ICD-10-CM | POA: Diagnosis present

## 2021-07-21 DIAGNOSIS — K551 Chronic vascular disorders of intestine: Secondary | ICD-10-CM | POA: Diagnosis present

## 2021-07-21 DIAGNOSIS — A419 Sepsis, unspecified organism: Principal | ICD-10-CM | POA: Diagnosis present

## 2021-07-21 DIAGNOSIS — R34 Anuria and oliguria: Secondary | ICD-10-CM | POA: Diagnosis not present

## 2021-07-21 DIAGNOSIS — I248 Other forms of acute ischemic heart disease: Secondary | ICD-10-CM | POA: Diagnosis present

## 2021-07-21 DIAGNOSIS — K55029 Acute infarction of small intestine, extent unspecified: Secondary | ICD-10-CM | POA: Diagnosis present

## 2021-07-21 DIAGNOSIS — M7989 Other specified soft tissue disorders: Secondary | ICD-10-CM | POA: Diagnosis present

## 2021-07-21 DIAGNOSIS — Z79899 Other long term (current) drug therapy: Secondary | ICD-10-CM

## 2021-07-21 DIAGNOSIS — K660 Peritoneal adhesions (postprocedural) (postinfection): Secondary | ICD-10-CM | POA: Diagnosis present

## 2021-07-21 DIAGNOSIS — E872 Acidosis, unspecified: Secondary | ICD-10-CM | POA: Diagnosis not present

## 2021-07-21 DIAGNOSIS — K912 Postsurgical malabsorption, not elsewhere classified: Secondary | ICD-10-CM | POA: Diagnosis not present

## 2021-07-21 DIAGNOSIS — R5381 Other malaise: Secondary | ICD-10-CM | POA: Diagnosis not present

## 2021-07-21 DIAGNOSIS — N170 Acute kidney failure with tubular necrosis: Secondary | ICD-10-CM | POA: Diagnosis present

## 2021-07-21 DIAGNOSIS — E43 Unspecified severe protein-calorie malnutrition: Secondary | ICD-10-CM | POA: Diagnosis not present

## 2021-07-21 DIAGNOSIS — Z9911 Dependence on respirator [ventilator] status: Secondary | ICD-10-CM

## 2021-07-21 DIAGNOSIS — Z6841 Body Mass Index (BMI) 40.0 and over, adult: Secondary | ICD-10-CM

## 2021-07-21 DIAGNOSIS — I120 Hypertensive chronic kidney disease with stage 5 chronic kidney disease or end stage renal disease: Secondary | ICD-10-CM | POA: Diagnosis present

## 2021-07-21 DIAGNOSIS — J95821 Acute postprocedural respiratory failure: Secondary | ICD-10-CM | POA: Diagnosis not present

## 2021-07-21 DIAGNOSIS — D631 Anemia in chronic kidney disease: Secondary | ICD-10-CM | POA: Diagnosis present

## 2021-07-21 DIAGNOSIS — K436 Other and unspecified ventral hernia with obstruction, without gangrene: Secondary | ICD-10-CM | POA: Diagnosis present

## 2021-07-21 LAB — CBC WITH DIFFERENTIAL/PLATELET
Abs Immature Granulocytes: 0.23 10*3/uL — ABNORMAL HIGH (ref 0.00–0.07)
Basophils Absolute: 0.1 10*3/uL (ref 0.0–0.1)
Basophils Relative: 0 %
Eosinophils Absolute: 0 10*3/uL (ref 0.0–0.5)
Eosinophils Relative: 0 %
HCT: 40.8 % (ref 36.0–46.0)
Hemoglobin: 13.2 g/dL (ref 12.0–15.0)
Immature Granulocytes: 2 %
Lymphocytes Relative: 11 %
Lymphs Abs: 1.3 10*3/uL (ref 0.7–4.0)
MCH: 31.3 pg (ref 26.0–34.0)
MCHC: 32.4 g/dL (ref 30.0–36.0)
MCV: 96.7 fL (ref 80.0–100.0)
Monocytes Absolute: 1 10*3/uL (ref 0.1–1.0)
Monocytes Relative: 8 %
Neutro Abs: 9 10*3/uL — ABNORMAL HIGH (ref 1.7–7.7)
Neutrophils Relative %: 79 %
Platelets: 272 10*3/uL (ref 150–400)
RBC: 4.22 MIL/uL (ref 3.87–5.11)
RDW: 18.4 % — ABNORMAL HIGH (ref 11.5–15.5)
WBC: 11.6 10*3/uL — ABNORMAL HIGH (ref 4.0–10.5)
nRBC: 0.3 % — ABNORMAL HIGH (ref 0.0–0.2)

## 2021-07-21 LAB — COMPREHENSIVE METABOLIC PANEL
ALT: 17 U/L (ref 0–44)
AST: 27 U/L (ref 15–41)
Albumin: 2.7 g/dL — ABNORMAL LOW (ref 3.5–5.0)
Alkaline Phosphatase: 56 U/L (ref 38–126)
Anion gap: 22 — ABNORMAL HIGH (ref 5–15)
BUN: 74 mg/dL — ABNORMAL HIGH (ref 8–23)
CO2: 27 mmol/L (ref 22–32)
Calcium: 8.7 mg/dL — ABNORMAL LOW (ref 8.9–10.3)
Chloride: 94 mmol/L — ABNORMAL LOW (ref 98–111)
Creatinine, Ser: 5.28 mg/dL — ABNORMAL HIGH (ref 0.44–1.00)
GFR, Estimated: 8 mL/min — ABNORMAL LOW (ref >60)
Glucose, Bld: 102 mg/dL — ABNORMAL HIGH (ref 70–99)
Potassium: 2.9 mmol/L — ABNORMAL LOW (ref 3.5–5.1)
Sodium: 143 mmol/L (ref 135–145)
Total Bilirubin: 0.6 mg/dL (ref 0.3–1.2)
Total Protein: 6 g/dL — ABNORMAL LOW (ref 6.5–8.1)

## 2021-07-21 LAB — LACTIC ACID, PLASMA: Lactic Acid, Venous: 6.5 mmol/L (ref 0.5–1.9)

## 2021-07-21 LAB — MAGNESIUM: Magnesium: 2.1 mg/dL (ref 1.7–2.4)

## 2021-07-21 LAB — LIPASE, BLOOD: Lipase: 24 U/L (ref 11–51)

## 2021-07-21 MED ORDER — MORPHINE SULFATE (PF) 4 MG/ML IV SOLN: 4.0000 mg | Freq: Once | INTRAVENOUS | Status: DC

## 2021-07-21 MED ORDER — ALUM & MAG HYDROXIDE-SIMETH 200-200-20 MG/5ML PO SUSP
30.0000 mL | Freq: Once | ORAL | Status: AC
  Administered 2021-07-21: 30 mL via ORAL
  Filled 2021-07-21: qty 30

## 2021-07-21 MED ORDER — SODIUM CHLORIDE 0.9 % IV BOLUS
1000.0000 mL | Freq: Once | INTRAVENOUS | Status: AC
  Administered 2021-07-21: 1000 mL via INTRAVENOUS

## 2021-07-21 MED ORDER — PIPERACILLIN-TAZOBACTAM 3.375 G IVPB 30 MIN
3.3750 g | Freq: Once | INTRAVENOUS | Status: AC
  Administered 2021-07-22: 3.375 g via INTRAVENOUS
  Filled 2021-07-21: qty 50

## 2021-07-21 MED ORDER — FENTANYL CITRATE PF 50 MCG/ML IJ SOSY
25.0000 ug | PREFILLED_SYRINGE | Freq: Once | INTRAMUSCULAR | Status: AC
  Administered 2021-07-21: 25 ug via INTRAVENOUS
  Filled 2021-07-21: qty 1

## 2021-07-21 MED ORDER — ONDANSETRON HCL 4 MG/2ML IJ SOLN
4.0000 mg | Freq: Once | INTRAMUSCULAR | Status: AC
  Administered 2021-07-21: 4 mg via INTRAVENOUS
  Filled 2021-07-21: qty 2

## 2021-07-21 NOTE — ED Notes (Signed)
Unable to pull labs from IVs.  Will ask phlebotomy to attempt.

## 2021-07-21 NOTE — ED Provider Notes (Signed)
[facility] EMERGENCY DEPARTMENT Provider Note   CSN: [id number] Arrival date & time: 07/21/21  1823     History  Chief Complaint  Patient presents with   Abdominal Pain    [name] is a 66 y.o. female.  Pt is a 66 yo female with a pmhx significant for hld, htn, and hx strangulated hernia s/p resection (2012).  Pt said she thinks she ate a bad piece of chicken on on 7/2 and developed n/v then.  She developed abd pain today.  She has not had a bowel movement.  She has not been able to keep anything down.        Home Medications Prior to Admission medications   Medication Sig Start Date End Date Taking? Authorizing Provider  amLODipine (NORVASC) 10 MG tablet TAKE 1 TABLET(10 MG) BY MOUTH DAILY 07/11/17   Gale Journey, Damaris Hippo, PA-C  amoxicillin-clavulanate (AUGMENTIN) 875-125 MG tablet Take 1 tablet by mouth every 12 (twelve) hours. 09/13/19   Joy, Shawn C, PA-C  ibuprofen (ADVIL) 800 MG tablet TAKE 1 TABLET(800 MG) BY MOUTH EVERY 8 HOURS AS NEEDED 07/29/18   Aundra Dubin, PA-C  methocarbamol (ROBAXIN) 500 MG tablet Take 1 tablet (500 mg total) by mouth 2 (two) times daily as needed. 01/06/19   Aundra Dubin, PA-C  ondansetron (ZOFRAN ODT) 4 MG disintegrating tablet Take 1 tablet (4 mg total) by mouth every 8 (eight) hours as needed for nausea or vomiting. 09/13/19   Joy, Shawn C, PA-C  rosuvastatin (CRESTOR) 20 MG tablet TAKE 1 TABLET(20 MG) BY MOUTH AT BEDTIME 07/26/18   [provider]      Allergies    Lisinopril    Review of Systems   Review of Systems  Gastrointestinal:  Positive for abdominal distention, nausea and vomiting.  All other systems reviewed and are negative.   Physical Exam Updated Vital Signs BP 118/69   Pulse (!) 151   Temp 98.1 F (36.7 C) (Oral)   Resp (!) 22   Ht 5\' 1"  (1.549 m)   Wt 97.5 kg   SpO2 97%   BMI 40.62 kg/m  Physical Exam Vitals and nursing note reviewed.  Constitutional:      Appearance: She is  well-developed. She is obese. She is ill-appearing.  HENT:     Head: Normocephalic and atraumatic.     Mouth/Throat:     Mouth: Mucous membranes are dry.  Eyes:     Extraocular Movements: Extraocular movements intact.     Pupils: Pupils are equal, round, and reactive to light.  Cardiovascular:     Rate and Rhythm: Regular rhythm. Tachycardia present.  Pulmonary:     Effort: Pulmonary effort is normal.     Breath sounds: Normal breath sounds.  Abdominal:     General: Abdomen is flat. Bowel sounds are normal.     Palpations: Abdomen is soft.     Tenderness: There is generalized abdominal tenderness.  Musculoskeletal:     Right lower leg: Edema present.     Left lower leg: Edema present.  Skin:    General: Skin is warm.     Capillary Refill: Capillary refill takes less than 2 seconds.  Neurological:     General: No focal deficit present.     Mental Status: She is alert and oriented to person, place, and time.     ED Results / Procedures / Treatments   Labs (all labs ordered are listed, but only abnormal results are displayed) Labs Reviewed  CBC WITH DIFFERENTIAL/PLATELET - Abnormal; Notable for the following components:      Result Value   WBC 11.6 (*)    RDW 18.4 (*)    nRBC 0.3 (*)    Neutro Abs 9.0 (*)    Abs Immature Granulocytes 0.23 (*)    All other components within normal limits  COMPREHENSIVE METABOLIC PANEL - Abnormal; Notable for the following components:   Potassium 2.9 (*)    Chloride 94 (*)    Glucose, Bld 102 (*)    BUN 74 (*)    Creatinine, Ser 5.28 (*)    Calcium 8.7 (*)    Total Protein 6.0 (*)    Albumin 2.7 (*)    GFR, Estimated 8 (*)    Anion gap 22 (*)    All other components within normal limits  LACTIC ACID, PLASMA - Abnormal; Notable for the following components:   Lactic Acid, Venous 6.5 (*)    All other components within normal limits  CULTURE, BLOOD (ROUTINE X 2)  CULTURE, BLOOD (ROUTINE X 2)  LIPASE, BLOOD  MAGNESIUM  URINALYSIS,  ROUTINE W REFLEX MICROSCOPIC  LACTIC ACID, PLASMA    EKG EKG Interpretation  Date/Time:  Thursday July 21 2021 18:30:13 EDT Ventricular Rate:  114 PR Interval:  118 QRS Duration: 93 QT Interval:  301 QTC Calculation: 415 R Axis:   63 Text Interpretation: Sinus tachycardia Posterior infarct, old Repol abnrm, severe global ischemia (LM/MVD) Since last tracing rate faster Confirmed by Isla Pence [phone]) on 07/21/2021 7:39:18 PM  Radiology DG Chest Portable 1 View  Result Date: 07/21/2021 CLINICAL DATA:  Altered mental status and hypotension EXAM: PORTABLE CHEST 1 VIEW COMPARISON:  01/12/2016 FINDINGS: The heart size and mediastinal contours are within normal limits. Both lungs are clear. The visualized skeletal structures are unremarkable. IMPRESSION: No active disease. Electronically Signed   By: Inez Catalina M.D.   On: 07/21/2021 20:27    Procedures Procedures    Medications Ordered in ED Medications  sodium chloride 0.9 % bolus 1,000 mL (0 mLs Intravenous Stopped 07/21/21 2134)  ondansetron (ZOFRAN) injection 4 mg (4 mg Intravenous Given 07/21/21 1900)  sodium chloride 0.9 % bolus 1,000 mL (0 mLs Intravenous Stopped 07/21/21 2134)  fentaNYL (SUBLIMAZE) injection 25 mcg (25 mcg Intravenous Given 07/21/21 2022)  alum & mag hydroxide-simeth (MAALOX/MYLANTA) 200-200-20 MG/5ML suspension 30 mL (30 mLs Oral Given 07/21/21 2147)  sodium chloride 0.9 % bolus 1,000 mL (1,000 mLs Intravenous New Bag/Given 07/21/21 2147)    ED Course/ Medical Decision Making/ A&P Clinical Course as of 07/22/21 1610  Fri Jul 22, 2021  0051 I personally viewed the images from radiology studies and agree with radiologist interpretation: CT shows SBO with transition point in her large ventral hernia.  She also has a marked AKI. Will discuss with General Surgery.  [CS]  0114 Spoke with Dr. Barry Dienes, Gen Surg, who requests a repeat BMP to reassess her K and Cr. She will come evaluate for surgery/admission.  [CS]  0631  Dr. Barry Dienes has evaluated the patient and will plan for OR today. She is requesting the Medicine team admit to manage the AKI and other medical issues.  [CS]  [phone] Spoke with Dr. Alcario Drought, Hospitalist, who requests I speak with the Day Hospitalist after shift change.  [CS]    Clinical Course User Index [CS] Truddie Hidden, MD  Medical Decision Making Amount and/or Complexity of Data Reviewed Labs: ordered. Radiology: ordered.  Risk OTC drugs. Prescription drug management. Decision regarding hospitalization.   This patient presents to the ED for concern of abd pain, this involves an extensive number of treatment options, and is a complaint that carries with it a high risk of complications and morbidity.  The differential diagnosis includes sbo, gastroenteritis, infection, electrolyte abn   Co morbidities that complicate the patient evaluation   hld, htn, and hx strangulated hernia s/p resection (2012)   Additional history obtained:  Additional history obtained from epic chart review External records from outside source obtained and reviewed including EMS report   Lab Tests:  I Ordered, and personally interpreted labs.  The pertinent results include:  cbc with wbc 11.6; cmp with K low at 2.6 and bun 74/Cr 5.28 (last bun/cr 18 and 1.3 on 09/13/19); lactic acid elevated at 6.5   Imaging Studies ordered:  I ordered imaging studies including cxr and ct abd/pelvis  I independently visualized and interpreted imaging which showed  CXR: IMPRESSION:  No active disease.   I agree with the radiologist interpretation   Cardiac Monitoring:  The patient was maintained on a cardiac monitor.  I personally viewed and interpreted the cardiac monitored which showed an underlying rhythm of: sinus tachy   Medicines ordered and prescription drug management:  I ordered medication including ivfs  for dehydration  Reevaluation of the patient after these  medicines showed that the patient improved I have reviewed the patients home medicines and have made adjustments as needed   Test Considered:  Ct abd/pelvis   Critical Interventions:  ivfs    Problem List / ED Course:  AKI:  likely due to dehydration Hypotension and tachycardia:  BP back to nl.  HR much better after fluids. Abd pain:  I suspect a SBO.  However, because of the lactic acidosis and hypotension, I gave pt a dose of zosyn.  CT abd/pelvis is still pending at shift change (several hrs after it was ordered).  Pt signed out to Dr. Karle Starch.   Reevaluation:  After the interventions noted above, I reevaluated the patient and found that they have :improved   Social Determinants of Health:  Lives at home   Dispostion:  After consideration of the diagnostic results and the patients response to treatment, I feel that the patent would benefit from admission.    CRITICAL CARE Performed by: Isla Pence   Total critical care time: 30 minutes  Critical care time was exclusive of separately billable procedures and treating other patients.  Critical care was necessary to treat or prevent imminent or life-threatening deterioration.  Critical care was time spent personally by me on the following activities: development of treatment plan with patient and/or surrogate as well as nursing, discussions with consultants, evaluation of patient's response to treatment, examination of patient, obtaining history from patient or surrogate, ordering and performing treatments and interventions, ordering and review of laboratory studies, ordering and review of radiographic studies, pulse oximetry and re-evaluation of patient's condition.         Final Clinical Impression(s) / ED Diagnoses Final diagnoses:  AKI (acute kidney injury) ([place])  Dehydration    Rx / DC Orders ED Discharge Orders     None         Isla Pence, MD 07/22/21 [phone]

## 2021-07-21 NOTE — ED Notes (Signed)
Pt reports sever abdominal pain and n/v.  Sts "I think I ate something bad."  Also, Pt c/o back pain d/t "hard mattress."

## 2021-07-21 NOTE — ED Triage Notes (Signed)
Pt BIB EMS for severe abdominal pain. Pt was pale, poor cap refill and cold to the touch. Pt states she "ate some bad food" yesterday. Denies CP, abdominal pain tender with palpation.  Alert and oriented x 4    98/palp, 70/54, 85/58 HR 150 ST  End tidal 28-30

## 2021-07-22 ENCOUNTER — Other Ambulatory Visit: Payer: Self-pay

## 2021-07-22 ENCOUNTER — Emergency Department (HOSPITAL_COMMUNITY): Payer: BC Managed Care – PPO

## 2021-07-22 ENCOUNTER — Encounter (HOSPITAL_COMMUNITY)
Admission: EM | Disposition: A | Discharge status: Home Health | Payer: Self-pay | Source: Home / Self Care | Attending: Family Medicine

## 2021-07-22 ENCOUNTER — Inpatient Hospital Stay (HOSPITAL_COMMUNITY): Payer: BC Managed Care – PPO | Admitting: Anesthesiology

## 2021-07-22 ENCOUNTER — Encounter (HOSPITAL_COMMUNITY): Payer: Self-pay | Admitting: Internal Medicine

## 2021-07-22 ENCOUNTER — Inpatient Hospital Stay (HOSPITAL_COMMUNITY): Payer: BC Managed Care – PPO

## 2021-07-22 DIAGNOSIS — R011 Cardiac murmur, unspecified: Secondary | ICD-10-CM | POA: Diagnosis not present

## 2021-07-22 DIAGNOSIS — Z8719 Personal history of other diseases of the digestive system: Secondary | ICD-10-CM | POA: Diagnosis not present

## 2021-07-22 DIAGNOSIS — K436 Other and unspecified ventral hernia with obstruction, without gangrene: Secondary | ICD-10-CM | POA: Diagnosis present

## 2021-07-22 DIAGNOSIS — E860 Dehydration: Secondary | ICD-10-CM | POA: Diagnosis not present

## 2021-07-22 DIAGNOSIS — R9431 Abnormal electrocardiogram [ECG] [EKG]: Secondary | ICD-10-CM | POA: Diagnosis not present

## 2021-07-22 DIAGNOSIS — N179 Acute kidney failure, unspecified: Secondary | ICD-10-CM | POA: Diagnosis not present

## 2021-07-22 DIAGNOSIS — K659 Peritonitis, unspecified: Secondary | ICD-10-CM | POA: Diagnosis not present

## 2021-07-22 DIAGNOSIS — Z9911 Dependence on respirator [ventilator] status: Secondary | ICD-10-CM

## 2021-07-22 DIAGNOSIS — I959 Hypotension, unspecified: Secondary | ICD-10-CM | POA: Diagnosis present

## 2021-07-22 DIAGNOSIS — E876 Hypokalemia: Secondary | ICD-10-CM | POA: Diagnosis present

## 2021-07-22 DIAGNOSIS — Z9889 Other specified postprocedural states: Secondary | ICD-10-CM

## 2021-07-22 DIAGNOSIS — T8241XA Breakdown (mechanical) of vascular dialysis catheter, initial encounter: Secondary | ICD-10-CM | POA: Diagnosis not present

## 2021-07-22 DIAGNOSIS — I10 Essential (primary) hypertension: Secondary | ICD-10-CM | POA: Diagnosis not present

## 2021-07-22 DIAGNOSIS — D72829 Elevated white blood cell count, unspecified: Secondary | ICD-10-CM | POA: Diagnosis present

## 2021-07-22 DIAGNOSIS — N1831 Chronic kidney disease, stage 3a: Secondary | ICD-10-CM | POA: Diagnosis present

## 2021-07-22 DIAGNOSIS — Z6841 Body Mass Index (BMI) 40.0 and over, adult: Secondary | ICD-10-CM

## 2021-07-22 DIAGNOSIS — N1832 Chronic kidney disease, stage 3b: Secondary | ICD-10-CM | POA: Diagnosis present

## 2021-07-22 DIAGNOSIS — I96 Gangrene, not elsewhere classified: Secondary | ICD-10-CM | POA: Diagnosis present

## 2021-07-22 DIAGNOSIS — E43 Unspecified severe protein-calorie malnutrition: Secondary | ICD-10-CM | POA: Diagnosis not present

## 2021-07-22 DIAGNOSIS — A419 Sepsis, unspecified organism: Secondary | ICD-10-CM

## 2021-07-22 DIAGNOSIS — D631 Anemia in chronic kidney disease: Secondary | ICD-10-CM | POA: Diagnosis present

## 2021-07-22 DIAGNOSIS — E785 Hyperlipidemia, unspecified: Secondary | ICD-10-CM | POA: Diagnosis present

## 2021-07-22 DIAGNOSIS — R6521 Severe sepsis with septic shock: Secondary | ICD-10-CM | POA: Diagnosis not present

## 2021-07-22 DIAGNOSIS — Y712 Prosthetic and other implants, materials and accessory cardiovascular devices associated with adverse incidents: Secondary | ICD-10-CM | POA: Diagnosis not present

## 2021-07-22 DIAGNOSIS — J9601 Acute respiratory failure with hypoxia: Secondary | ICD-10-CM | POA: Diagnosis not present

## 2021-07-22 DIAGNOSIS — K551 Chronic vascular disorders of intestine: Secondary | ICD-10-CM | POA: Diagnosis not present

## 2021-07-22 DIAGNOSIS — E872 Acidosis, unspecified: Secondary | ICD-10-CM | POA: Diagnosis present

## 2021-07-22 DIAGNOSIS — K55029 Acute infarction of small intestine, extent unspecified: Secondary | ICD-10-CM | POA: Diagnosis not present

## 2021-07-22 DIAGNOSIS — K912 Postsurgical malabsorption, not elsewhere classified: Secondary | ICD-10-CM | POA: Diagnosis not present

## 2021-07-22 DIAGNOSIS — I120 Hypertensive chronic kidney disease with stage 5 chronic kidney disease or end stage renal disease: Secondary | ICD-10-CM | POA: Diagnosis not present

## 2021-07-22 DIAGNOSIS — I248 Other forms of acute ischemic heart disease: Secondary | ICD-10-CM | POA: Diagnosis present

## 2021-07-22 DIAGNOSIS — K56609 Unspecified intestinal obstruction, unspecified as to partial versus complete obstruction: Secondary | ICD-10-CM | POA: Diagnosis present

## 2021-07-22 DIAGNOSIS — R6510 Systemic inflammatory response syndrome (SIRS) of non-infectious origin without acute organ dysfunction: Secondary | ICD-10-CM | POA: Diagnosis present

## 2021-07-22 DIAGNOSIS — N170 Acute kidney failure with tubular necrosis: Secondary | ICD-10-CM | POA: Diagnosis not present

## 2021-07-22 DIAGNOSIS — R571 Hypovolemic shock: Secondary | ICD-10-CM | POA: Diagnosis not present

## 2021-07-22 DIAGNOSIS — J9600 Acute respiratory failure, unspecified whether with hypoxia or hypercapnia: Secondary | ICD-10-CM | POA: Diagnosis not present

## 2021-07-22 DIAGNOSIS — Z992 Dependence on renal dialysis: Secondary | ICD-10-CM | POA: Diagnosis not present

## 2021-07-22 DIAGNOSIS — J95821 Acute postprocedural respiratory failure: Secondary | ICD-10-CM | POA: Diagnosis not present

## 2021-07-22 DIAGNOSIS — K55019 Acute (reversible) ischemia of small intestine, extent unspecified: Secondary | ICD-10-CM | POA: Diagnosis not present

## 2021-07-22 HISTORY — PX: LAPAROTOMY: SHX154

## 2021-07-22 HISTORY — PX: INGUINAL HERNIA REPAIR: SHX194

## 2021-07-22 LAB — BASIC METABOLIC PANEL
Anion gap: 23 — ABNORMAL HIGH (ref 5–15)
Anion gap: 21 — ABNORMAL HIGH (ref 5–15)
BUN: 81 mg/dL — ABNORMAL HIGH (ref 8–23)
BUN: 85 mg/dL — ABNORMAL HIGH (ref 8–23)
CO2: 28 mmol/L (ref 22–32)
CO2: 20 mmol/L — ABNORMAL LOW (ref 22–32)
Calcium: 8.6 mg/dL — ABNORMAL LOW (ref 8.9–10.3)
Calcium: 6.6 mg/dL — ABNORMAL LOW (ref 8.9–10.3)
Chloride: 92 mmol/L — ABNORMAL LOW (ref 98–111)
Chloride: 103 mmol/L (ref 98–111)
Creatinine, Ser: 5.69 mg/dL — ABNORMAL HIGH (ref 0.44–1.00)
Creatinine, Ser: 6.02 mg/dL — ABNORMAL HIGH (ref 0.44–1.00)
GFR, Estimated: 8 mL/min — ABNORMAL LOW (ref >60)
GFR, Estimated: 7 mL/min — ABNORMAL LOW (ref >60)
Glucose, Bld: 109 mg/dL — ABNORMAL HIGH (ref 70–99)
Glucose, Bld: 104 mg/dL — ABNORMAL HIGH (ref 70–99)
Potassium: 3.4 mmol/L — ABNORMAL LOW (ref 3.5–5.1)
Potassium: 3.7 mmol/L (ref 3.5–5.1)
Sodium: 143 mmol/L (ref 135–145)
Sodium: 144 mmol/L (ref 135–145)

## 2021-07-22 LAB — CBC
HCT: 16.1 % — ABNORMAL LOW (ref 36.0–46.0)
Hemoglobin: 5.4 g/dL — CL (ref 12.0–15.0)
MCH: 32.1 pg (ref 26.0–34.0)
MCHC: 33.5 g/dL (ref 30.0–36.0)
MCV: 95.8 fL (ref 80.0–100.0)
Platelets: 82 10*3/uL — ABNORMAL LOW (ref 150–400)
RBC: 1.68 MIL/uL — ABNORMAL LOW (ref 3.87–5.11)
RDW: 18.3 % — ABNORMAL HIGH (ref 11.5–15.5)
WBC: 2.7 10*3/uL — ABNORMAL LOW (ref 4.0–10.5)
nRBC: 0.7 % — ABNORMAL HIGH (ref 0.0–0.2)

## 2021-07-22 LAB — TYPE AND SCREEN
ABO/RH(D): A POS
Antibody Screen: NEGATIVE

## 2021-07-22 LAB — POCT I-STAT 7, (LYTES, BLD GAS, ICA,H+H)
Acid-Base Excess: 1 mmol/L (ref 0.0–2.0)
Bicarbonate: 25.8 mmol/L (ref 20.0–28.0)
Calcium, Ion: 0.86 mmol/L — CL (ref 1.15–1.40)
HCT: 33 % — ABNORMAL LOW (ref 36.0–46.0)
Hemoglobin: 11.2 g/dL — ABNORMAL LOW (ref 12.0–15.0)
O2 Saturation: 100 %
Potassium: 4.3 mmol/L (ref 3.5–5.1)
Sodium: 140 mmol/L (ref 135–145)
TCO2: 27 mmol/L (ref 22–32)
pCO2 arterial: 39.7 mmHg (ref 32–48)
pH, Arterial: 7.421 (ref 7.35–7.45)
pO2, Arterial: 390 mmHg — ABNORMAL HIGH (ref 83–108)

## 2021-07-22 LAB — LACTIC ACID, PLASMA
Lactic Acid, Venous: 1.5 mmol/L (ref 0.5–1.9)
Lactic Acid, Venous: 2.5 mmol/L (ref 0.5–1.9)
Lactic Acid, Venous: 3.2 mmol/L (ref 0.5–1.9)
Lactic Acid, Venous: 3.4 mmol/L (ref 0.5–1.9)

## 2021-07-22 LAB — GLUCOSE, CAPILLARY
Glucose-Capillary: 71 mg/dL (ref 70–99)
Glucose-Capillary: 77 mg/dL (ref 70–99)
Glucose-Capillary: 80 mg/dL (ref 70–99)

## 2021-07-22 LAB — ABO/RH: ABO/RH(D): A POS

## 2021-07-22 LAB — MRSA NEXT GEN BY PCR, NASAL: MRSA by PCR Next Gen: NOT DETECTED

## 2021-07-22 LAB — HEMOGLOBIN AND HEMATOCRIT, BLOOD
HCT: 29.3 % — ABNORMAL LOW (ref 36.0–46.0)
Hemoglobin: 10 g/dL — ABNORMAL LOW (ref 12.0–15.0)

## 2021-07-22 LAB — CK: Total CK: 304 U/L — ABNORMAL HIGH (ref 38–234)

## 2021-07-22 LAB — HIV ANTIBODY (ROUTINE TESTING W REFLEX): HIV Screen 4th Generation wRfx: NONREACTIVE

## 2021-07-22 SURGERY — LAPAROTOMY, EXPLORATORY
Anesthesia: General | Site: Abdomen

## 2021-07-22 MED ORDER — DEXAMETHASONE SODIUM PHOSPHATE 10 MG/ML IJ SOLN
INTRAMUSCULAR | Status: DC | PRN
  Administered 2021-07-22: 10 mg via INTRAVENOUS

## 2021-07-22 MED ORDER — FENTANYL CITRATE PF 50 MCG/ML IJ SOSY
50.0000 ug | PREFILLED_SYRINGE | Freq: Once | INTRAMUSCULAR | Status: AC
  Administered 2021-07-22: 50 ug via INTRAVENOUS
  Filled 2021-07-22: qty 1

## 2021-07-22 MED ORDER — ACETAMINOPHEN 325 MG PO TABS: 650.0000 mg | ORAL_TABLET | Freq: Four times a day (QID) | ORAL | Status: DC | PRN

## 2021-07-22 MED ORDER — NOREPINEPHRINE 4 MG/250ML-% IV SOLN
0.0000 ug/min | INTRAVENOUS | Status: DC
  Filled 2021-07-22: qty 250

## 2021-07-22 MED ORDER — FENTANYL CITRATE PF 50 MCG/ML IJ SOSY
25.0000 ug | PREFILLED_SYRINGE | INTRAMUSCULAR | Status: DC | PRN
  Administered 2021-07-22 (×2): 25 ug via INTRAVENOUS
  Filled 2021-07-22 (×2): qty 1

## 2021-07-22 MED ORDER — ENOXAPARIN SODIUM 40 MG/0.4ML IJ SOSY: 40.0000 mg | PREFILLED_SYRINGE | INTRAMUSCULAR | Status: DC

## 2021-07-22 MED ORDER — ALBUTEROL SULFATE (2.5 MG/3ML) 0.083% IN NEBU: 2.5000 mg | INHALATION_SOLUTION | Freq: Four times a day (QID) | RESPIRATORY_TRACT | Status: DC | PRN

## 2021-07-22 MED ORDER — SODIUM CHLORIDE 0.9 % IV BOLUS
250.0000 mL | Freq: Once | INTRAVENOUS | Status: AC
Start: 2021-07-22 — End: 2021-07-22
  Administered 2021-07-22: 250 mL via INTRAVENOUS

## 2021-07-22 MED ORDER — ROCURONIUM BROMIDE 10 MG/ML (PF) SYRINGE
PREFILLED_SYRINGE | INTRAVENOUS | Status: DC | PRN
  Administered 2021-07-22 (×4): 50 mg via INTRAVENOUS

## 2021-07-22 MED ORDER — MIDAZOLAM HCL 2 MG/2ML IJ SOLN
INTRAMUSCULAR | Status: AC
  Filled 2021-07-22: qty 2

## 2021-07-22 MED ORDER — ORAL CARE MOUTH RINSE
15.0000 mL | OROMUCOSAL | Status: DC | PRN
Start: 2021-07-22 — End: 2021-07-26

## 2021-07-22 MED ORDER — FENTANYL CITRATE PF 50 MCG/ML IJ SOSY
25.0000 ug | PREFILLED_SYRINGE | Freq: Once | INTRAMUSCULAR | Status: AC
  Administered 2021-07-22: 25 ug via INTRAVENOUS
  Filled 2021-07-22: qty 1

## 2021-07-22 MED ORDER — SODIUM CHLORIDE 0.9 % IV BOLUS
500.0000 mL | Freq: Once | INTRAVENOUS | Status: DC
Start: 2021-07-22 — End: 2021-07-26

## 2021-07-22 MED ORDER — LACTATED RINGERS IV SOLN: INTRAVENOUS | Status: DC | PRN

## 2021-07-22 MED ORDER — PANTOPRAZOLE 2 MG/ML SUSPENSION: 40.0000 mg | Freq: Every day | ORAL | Status: DC

## 2021-07-22 MED ORDER — PANTOPRAZOLE SODIUM 40 MG IV SOLR: 40.0000 mg | Freq: Every day | INTRAVENOUS | Status: DC

## 2021-07-22 MED ORDER — VASOPRESSIN 20 UNIT/ML IV SOLN
INTRAVENOUS | Status: AC
  Filled 2021-07-22: qty 1

## 2021-07-22 MED ORDER — SUCCINYLCHOLINE CHLORIDE 200 MG/10ML IV SOSY
PREFILLED_SYRINGE | INTRAVENOUS | Status: DC | PRN
  Administered 2021-07-22: 130 mg via INTRAVENOUS

## 2021-07-22 MED ORDER — PROPOFOL 10 MG/ML IV BOLUS
INTRAVENOUS | Status: AC
  Filled 2021-07-22: qty 20

## 2021-07-22 MED ORDER — MIDAZOLAM HCL 2 MG/2ML IJ SOLN
INTRAMUSCULAR | Status: DC | PRN
  Administered 2021-07-22: 2 mg via INTRAVENOUS

## 2021-07-22 MED ORDER — SODIUM CHLORIDE 0.9 % IV SOLN: INTRAVENOUS | Status: DC

## 2021-07-22 MED ORDER — PROPOFOL 500 MG/50ML IV EMUL
INTRAVENOUS | Status: DC | PRN
  Administered 2021-07-22: 75 ug/kg/min via INTRAVENOUS

## 2021-07-22 MED ORDER — CALCIUM CHLORIDE 10 % IV SOLN
INTRAVENOUS | Status: DC | PRN
  Administered 2021-07-22: 100 mg via INTRAVENOUS
  Administered 2021-07-22: 200 mg via INTRAVENOUS
  Administered 2021-07-22 (×2): 100 mg via INTRAVENOUS

## 2021-07-22 MED ORDER — HEPARIN SODIUM (PORCINE) 5000 UNIT/ML IJ SOLN
5000.0000 [IU] | Freq: Three times a day (TID) | INTRAMUSCULAR | Status: DC
Start: 2021-07-22 — End: 2021-08-04
  Administered 2021-07-22 – 2021-08-04 (×30): 5000 [IU] via SUBCUTANEOUS
  Filled 2021-07-22 (×27): qty 1

## 2021-07-22 MED ORDER — LIDOCAINE 2% (20 MG/ML) 5 ML SYRINGE
INTRAMUSCULAR | Status: DC | PRN
  Administered 2021-07-22: 40 mg via INTRAVENOUS

## 2021-07-22 MED ORDER — ONDANSETRON HCL 4 MG/2ML IJ SOLN: 4.0000 mg | Freq: Four times a day (QID) | INTRAMUSCULAR | Status: DC | PRN

## 2021-07-22 MED ORDER — PHENYLEPHRINE 80 MCG/ML (10ML) SYRINGE FOR IV PUSH (FOR BLOOD PRESSURE SUPPORT)
PREFILLED_SYRINGE | INTRAVENOUS | Status: DC | PRN
  Administered 2021-07-22 (×3): 80 ug via INTRAVENOUS
  Administered 2021-07-22 (×2): 120 ug via INTRAVENOUS
  Administered 2021-07-22: 80 ug via INTRAVENOUS
  Administered 2021-07-22: 120 ug via INTRAVENOUS

## 2021-07-22 MED ORDER — PIPERACILLIN-TAZOBACTAM IN DEX 2-0.25 GM/50ML IV SOLN
2.2500 g | Freq: Three times a day (TID) | INTRAVENOUS | Status: DC
  Administered 2021-07-22 – 2021-07-23 (×4): 2.25 g via INTRAVENOUS
  Filled 2021-07-22 (×6): qty 50

## 2021-07-22 MED ORDER — FENTANYL CITRATE PF 50 MCG/ML IJ SOSY: 25.0000 ug | PREFILLED_SYRINGE | INTRAMUSCULAR | Status: DC | PRN

## 2021-07-22 MED ORDER — ONDANSETRON HCL 4 MG/2ML IJ SOLN
INTRAMUSCULAR | Status: DC | PRN
  Administered 2021-07-22: 4 mg via INTRAVENOUS

## 2021-07-22 MED ORDER — FENTANYL CITRATE (PF) 250 MCG/5ML IJ SOLN
INTRAMUSCULAR | Status: DC | PRN
  Administered 2021-07-22 (×5): 50 ug via INTRAVENOUS

## 2021-07-22 MED ORDER — ACETAMINOPHEN 650 MG RE SUPP: 650.0000 mg | Freq: Four times a day (QID) | RECTAL | Status: DC | PRN

## 2021-07-22 MED ORDER — 0.9 % SODIUM CHLORIDE (POUR BTL) OPTIME
TOPICAL | Status: DC | PRN
  Administered 2021-07-22 (×3): 1000 mL

## 2021-07-22 MED ORDER — VASOPRESSIN 20 UNIT/ML IV SOLN
INTRAVENOUS | Status: DC | PRN
  Administered 2021-07-22 (×2): 2 [IU] via INTRAVENOUS
  Administered 2021-07-22: 1 [IU] via INTRAVENOUS
  Administered 2021-07-22: 2 [IU] via INTRAVENOUS

## 2021-07-22 MED ORDER — CHLORHEXIDINE GLUCONATE CLOTH 2 % EX PADS
6.0000 | MEDICATED_PAD | Freq: Every day | CUTANEOUS | Status: DC
  Administered 2021-07-22 – 2021-08-15 (×24): 6 via TOPICAL

## 2021-07-22 MED ORDER — CALCIUM GLUCONATE-NACL 1-0.675 GM/50ML-% IV SOLN
1.0000 g | Freq: Once | INTRAVENOUS | Status: AC
Start: 2021-07-22 — End: 2021-07-22
  Administered 2021-07-22: 1000 mg via INTRAVENOUS
  Filled 2021-07-22: qty 50

## 2021-07-22 MED ORDER — SODIUM CHLORIDE 0.9 % IV SOLN: INTRAVENOUS | Status: DC | PRN

## 2021-07-22 MED ORDER — PROPOFOL 1000 MG/100ML IV EMUL
0.0000 ug/kg/min | INTRAVENOUS | Status: DC
  Administered 2021-07-22 (×3): 50 ug/kg/min via INTRAVENOUS
  Administered 2021-07-23: 10 ug/kg/min via INTRAVENOUS
  Filled 2021-07-22 (×3): qty 100

## 2021-07-22 MED ORDER — FENTANYL CITRATE (PF) 100 MCG/2ML IJ SOLN
INTRAMUSCULAR | Status: AC
  Administered 2021-07-22: 25 ug
  Filled 2021-07-22: qty 2

## 2021-07-22 MED ORDER — SODIUM CHLORIDE 0.9% FLUSH
3.0000 mL | Freq: Two times a day (BID) | INTRAVENOUS | Status: DC
  Administered 2021-07-22 – 2021-08-11 (×28): 3 mL via INTRAVENOUS

## 2021-07-22 MED ORDER — ALBUMIN HUMAN 5 % IV SOLN: INTRAVENOUS | Status: DC | PRN

## 2021-07-22 MED ORDER — CHLORHEXIDINE GLUCONATE 0.12 % MT SOLN
15.0000 mL | Freq: Once | OROMUCOSAL | Status: AC
  Administered 2021-07-22: 15 mL via OROMUCOSAL
  Filled 2021-07-22 (×2): qty 15

## 2021-07-22 MED ORDER — FENTANYL CITRATE (PF) 250 MCG/5ML IJ SOLN
INTRAMUSCULAR | Status: AC
  Filled 2021-07-22: qty 5

## 2021-07-22 MED ORDER — NOREPINEPHRINE 4 MG/250ML-% IV SOLN
INTRAVENOUS | Status: DC | PRN
  Administered 2021-07-22: 4 ug/min via INTRAVENOUS

## 2021-07-22 MED ORDER — NOREPINEPHRINE 4 MG/250ML-% IV SOLN
0.0000 ug/min | INTRAVENOUS | Status: DC
  Administered 2021-07-22: 3 ug/min via INTRAVENOUS
  Filled 2021-07-22: qty 250

## 2021-07-22 MED ORDER — PIPERACILLIN-TAZOBACTAM 3.375 G IVPB 30 MIN: 3.3750 g | Freq: Three times a day (TID) | INTRAVENOUS | Status: DC

## 2021-07-22 MED ORDER — ORAL CARE MOUTH RINSE
15.0000 mL | OROMUCOSAL | Status: DC
Start: 2021-07-22 — End: 2021-07-26
  Administered 2021-07-22 – 2021-07-26 (×42): 15 mL via OROMUCOSAL

## 2021-07-22 MED ORDER — SODIUM CHLORIDE 0.9% FLUSH
10.0000 mL | Freq: Two times a day (BID) | INTRAVENOUS | Status: DC
  Administered 2021-07-23 – 2021-08-05 (×17): 10 mL

## 2021-07-22 MED ORDER — PROPOFOL 10 MG/ML IV BOLUS
INTRAVENOUS | Status: DC | PRN
  Administered 2021-07-22: 100 mg via INTRAVENOUS

## 2021-07-22 MED ORDER — PANTOPRAZOLE SODIUM 40 MG IV SOLR
40.0000 mg | Freq: Two times a day (BID) | INTRAVENOUS | Status: DC
  Administered 2021-07-22 – 2021-07-26 (×9): 40 mg via INTRAVENOUS
  Filled 2021-07-22 (×9): qty 10

## 2021-07-22 MED ORDER — ONDANSETRON HCL 4 MG PO TABS: 4.0000 mg | ORAL_TABLET | Freq: Four times a day (QID) | ORAL | Status: DC | PRN

## 2021-07-22 MED ORDER — SODIUM CHLORIDE 0.9 % IV SOLN
INTRAVENOUS | Status: DC
  Administered 2021-07-22: 125 mL/h via INTRAVENOUS

## 2021-07-22 MED ORDER — HYDROMORPHONE HCL 1 MG/ML IJ SOLN: 1.0000 mg | Freq: Once | INTRAMUSCULAR | Status: DC

## 2021-07-22 MED ORDER — ORAL CARE MOUTH RINSE: 15.0000 mL | Freq: Once | OROMUCOSAL | Status: AC

## 2021-07-22 MED ORDER — SODIUM CHLORIDE 0.9% FLUSH: 10.0000 mL | INTRAVENOUS | Status: DC | PRN

## 2021-07-22 MED ORDER — POTASSIUM CHLORIDE 10 MEQ/100ML IV SOLN
10.0000 meq | INTRAVENOUS | Status: AC
  Administered 2021-07-22: 10 meq via INTRAVENOUS
  Filled 2021-07-22: qty 100

## 2021-07-22 SURGICAL SUPPLY — 45 items
APL PRP STRL LF DISP 70% ISPRP (MISCELLANEOUS) ×1
BIOPATCH RED 1 DISK 7.0 (GAUZE/BANDAGES/DRESSINGS) ×1 IMPLANT
CANISTER SUCT 3000ML PPV (MISCELLANEOUS) ×3 IMPLANT
CHLORAPREP W/TINT 26 (MISCELLANEOUS) ×3 IMPLANT
COVER SURGICAL LIGHT HANDLE (MISCELLANEOUS) ×3 IMPLANT
DRAIN CHANNEL 19F RND (DRAIN) ×1 IMPLANT
DRAPE LAPAROSCOPIC ABDOMINAL (DRAPES) ×3 IMPLANT
DRAPE UNIVERSAL (DRAPES) ×3 IMPLANT
DRAPE WARM FLUID 44X44 (DRAPES) ×3 IMPLANT
DRSG OPSITE POSTOP 4X10 (GAUZE/BANDAGES/DRESSINGS) ×1 IMPLANT
DRSG OPSITE POSTOP 4X8 (GAUZE/BANDAGES/DRESSINGS) IMPLANT
DRSG TEGADERM 4X4.75 (GAUZE/BANDAGES/DRESSINGS) ×1 IMPLANT
ELECT BLADE 6.5 EXT (BLADE) ×1 IMPLANT
ELECT CAUTERY BLADE 6.4 (BLADE) ×3 IMPLANT
ELECT REM PT RETURN 9FT ADLT (ELECTROSURGICAL) ×2
ELECTRODE REM PT RTRN 9FT ADLT (ELECTROSURGICAL) ×2 IMPLANT
EVACUATOR SILICONE 100CC (DRAIN) ×1 IMPLANT
GLOVE BIO SURGEON STRL SZ 6.5 (GLOVE) ×3 IMPLANT
GLOVE BIOGEL PI IND STRL 6 (GLOVE) ×2 IMPLANT
GLOVE BIOGEL PI INDICATOR 6 (GLOVE) ×1
GOWN STRL REUS W/ TWL LRG LVL3 (GOWN DISPOSABLE) ×4 IMPLANT
GOWN STRL REUS W/TWL LRG LVL3 (GOWN DISPOSABLE) ×4
HANDLE SUCTION POOLE (INSTRUMENTS) ×2 IMPLANT
KIT BASIN OR (CUSTOM PROCEDURE TRAY) ×3 IMPLANT
KIT TURNOVER KIT B (KITS) ×3 IMPLANT
LIGASURE IMPACT 36 18CM CVD LR (INSTRUMENTS) ×1 IMPLANT
NS IRRIG 1000ML POUR BTL (IV SOLUTION) ×6 IMPLANT
PACK GENERAL/GYN (CUSTOM PROCEDURE TRAY) ×3 IMPLANT
PAD ABD 8X10 STRL (GAUZE/BANDAGES/DRESSINGS) ×1 IMPLANT
PAD ARMBOARD 7.5X6 YLW CONV (MISCELLANEOUS) ×3 IMPLANT
RELOAD PROXIMATE 75MM BLUE (ENDOMECHANICALS) ×20 IMPLANT
RELOAD STAPLE 75 3.8 BLU REG (ENDOMECHANICALS) IMPLANT
SPONGE T-LAP 18X18 ~~LOC~~+RFID (SPONGE) ×2 IMPLANT
STAPLER PROXIMATE 75MM BLUE (STAPLE) ×1 IMPLANT
STAPLER VISISTAT 35W (STAPLE) ×3 IMPLANT
SUCTION POOLE HANDLE (INSTRUMENTS) ×2
SUT ETHILON 2 0 FS 18 (SUTURE) ×1 IMPLANT
SUT PDS AB 1 TP1 54 (SUTURE) IMPLANT
SUT PDS AB 1 TP1 96 (SUTURE) ×2 IMPLANT
SUT SILK 2 0 SH CR/8 (SUTURE) ×5 IMPLANT
SUT SILK 2 0 TIES 10X30 (SUTURE) ×3 IMPLANT
SUT SILK 3 0 SH CR/8 (SUTURE) ×3 IMPLANT
SUT SILK 3 0 TIES 10X30 (SUTURE) ×3 IMPLANT
SUT VIC AB 3-0 SH 18 (SUTURE) IMPLANT
TOWEL GREEN STERILE (TOWEL DISPOSABLE) ×3 IMPLANT

## 2021-07-22 NOTE — Transfer of Care (Signed)
Immediate Anesthesia Transfer of Care Note  Patient: [name]  Procedure(s) Performed: EXPLORATORY LAPAROTOMY (Abdomen) HERNIA REPAIR VENTRAL INCARCERATED (Abdomen)  Patient Location: ICU  Anesthesia Type:General  Level of Consciousness: Patient remains intubated per anesthesia plan  Airway & Oxygen Therapy: Patient remains intubated per anesthesia plan and Patient placed on Ventilator (see vital sign flow sheet for setting)  Post-op Assessment: Report given to RN and Post -op Vital signs reviewed and stable  Post vital signs: Reviewed and stable  Last Vitals:  Vitals Value Taken Time  BP 111/75 07/22/21 1838  Temp    Pulse 112 07/22/21 1842  Resp 16 07/22/21 1839  SpO2 99 % 07/22/21 1842  Vitals shown include unvalidated device data.  Last Pain:  Vitals:   07/22/21 1418  TempSrc:   PainSc: 3       Patients Stated Pain Goal: 1 (58/68/25 7493)  Complications: No notable events documented.

## 2021-07-22 NOTE — Progress Notes (Signed)
eLink Physician-Brief Progress Note Patient Name: [name] DOB: 06-May-1955 MRN: [id number]   Date of Service  07/22/2021  HPI/Events of Note  Repeat Hgb = 10.0.  eICU Interventions  Continue current management.     Intervention Category Major Interventions: Other:  [name] 07/22/2021, 10:09 PM

## 2021-07-22 NOTE — Anesthesia Procedure Notes (Signed)
Central Venous Catheter Insertion Performed by: Oleta Mouse, MD, anesthesiologist Start/End7/07/2021 4:02 PM, 07/22/2021 4:12 PM Preanesthetic checklist: patient identified, IV checked, risks and benefits discussed, surgical consent, monitors and equipment checked, pre-op evaluation, timeout performed and anesthesia consent Patient sedated Hand hygiene performed  and maximum sterile barriers used  Catheter size: 9 Fr Total catheter length 10. Central line was placed.MAC introducer Procedure performed using ultrasound guided technique. Ultrasound Notes:anatomy identified, needle tip was noted to be adjacent to the nerve/plexus identified, no ultrasound evidence of intravascular and/or intraneural injection and image(s) printed for medical record Attempts: 1 Following insertion, dressing applied, line sutured and Biopatch. Post procedure assessment: blood return through all ports and free fluid flow  Patient tolerated the procedure well with no immediate complications.

## 2021-07-22 NOTE — Anesthesia Postprocedure Evaluation (Signed)
Anesthesia Post Note  Patient: Scientist, research (physical sciences)  Procedure(s) Performed: EXPLORATORY LAPAROTOMY (Abdomen) HERNIA REPAIR VENTRAL INCARCERATED (Abdomen)     Patient location during evaluation: ICU Anesthesia Type: General Level of consciousness: sedated Pain management: pain level controlled Vital Signs Assessment: post-procedure vital signs reviewed and stable Respiratory status: patient remains intubated per anesthesia plan Cardiovascular status: stable Postop Assessment: no apparent nausea or vomiting Anesthetic complications: no   No notable events documented.  Last Vitals:  Vitals:   07/22/21 1950 07/22/21 1953  BP:    Pulse:    Resp:    Temp: 36.4 C   SpO2:  100%    Last Pain:  Vitals:   07/22/21 1950  TempSrc: Axillary  PainSc:                  Karyl Kinnier Koleton Duchemin

## 2021-07-22 NOTE — Progress Notes (Addendum)
eLink Physician-Brief Progress Note Patient Name: [name] DOB: 1955-02-06 MRN: [id number]   Date of Service  07/22/2021  HPI/Events of Note  Anemia - Hgb = 11.2 --> 5.4 post op. BP = 140/53 with MAP = 73 and HR = 95. Op note states that EBL = 25 mL. Returned from surgery on a Norepinephrine IV infusion. This doesn't make sense. Will send H/H STAT to verify lab finding.  eICU Interventions  Plan: H/H STAT. If H/H is low will transfuse, however, bedside nurse instructed to let general surgery know about low Hgb as well.      Intervention Category Major Interventions: Other:  Lysle Dingwall 07/22/2021, 9:12 PM

## 2021-07-22 NOTE — Anesthesia Preprocedure Evaluation (Addendum)
Anesthesia Evaluation  Patient identified by MRN, date of birth, ID band Patient awake    Reviewed: Allergy & Precautions, H&P , NPO status , Patient's Chart, lab work & pertinent test results, reviewed documented beta blocker date and time   Airway Mallampati: I  TM Distance: >3 FB Neck ROM: Full    Dental no notable dental hx. (+) Teeth Intact, Dental Advisory Given, Missing, Poor Dentition   Pulmonary neg pulmonary ROS,    Pulmonary exam normal breath sounds clear to auscultation       Cardiovascular Exercise Tolerance: Good hypertension, Pt. on medications negative cardio ROS Normal cardiovascular exam Rhythm:Regular Rate:Normal     Neuro/Psych negative neurological ROS  negative psych ROS   GI/Hepatic negative GI ROS, Neg liver ROS,   Endo/Other  negative endocrine ROSMorbid obesity  Renal/GU ARF and CRFRenal diseasenegative Renal ROS  negative genitourinary   Musculoskeletal negative musculoskeletal ROS (+) Arthritis , Osteoarthritis,    Abdominal (+) + obese,   Peds negative pediatric ROS (+)  Hematology negative hematology ROS (+)   Anesthesia Other Findings   Reproductive/Obstetrics negative OB ROS                            Anesthesia Physical Anesthesia Plan  ASA: 3  Anesthesia Plan: General   Post-op Pain Management:    Induction: Intravenous  PONV Risk Score and Plan: 3 and Ondansetron, Dexamethasone and Treatment may vary due to age or medical condition  Airway Management Planned: Oral ETT  Additional Equipment: None  Intra-op Plan:   Post-operative Plan: Extubation in OR  Informed Consent: I have reviewed the patients History and Physical, chart, labs and discussed the procedure including the risks, benefits and alternatives for the proposed anesthesia with the patient or authorized representative who has indicated his/her understanding and acceptance.      Dental Advisory Given  Plan Discussed with: CRNA and Anesthesiologist  Anesthesia Plan Comments: [name] is an 66 y.o. female.  HPI:  Patient is a lovely 66 year old female who presents with a small bowel obstruction.  She has a chronically incarcerated large abdominal hernia.  This has been repaired multiple times, the last in 2012.  This was done at [facility] by Dr. Johney Maine.  A small bowel resection was performed as well as a biologic mesh placement.  As expected, the hernia recurred.  Dr. Donne Hazel saw her in 2021 and discussed possible repair.  Nothing emergent was needed.   This time, she comes in with multiple days of progressive nausea and vomiting.  Upon admission to the emergency department patient was noted to be afebrile with pulse 10 2-1 51, respirations 17-35, blood pressure 70/58 with improvement up to 149/84 with IV fluids, and O2 saturation currently maintained on room air.  Labs from 7/6 revealed WBC 11.6, potassium 2.9, BUN 74, creatinine 5.28, anion gap 22, and lactic acid 6.5.  CT scan of the abdomen pelvis showing small bowel obstruction with lower abdominal ventral abdominal wall hernia containing majority of small bowel.   )        Anesthesia Quick Evaluation

## 2021-07-22 NOTE — ED Notes (Signed)
Replaced NG securing device with new one

## 2021-07-22 NOTE — Anesthesia Procedure Notes (Addendum)
Procedure Name: Intubation Date/Time: 07/22/2021 3:51 PM  Performed by: Griffin Dakin, CRNAPre-anesthesia Checklist: Patient identified, Emergency Drugs available, Suction available, Patient being monitored and Timeout performed Patient Re-evaluated:Patient Re-evaluated prior to induction Oxygen Delivery Method: Circle system utilized Preoxygenation: Pre-oxygenation with 100% oxygen Induction Type: IV induction, Rapid sequence and Cricoid Pressure applied Laryngoscope Size: Mac and 3 Grade View: Grade II Tube type: Oral Tube size: 7.0 mm Number of attempts: 1 Airway Equipment and Method: Stylet Placement Confirmation: ETT inserted through vocal cords under direct vision, positive ETCO2 and breath sounds checked- equal and bilateral Secured at: 20 cm Tube secured with: Tape Dental Injury: Teeth and Oropharynx as per pre-operative assessment

## 2021-07-22 NOTE — H&P (Addendum)
History and Physical    Patient: [name] MVH:[id number] DOB: 04/22/1955 DOA: 07/21/2021 DOS: the patient was seen and examined on 07/22/2021 PCP: Morrell Riddle, PA-C  Patient coming from: Home via EMS  Chief Complaint:  Chief Complaint  Patient presents with   Abdominal Pain   HPI: [name] is a 66 y.o. female with medical history significant of hypertension, hyperlipidemia, ventral wall hernia with bowel obstruction (2007 requiring open surgery with mesh placement and subsequent episode in 2012 requiring surgery with resection of bowel), and morbid obesity who presented with complaints of abdominal pain which seem to worsen approximately 3 days ago.  History is somewhat limited as the patient is lethargic after receiving pain medications.  Pain was noted to be generalized all over her stomach and she described it as "sharp and everything else".  Symptoms initially due to possibly having some bad chicken.  She developed nausea and vomiting to the point where she was unable to keep any food or liquids down.  Emesis was noted to be black liquid.  Denies seeing any blood present.  She is unable to tell me when her last bowel movement was at this time.  Upon admission to the emergency department patient was noted to be afebrile with pulse 10 2-1 51, respirations 17-35, blood pressure 70/58 with improvement up to 149/84 with IV fluids, and O2 saturation currently maintained on room air.  Labs from 7/6 revealed WBC 11.6, potassium 2.9, BUN 74, creatinine 5.28, anion gap 22, and lactic acid 6.5.  CT scan of the abdomen pelvis showing small bowel obstruction with lower abdominal ventral abdominal wall hernia containing majority of small bowel.  Blood cultures were obtained.  Patient has been given at least 3 L of IV fluids, Zofran, and several doses of fentanyl IV for pain.  General surgery had been consulted and plan to take the patient to surgery given uncontrolled pain and rising lactic.   Nasogastric tube had been placed and confirmed with abdominal x-ray.  TRH called to admit due to AKI.   Review of Systems: As mentioned in the history of present illness. All other systems reviewed and are negative. Past Medical History:  Diagnosis Date   Abdominal distention    Abdominal pain    Arthritis    Blood transfusion without reported diagnosis    Hernia    Hyperlipidemia    Hypertension    Leg swelling    Strangulated recurrent ventral incisional hernia, s/p small intestine resection 09/22/2010   Past Surgical History:  Procedure Laterality Date   ABDOMINAL HYSTERECTOMY     bowel obstruction  09/12/2010   HERNIA REPAIR  2007   incarcerated, open with biologic mesh   HERNIA REPAIR  Aug2012   strangulated, redo open with biologic mesh   SMALL INTESTINE SURGERY  09/12/10   SBR in incarcerated [facility]   Social History:  reports that she has never smoked. She has never used smokeless tobacco. She reports that she does not drink alcohol and does not use drugs.  Allergies  Allergen Reactions   Lisinopril Cough    Family History  Problem Relation Age of Onset   Heart disease Mother        heart attack    Heart disease Father        heart attack    Hypertension Sister    Breast cancer Sister     Prior to Admission medications   Medication Sig Start Date End Date Taking? Authorizing Provider  amLODipine (NORVASC)  10 MG tablet TAKE 1 TABLET(10 MG) BY MOUTH DAILY 07/11/17   Valarie Cones, Dema Severin, PA-C  amoxicillin-clavulanate (AUGMENTIN) 875-125 MG tablet Take 1 tablet by mouth every 12 (twelve) hours. 09/13/19   Joy, Shawn C, PA-C  ibuprofen (ADVIL) 800 MG tablet TAKE 1 TABLET(800 MG) BY MOUTH EVERY 8 HOURS AS NEEDED 07/29/18   Cristie Hem, PA-C  methocarbamol (ROBAXIN) 500 MG tablet Take 1 tablet (500 mg total) by mouth 2 (two) times daily as needed. 01/06/19   Cristie Hem, PA-C  ondansetron (ZOFRAN ODT) 4 MG disintegrating tablet Take 1 tablet (4 mg total) by mouth every 8  (eight) hours as needed for nausea or vomiting. 09/13/19   Joy, Shawn C, PA-C  rosuvastatin (CRESTOR) 20 MG tablet TAKE 1 TABLET(20 MG) BY MOUTH AT BEDTIME 07/26/18   [provider]    Physical Exam: Vitals:   07/22/21 0632 07/22/21 0700 07/22/21 0715 07/22/21 0730  BP: 99/63 100/77 97/74 100/82  Pulse: (!) 113 (!) 134 (!) 129 (!) 136  Resp: (!) 29 (!) 29 20 (!) 27  Temp:      TempSrc:      SpO2: 98% 94% 98% 95%  Weight:      Height:       Constitutional: Morbidly obese female who appears ill and is lethargic  Eyes: PERRL, lids and conjunctivae normal ENMT: Mucous membranes are dry.  NG tube in place draining bile. Neck: normal, supple, no masses, no thyromegaly Respiratory: Tachypneic with decreased overall aeration's.  Currently O2 saturation maintained on room air. Cardiovascular: Tachycardia. No extremity edema. 2+ pedal pulses.  Abdomen: Healed prior abdominal scar present.  Generalized abdominal tenderness to palp with large ventral hernia noted to the right side of her abdomen at this time.  Bowel sounds Musculoskeletal: no clubbing / cyanosis. No joint deformity upper and lower extremities. Good ROM, no contractures. Normal muscle tone.  Skin: no rashes, lesions, ulcers. No induration Neurologic: CN 2-12 grossly intact. Sensation intact, DTR normal. Strength 5/5 in all 4.  Psychiatric: Normal judgment and insight.  Lethargic, but oriented x3.  Data Reviewed:  EKG reveals sinus tachycardia 114 bpm  Assessment and Plan: Recurrent small bowel obstruction secondary to ventral wall hernia Sent with complaints of abdominal pain with nausea and vomiting.  Emesis was noted to be black in appearance without presence of blood.  Risk factors include prior history of least 2 bowel obstructions in the past related to ventral hernia.  The last episode occurred in 2012 when she had required resection of bowel.  CT imaging revealed small bowel obstruction with lower abdominal  ventral abdominal wall hernia containing majority of small bowel.  Surgery have been consulted and nasogastric tube have been placed. -Admit to a progressive bed -N.p.o. -Continue nasogastric tube to suction -Fentanyl 25 mcg IV as needed for pain -Protonix 40 mg IV twice daily for GI prophylaxis -Appreciate general surgery consultative services, will follow-up for any further recommendations  SIRS Acute.  Patient was noted to be tachycardic and tachypneic on admission meeting SIRS criteria.  Blood cultures had been obtained and patient was started on empiric antibiotics of Zosyn. -Follow-up blood cultures -Check urinalysis -Continue empiric antibiotics of Zosyn  Lactic acidosis leukocytosis Acute.  On admission WBC was 11.6 and lactic acid 6.5 with improvement after IV fluids down to 2.5.  Suspect likely reactive to above.  Patient was noted to have elevated anion gap of 22->23.  Thought likely related to lactic acidosis as CO2 was within normal  limits. -Refer to SIRS plan -Recheck CBC and trend lactic acid levels  Acute renal failure superimposed on chronic kidney disease stage IIIb Labs revealed creatinine 5.28-> 5.69 with BUN 74->81.  Patient had been bolused at least 3 L of IV fluids.  CT imaging of the abdomen pelvis did not note any signs of obstruction.  No urinalysis have been able to be obtained. -Strict I&Os -Follow-up urinalysis -Continue normal saline IV fluids at 150 mL/h as tolerated -Daily monitoring of kidney function  Transient hypotension Acute.  On admission initial blood pressure noted to be 70/58.  Patient had been bolused at least 3 L of IV fluids with improvement in symptoms.  Home blood pressure regimen includes amlodipine 10 mg daily. -Goal MAP at least 65 -Continue IV fluids as noted above  Hypokalemia Acute.  On admission patient initial potassium was noted to be 2.9.  Repeat check was 3.4 this morning. -Give potassium chloride 20 mEq IV x1 -Continue to  monitor, but replace cautiously given kidney injury  History of small bowel obstruction As noted above  Morbid obesity BMI 40.62 kg/m2  DVT prophylaxis: Heparin Advance Care Planning:   Code Status: Full Code    Consults: General surgery  Family Communication: Patient's sister updated over the phone.  Severity of Illness: The appropriate patient status for this patient is INPATIENT. Inpatient status is judged to be reasonable and necessary in order to provide the required intensity of service to ensure the patient's safety. The patient's presenting symptoms, physical exam findings, and initial radiographic and laboratory data in the context of their chronic comorbidities is felt to place them at high risk for further clinical deterioration. Furthermore, it is not anticipated that the patient will be medically stable for discharge from the hospital within 2 midnights of admission.   * I certify that at the point of admission it is my clinical judgment that the patient will require inpatient hospital care spanning beyond 2 midnights from the point of admission due to high intensity of service, high risk for further deterioration and high frequency of surveillance required.*  Author: Clydie Braun, MD 07/22/2021 7:51 AM  For on call review [email or web address].

## 2021-07-22 NOTE — Consult Note (Signed)
NAME:  [name], MRN:  [id number], DOB:  [birth year], LOS: 0 ADMISSION DATE:  07/21/2021, CONSULTATION DATE:  07/22/21 REFERRING MD:  Tamala Julian, CHIEF COMPLAINT:  abdominal pain, SBO   History of Present Illness:  [name] is a 66 y.o. F with PMH significant for HTN, HL and chronically incarcerated large abdominal wall hernia repaired multiple times, most recently SBO resection with mesh placement in 2012 with hernia recurrence.  She presented to the ED with severe abdominal pain and vomiting on 7/7.  Labs were significant for Creatinine of 5.28, lactic acid 6.5, WBC 11.6 and CT abd/pelvis with SBO, large lower abdominal ventral wall hernia containing most of the small bowel.    Surgery was consulted and she was taken to the OR for exlap and hernia repair.  Post-op pt was transferred to the ICU and PCCM consulted.   Pertinent  Medical History   has a past medical history of Abdominal distention, Abdominal pain, Arthritis, Blood transfusion without reported diagnosis, Hernia, Hyperlipidemia, Hypertension, Leg swelling, and Strangulated recurrent ventral incisional hernia, s/p small intestine resection (09/22/2010).   Significant Hospital Events: Including procedures, antibiotic start and stop dates in addition to other pertinent events   7/7 presented to the ED with abdominal pain and SBO, to OR for ex-lap, txfr to ICU   Interim History / Subjective:  Transferred to ICU on 20mcg Levophed  Objective   Blood pressure 102/65, pulse (!) 115, temperature 98.1 F (36.7 C), temperature source Oral, resp. rate 13, height 5\' 1"  (1.549 m), weight 97.5 kg, SpO2 99 %.        Intake/Output Summary (Last 24 hours) at 07/22/2021 1802 Last data filed at 07/22/2021 1752 Gross per 24 hour  Intake 2806.38 ml  Output 2050 ml  Net 756.38 ml   Filed Weights   07/21/21 1923  Weight: 97.5 kg    General:  Critically ill F, intubated and sedated  HEENT: MM pink/dry, ETT in place Neuro: examined on  propofol after Rocuronium, RASS -5 CV: s1s2 rrrr, no m/r/g PULM:  mechanical breath sounds bilaterally, on full vent support GI: soft, ventral surgical site, dressed without bleeding Extremities: warm/dry, no edema  Skin: no rashes or lesions   Resolved Hospital Problem list     Assessment & Plan:    Acute Small Bowel Obstruction  Septic Shock Most of small bowel within ventral hernia Taken to OR for ex-lap  -transferred to ICU, remains intubated -post-op plan per surgery team -pain control -continue Zosyn -received 3L bolus IVF in the ED and maintenance fluids -pressors if needed to maintain MAP >65 -trend lactic acid   Post-op Ventilator management -Maintain full vent support with SAT/SBT as tolerated -titrate Vent setting to maintain SpO2 greater than or equal to 90%. -HOB elevated 30 degrees. -Plateau pressures less than 30 cm H20.  -Follow chest x-ray, ABG prn.   -Bronchial hygiene and RT/bronchodilator protocol.    Acute on Chronic Kidney Injury CKD stage IIIb Baseline creatinine 1.1-1.3, 5.28 on admission Likely pre-renal secondary to volume depletion -monitor renal indices, UOP and electrolytes   Hypokalemia -monitor and replete prn    NAGMA Suspect secondary to renal failure  -follow BMP  Best Practice (right click and "Reselect all SmartList Selections" daily)   Diet/type: NPO DVT prophylaxis: SCD GI prophylaxis: PPI Lines: yes and it is still needed Foley:  Yes, and it is still needed Code Status:  full code Last date of multidisciplinary goals of care discussion [pending]  Labs   CBC: Recent  Labs  Lab 07/21/21 2056  WBC 11.6*  NEUTROABS 9.0*  HGB 13.2  HCT 40.8  MCV 96.7  PLT 161    Basic Metabolic Panel: Recent Labs  Lab 07/21/21 2056 07/22/21 0246  NA 143 143  K 2.9* 3.4*  CL 94* 92*  CO2 27 28  GLUCOSE 102* 109*  BUN 74* 81*  CREATININE 5.28* 5.69*  CALCIUM 8.7* 8.6*  MG 2.1  --    GFR: Estimated Creatinine  Clearance: 10.3 mL/min (A) (by C-G formula based on SCr of 5.69 mg/dL (H)). Recent Labs  Lab 07/21/21 2056 07/21/21 2345 07/22/21 1030 07/22/21 1414  WBC 11.6*  --   --   --   LATICACIDVEN 6.5* 2.5* 3.4* 3.2*    Liver Function Tests: Recent Labs  Lab 07/21/21 2056  AST 27  ALT 17  ALKPHOS 56  BILITOT 0.6  PROT 6.0*  ALBUMIN 2.7*   Recent Labs  Lab 07/21/21 2056  LIPASE 24   No results for input(s): "AMMONIA" in the last 168 hours.  ABG    Component Value Date/Time   TCO2 25 09/27/2010 2357     Coagulation Profile: No results for input(s): "INR", "PROTIME" in the last 168 hours.  Cardiac Enzymes: Recent Labs  Lab 07/22/21 0246  CKTOTAL 304*    HbA1C: Hemoglobin A1C  Date/Time Value Ref Range Status  09/17/2013 03:45 PM 5.9  Final   Hgb A1c MFr Bld  Date/Time Value Ref Range Status  11/21/2016 04:39 PM 5.9 (H) 4.8 - 5.6 % Final    Comment:             Prediabetes: 5.7 - 6.4          Diabetes: >6.4          Glycemic control for adults with diabetes: <7.0     CBG: No results for input(s): "GLUCAP" in the last 168 hours.  Review of Systems:   Unable to obtain  Past Medical History:  She,  has a past medical history of Abdominal distention, Abdominal pain, Arthritis, Blood transfusion without reported diagnosis, Hernia, Hyperlipidemia, Hypertension, Leg swelling, and Strangulated recurrent ventral incisional hernia, s/p small intestine resection (09/22/2010).   Surgical History:   Past Surgical History:  Procedure Laterality Date   ABDOMINAL HYSTERECTOMY     bowel obstruction  09/12/2010   HERNIA REPAIR  2007   incarcerated, open with biologic mesh   HERNIA REPAIR  Aug2012   strangulated, redo open with biologic mesh   SMALL INTESTINE SURGERY  09/12/10   SBR in incarcerated [place]     Social History:   reports that she has never smoked. She has never used smokeless tobacco. She reports that she does not drink alcohol and does not use drugs.    Family History:  Her family history includes Breast cancer in her sister; Heart disease in her father and mother; Hypertension in her sister.   Allergies Allergies  Allergen Reactions   Lisinopril Cough     Home Medications  Prior to Admission medications   Medication Sig Start Date End Date Taking? Authorizing Provider  amLODipine (NORVASC) 10 MG tablet TAKE 1 TABLET(10 MG) BY MOUTH DAILY 07/11/17  Yes Weber, Sarah L, PA-C  amoxicillin-clavulanate (AUGMENTIN) 875-125 MG tablet Take 1 tablet by mouth every 12 (twelve) hours. Patient not taking: Reported on 07/22/2021 09/13/19   Joy, Raquel Sarna C, PA-C  ibuprofen (ADVIL) 800 MG tablet TAKE 1 TABLET(800 MG) BY MOUTH EVERY 8 HOURS AS NEEDED Patient not taking: Reported on  07/22/2021 07/29/18   Aundra Dubin, PA-C  methocarbamol (ROBAXIN) 500 MG tablet Take 1 tablet (500 mg total) by mouth 2 (two) times daily as needed. Patient not taking: Reported on 07/22/2021 01/06/19   Aundra Dubin, PA-C  ondansetron (ZOFRAN ODT) 4 MG disintegrating tablet Take 1 tablet (4 mg total) by mouth every 8 (eight) hours as needed for nausea or vomiting. Patient not taking: Reported on 07/22/2021 09/13/19   Lorayne Bender, PA-C     Critical care time: 45 minutes     CRITICAL CARE Performed by: Otilio Carpen Zoriyah Scheidegger   Total critical care time: 45 minutes  Critical care time was exclusive of separately billable procedures and treating other patients.  Critical care was necessary to treat or prevent imminent or life-threatening deterioration.  Critical care was time spent personally by me on the following activities: development of treatment plan with patient and/or surrogate as well as nursing, discussions with consultants, evaluation of patient's response to treatment, examination of patient, obtaining history from patient or surrogate, ordering and performing treatments and interventions, ordering and review of laboratory studies, ordering and review of radiographic  studies, pulse oximetry and re-evaluation of patient's condition.  Otilio Carpen Dalaina Tates, PA-C [facility] See Amion for pager If no response to pager , please call [phone] until 7pm After 7:00 pm call Elink  [phone]

## 2021-07-22 NOTE — ED Provider Notes (Signed)
Care of the patient assumed at the change of shift. Here for severe abdominal pain, vomiting. History of strangulated hernia requiring resection in 2012. Initially hypotensive with elevated lactic acid, both improved with  IVF. Awaiting CT.  Physical Exam  BP 118/78   Pulse (!) 114   Temp 98.2 F (36.8 C) (Oral)   Resp (!) 22   Ht 5\' 1"  (1.549 m)   Wt 97.5 kg   SpO2 98%   BMI 40.62 kg/m   Physical Exam Abdomen remains diffusely tender/guarding  Procedures  .Critical Care  Performed by: Truddie Hidden, MD Authorized by: Truddie Hidden, MD   Critical care provider statement:    Critical care time (minutes):  35   Critical care time was exclusive of:  Separately billable procedures and treating other patients   Critical care was necessary to treat or prevent imminent or life-threatening deterioration of the following conditions:  Circulatory failure, sepsis and shock   Critical care was time spent personally by me on the following activities:  Development of treatment plan with patient or surrogate, discussions with consultants, evaluation of patient's response to treatment, examination of patient, ordering and review of laboratory studies, ordering and review of radiographic studies, ordering and performing treatments and interventions, pulse oximetry, re-evaluation of patient's condition and review of old charts   Care discussed with: admitting provider     ED Course / MDM   Clinical Course as of 07/22/21 0644  Fri Jul 22, 2021  0051 I personally viewed the images from radiology studies and agree with radiologist interpretation: CT shows SBO with transition point in her large ventral hernia.  She also has a marked AKI. Will discuss with General Surgery.  [CS]  0114 Spoke with Dr. Barry Dienes, Gen Surg, who requests a repeat BMP to reassess her K and Cr. She will come evaluate for surgery/admission.  [CS]  0631 Dr. Barry Dienes has evaluated the patient and will plan for OR today. She  is requesting the Medicine team admit to manage the AKI and other medical issues.  [CS]  [phone] Spoke with Dr. Alcario Drought, Hospitalist, who requests I speak with the Day Hospitalist after shift change.  [CS]    Clinical Course User Index [CS] Truddie Hidden, MD   Medical Decision Making Problems Addressed: AKI (acute kidney injury) [facility]): acute illness or injury Hypokalemia: acute illness or injury Lactic acidosis: acute illness or injury Small bowel obstruction [facility]): acute illness or injury  Amount and/or Complexity of Data Reviewed Labs: ordered. Radiology: ordered.  Risk OTC drugs. Prescription drug management. Decision regarding hospitalization.          Truddie Hidden, MD 07/22/21 [phone]

## 2021-07-22 NOTE — ED Notes (Signed)
MD at bedside. 

## 2021-07-22 NOTE — Consult Note (Signed)
Reason for Consult:Small bowel obstruction Referring Physician: Nyla Creason is an 66 y.o. female.  HPI:  Patient is a lovely 66 year old female who presents with a small bowel obstruction.  She has a chronically incarcerated large abdominal hernia.  This has been repaired multiple times, the last in 2012.  This was done at [facility] long by Dr. Johney Maine.  A small bowel resection was performed as well as a biologic mesh placement.  As expected, the hernia recurred.  Dr. Donne Hazel saw her in 2021 and discussed possible repair.  Nothing emergent was needed.   This time, she comes in with multiple days of progressive nausea and vomiting.  She has not been able to keep anything down for multiple days.  She states the nausea and vomiting is the overriding symptom and the pain is not severe.  She is extremely thirsty but has not been able to keep down liquids.  She has been throwing up "black" liquid.  She denies any sick contacts of which she is aware.  She does not think that the hernia has gotten dramatically larger.  Of note, she normally gets around well and works 2 jobs.  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  She has been out this week as school is off.  She also works periodically for [employer] at [employer].  Past Medical History:  Diagnosis Date   Abdominal distention    Abdominal pain    Arthritis    Blood transfusion without reported diagnosis    Hernia    Hyperlipidemia    Hypertension    Leg swelling    Strangulated recurrent ventral incisional hernia, s/p small intestine resection 09/22/2010    Past Surgical History:  Procedure Laterality Date   ABDOMINAL HYSTERECTOMY     bowel obstruction  09/12/2010   HERNIA REPAIR  2007   incarcerated, open with biologic mesh   HERNIA REPAIR  Aug2012   strangulated, redo open with biologic mesh   SMALL INTESTINE SURGERY  09/12/10   SBR in incarcerated [place]    Family History  Problem Relation  Age of Onset   Heart disease Mother        heart attack    Heart disease Father        heart attack    Hypertension Sister    Breast cancer Sister     Social History:  reports that she has never smoked. She has never used smokeless tobacco. She reports that she does not drink alcohol and does not use drugs.  Allergies:  Allergies  Allergen Reactions   Lisinopril Cough    Medications:  amLODipine (NORVASC) 10 MG tablet amoxicillin-clavulanate (AUGMENTIN) 875-125 MG tablet ibuprofen (ADVIL) 800 MG tablet methocarbamol (ROBAXIN) 500 MG tablet ondansetron (ZOFRAN ODT) 4 MG disintegrating tablet  rosuvastatin (CRESTOR) 20 MG tablet   Results for orders placed or performed during the hospital encounter of 07/21/21 (from the past 48 hour(s))  CBC with Differential     Status: Abnormal   Collection Time: 07/21/21  8:56 PM  Result Value Ref Range   WBC 11.6 (H) 4.0 - 10.5 K/uL   RBC 4.22 3.87 - 5.11 MIL/uL   Hemoglobin 13.2 12.0 - 15.0 g/dL   HCT 40.8 36.0 - 46.0 %   MCV 96.7 80.0 - 100.0 fL   MCH 31.3 26.0 - 34.0 pg   MCHC 32.4 30.0 - 36.0 g/dL   RDW 18.4 (H) 11.5 - 15.5 %   Platelets 272  150 - 400 K/uL   nRBC 0.3 (H) 0.0 - 0.2 %   Neutrophils Relative % 79 %   Neutro Abs 9.0 (H) 1.7 - 7.7 K/uL   Lymphocytes Relative 11 %   Lymphs Abs 1.3 0.7 - 4.0 K/uL   Monocytes Relative 8 %   Monocytes Absolute 1.0 0.1 - 1.0 K/uL   Eosinophils Relative 0 %   Eosinophils Absolute 0.0 0.0 - 0.5 K/uL   Basophils Relative 0 %   Basophils Absolute 0.1 0.0 - 0.1 K/uL   Immature Granulocytes 2 %   Abs Immature Granulocytes 0.23 (H) 0.00 - 0.07 K/uL    Comment: Performed at [facility] [address], [place], [place] [address]  Comprehensive metabolic panel     Status: Abnormal   Collection Time: 07/21/21  8:56 PM  Result Value Ref Range   Sodium 143 135 - 145 mmol/L   Potassium 2.9 (L) 3.5 - 5.1 mmol/L   Chloride 94 (L) 98 - 111 mmol/L   CO2 27 22 - 32 mmol/L   Glucose, Bld  102 (H) 70 - 99 mg/dL    Comment: Glucose reference range applies only to samples taken after fasting for at least 8 hours.   BUN 74 (H) 8 - 23 mg/dL   Creatinine, Ser 5.28 (H) 0.44 - 1.00 mg/dL   Calcium 8.7 (L) 8.9 - 10.3 mg/dL   Total Protein 6.0 (L) 6.5 - 8.1 g/dL   Albumin 2.7 (L) 3.5 - 5.0 g/dL   AST 27 15 - 41 U/L   ALT 17 0 - 44 U/L   Alkaline Phosphatase 56 38 - 126 U/L   Total Bilirubin 0.6 0.3 - 1.2 mg/dL   GFR, Estimated 8 (L) >60 mL/min    Comment: (NOTE) Calculated using the CKD-EPI Creatinine Equation (2021)    Anion gap 22 (H) 5 - 15    Comment: REPEATED TO VERIFY Performed at [facility], [place] [address] [place], [place] [address]   Lipase, blood     Status: None   Collection Time: 07/21/21  8:56 PM  Result Value Ref Range   Lipase 24 11 - 51 U/L    Comment: Performed at [facility] [address], [place], [place] [address]  Lactic acid, plasma     Status: Abnormal   Collection Time: 07/21/21  8:56 PM  Result Value Ref Range   Lactic Acid, Venous 6.5 (HH) 0.5 - 1.9 mmol/L    Comment: CRITICAL RESULT CALLED TO, READ BACK BY AND VERIFIED WITH:  Maida Sale RN, 2201, 07/21/21, EADEDOKUN Performed at [facility], [place] [address] [place], [place] [address]   Magnesium     Status: None   Collection Time: 07/21/21  8:56 PM  Result Value Ref Range   Magnesium 2.1 1.7 - 2.4 mg/dL    Comment: Performed at [facility] [address], [place], [place] [address]  Lactic acid, plasma     Status: Abnormal   Collection Time: 07/21/21 11:45 PM  Result Value Ref Range   Lactic Acid, Venous 2.5 (HH) 0.5 - 1.9 mmol/L    Comment: CRITICAL VALUE NOTED.  VALUE IS CONSISTENT WITH PREVIOUSLY REPORTED AND CALLED VALUE. Performed at [facility], [place] [address] [place], [place] [address]   Basic metabolic panel     Status: Abnormal   Collection Time: 07/22/21  2:46 AM  Result Value Ref Range   Sodium 143 135 - 145 mmol/L  Potassium 3.4 (L) 3.5  - 5.1 mmol/L   Chloride 92 (L) 98 - 111 mmol/L   CO2 28 22 - 32 mmol/L   Glucose, Bld 109 (H) 70 - 99 mg/dL    Comment: Glucose reference range applies only to samples taken after fasting for at least 8 hours.   BUN 81 (H) 8 - 23 mg/dL   Creatinine, Ser 5.69 (H) 0.44 - 1.00 mg/dL   Calcium 8.6 (L) 8.9 - 10.3 mg/dL   GFR, Estimated 8 (L) >60 mL/min    Comment: (NOTE) Calculated using the CKD-EPI Creatinine Equation (2021)    Anion gap 23 (H) 5 - 15    Comment: REPEATED TO VERIFY Performed at [facility] [address], [place], [place] [address]     DG Abdomen 1 View  Result Date: 07/22/2021 CLINICAL DATA:  Check gastric catheter placement EXAM: ABDOMEN - 1 VIEW COMPARISON:  CT from the previous day. FINDINGS: Gastric catheter is been advanced into the stomach. No free air is seen. The known small bowel obstruction is not well appreciated on this exam. IMPRESSION: Gastric catheter within the stomach. Electronically Signed   By: Inez Catalina M.D.   On: 07/22/2021 02:31   CT ABDOMEN PELVIS WO CONTRAST  Result Date: 07/22/2021 CLINICAL DATA:  Abdominal pain, acute.  Nausea and vomiting. EXAM: CT ABDOMEN AND PELVIS WITHOUT CONTRAST TECHNIQUE: Multidetector CT imaging of the abdomen and pelvis was performed following the standard protocol without IV contrast. RADIATION DOSE REDUCTION: This exam was performed according to the departmental dose-optimization program which includes automated exposure control, adjustment of the mA and/or kV according to patient size and/or use of iterative reconstruction technique. COMPARISON:  CT 09/13/2019 FINDINGS: Lower chest: Ill-defined opacities in the right lower and middle lobe favoring atelectasis. No pleural fluid. Hepatobiliary: No focal hepatic abnormality on this unenhanced exam. Layering gallstones and probable sludge. No pericholecystic inflammation or biliary dilatation. Pancreas: Pancreatic atrophy.  No ductal dilatation or inflammation. Spleen:  Punctate granuloma.  Normal in size. Adrenals/Urinary Tract: No adrenal nodule. Mild bilateral renal parenchymal atrophy. No hydronephrosis. Small cyst in the lower left kidney, needing no further follow-up. No renal calculi. Unremarkable urinary bladder. Stomach/Bowel: Large complex lower ventral abdominal wall hernia, with hernia contents deviating to the right. Majority of the small bowel is located within this ventral abdominal wall hernia. Hernia contains multiple loops of small bowel. Small bowel obstruction with dilated fluid-filled small bowel. The stomach is prominently distended with fluid. There is also fluid distending the included distal esophagus. Small bowel transition point right aspect of the ventral hernia, for example series 4, image 44 and series 6, image 48. Multiple enteric sutures are noted within small bowel in the hernia sac. Multifocal colonic diverticulosis without focal diverticulitis. There is mild mesenteric edema in the small bowel without small bowel pneumatosis. Vascular/Lymphatic: Aortic atherosclerosis. No aortic aneurysm. No portal venous or mesenteric gas. No bulky abdominopelvic adenopathy. Reproductive: Electronic records lists history of hysterectomy, however in atrophic uterus is visualized in the pelvis. No adnexal mass. Other: No ascites or free air. Mesenteric edema and stranding involving small bowel both within the ventral abdominal wall hernia at and just proximal to. Musculoskeletal: Scoliosis and degenerative change in the spine. There are no acute or suspicious osseous abnormalities. IMPRESSION: 1. Small bowel obstruction. There is a large lower abdominal ventral abdominal wall hernia containing majority of the small bowel, the transition point is located within this ventral abdominal wall hernia. 2. Colonic diverticulosis without focal  diverticulitis. 3. Cholelithiasis without acute inflammation. Aortic Atherosclerosis (ICD10-I70.0). Electronically Signed   By:  Keith Rake M.D.   On: 07/22/2021 00:20   DG Chest Portable 1 View  Result Date: 07/21/2021 CLINICAL DATA:  Altered mental status and hypotension EXAM: PORTABLE CHEST 1 VIEW COMPARISON:  01/12/2016 FINDINGS: The heart size and mediastinal contours are within normal limits. Both lungs are clear. The visualized skeletal structures are unremarkable. IMPRESSION: No active disease. Electronically Signed   By: Inez Catalina M.D.   On: 07/21/2021 20:27    Review of Systems  Gastrointestinal:  Positive for abdominal pain (at hernia site), nausea and vomiting.  Musculoskeletal:  Positive for back pain.  All other systems reviewed and are negative.  Blood pressure 99/63, pulse (!) 113, temperature 98.1 F (36.7 C), temperature source Oral, resp. rate (!) 29, height 5\' 1"  (1.549 m), weight 97.5 kg, SpO2 98 %. Physical Exam Vitals reviewed.  Constitutional:      General: She is in acute distress.     Appearance: She is ill-appearing.  HENT:     Head: Normocephalic and atraumatic.     Mouth/Throat:     Comments: dry Eyes:     General: No scleral icterus.    Extraocular Movements: Extraocular movements intact.     Pupils: Pupils are equal, round, and reactive to light.  Cardiovascular:     Rate and Rhythm: Regular rhythm. Tachycardia present.     Heart sounds: Normal heart sounds. No murmur heard.    No friction rub. No gallop.  Pulmonary:     Effort: Pulmonary effort is normal. No respiratory distress.     Breath sounds: Normal breath sounds. No stridor. No wheezing, rhonchi or rales.  Chest:     Chest wall: No tenderness.  Abdominal:     General: A surgical scar is present. Bowel sounds are decreased. There is no distension or abdominal bruit. There are no signs of injury.     Palpations: Abdomen is soft. There is mass (hernia). There is no shifting dullness, fluid wave, hepatomegaly or splenomegaly.     Tenderness: There is abdominal tenderness (mild tenderness just at hernia site.).      Hernia: A hernia is present. Hernia is present in the ventral area.     Comments: Large hernia falling to the right deforming abdomen.  Minimally tender.    Skin:    General: Skin is warm and dry.     Capillary Refill: Capillary refill takes 2 to 3 seconds.  Neurological:     General: No focal deficit present.     Mental Status: She is alert and oriented to person, place, and time.  Psychiatric:        Mood and Affect: Mood is anxious. Mood is not depressed.        Behavior: Behavior normal.     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 Acute renal failure. Mild lactic acidosis  Hydrate Continue NGT to LIWS. Medical admission for renal failure.  Will need repair of hernia. Will discuss with DOW for timing.     Milus Height, MD FACS Surgical Oncology, General Surgery, Trauma and [place] [facility], [place] for weekday/non holidays Check [email or web address] for coverage night/weekend/holidays  Do not use SecureChat as it is not reliable for timely patient care.

## 2021-07-22 NOTE — ED Notes (Signed)
Hospital bed ordered for pt and fan provided

## 2021-07-22 NOTE — Progress Notes (Signed)
Date and time results received: 07/22/21 2025 (use smartphrase ".now" to insert current time)  Test: HGB   Critical Value: 5.4  Name of Provider Notified: E-link RN  Orders Received? Or Actions Taken?:  Waiting for orders.

## 2021-07-22 NOTE — Anesthesia Procedure Notes (Addendum)
Arterial Line Insertion Start/End7/07/2021 3:41 PM, 07/22/2021 3:46 PM Performed by: Oleta Mouse, MD, anesthesiologist  Patient location: OR. Preanesthetic checklist: patient identified, IV checked, risks and benefits discussed, surgical consent, monitors and equipment checked, pre-op evaluation, timeout performed and anesthesia consent Left, radial was placed Catheter size: 20 G Hand hygiene performed , maximum sterile barriers used  and Seldinger technique used Allen's test indicative of satisfactory collateral circulation Attempts: 2 Procedure performed using ultrasound guided technique. Ultrasound Notes:anatomy identified, needle tip was noted to be adjacent to the nerve/plexus identified and no ultrasound evidence of intravascular and/or intraneural injection Following insertion, Biopatch and dressing applied. Post procedure assessment: normal  Patient tolerated the procedure well with no immediate complications. Additional procedure comments: [place] image not available.

## 2021-07-22 NOTE — Op Note (Signed)
   Operative Note   Date: 07/22/2021  Procedure: exploratory laparotomy, small bowel resection, adhesiolysis x90 minutes, primary repair of incarcerated ventral hernia, JP drain placement  Pre-op diagnosis: Incarcerated ventral hernia Post-op diagnosis: Incarcerated and strangulated ventral hernia, 12 cm x 12 cm  Indication and clinical history: The patient is a 66 y.o. year old female with a chronically incarcerated ventral hernia  Surgeon: [name], MD Assistant: Grandville Silos, MD; Dema Severin, MD  Anesthesiologist: Ermalene Postin, MD Anesthesia: General  Findings:  Specimen: segment of small bowel x2 EBL: 25cccc Drains/Implants: 63 French JP drain in the subcutaneous tissues  Disposition: ICU - intubated and hemodynamically stable.  Description of procedure: The patient was positioned supine on the operating room table. General anesthetic induction and intubation were uneventful. Foley catheter insertion was performed and was atraumatic. Time-out was performed verifying correct patient, procedure, signature of informed consent, and administration of pre-operative antibiotics. The abdomen was prepped and draped in the usual sterile fashion.  A midline incision was made and deepened until the hernia sac was reached.  The hernia contents were circumferentially dissected from the surrounding tissues.  The hernia sac was ultimately entered revealing ischemic small bowel.  The neck of the hernia was incised to release some of the pressure.  The incarcerated bowel was dissected free of the hernia sac.  The remaining intra-abdominal small bowel was eviscerated revealing a small band that was causing an area of ischemia and stricture as well as a bowel obstruction.  This was freed and was clearly nonviable.  Total time spent performing adhesiolysis was approximately 90 minutes. The additional small bowel that had been in the hernia sac was evaluated and was also found to be viable in parts.  The small bowel  was inspected in its entirety and with the normal small bowel intervening between the two areas of ischemic bowel there was approximately 130 cm of viable bowel.  Without this intervening segment the patient would have been left with 70 cm of small bowel, so the decision was made to perform two anastomoses so as to minimize the risk of short gut syndrome.  Two primary stapled anastomoses were performed after resection of the segments of ischemic bowel.  The resected small bowel was sent as permanent specimen.  The lumens of the anastomoses were each palpated and were confirmed to be widely patent.  The mesenteric defect was closed of each.  The small bowel was returned to the abdominal cavity and the abdominal wall was felt to be able to be reapproximated without tension and due to the need for small bowel resection, permanent mesh was not inserted.  The abdominal wall was closed with #1 PDS suture.  There was a significant amount of subcutaneous dead space, so a JP drain was left in the subcutaneous space.  The skin was closed with staples.  Sterile dressings were applied. All sponge and instrument counts were correct at the conclusion of the procedure. The patient was awakened from anesthesia, extubated uneventfully, and transported to the PACU in good condition. There were no complications.   Upon entering the abdomen (organ space), I encountered ischemic and gangrenous bowel.  CASE DATA:  Type of patient?: DOW CASE (Surgical Hospitalist [facility])  Status of Case? URGENT Add On  Infection Present At Time Of Surgery (PATOS)?   Ischemic and gangrenous bowel    [name], MD General and [place] [facility]

## 2021-07-23 ENCOUNTER — Inpatient Hospital Stay (HOSPITAL_COMMUNITY): Payer: BC Managed Care – PPO

## 2021-07-23 ENCOUNTER — Encounter (HOSPITAL_COMMUNITY): Payer: Self-pay | Admitting: Surgery

## 2021-07-23 DIAGNOSIS — J9600 Acute respiratory failure, unspecified whether with hypoxia or hypercapnia: Secondary | ICD-10-CM | POA: Diagnosis not present

## 2021-07-23 DIAGNOSIS — R011 Cardiac murmur, unspecified: Secondary | ICD-10-CM

## 2021-07-23 DIAGNOSIS — N179 Acute kidney failure, unspecified: Secondary | ICD-10-CM | POA: Diagnosis not present

## 2021-07-23 DIAGNOSIS — R9431 Abnormal electrocardiogram [ECG] [EKG]: Secondary | ICD-10-CM

## 2021-07-23 DIAGNOSIS — R6521 Severe sepsis with septic shock: Secondary | ICD-10-CM

## 2021-07-23 DIAGNOSIS — A419 Sepsis, unspecified organism: Secondary | ICD-10-CM | POA: Diagnosis not present

## 2021-07-23 DIAGNOSIS — K436 Other and unspecified ventral hernia with obstruction, without gangrene: Secondary | ICD-10-CM | POA: Diagnosis not present

## 2021-07-23 LAB — APTT: aPTT: 37 seconds — ABNORMAL HIGH (ref 24–36)

## 2021-07-23 LAB — COMPREHENSIVE METABOLIC PANEL
ALT: 34 U/L (ref 0–44)
AST: 36 U/L (ref 15–41)
Albumin: 2.4 g/dL — ABNORMAL LOW (ref 3.5–5.0)
Alkaline Phosphatase: 36 U/L — ABNORMAL LOW (ref 38–126)
Anion gap: 21 — ABNORMAL HIGH (ref 5–15)
BUN: 86 mg/dL — ABNORMAL HIGH (ref 8–23)
CO2: 18 mmol/L — ABNORMAL LOW (ref 22–32)
Calcium: 6.8 mg/dL — ABNORMAL LOW (ref 8.9–10.3)
Chloride: 105 mmol/L (ref 98–111)
Creatinine, Ser: 6.42 mg/dL — ABNORMAL HIGH (ref 0.44–1.00)
GFR, Estimated: 7 mL/min — ABNORMAL LOW (ref >60)
Glucose, Bld: 102 mg/dL — ABNORMAL HIGH (ref 70–99)
Potassium: 4 mmol/L (ref 3.5–5.1)
Sodium: 144 mmol/L (ref 135–145)
Total Bilirubin: 1 mg/dL (ref 0.3–1.2)
Total Protein: 5 g/dL — ABNORMAL LOW (ref 6.5–8.1)

## 2021-07-23 LAB — CORTISOL: Cortisol, Plasma: 23.9 ug/dL

## 2021-07-23 LAB — CBC WITH DIFFERENTIAL/PLATELET
Abs Immature Granulocytes: 0.63 10*3/uL — ABNORMAL HIGH (ref 0.00–0.07)
Basophils Absolute: 0.1 10*3/uL (ref 0.0–0.1)
Basophils Relative: 1 %
Eosinophils Absolute: 0.1 10*3/uL (ref 0.0–0.5)
Eosinophils Relative: 1 %
HCT: 29.4 % — ABNORMAL LOW (ref 36.0–46.0)
Hemoglobin: 10 g/dL — ABNORMAL LOW (ref 12.0–15.0)
Immature Granulocytes: 6 %
Lymphocytes Relative: 6 %
Lymphs Abs: 0.7 10*3/uL (ref 0.7–4.0)
MCH: 31.7 pg (ref 26.0–34.0)
MCHC: 34 g/dL (ref 30.0–36.0)
MCV: 93.3 fL (ref 80.0–100.0)
Monocytes Absolute: 0.9 10*3/uL (ref 0.1–1.0)
Monocytes Relative: 8 %
Neutro Abs: 9 10*3/uL — ABNORMAL HIGH (ref 1.7–7.7)
Neutrophils Relative %: 78 %
Platelets: 202 10*3/uL (ref 150–400)
RBC: 3.15 MIL/uL — ABNORMAL LOW (ref 3.87–5.11)
RDW: 18.6 % — ABNORMAL HIGH (ref 11.5–15.5)
WBC: 11.5 10*3/uL — ABNORMAL HIGH (ref 4.0–10.5)
nRBC: 0.3 % — ABNORMAL HIGH (ref 0.0–0.2)

## 2021-07-23 LAB — GLUCOSE, CAPILLARY
Glucose-Capillary: 112 mg/dL — ABNORMAL HIGH (ref 70–99)
Glucose-Capillary: 116 mg/dL — ABNORMAL HIGH (ref 70–99)
Glucose-Capillary: 135 mg/dL — ABNORMAL HIGH (ref 70–99)
Glucose-Capillary: 141 mg/dL — ABNORMAL HIGH (ref 70–99)
Glucose-Capillary: 145 mg/dL — ABNORMAL HIGH (ref 70–99)
Glucose-Capillary: 91 mg/dL (ref 70–99)

## 2021-07-23 LAB — PROTIME-INR
INR: 1.8 — ABNORMAL HIGH (ref 0.8–1.2)
Prothrombin Time: 20.8 seconds — ABNORMAL HIGH (ref 11.4–15.2)

## 2021-07-23 LAB — TROPONIN I (HIGH SENSITIVITY)
Troponin I (High Sensitivity): 973 ng/L (ref <18)
Troponin I (High Sensitivity): 892 ng/L (ref <18)

## 2021-07-23 LAB — CBC
HCT: 29.8 % — ABNORMAL LOW (ref 36.0–46.0)
Hemoglobin: 10.2 g/dL — ABNORMAL LOW (ref 12.0–15.0)
MCH: 32.2 pg (ref 26.0–34.0)
MCHC: 34.2 g/dL (ref 30.0–36.0)
MCV: 94 fL (ref 80.0–100.0)
Platelets: 159 10*3/uL (ref 150–400)
RBC: 3.17 MIL/uL — ABNORMAL LOW (ref 3.87–5.11)
RDW: 18.5 % — ABNORMAL HIGH (ref 11.5–15.5)
WBC: 7 10*3/uL (ref 4.0–10.5)
nRBC: 0 % (ref 0.0–0.2)

## 2021-07-23 LAB — ECHOCARDIOGRAM COMPLETE
Area-P 1/2: 2.42 cm2
Height: 61 in
P 1/2 time: 538 msec
Weight: 3440 oz

## 2021-07-23 LAB — LACTIC ACID, PLASMA
Lactic Acid, Venous: 1.3 mmol/L (ref 0.5–1.9)
Lactic Acid, Venous: 1.5 mmol/L (ref 0.5–1.9)
Lactic Acid, Venous: 1.6 mmol/L (ref 0.5–1.9)

## 2021-07-23 LAB — TRIGLYCERIDES: Triglycerides: 376 mg/dL — ABNORMAL HIGH (ref <150)

## 2021-07-23 MED ORDER — VASOPRESSIN 20 UNITS/100 ML INFUSION FOR SHOCK
0.0000 [IU]/min | INTRAVENOUS | Status: DC
  Administered 2021-07-23 (×2): 0.03 [IU]/min via INTRAVENOUS
  Filled 2021-07-23 (×3): qty 100

## 2021-07-23 MED ORDER — NOREPINEPHRINE 16 MG/250ML-% IV SOLN
0.0000 ug/min | INTRAVENOUS | Status: DC
  Administered 2021-07-23: 30 ug/min via INTRAVENOUS
  Filled 2021-07-23: qty 250

## 2021-07-23 MED ORDER — PIPERACILLIN-TAZOBACTAM IN DEX 2-0.25 GM/50ML IV SOLN
2.2500 g | Freq: Three times a day (TID) | INTRAVENOUS | Status: AC
  Administered 2021-07-23 – 2021-07-27 (×13): 2.25 g via INTRAVENOUS
  Filled 2021-07-23 (×16): qty 50

## 2021-07-23 MED ORDER — FENTANYL CITRATE (PF) 100 MCG/2ML IJ SOLN
25.0000 ug | INTRAMUSCULAR | Status: DC | PRN
  Administered 2021-07-23 (×2): 50 ug via INTRAVENOUS
  Administered 2021-07-23 – 2021-07-25 (×8): 100 ug via INTRAVENOUS
  Administered 2021-07-25: 50 ug via INTRAVENOUS
  Administered 2021-07-25 – 2021-07-26 (×2): 100 ug via INTRAVENOUS
  Filled 2021-07-23 (×13): qty 2

## 2021-07-23 MED ORDER — HYDROCORTISONE SOD SUC (PF) 100 MG IJ SOLR
100.0000 mg | Freq: Once | INTRAMUSCULAR | Status: AC
Start: 2021-07-23 — End: 2021-07-23
  Administered 2021-07-23: 100 mg via INTRAVENOUS
  Filled 2021-07-23: qty 2

## 2021-07-23 MED ORDER — FENTANYL CITRATE (PF) 100 MCG/2ML IJ SOLN: 25.0000 ug | INTRAMUSCULAR | Status: DC | PRN

## 2021-07-23 MED ORDER — PERFLUTREN LIPID MICROSPHERE
1.0000 mL | INTRAVENOUS | Status: AC | PRN
  Administered 2021-07-23: 3 mL via INTRAVENOUS

## 2021-07-23 MED ORDER — HYDROCORTISONE SOD SUC (PF) 100 MG IJ SOLR
100.0000 mg | Freq: Two times a day (BID) | INTRAMUSCULAR | Status: DC
  Administered 2021-07-23: 100 mg via INTRAVENOUS
  Filled 2021-07-23 (×2): qty 2

## 2021-07-23 NOTE — Consult Note (Signed)
Cardiology Consultation:  Patient ID: [name] MRN: [id number]; DOB: [birth year]  Admit date: 07/21/2021 Date of Consult: 07/23/2021  Primary Care Provider: Mancel Bale, PA-C Primary Cardiologist: None  Primary Electrophysiologist:  None   Patient Profile:  [name] is a 66 y.o. female with a hx of hypertension, hyperlipidemia who is being seen today for the evaluation of elevated troponin/shock at the request of Virginia Mires, MD.  History of Present Illness:  [name] was admitted on 07/22/2021 with septic shock secondary to incarcerated ventral wall hernia.  She was admitted with profound lactic acidosis and acute renal failure.  She was taken emergently to the OR on 07/22/2021.  She has remained on pressors in the ICU.  She is currently intubated.  She cannot provide history but does follow commands.  Laboratory data on arrival to the hospital was notable for creatinine of 5.28 which is new for her.  She had a profound lactic acidosis of 6.5.  Leukocytosis 11.6.  Hemoglobin has been stable.  She was taken for exploratory laparotomy with small bowel resection and lysis of adhesions.  She currently is on Levophed and vasopressin.  She was having ongoing pressor requirements.  Cardiac enzymes were checked.  High-sensitivity troponin 973 and 892 on repeat.  Lactic acid has normalized.  EKG shows no acute ischemic changes.  Echocardiogram is normal with normal LV function.  She has a murmur which is likely related to hyperdynamic LV function.  She is unable to provide any symptoms.  Her sister is at the bedside.  We discussed that this appears to be secondary to septic shock and not a primary cardiac issue.  Heart Pathway Score:       Past Medical History: Past Medical History:  Diagnosis Date   Abdominal distention    Abdominal pain    Arthritis    Blood transfusion without reported diagnosis    Hernia    Hyperlipidemia    Hypertension    Leg swelling    Strangulated recurrent  ventral incisional hernia, s/p small intestine resection 09/22/2010    Past Surgical History: Past Surgical History:  Procedure Laterality Date   ABDOMINAL HYSTERECTOMY     bowel obstruction  09/12/2010   HERNIA REPAIR  2007   incarcerated, open with biologic mesh   HERNIA REPAIR  Aug2012   strangulated, redo open with biologic mesh   INGUINAL HERNIA REPAIR N/A 07/22/2021   Procedure: HERNIA REPAIR VENTRAL INCARCERATED;  Surgeon: [name], MD;  Location: [facility];  Service: General;  Laterality: N/A;   LAPAROTOMY N/A 07/22/2021   Procedure: EXPLORATORY LAPAROTOMY;  Surgeon: [name], MD;  Location: [facility];  Service: General;  Laterality: N/A;   SMALL INTESTINE SURGERY  09/12/10   SBR in incarcerated [place]     Home Medications:  Prior to Admission medications   Medication Sig Start Date End Date Taking? Authorizing Provider  amLODipine (NORVASC) 10 MG tablet TAKE 1 TABLET(10 MG) BY MOUTH DAILY 07/11/17  Yes Weber, Damaris Hippo, PA-C    Inpatient Medications: Scheduled Meds:  Chlorhexidine Gluconate Cloth  6 each Topical Daily   heparin injection (subcutaneous)  5,000 Units Subcutaneous Q8H   mouth rinse  15 mL Mouth Rinse Q2H   pantoprazole (PROTONIX) IV  40 mg Intravenous Q12H   sodium chloride flush  10-40 mL Intracatheter Q12H   sodium chloride flush  3 mL Intravenous Q12H   Continuous Infusions:  sodium chloride 150 mL/hr at 07/23/21 1115   norepinephrine (LEVOPHED) Adult infusion  26 mcg/min (07/23/21 1115)   piperacillin-tazobactam (ZOSYN)  IV     propofol (DIPRIVAN) infusion Stopped (07/23/21 0540)   sodium chloride     vasopressin 0.03 Units/min (07/23/21 1115)   PRN Meds: albuterol, fentaNYL (SUBLIMAZE) injection, fentaNYL (SUBLIMAZE) injection, [DISCONTINUED] ondansetron **OR** ondansetron (ZOFRAN) IV, mouth rinse, perflutren lipid microspheres (DEFINITY) IV suspension, sodium chloride flush  Allergies:    Allergies  Allergen Reactions   Lisinopril Cough     Social History:   Social History   Socioeconomic History   Marital status: Widowed    Spouse name: Not on file   Number of children: Not on file   Years of education: Not on file   Highest education level: Not on file  Occupational History   Not on file  Tobacco Use   Smoking status: Never   Smokeless tobacco: Never  Vaping Use   Vaping Use: Never used  Substance and Sexual Activity   Alcohol use: No   Drug use: No   Sexual activity: Never    Birth control/protection: Abstinence  Other Topics Concern   Not on file  Social History Narrative   Not on file   Social Determinants of Health   Financial Resource Strain: Not on file  Food Insecurity: Not on file  Transportation Needs: Not on file  Physical Activity: Not on file  Stress: Not on file  Social Connections: Not on file  Intimate Partner Violence: Not on file     Family History:    Family History  Problem Relation Age of Onset   Heart disease Mother        heart attack    Heart disease Father        heart attack    Hypertension Sister    Breast cancer Sister      ROS:  All other ROS reviewed and negative. Pertinent positives noted in the HPI.     Physical Exam/Data:   Vitals:   07/23/21 1058 07/23/21 1100 07/23/21 1115 07/23/21 1200  BP: (!) 164/72 (!) 157/78  (!) 149/51  Pulse: 81 84 78 79  Resp: 17 18 17  (!) 0  Temp:      TempSrc:      SpO2: 100% 99% 100% 100%  Weight:      Height:        Intake/Output Summary (Last 24 hours) at 07/23/2021 1221 Last data filed at 07/23/2021 1115 Gross per 24 hour  Intake 6635.58 ml  Output 880 ml  Net 5755.58 ml       07/21/2021    7:23 PM 09/13/2017    8:12 AM 08/13/2017    8:22 AM  Last 3 Weights  Weight (lbs) 215 lb 215 lb 9.6 oz 217 lb  Weight (kg) 97.523 kg 97.796 kg 98.431 kg    Body mass index is 40.62 kg/m.  General: Intubated and sedated, ill-appearing Head: Atraumatic, normal size  Eyes: PEERLA, EOMI  Neck: Supple, no JVD Endocrine: No  thryomegaly Cardiac: Normal S1, S2; RRR; 2 out of 6 systolic ejection murmur Lungs: Clear to auscultation bilaterally, no wheezing, rhonchi or rales  Abd: JP drain in place Ext: No edema, pulses 2+, warm extremities Musculoskeletal: No deformities, BUE and BLE strength normal and equal Skin: Warm and dry, no rashes   Neuro: Intubated on vent, will wake up and follow commands  EKG:  The EKG was personally reviewed and demonstrates: Sinus rhythm heart rate 100, no acute ischemic changes Telemetry:  Telemetry was personally reviewed and demonstrates: Sinus  rhythm 90 to 100 bpm  Relevant CV Studies: TTE 07/22/2021  1. Mid-systolic obliteration of the LV cavity is noted. Left ventricular  ejection fraction, by estimation, is >75%. The left ventricle has  hyperdynamic function. The left ventricle has no regional wall motion  abnormalities. There is mild left  ventricular hypertrophy. Left ventricular diastolic parameters are  consistent with Grade I diastolic dysfunction (impaired relaxation).   2. Right ventricular systolic function is hyperdynamic. The right  ventricular size is not well visualized.   3. The mitral valve is grossly normal. No evidence of mitral valve  regurgitation.   4. The aortic valve was not well visualized. Aortic valve regurgitation  is mild. Aortic valve sclerosis/calcification is present, without any  evidence of aortic stenosis.   5. The inferior vena cava is normal in size with greater than 50%  respiratory variability, suggesting right atrial pressure of 3 mmHg.  Laboratory Data: High Sensitivity Troponin:   Recent Labs  Lab 07/23/21 0929  TROPONINIHS 973*     Cardiac EnzymesNo results for input(s): "TROPONINI" in the last 168 hours. No results for input(s): "TROPIPOC" in the last 168 hours.  Chemistry Recent Labs  Lab 07/22/21 0246 07/22/21 1620 07/22/21 2040 07/23/21 0319  NA 143 140 144 144  K 3.4* 4.3 3.7 4.0  CL 92*  --  103 105  CO2 28  --   20* 18*  GLUCOSE 109*  --  104* 102*  BUN 81*  --  85* 86*  CREATININE 5.69*  --  6.02* 6.42*  CALCIUM 8.6*  --  6.6* 6.8*  GFRNONAA 8*  --  7* 7*  ANIONGAP 23*  --  21* 21*    Recent Labs  Lab 07/21/21 2056 07/23/21 0319  PROT 6.0* 5.0*  ALBUMIN 2.7* 2.4*  AST 27 36  ALT 17 34  ALKPHOS 56 36*  BILITOT 0.6 1.0   Hematology Recent Labs  Lab 07/22/21 1945 07/22/21 2128 07/23/21 0319 07/23/21 0919  WBC 2.7*  --  7.0 11.5*  RBC 1.68*  --  3.17* 3.15*  HGB 5.4* 10.0* 10.2* 10.0*  HCT 16.1* 29.3* 29.8* 29.4*  MCV 95.8  --  94.0 93.3  MCH 32.1  --  32.2 31.7  MCHC 33.5  --  34.2 34.0  RDW 18.3*  --  18.5* 18.6*  PLT 82*  --  159 202   BNPNo results for input(s): "BNP", "PROBNP" in the last 168 hours.  DDimer No results for input(s): "DDIMER" in the last 168 hours.  Radiology/Studies:  ECHOCARDIOGRAM COMPLETE  Result Date: 07/23/2021    ECHOCARDIOGRAM REPORT   Patient Name:   Xavia Kniskern Date of Exam: 07/23/2021 Medical Rec #:  [id number]       Height:       61.0 in Accession #:    [id number]      Weight:       215.0 lb Date of Birth:  [birth year]       BSA:          1.948 m Patient Age:    41 years        BP:           110/61 mmHg Patient Gender: F               HR:           82 bpm. Exam Location:  Inpatient Procedure: 2D Echo, Cardiac Doppler, Color Doppler and Intracardiac  Opacification Agent Indications:    Abnormal ECG R94.31                 Murmur R01.1  History:        Patient has no prior history of Echocardiogram examinations.                 Risk Factors:Hypertension and Dyslipidemia.  Sonographer:    Darlina Sicilian RDCS Referring Phys: Idamae Schuller IMPRESSIONS  1. Mid-systolic obliteration of the LV cavity is noted. Left ventricular ejection fraction, by estimation, is >75%. The left ventricle has hyperdynamic function. The left ventricle has no regional wall motion abnormalities. There is mild left ventricular hypertrophy. Left ventricular diastolic parameters  are consistent with Grade I diastolic dysfunction (impaired relaxation).  2. Right ventricular systolic function is hyperdynamic. The right ventricular size is not well visualized.  3. The mitral valve is grossly normal. No evidence of mitral valve regurgitation.  4. The aortic valve was not well visualized. Aortic valve regurgitation is mild. Aortic valve sclerosis/calcification is present, without any evidence of aortic stenosis.  5. The inferior vena cava is normal in size with greater than 50% respiratory variability, suggesting right atrial pressure of 3 mmHg. Comparison(s): No prior Echocardiogram. FINDINGS  Left Ventricle: Mid-systolic obliteration of the LV cavity is noted. Left ventricular ejection fraction, by estimation, is >75%. The left ventricle has hyperdynamic function. The left ventricle has no regional wall motion abnormalities. Definity contrast agent was given IV to delineate the left ventricular endocardial borders. The left ventricular internal cavity size was normal in size. There is mild left ventricular hypertrophy. Left ventricular diastolic parameters are consistent with Grade I  diastolic dysfunction (impaired relaxation). Indeterminate filling pressures. Right Ventricle: The right ventricular size is not well visualized. Right vetricular wall thickness was not well visualized. Right ventricular systolic function is hyperdynamic. Left Atrium: Left atrial size was normal in size. Right Atrium: Right atrial size was normal in size. Pericardium: There is no evidence of pericardial effusion. Mitral Valve: The mitral valve is grossly normal. No evidence of mitral valve regurgitation. Tricuspid Valve: The tricuspid valve is not well visualized. Tricuspid valve regurgitation is not demonstrated. Aortic Valve: The aortic valve was not well visualized. Aortic valve regurgitation is mild. Aortic regurgitation PHT measures 538 msec. Aortic valve sclerosis/calcification is present, without any  evidence of aortic stenosis. Pulmonic Valve: The pulmonic valve was not well visualized. Pulmonic valve regurgitation is not visualized. Aorta: Aortic root could not be assessed. Venous: The inferior vena cava is normal in size with greater than 50% respiratory variability, suggesting right atrial pressure of 3 mmHg. IAS/Shunts: The interatrial septum was not well visualized.   Diastology LV e' medial:    5.00 cm/s LV E/e' medial:  12.7 LV e' lateral:   4.79 cm/s LV E/e' lateral: 13.2  RIGHT VENTRICLE RV S prime:     24.40 cm/s TAPSE (M-mode): 1.5 cm LEFT ATRIUM             Index        RIGHT ATRIUM           Index LA Vol (A2C):   39.6 ml 20.33 ml/m  RA Area:     11.50 cm LA Vol (A4C):   47.3 ml 24.28 ml/m  RA Volume:   24.10 ml  12.37 ml/m LA Biplane Vol: 43.8 ml 22.49 ml/m  AORTIC VALVE AI PHT:      538 msec MITRAL VALVE MV Area (PHT): 2.42 cm MV Decel Time:  314 msec MV E velocity: 63.40 cm/s MV A velocity: 94.30 cm/s MV E/A ratio:  0.67 Lyman Bishop MD Electronically signed by Lyman Bishop MD Signature Date/Time: 07/23/2021/11:44:42 AM    Final    DG CHEST PORT 1 VIEW  Result Date: 07/23/2021 CLINICAL DATA:  Intubated EXAM: PORTABLE CHEST 1 VIEW COMPARISON:  Radiograph 07/22/2021 FINDINGS: Endotracheal tube overlies the midthoracic trachea approximately 2.7 cm above the carina. Right neck catheter tip overlies the distal superior vena cava. Orogastric tube tip and side port overlie the stomach. Unchanged cardiomediastinal silhouette. Unchanged low lung volumes and bibasilar opacities favored to be atelectasis. Haziness of the right hemithorax likely due to radiographic technique. No definite new airspace disease. Right basilar subsegmental atelectasis. No large pleural effusion. No pneumothorax. Bones are unchanged. IMPRESSION: Unchanged low lung volumes and bibasilar opacities favored to be atelectasis. Endotracheal tube tip overlies the midthoracic trachea approximately 2.7 cm above the carina.  Electronically Signed   By: Maurine Simmering M.D.   On: 07/23/2021 09:10   DG CHEST PORT 1 VIEW  Result Date: 07/22/2021 CLINICAL DATA:  Intubated and on ventilator. EXAM: PORTABLE CHEST 1 VIEW COMPARISON:  Radiograph yesterday. FINDINGS: Endotracheal tube tip at the level of the clavicular heads. Tip and side port of the enteric tube below the diaphragm in the stomach. There is a new right internal jugular large-bore catheter with tip overlying the lower SVC. No pneumothorax. Lower lung volumes from prior exam with bibasilar volume loss. Streaky atelectasis in the right greater than left lung base stable heart size and mediastinal contours. IMPRESSION: 1. Endotracheal tube tip at the clavicular heads. Enteric tube tip and side-port below the diaphragm in the stomach. Right internal jugular catheter with tip overlying the lower SVC. No pneumothorax. 2. Lower lung volumes with bibasilar volume loss and streaky atelectasis. Electronically Signed   By: Keith Rake M.D.   On: 07/22/2021 19:10   DG Abdomen 1 View  Result Date: 07/22/2021 CLINICAL DATA:  Check gastric catheter placement EXAM: ABDOMEN - 1 VIEW COMPARISON:  CT from the previous day. FINDINGS: Gastric catheter is been advanced into the stomach. No free air is seen. The known small bowel obstruction is not well appreciated on this exam. IMPRESSION: Gastric catheter within the stomach. Electronically Signed   By: Inez Catalina M.D.   On: 07/22/2021 02:31   CT ABDOMEN PELVIS WO CONTRAST  Result Date: 07/22/2021 CLINICAL DATA:  Abdominal pain, acute.  Nausea and vomiting. EXAM: CT ABDOMEN AND PELVIS WITHOUT CONTRAST TECHNIQUE: Multidetector CT imaging of the abdomen and pelvis was performed following the standard protocol without IV contrast. RADIATION DOSE REDUCTION: This exam was performed according to the departmental dose-optimization program which includes automated exposure control, adjustment of the mA and/or kV according to patient size and/or  use of iterative reconstruction technique. COMPARISON:  CT 09/13/2019 FINDINGS: Lower chest: Ill-defined opacities in the right lower and middle lobe favoring atelectasis. No pleural fluid. Hepatobiliary: No focal hepatic abnormality on this unenhanced exam. Layering gallstones and probable sludge. No pericholecystic inflammation or biliary dilatation. Pancreas: Pancreatic atrophy.  No ductal dilatation or inflammation. Spleen: Punctate granuloma.  Normal in size. Adrenals/Urinary Tract: No adrenal nodule. Mild bilateral renal parenchymal atrophy. No hydronephrosis. Small cyst in the lower left kidney, needing no further follow-up. No renal calculi. Unremarkable urinary bladder. Stomach/Bowel: Large complex lower ventral abdominal wall hernia, with hernia contents deviating to the right. Majority of the small bowel is located within this ventral abdominal wall hernia. Hernia contains multiple loops of  small bowel. Small bowel obstruction with dilated fluid-filled small bowel. The stomach is prominently distended with fluid. There is also fluid distending the included distal esophagus. Small bowel transition point right aspect of the ventral hernia, for example series 4, image 44 and series 6, image 48. Multiple enteric sutures are noted within small bowel in the hernia sac. Multifocal colonic diverticulosis without focal diverticulitis. There is mild mesenteric edema in the small bowel without small bowel pneumatosis. Vascular/Lymphatic: Aortic atherosclerosis. No aortic aneurysm. No portal venous or mesenteric gas. No bulky abdominopelvic adenopathy. Reproductive: Electronic records lists history of hysterectomy, however in atrophic uterus is visualized in the pelvis. No adnexal mass. Other: No ascites or free air. Mesenteric edema and stranding involving small bowel both within the ventral abdominal wall hernia at and just proximal to. Musculoskeletal: Scoliosis and degenerative change in the spine. There are no  acute or suspicious osseous abnormalities. IMPRESSION: 1. Small bowel obstruction. There is a large lower abdominal ventral abdominal wall hernia containing majority of the small bowel, the transition point is located within this ventral abdominal wall hernia. 2. Colonic diverticulosis without focal diverticulitis. 3. Cholelithiasis without acute inflammation. Aortic Atherosclerosis (ICD10-I70.0). Electronically Signed   By: Keith Rake M.D.   On: 07/22/2021 00:20   DG Chest Portable 1 View  Result Date: 07/21/2021 CLINICAL DATA:  Altered mental status and hypotension EXAM: PORTABLE CHEST 1 VIEW COMPARISON:  01/12/2016 FINDINGS: The heart size and mediastinal contours are within normal limits. Both lungs are clear. The visualized skeletal structures are unremarkable. IMPRESSION: No active disease. Electronically Signed   By: Inez Catalina M.D.   On: 07/21/2021 20:27    Assessment and Plan:   #Septic shock #Acute renal failure #Acute proximal respiratory failure #Demand ischemia secondary to septic shock -Admitted with incarcerated ventral hernia.  Status postrepair.  Admitted with severe lactic acidosis as well as acute renal failure.  Septic shock.  On antibiotics.  Currently in the ICU.  With ongoing pressor requirements troponins were checked.  These are minimally elevated and flat.  Her EKG is normal.  Her echocardiogram shows normal wall motion. -Troponins are elevated secondary to critical illness.  This is not acute coronary syndrome.  Her EF is normal.  There is no cardiac explanation for her ongoing shock other than sepsis.  Would recommend against aspirin or any other agents given critical illness and recent surgery.  We will just treat this supportively. -Cardiology will sign off at this time.  Please let us know if you have questions or concerns.  [facility] will sign off.   Medication Recommendations: Supportive care as above Other recommendations (labs, testing, etc):  None. Follow up as an outpatient: None needed.  We will continue to treat her as you are doing.  For questions or updates, please contact [facility] Please consult [email or web address] for contact info under   Signed, [place] T. Audie Box, MD, [place]  07/23/2021 12:21 PM

## 2021-07-23 NOTE — Progress Notes (Signed)
NAMEAmiyrah Myers, MRN:  [id number], DOB:  1955-05-07, LOS: 1 ADMISSION DATE:  07/21/2021, CONSULTATION DATE:  07/22/21 REFERRING MD:  EDP, CHIEF COMPLAINT:  Abdominal Pain   History of Present Illness:  [name] is a 66 y.o. 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  She presented to the ED with severe abdominal pain and vomiting on 7/7.  Labs were significant for Creatinine of 5.28, lactic acid 6.5, WBC 11.6 and CT abd/pelvis with SBO, large lower abdominal ventral wall hernia containing most of the small bowel.    Surgery was consulted and she was taken to the OR for exlap and hernia repair.  Post-op pt was transferred to the ICU and PCCM consulted.  Pertinent  Medical History  Pt has a past medical history of Abdominal distention, Abdominal pain, Arthritis, Blood transfusion without reported diagnosis, Hernia, Hyperlipidemia, Hypertension, Leg swelling, and Strangulated recurrent ventral incisional hernia, s/p small intestine resection (09/22/2010). Significant Hospital Events: Including procedures, antibiotic start and stop dates in addition to other pertinent events   07/07-presented to the ED with abdominal pain and SBO, to OR for ex-lap, txfr to ICU  Interim History / Subjective:  Patient is intubated and sedated but arouses to voice and follows command. She is having pain in her abdomen.   Review of Systems:   Intubated and sedated.  Objective afebrile, BP>65, HR <100 but was high in 140s with ST, intubated.   Blood pressure 102/65, pulse 97, temperature 98.4 F (36.9 C), temperature source Axillary, resp. rate 19, height 5\' 1"  (1.549 m), weight 97.5 kg, SpO2 100 %.  On 50 %FiO2.     Vent Mode: PRVC FiO2 (%):  [60 %-100 %] 60 % Set Rate:  [18 bmp] 18 bmp Vt Set:  [380 mL] 380 mL PEEP:  [5 cmH20] 5 cmH20 Plateau Pressure:   [16 cmH20-18 cmH20] 17 cmH20   Intake/Output Summary (Last 24 hours) at 07/23/2021 0736 Last data filed at 07/23/2021 0400 Gross per 24 hour  Intake 4949.43 ml  Output 880 ml  Net 4069.43 ml   Filed Weights   07/21/21 1923  Weight: 97.5 kg   Examination: General: NAD laying in bed intubated and sedated HENT: intubated, eyes with arcus sinilis Lungs: ventilated lung sounds Cardiovascular: NSR with premature beats, murmur present (new) Abdomen: bowel sounds present. TTP of abdomen, dressing present on abdomen Extremities: No LE edema Neuro: Unable to fully assess GU: foley present  [facility] Problem list    Assessment & Plan:  Acute Small Bowel Obstruction  Septic vs Cardiogenic Shock Patient has strangulated hernia. Patient was taken to OR for ex-lap and is s/p bowel resection with anastomosis. Patient is on pressures and has been having increasing requirement of her pressors to maintain MAP>65. New murmur present on cardiac exam, along with worsening pressor requirement makes a cardiac event more likely. Her CV risk factors include HTN, HLD, and advanced age. Her HLD appears to be uncontrolled. No other cardiac studies present. Troponin obtained and it is elevated at 973. EKG this morning did not show any ischemic changes. This could be demand ischemia 2/2 to her hypotension. Lactic acid was elevated but has resolved with increased pressors. Echo ordered. Will consult cardiology as to work up further.  -post-op plan per surgery team; monitor drains, continue NGT to suction, 4 days of zosyn.  -  pain control -continue Zosyn 2.25 q8hrs -Continue pressors as needed to maintain MAP >65. Currently on levo and vasopressin -Trend troponin, follow up on echo, and follow up on cardiology recommendations.   Post-op Ventilator management -Maintain full vent support with SAT/SBT as tolerated -titrate Vent setting to maintain SpO2 greater than or  equal to 90%. -HOB elevated 30 degrees. -Plateau pressures less than 30 cm H20.  -Follow chest x-ray, ABG prn.   -Bronchial hygiene and RT/bronchodilator protocol.   Acute on Chronic Kidney Injury Hypokalemia NAGMA CKD stage IIIb. Baseline creatinine 1.1-1.3, 5.28 on admission, slightly worse this am. Likely pre-renal secondary to volume depletion and hypoperfusion (see problem 1).  -monitor renal indices, UOP and electrolytes -Continue IVF -Keep MAP>65 as above.  -monitor and replete electrolyte as needed  -follow BMP  Class II Obesity -Counsel on lifestyle modifications when patient is stable.   Best Practice (right click and "Reselect all SmartList Selections" daily)  Diet/type: NPO DVT prophylaxis: SCD GI prophylaxis: PPI Lines: yes and it is still needed Foley:  Yes, and it is still needed Continuous: 150 cc/hr NaCl, 26 mcg of Levophed, vasopressin.  Code Status:  full code Last date of multidisciplinary goals of care discussion [07/08] Labs CBC shows resolved leukocystosis, Hgb stable at 10. Erroneous value at 5.4 with repeat normal. CMP shows normal K, bicarb 18, Creatinine at 6.42, calcium 6.8 albumin 2.4, 8.1 corrected. LFTs normal. Triglyceride 376. INR at 1.8. Bcx NGTD. LA initially 6.5 and down-trended to normal. NGTD on Bcx  CBC: Recent Labs  Lab 07/21/21 2056 07/22/21 1620 07/22/21 1945 07/22/21 2128 07/23/21 0319  WBC 11.6*  --  2.7*  --  7.0  NEUTROABS 9.0*  --   --   --   --   HGB 13.2 11.2* 5.4* 10.0* 10.2*  HCT 40.8 33.0* 16.1* 29.3* 29.8*  MCV 96.7  --  95.8  --  94.0  PLT 272  --  82*  --  545   Basic Metabolic Panel: Recent Labs  Lab 07/21/21 2056 07/22/21 0246 07/22/21 1620 07/22/21 2040 07/23/21 0319  NA 143 143 140 144 144  K 2.9* 3.4* 4.3 3.7 4.0  CL 94* 92*  --  103 105  CO2 27 28  --  20* 18*  GLUCOSE 102* 109*  --  104* 102*  BUN 74* 81*  --  85* 86*  CREATININE 5.28* 5.69*  --  6.02* 6.42*  CALCIUM 8.7* 8.6*  --  6.6* 6.8*  MG  2.1  --   --   --   --    GFR: Estimated Creatinine Clearance: 9.1 mL/min (A) (by C-G formula based on SCr of 6.42 mg/dL (H)). Recent Labs  Lab 07/21/21 2056 07/21/21 2345 07/22/21 1030 07/22/21 1414 07/22/21 1945 07/22/21 2346 07/23/21 0319  WBC 11.6*  --   --   --  2.7*  --  7.0  LATICACIDVEN 6.5*   < > 3.4* 3.2* 1.5 1.3  --    < > = values in this interval not displayed.   Liver Function Tests: Recent Labs  Lab 07/21/21 2056 07/23/21 0319  AST 27 36  ALT 17 34  ALKPHOS 56 36*  BILITOT 0.6 1.0  PROT 6.0* 5.0*  ALBUMIN 2.7* 2.4*   Recent Labs  Lab 07/21/21 2056  LIPASE 24   No results for input(s): "AMMONIA" in the last 168 hours. ABG    Component Value Date/Time   PHART 7.421 07/22/2021 1620   PCO2ART 39.7 07/22/2021 1620  PO2ART 390 (H) 07/22/2021 1620   HCO3 25.8 07/22/2021 1620   TCO2 27 07/22/2021 1620   O2SAT 100 07/22/2021 1620    Coagulation Profile: Recent Labs  Lab 07/23/21 0527  INR 1.8*   Cardiac Enzymes: Recent Labs  Lab 07/22/21 0246  CKTOTAL 304*   HbA1C: Hemoglobin A1C  Date/Time Value Ref Range Status  09/17/2013 03:45 PM 5.9  Final   Hgb A1c MFr Bld  Date/Time Value Ref Range Status  11/21/2016 04:39 PM 5.9 (H) 4.8 - 5.6 % Final    Comment:             Prediabetes: 5.7 - 6.4          Diabetes: >6.4          Glycemic control for adults with diabetes: <7.0    CBG: Recent Labs  Lab 07/22/21 1849 07/22/21 1949 07/22/21 2356 07/23/21 0357  GLUCAP 71 77 80 91   Past Medical History:  She,  has a past medical history of Abdominal distention, Abdominal pain, Arthritis, Blood transfusion without reported diagnosis, Hernia, Hyperlipidemia, Hypertension, Leg swelling, and Strangulated recurrent ventral incisional hernia, s/p small intestine resection (09/22/2010).  Surgical History:   Past Surgical History:  Procedure Laterality Date   ABDOMINAL HYSTERECTOMY     bowel obstruction  09/12/2010   HERNIA REPAIR  2007    incarcerated, open with biologic mesh   HERNIA REPAIR  Aug2012   strangulated, redo open with biologic mesh   SMALL INTESTINE SURGERY  09/12/10   SBR in incarcerated [place]    Social History:   reports that she has never smoked. She has never used smokeless tobacco. She reports that she does not drink alcohol and does not use drugs.  Family History:  Her family history includes Breast cancer in her sister; Heart disease in her father and mother; Hypertension in her sister.  Allergies Allergies  Allergen Reactions   Lisinopril Cough    Home Medications  Prior to Admission medications   Medication Sig Start Date End Date Taking? Authorizing Provider  amLODipine (NORVASC) 10 MG tablet TAKE 1 TABLET(10 MG) BY MOUTH DAILY 07/11/17  Yes Weber, Sarah L, PA-C  amoxicillin-clavulanate (AUGMENTIN) 875-125 MG tablet Take 1 tablet by mouth every 12 (twelve) hours. Patient not taking: Reported on 07/22/2021 09/13/19   Joy, Raquel Sarna C, PA-C  ibuprofen (ADVIL) 800 MG tablet TAKE 1 TABLET(800 MG) BY MOUTH EVERY 8 HOURS AS NEEDED Patient not taking: Reported on 07/22/2021 07/29/18   Aundra Dubin, PA-C  methocarbamol (ROBAXIN) 500 MG tablet Take 1 tablet (500 mg total) by mouth 2 (two) times daily as needed. Patient not taking: Reported on 07/22/2021 01/06/19   Aundra Dubin, PA-C  ondansetron (ZOFRAN ODT) 4 MG disintegrating tablet Take 1 tablet (4 mg total) by mouth every 8 (eight) hours as needed for nausea or vomiting. Patient not taking: Reported on 07/22/2021 09/13/19   Lorayne Bender, PA-C    Idamae Schuller, MD [facility], PGY-2

## 2021-07-23 NOTE — Progress Notes (Signed)
Patient ID: Rupa Lagan, female   DOB: 09-16-1955, 66 y.o.   MRN: [id number] [facility] Surgery Progress Note  1 Day Post-Op  Subjective: CC-  Increasing pressor requirements this morning, Levo 24. Vent settings stable 50% and 5 of PEEP Some but low UOP over night, 80cc. Cr up 6.42 Awake on the vent. Complaining of some abdominal pain.  Objective: Vital signs in last 24 hours: Temp:  [97.3 F (36.3 C)-98.4 F (36.9 C)] 98.4 F (36.9 C) (07/08 0801) Pulse Rate:  [79-149] 79 (07/08 0945) Resp:  [0-31] 19 (07/08 0945) BP: (88-113)/(49-73) 113/52 (07/08 0801) SpO2:  [85 %-100 %] 100 % (07/08 0945) Arterial Line BP: (81-146)/(43-62) 146/58 (07/08 0945) FiO2 (%):  [50 %-100 %] 50 % (07/08 0801) Last BM Date :  (pta)  Intake/Output from previous day: 07/07 0701 - 07/08 0700 In: 4949.4 [I.V.:3943.1; IV Piggyback:1006.3] Out: 880 [Urine:80; Emesis/NG output:650; Blood:150] Intake/Output this shift: Total I/O In: 1113.9 [I.V.:1113.9] Out: -   PE: Gen:  Alert, NAD Card:  irregular, HR 80s Pulm:  mechanically ventilated Abd: soft, diffuse tenderness without rebound or guarding, no peritonitis, JP serosanguinous. Honeycomb dressing with small amount of blood at distal aspect. Rash vs ecchymosis noted on proximal thighs and intramammary area GU: foley with clear yellow urine  Vent:  Vent Mode: PRVC FiO2 (%):  [50 %-100 %] 50 % Set Rate:  [18 bmp] 18 bmp Vt Set:  [380 mL] 380 mL PEEP:  [5 cmH20] 5 cmH20 Plateau Pressure:  [16 cmH20-18 cmH20] 17 cmH20  Lab Results:  Recent Labs    07/23/21 0319 07/23/21 0919  WBC 7.0 11.5*  HGB 10.2* 10.0*  HCT 29.8* 29.4*  PLT 159 202   BMET Recent Labs    07/22/21 2040 07/23/21 0319  NA 144 144  K 3.7 4.0  CL 103 105  CO2 20* 18*  GLUCOSE 104* 102*  BUN 85* 86*  CREATININE 6.02* 6.42*  CALCIUM 6.6* 6.8*   PT/INR Recent Labs    07/23/21 0527  LABPROT 20.8*  INR 1.8*   CMP     Component Value Date/Time   NA  144 07/23/2021 0319   NA 146 (H) 07/11/2017 1547   K 4.0 07/23/2021 0319   CL 105 07/23/2021 0319   CO2 18 (L) 07/23/2021 0319   GLUCOSE 102 (H) 07/23/2021 0319   BUN 86 (H) 07/23/2021 0319   BUN 14 07/11/2017 1547   CREATININE 6.42 (H) 07/23/2021 0319   CREATININE 1.04 05/06/2014 1538   CALCIUM 6.8 (L) 07/23/2021 0319   PROT 5.0 (L) 07/23/2021 0319   PROT 6.1 07/11/2017 1547   ALBUMIN 2.4 (L) 07/23/2021 0319   ALBUMIN 3.5 (L) 07/11/2017 1547   AST 36 07/23/2021 0319   ALT 34 07/23/2021 0319   ALKPHOS 36 (L) 07/23/2021 0319   BILITOT 1.0 07/23/2021 0319   BILITOT <0.2 07/11/2017 1547   GFRNONAA 7 (L) 07/23/2021 0319   GFRAA 50 (L) 09/13/2019 0755   Lipase     Component Value Date/Time   LIPASE 24 07/21/2021 2056       Studies/Results: DG CHEST PORT 1 VIEW  Result Date: 07/23/2021 CLINICAL DATA:  Intubated EXAM: PORTABLE CHEST 1 VIEW COMPARISON:  Radiograph 07/22/2021 FINDINGS: Endotracheal tube overlies the midthoracic trachea approximately 2.7 cm above the carina. Right neck catheter tip overlies the distal superior vena cava. Orogastric tube tip and side port overlie the stomach. Unchanged cardiomediastinal silhouette. Unchanged low lung volumes and bibasilar opacities favored to be atelectasis. Haziness of the  right hemithorax likely due to radiographic technique. No definite new airspace disease. Right basilar subsegmental atelectasis. No large pleural effusion. No pneumothorax. Bones are unchanged. IMPRESSION: Unchanged low lung volumes and bibasilar opacities favored to be atelectasis. Endotracheal tube tip overlies the midthoracic trachea approximately 2.7 cm above the carina. Electronically Signed   By: Maurine Simmering M.D.   On: 07/23/2021 09:10   DG CHEST PORT 1 VIEW  Result Date: 07/22/2021 CLINICAL DATA:  Intubated and on ventilator. EXAM: PORTABLE CHEST 1 VIEW COMPARISON:  Radiograph yesterday. FINDINGS: Endotracheal tube tip at the level of the clavicular heads. Tip and  side port of the enteric tube below the diaphragm in the stomach. There is a new right internal jugular large-bore catheter with tip overlying the lower SVC. No pneumothorax. Lower lung volumes from prior exam with bibasilar volume loss. Streaky atelectasis in the right greater than left lung base stable heart size and mediastinal contours. IMPRESSION: 1. Endotracheal tube tip at the clavicular heads. Enteric tube tip and side-port below the diaphragm in the stomach. Right internal jugular catheter with tip overlying the lower SVC. No pneumothorax. 2. Lower lung volumes with bibasilar volume loss and streaky atelectasis. Electronically Signed   By: Keith Rake M.D.   On: 07/22/2021 19:10   DG Abdomen 1 View  Result Date: 07/22/2021 CLINICAL DATA:  Check gastric catheter placement EXAM: ABDOMEN - 1 VIEW COMPARISON:  CT from the previous day. FINDINGS: Gastric catheter is been advanced into the stomach. No free air is seen. The known small bowel obstruction is not well appreciated on this exam. IMPRESSION: Gastric catheter within the stomach. Electronically Signed   By: Inez Catalina M.D.   On: 07/22/2021 02:31   CT ABDOMEN PELVIS WO CONTRAST  Result Date: 07/22/2021 CLINICAL DATA:  Abdominal pain, acute.  Nausea and vomiting. EXAM: CT ABDOMEN AND PELVIS WITHOUT CONTRAST TECHNIQUE: Multidetector CT imaging of the abdomen and pelvis was performed following the standard protocol without IV contrast. RADIATION DOSE REDUCTION: This exam was performed according to the departmental dose-optimization program which includes automated exposure control, adjustment of the mA and/or kV according to patient size and/or use of iterative reconstruction technique. COMPARISON:  CT 09/13/2019 FINDINGS: Lower chest: Ill-defined opacities in the right lower and middle lobe favoring atelectasis. No pleural fluid. Hepatobiliary: No focal hepatic abnormality on this unenhanced exam. Layering gallstones and probable sludge. No  pericholecystic inflammation or biliary dilatation. Pancreas: Pancreatic atrophy.  No ductal dilatation or inflammation. Spleen: Punctate granuloma.  Normal in size. Adrenals/Urinary Tract: No adrenal nodule. Mild bilateral renal parenchymal atrophy. No hydronephrosis. Small cyst in the lower left kidney, needing no further follow-up. No renal calculi. Unremarkable urinary bladder. Stomach/Bowel: Large complex lower ventral abdominal wall hernia, with hernia contents deviating to the right. Majority of the small bowel is located within this ventral abdominal wall hernia. Hernia contains multiple loops of small bowel. Small bowel obstruction with dilated fluid-filled small bowel. The stomach is prominently distended with fluid. There is also fluid distending the included distal esophagus. Small bowel transition point right aspect of the ventral hernia, for example series 4, image 44 and series 6, image 48. Multiple enteric sutures are noted within small bowel in the hernia sac. Multifocal colonic diverticulosis without focal diverticulitis. There is mild mesenteric edema in the small bowel without small bowel pneumatosis. Vascular/Lymphatic: Aortic atherosclerosis. No aortic aneurysm. No portal venous or mesenteric gas. No bulky abdominopelvic adenopathy. Reproductive: Electronic records lists history of hysterectomy, however in atrophic uterus is visualized in  the pelvis. No adnexal mass. Other: No ascites or free air. Mesenteric edema and stranding involving small bowel both within the ventral abdominal wall hernia at and just proximal to. Musculoskeletal: Scoliosis and degenerative change in the spine. There are no acute or suspicious osseous abnormalities. IMPRESSION: 1. Small bowel obstruction. There is a large lower abdominal ventral abdominal wall hernia containing majority of the small bowel, the transition point is located within this ventral abdominal wall hernia. 2. Colonic diverticulosis without focal  diverticulitis. 3. Cholelithiasis without acute inflammation. Aortic Atherosclerosis (ICD10-I70.0). Electronically Signed   By: Keith Rake M.D.   On: 07/22/2021 00:20   DG Chest Portable 1 View  Result Date: 07/21/2021 CLINICAL DATA:  Altered mental status and hypotension EXAM: PORTABLE CHEST 1 VIEW COMPARISON:  01/12/2016 FINDINGS: The heart size and mediastinal contours are within normal limits. Both lungs are clear. The visualized skeletal structures are unremarkable. IMPRESSION: No active disease. Electronically Signed   By: Inez Catalina M.D.   On: 07/21/2021 20:27    Anti-infectives: Anti-infectives (From admission, onward)    Start     Dose/Rate Route Frequency Ordered Stop   07/22/21 0900  piperacillin-tazobactam (ZOSYN) IVPB 2.25 g        2.25 g 100 mL/hr over 30 Minutes Intravenous Every 8 hours 07/22/21 0833     07/22/21 0830  piperacillin-tazobactam (ZOSYN) IVPB 3.375 g  Status:  Discontinued        3.375 g 100 mL/hr over 30 Minutes Intravenous Every 8 hours 07/22/21 0827 07/22/21 0831   07/22/21 0000  piperacillin-tazobactam (ZOSYN) IVPB 3.375 g        3.375 g 100 mL/hr over 30 Minutes Intravenous  Once 07/21/21 2352 07/22/21 0142        Assessment/Plan Incarcerated and strangulated ventral hernia -POD#1 s/p  exploratory laparotomy, small bowel resection, adhesiolysis x90 minutes, primary repair of incarcerated ventral hernia, JP drain placement 7/7 Dr. Bobbye Morton - monitor JP, serosanguinous - Continue NPO/NGT to LIWS - needs 4 days zosyn postop - Increasing pressor requirements noted. Abdomen is soft without peritonitis. Reviewed with MD, will trend lactic acid and monitor abdominal exam closely  ID - zosyn 7/7>> FEN - IVF, NPO/NGT to LIWS VTE - sq heparin Foley - continue  ARF VDRF HTN    LOS: 1 day    Wellington Hampshire, [facility] Surgery 07/23/2021, 10:07 AM Please see Amion for pager number during day hours 7:00am-4:30pm

## 2021-07-23 NOTE — Progress Notes (Signed)
eLink Physician-Brief Progress Note Patient Name: [name] DOB: 1955-05-29 MRN: [id number]   Date of Service  07/23/2021  HPI/Events of Note  Nursing concerned about what appeared to be a rash on abdomen earlier. Now looks like bruising and is spreading to breasts. Platelets = 159.  eICU Interventions  Plan: Hold Heparin [place] until patient re-evaluated by PCCM rounding team. PT/INR, PTT now.      Intervention Category Major Interventions: Other:  [name] 07/23/2021, 5:12 AM

## 2021-07-24 ENCOUNTER — Inpatient Hospital Stay (HOSPITAL_COMMUNITY): Payer: BC Managed Care – PPO

## 2021-07-24 DIAGNOSIS — N1832 Chronic kidney disease, stage 3b: Secondary | ICD-10-CM | POA: Diagnosis not present

## 2021-07-24 DIAGNOSIS — K436 Other and unspecified ventral hernia with obstruction, without gangrene: Secondary | ICD-10-CM | POA: Diagnosis not present

## 2021-07-24 DIAGNOSIS — N179 Acute kidney failure, unspecified: Secondary | ICD-10-CM | POA: Diagnosis not present

## 2021-07-24 LAB — URINALYSIS, ROUTINE W REFLEX MICROSCOPIC
Bilirubin Urine: NEGATIVE
Glucose, UA: NEGATIVE mg/dL
Ketones, ur: NEGATIVE mg/dL
Leukocytes,Ua: NEGATIVE
Nitrite: NEGATIVE
Protein, ur: 100 mg/dL — AB
Specific Gravity, Urine: 1.02 (ref 1.005–1.030)
pH: 5 (ref 5.0–8.0)

## 2021-07-24 LAB — CBC
HCT: 26.5 % — ABNORMAL LOW (ref 36.0–46.0)
Hemoglobin: 8.8 g/dL — ABNORMAL LOW (ref 12.0–15.0)
MCH: 30.9 pg (ref 26.0–34.0)
MCHC: 33.2 g/dL (ref 30.0–36.0)
MCV: 93 fL (ref 80.0–100.0)
Platelets: 142 10*3/uL — ABNORMAL LOW (ref 150–400)
RBC: 2.85 MIL/uL — ABNORMAL LOW (ref 3.87–5.11)
RDW: 18.6 % — ABNORMAL HIGH (ref 11.5–15.5)
WBC: 10.9 10*3/uL — ABNORMAL HIGH (ref 4.0–10.5)
nRBC: 0.2 % (ref 0.0–0.2)

## 2021-07-24 LAB — BASIC METABOLIC PANEL
Anion gap: 21 — ABNORMAL HIGH (ref 5–15)
BUN: 96 mg/dL — ABNORMAL HIGH (ref 8–23)
CO2: 15 mmol/L — ABNORMAL LOW (ref 22–32)
Calcium: 6.2 mg/dL — CL (ref 8.9–10.3)
Chloride: 105 mmol/L (ref 98–111)
Creatinine, Ser: 7.68 mg/dL — ABNORMAL HIGH (ref 0.44–1.00)
GFR, Estimated: 5 mL/min — ABNORMAL LOW (ref >60)
Glucose, Bld: 137 mg/dL — ABNORMAL HIGH (ref 70–99)
Potassium: 4.1 mmol/L (ref 3.5–5.1)
Sodium: 141 mmol/L (ref 135–145)

## 2021-07-24 LAB — POCT I-STAT 7, (LYTES, BLD GAS, ICA,H+H)
Acid-base deficit: 10 mmol/L — ABNORMAL HIGH (ref 0.0–2.0)
Bicarbonate: 16.1 mmol/L — ABNORMAL LOW (ref 20.0–28.0)
Calcium, Ion: 0.8 mmol/L — CL (ref 1.15–1.40)
HCT: 26 % — ABNORMAL LOW (ref 36.0–46.0)
Hemoglobin: 8.8 g/dL — ABNORMAL LOW (ref 12.0–15.0)
O2 Saturation: 99 %
Patient temperature: 98.6
Potassium: 4.1 mmol/L (ref 3.5–5.1)
Sodium: 141 mmol/L (ref 135–145)
TCO2: 17 mmol/L — ABNORMAL LOW (ref 22–32)
pCO2 arterial: 34.3 mmHg (ref 32–48)
pH, Arterial: 7.279 — ABNORMAL LOW (ref 7.35–7.45)
pO2, Arterial: 178 mmHg — ABNORMAL HIGH (ref 83–108)

## 2021-07-24 LAB — SODIUM, URINE, RANDOM: Sodium, Ur: 45 mmol/L

## 2021-07-24 LAB — GLUCOSE, CAPILLARY
Glucose-Capillary: 74 mg/dL (ref 70–99)
Glucose-Capillary: 84 mg/dL (ref 70–99)
Glucose-Capillary: 84 mg/dL (ref 70–99)
Glucose-Capillary: 91 mg/dL (ref 70–99)
Glucose-Capillary: 98 mg/dL (ref 70–99)

## 2021-07-24 LAB — CREATININE, URINE, RANDOM: Creatinine, Urine: 125.41 mg/dL

## 2021-07-24 MED ORDER — HYDROCORTISONE SOD SUC (PF) 100 MG IJ SOLR
50.0000 mg | Freq: Two times a day (BID) | INTRAMUSCULAR | Status: DC
  Administered 2021-07-24 – 2021-07-25 (×3): 50 mg via INTRAVENOUS
  Filled 2021-07-24 (×3): qty 1

## 2021-07-24 NOTE — Consult Note (Signed)
Reason for Consult: Acute kidney injury on chronic kidney disease stage IIIa Referring Physician: Chesley Mires, MD (CCM)  HPI:  66 year old woman with past medical history significant for hypertension, dyslipidemia, osteoarthritis, history of ventral wall hernia with bowel obstruction requiring surgery in 2007 and 2012, obesity and chronic kidney disease stage IIIa (baseline creatinine from 2 years ago was 1.2-1.3).  She was admitted 2 days ago with complaints of generalized sharp abdominal pain with nausea and vomiting that was found to be from an incarcerated and strangulated ventral hernia.  She underwent exploratory laparotomy with small bowel resection and adhesiolysis as well as primary repair of incarcerated ventral hernia with JP drain placement.  On presentation, she was found to have acute kidney injury with a creatinine of 5.3 that has steadily continued to rise and is now 7.7 with decreasing urine output.  She was previously in septic shock secondary to peritonitis and has been weaned off of pressors as of this morning.  She has not had any iodinated intravenous contrast exposure and is on antimicrobial coverage with Zosyn.  Past Medical History:  Diagnosis Date   Abdominal distention    Abdominal pain    Arthritis    Blood transfusion without reported diagnosis    Hernia    Hyperlipidemia    Hypertension    Leg swelling    Strangulated recurrent ventral incisional hernia, s/p small intestine resection 09/22/2010    Past Surgical History:  Procedure Laterality Date   ABDOMINAL HYSTERECTOMY     bowel obstruction  09/12/2010   HERNIA REPAIR  2007   incarcerated, open with biologic mesh   HERNIA REPAIR  Aug2012   strangulated, redo open with biologic mesh   INGUINAL HERNIA REPAIR N/A 07/22/2021   Procedure: HERNIA REPAIR VENTRAL INCARCERATED;  Surgeon: [name], MD;  Location: [facility];  Service: General;  Laterality: N/A;   LAPAROTOMY N/A 07/22/2021   Procedure: EXPLORATORY  LAPAROTOMY;  Surgeon: [name], MD;  Location: [facility];  Service: General;  Laterality: N/A;   SMALL INTESTINE SURGERY  09/12/10   SBR in incarcerated [facility]    Family History  Problem Relation Age of Onset   Heart disease Mother        heart attack    Heart disease Father        heart attack    Hypertension Sister    Breast cancer Sister     Social History:  reports that she has never smoked. She has never used smokeless tobacco. She reports that she does not drink alcohol and does not use drugs.  Allergies:  Allergies  Allergen Reactions   Lisinopril Cough    Medications: I have reviewed the patient's current medications. Scheduled:  Chlorhexidine Gluconate Cloth  6 each Topical Daily   heparin injection (subcutaneous)  5,000 Units Subcutaneous Q8H   hydrocortisone sod succinate (SOLU-CORTEF) inj  50 mg Intravenous Q12H   mouth rinse  15 mL Mouth Rinse Q2H   pantoprazole (PROTONIX) IV  40 mg Intravenous Q12H   sodium chloride flush  10-40 mL Intracatheter Q12H   sodium chloride flush  3 mL Intravenous Q12H       Latest Ref Rng & Units 07/24/2021    5:24 AM 07/24/2021    5:07 AM 07/23/2021    3:19 AM  BMP  Glucose 70 - 99 mg/dL 137   102   BUN 8 - 23 mg/dL 96   86   Creatinine 0.44 - 1.00 mg/dL 7.68   6.42  Sodium 135 - 145 mmol/L 141  141  144   Potassium 3.5 - 5.1 mmol/L 4.1  4.1  4.0   Chloride 98 - 111 mmol/L 105   105   CO2 22 - 32 mmol/L 15   18   Calcium 8.9 - 10.3 mg/dL 6.2   6.8       Latest Ref Rng & Units 07/24/2021    5:24 AM 07/24/2021    5:07 AM 07/23/2021    9:19 AM  CBC  WBC 4.0 - 10.5 K/uL 10.9   11.5   Hemoglobin 12.0 - 15.0 g/dL 8.8  8.8  10.0   Hematocrit 36.0 - 46.0 % 26.5  26.0  29.4   Platelets 150 - 400 K/uL 142   202    Urinalysis    Component Value Date/Time   COLORURINE AMBER (A) 07/24/2021 0905   APPEARANCEUR CLOUDY (A) 07/24/2021 0905   LABSPEC 1.020 07/24/2021 0905   PHURINE 5.0 07/24/2021 0905   GLUCOSEU NEGATIVE 07/24/2021  0905   HGBUR MODERATE (A) 07/24/2021 0905   BILIRUBINUR NEGATIVE 07/24/2021 0905   BILIRUBINUR negative 07/11/2017 1553   KETONESUR NEGATIVE 07/24/2021 0905   PROTEINUR 100 (A) 07/24/2021 0905   UROBILINOGEN 0.2 07/11/2017 1553   UROBILINOGEN 1.0 09/27/2010 2313   NITRITE NEGATIVE 07/24/2021 0905   LEUKOCYTESUR NEGATIVE 07/24/2021 0905      DG Chest Port 1 View  Result Date: 07/24/2021 CLINICAL DATA:  Respirator dependent Evaluate atelectasis. pleural effusion. pneumothorax EXAM: PORTABLE CHEST 1 VIEW COMPARISON:  07/23/2021 and older studies. FINDINGS: Mild increase in right lung base opacity. Stable mild medial left lung base opacity. Remainder of the lungs is clear. Possible small right effusion.  No pneumothorax. Endotracheal tube, nasal/orogastric tube and right internal jugular central venous catheter are stable in well positioned. IMPRESSION: 1. Mild increase in right lung base opacity, likely due to atelectasis. Possible small right effusion. 2. No other change. 3. Stable well-positioned support apparatus. Electronically Signed   By: Lajean Manes M.D.   On: 07/24/2021 08:38   DG Abd 1 View  Result Date: 07/23/2021 CLINICAL DATA:  NG tube placement EXAM: ABDOMEN - 1 VIEW COMPARISON:  07/22/2021 FINDINGS: Esophageal tube tip overlies the proximal stomach, side-port in the region of GE junction. Endotracheal tube tip about 3 cm superior to the carina. Gas-filled slightly distended small bowel in the left upper quadrant. IMPRESSION: Esophageal tube tip overlies the proximal stomach, side-port in the region of GE junction, further advancement could be considered for more optimal positioning. Electronically Signed   By: Donavan Foil M.D.   On: 07/23/2021 20:20   ECHOCARDIOGRAM COMPLETE  Result Date: 07/23/2021    ECHOCARDIOGRAM REPORT   Patient Name:   [name] Date of Exam: 07/23/2021 Medical Rec #:  [id number]       Height:       61.0 in Accession #:    [id number]      Weight:        215.0 lb Date of Birth:  [birth year]       BSA:          1.948 m Patient Age:    48 years        BP:           110/61 mmHg Patient Gender: F               HR:           82 bpm. Exam Location:  Inpatient Procedure: 2D Echo, Cardiac  Doppler, Color Doppler and Intracardiac            Opacification Agent Indications:    Abnormal ECG R94.31                 Murmur R01.1  History:        Patient has no prior history of Echocardiogram examinations.                 Risk Factors:Hypertension and Dyslipidemia.  Sonographer:    Darlina Sicilian RDCS Referring Phys: Idamae Schuller IMPRESSIONS  1. Mid-systolic obliteration of the LV cavity is noted. Left ventricular ejection fraction, by estimation, is >75%. The left ventricle has hyperdynamic function. The left ventricle has no regional wall motion abnormalities. There is mild left ventricular hypertrophy. Left ventricular diastolic parameters are consistent with Grade I diastolic dysfunction (impaired relaxation).  2. Right ventricular systolic function is hyperdynamic. The right ventricular size is not well visualized.  3. The mitral valve is grossly normal. No evidence of mitral valve regurgitation.  4. The aortic valve was not well visualized. Aortic valve regurgitation is mild. Aortic valve sclerosis/calcification is present, without any evidence of aortic stenosis.  5. The inferior vena cava is normal in size with greater than 50% respiratory variability, suggesting right atrial pressure of 3 mmHg. Comparison(s): No prior Echocardiogram. FINDINGS  Left Ventricle: Mid-systolic obliteration of the LV cavity is noted. Left ventricular ejection fraction, by estimation, is >75%. The left ventricle has hyperdynamic function. The left ventricle has no regional wall motion abnormalities. Definity contrast agent was given IV to delineate the left ventricular endocardial borders. The left ventricular internal cavity size was normal in size. There is mild left ventricular hypertrophy. Left  ventricular diastolic parameters are consistent with Grade I  diastolic dysfunction (impaired relaxation). Indeterminate filling pressures. Right Ventricle: The right ventricular size is not well visualized. Right vetricular wall thickness was not well visualized. Right ventricular systolic function is hyperdynamic. Left Atrium: Left atrial size was normal in size. Right Atrium: Right atrial size was normal in size. Pericardium: There is no evidence of pericardial effusion. Mitral Valve: The mitral valve is grossly normal. No evidence of mitral valve regurgitation. Tricuspid Valve: The tricuspid valve is not well visualized. Tricuspid valve regurgitation is not demonstrated. Aortic Valve: The aortic valve was not well visualized. Aortic valve regurgitation is mild. Aortic regurgitation PHT measures 538 msec. Aortic valve sclerosis/calcification is present, without any evidence of aortic stenosis. Pulmonic Valve: The pulmonic valve was not well visualized. Pulmonic valve regurgitation is not visualized. Aorta: Aortic root could not be assessed. Venous: The inferior vena cava is normal in size with greater than 50% respiratory variability, suggesting right atrial pressure of 3 mmHg. IAS/Shunts: The interatrial septum was not well visualized.   Diastology LV e' medial:    5.00 cm/s LV E/e' medial:  12.7 LV e' lateral:   4.79 cm/s LV E/e' lateral: 13.2  RIGHT VENTRICLE RV S prime:     24.40 cm/s TAPSE (M-mode): 1.5 cm LEFT ATRIUM             Index        RIGHT ATRIUM           Index LA Vol (A2C):   39.6 ml 20.33 ml/m  RA Area:     11.50 cm LA Vol (A4C):   47.3 ml 24.28 ml/m  RA Volume:   24.10 ml  12.37 ml/m LA Biplane Vol: 43.8 ml 22.49 ml/m  AORTIC VALVE AI PHT:  538 msec MITRAL VALVE MV Area (PHT): 2.42 cm MV Decel Time: 314 msec MV E velocity: 63.40 cm/s MV A velocity: 94.30 cm/s MV E/A ratio:  0.67 Lyman Bishop MD Electronically signed by Lyman Bishop MD Signature Date/Time: 07/23/2021/11:44:42 AM     Final    DG CHEST PORT 1 VIEW  Result Date: 07/23/2021 CLINICAL DATA:  Intubated EXAM: PORTABLE CHEST 1 VIEW COMPARISON:  Radiograph 07/22/2021 FINDINGS: Endotracheal tube overlies the midthoracic trachea approximately 2.7 cm above the carina. Right neck catheter tip overlies the distal superior vena cava. Orogastric tube tip and side port overlie the stomach. Unchanged cardiomediastinal silhouette. Unchanged low lung volumes and bibasilar opacities favored to be atelectasis. Haziness of the right hemithorax likely due to radiographic technique. No definite new airspace disease. Right basilar subsegmental atelectasis. No large pleural effusion. No pneumothorax. Bones are unchanged. IMPRESSION: Unchanged low lung volumes and bibasilar opacities favored to be atelectasis. Endotracheal tube tip overlies the midthoracic trachea approximately 2.7 cm above the carina. Electronically Signed   By: Maurine Simmering M.D.   On: 07/23/2021 09:10   DG CHEST PORT 1 VIEW  Result Date: 07/22/2021 CLINICAL DATA:  Intubated and on ventilator. EXAM: PORTABLE CHEST 1 VIEW COMPARISON:  Radiograph yesterday. FINDINGS: Endotracheal tube tip at the level of the clavicular heads. Tip and side port of the enteric tube below the diaphragm in the stomach. There is a new right internal jugular large-bore catheter with tip overlying the lower SVC. No pneumothorax. Lower lung volumes from prior exam with bibasilar volume loss. Streaky atelectasis in the right greater than left lung base stable heart size and mediastinal contours. IMPRESSION: 1. Endotracheal tube tip at the clavicular heads. Enteric tube tip and side-port below the diaphragm in the stomach. Right internal jugular catheter with tip overlying the lower SVC. No pneumothorax. 2. Lower lung volumes with bibasilar volume loss and streaky atelectasis. Electronically Signed   By: Keith Rake M.D.   On: 07/22/2021 19:10    Review of Systems  Unable to perform ROS: Intubated    Blood pressure (!) 117/54, pulse 72, temperature 97.6 F (36.4 C), temperature source Oral, resp. rate 13, height 5\' 1"  (1.549 m), weight 97.5 kg, SpO2 100 %. Physical Exam Vitals reviewed.  Constitutional:      Appearance: She is well-developed. She is obese.     Comments: Intubated, awakens/responds to conversation  HENT:     Head: Normocephalic and atraumatic.  Cardiovascular:     Rate and Rhythm: Normal rate. Rhythm irregular.     Heart sounds: Normal heart sounds. No murmur heard.    Comments: Irregularly irregular Pulmonary:     Effort: Pulmonary effort is normal.     Breath sounds: Normal breath sounds. No wheezing or rales.  Abdominal:     General: A surgical scar is present. Bowel sounds are absent.     Palpations: Abdomen is soft.     Tenderness: There is generalized abdominal tenderness.     Comments: JP drain in place with honeycomb dressing right periumbilical.  Confluent ecchymotic areas over thighs and lower breasts  Skin:    General: Skin is warm.     Comments: Confluent ecchymotic areas over thighs/lower breasts     Assessment/Plan: 1.  Acute kidney injury chronic kidney disease stage IIIa: Appears consistent with ATN of septic shock.  With decreasing urine output-oliguric overnight but without acute indications for dialysis at this time.  With poor urine output and worsening azotemia/metabolic acidosis, I anticipate she will require renal  replacement therapy within the next 24 hours.  She does not have any significant volume overload. Avoid nephrotoxic medications including NSAIDs and iodinated intravenous contrast exposure unless the latter is absolutely indicated.  Preferred narcotic agents for pain control are hydromorphone, fentanyl, and methadone. Morphine should not be used. Avoid Baclofen and avoid oral sodium phosphate and magnesium citrate based laxatives / bowel preps. Continue strict Input and Output monitoring. Will monitor the patient closely with you  and intervene or adjust therapy as indicated by changes in clinical status/labs.  Her shock has resolved but I suspect that she would tolerate CRRT better than intermittent hemodialysis at this point. 2.  Anion gap metabolic acidosis: Secondary to acute kidney injury.  We will continue to monitor trend closely and decide on need for renal replacement. 3.  Incarcerated ventral hernia: Status post exploratory laparotomy with small bowel resection, adhesiolysis and hernia repair with JP drain placement.  Ongoing follow-up by surgery. 4.  Peritonitis: With resolution of septic shock, weaned off of pressors and remains on corticosteroids.  On intravenous Zosyn. 5.  Anemia: Possibly chronic from malabsorption given prior history of small bowel resection.  Exacerbated by acute/critical illness and perioperative blood losses.   Rasean Joos K. 07/24/2021, 12:13 PM

## 2021-07-24 NOTE — Progress Notes (Signed)
[name], MRN:  [id number], DOB:  [birth year], LOS: 2 ADMISSION DATE:  07/21/2021, CONSULTATION DATE:  07/22/21 REFERRING MD:  EDP, CHIEF COMPLAINT:  Abdominal Pain   History of Present Illness:  66 yo female presented to the ER with abdominal pain, nausea and vomiting.  Found to have SBO, and large lower abdominal ventral hernia.  Had emergent laparotomy.  PCCM consulted to assist with ICU management.  Pertinent  Medical History  Arthritis, HLD, HTN, Ventral hernia  Significant Hospital Events: Including procedures, antibiotic start and stop dates in addition to other pertinent events   7/07-presented to the ED with abdominal pain and SBO, to OR for ex-lap, txfr to ICU  7/08 elevated troponin >> cardiology consulted 7/09 off pressors; renal consulted  Interim History / Subjective:  Off sedation and pressors.  Minimal urine outpt.  Objective:   Blood pressure 132/62, pulse 73, temperature 97.6 F (36.4 C), temperature source Oral, resp. rate 18, height 5\' 1"  (1.549 m), weight 97.5 kg, SpO2 99 %.  On 50 %FiO2.     Vent Mode: PRVC FiO2 (%):  [30 %-50 %] 30 % Set Rate:  [18 bmp] 18 bmp Vt Set:  [380 mL] 380 mL PEEP:  [5 cmH20] 5 cmH20 Plateau Pressure:  [12 cmH20-15 cmH20] 15 cmH20   Intake/Output Summary (Last 24 hours) at 07/24/2021 4401 Last data filed at 07/24/2021 0500 Gross per 24 hour  Intake 4686.54 ml  Output 760 ml  Net 3926.54 ml   Filed Weights   07/21/21 1923  Weight: 97.5 kg   Examination:  General - sleepy Eyes - pupils reactive ENT - ETT in place Cardiac - regular rate/rhythm, 2/6 SM Chest - equal breath sounds b/l, no wheezing or rales Abdomen - wound site clean Extremities - no cyanosis, clubbing, or edema Skin - decreased ecchymosis in upper abdomen and upper legs Neuro - wakes up easily, follows commands  Resolved Hospital Problem list     Assessment & Plan:   Incarcerated and strangulated ventral hernia s/p SB resection, lysis of  adhesions, repair of hernia, and JP placement. - post op care, nutrition per surgery  Septic shock. - off pressors 7/09 - wean off solu cortef as tolerated  Peritonitis. - Abx day 4, currently on zosyn  Post-operative ventilatory support. - might be ready for extubation trial soon  Elevated troponin. - likely from demand ischemia - EF normal on Echo from 7/08  AKI from ATN 2nd to shock. Gap/Non gap metabolic acidosis. - baseline creatinine 0.88 from 03/25/21 - minimal urine outpt and renal fx getting worse - concerned she might need renal replacement - continue IV fluids - will consult nephrology  Anemia of critical illness and chronic disease. - f/u CBC - transfuse for Hb < 7  Best Practice (right click and "Reselect all SmartList Selections" daily)  Diet/type: NPO DVT prophylaxis: prophylactic heparin  GI prophylaxis: PPI Lines: yes and it is still needed Foley:  Yes, and it is still needed Continuous: 150 cc/hr NaCl, 26 mcg of Levophed, vasopressin.  Code Status:  full code Last date of multidisciplinary goals of care discussion [updated family at bedside on 7/09]  Labs:      Latest Ref Rng & Units 07/24/2021    5:24 AM 07/24/2021    5:07 AM 07/23/2021    3:19 AM  CMP  Glucose 70 - 99 mg/dL 137   102   BUN 8 - 23 mg/dL 96   86   Creatinine 0.44 - 1.00  mg/dL 7.68   6.42   Sodium 135 - 145 mmol/L 141  141  144   Potassium 3.5 - 5.1 mmol/L 4.1  4.1  4.0   Chloride 98 - 111 mmol/L 105   105   CO2 22 - 32 mmol/L 15   18   Calcium 8.9 - 10.3 mg/dL 6.2   6.8   Total Protein 6.5 - 8.1 g/dL   5.0   Total Bilirubin 0.3 - 1.2 mg/dL   1.0   Alkaline Phos 38 - 126 U/L   36   AST 15 - 41 U/L   36   ALT 0 - 44 U/L   34        Latest Ref Rng & Units 07/24/2021    5:24 AM 07/24/2021    5:07 AM 07/23/2021    9:19 AM  CBC  WBC 4.0 - 10.5 K/uL 10.9   11.5   Hemoglobin 12.0 - 15.0 g/dL 8.8  8.8  10.0   Hematocrit 36.0 - 46.0 % 26.5  26.0  29.4   Platelets 150 - 400 K/uL 142    202     ABG    Component Value Date/Time   PHART 7.279 (L) 07/24/2021 0507   PCO2ART 34.3 07/24/2021 0507   PO2ART 178 (H) 07/24/2021 0507   HCO3 16.1 (L) 07/24/2021 0507   TCO2 17 (L) 07/24/2021 0507   ACIDBASEDEF 10.0 (H) 07/24/2021 0507   O2SAT 99 07/24/2021 0507    CBG (last 3)  Recent Labs    07/23/21 1957 07/23/21 2338 07/24/21 0753  GLUCAP 112* 141* 98    Critical care time: 33 minutes  Chesley Mires, MD [place] Pager - [phone] 07/24/2021, 8:24 AM

## 2021-07-24 NOTE — Progress Notes (Signed)
Patient ID: [name], female   DOB: 1955/09/14, 66 y.o.   MRN: [id number] [facility] Surgery Progress Note  2 Days Post-Op  Subjective: CC-  Off pressors this morning. Worsening renal failure, Cr 7.68, UOP 55cc last 24 hours NG out put 650cc/24hr, bilious  Objective: Vital signs in last 24 hours: Temp:  [97.6 F (36.4 C)-100.1 F (37.8 C)] 97.6 F (36.4 C) (07/09 0700) Pulse Rate:  [62-122] 72 (07/09 0900) Resp:  [0-28] 13 (07/09 0900) BP: (115-164)/(49-78) 117/54 (07/09 0900) SpO2:  [93 %-100 %] 100 % (07/09 0900) Arterial Line BP: (93-176)/(42-64) 147/47 (07/09 0900) FiO2 (%):  [30 %-50 %] 30 % (07/09 0844) Last BM Date :  (pta)  Intake/Output from previous day: 07/08 0701 - 07/09 0700 In: 4686.5 [I.V.:4586.5; IV Piggyback:100] Out: 770 [Urine:55; Emesis/NG output:650; Drains:65] Intake/Output this shift: Total I/O In: 455.1 [I.V.:455.1] Out: 10 [Urine:10]  PE: Gen:  somewhat awake on the vent, NAD HEENT: NG with bilious output Card:  irregular, HR 80s Pulm:  mechanically ventilated Abd: soft, mild diffuse tenderness without rebound or guarding, no peritonitis, JP serosanguinous. Honeycomb dressing with small amount of blood at distal aspect. Rash vs ecchymosis noted on proximal thighs and intramammary area stable GU: foley with small amount of clear yellow urine  Lab Results:  Recent Labs    07/23/21 0919 07/24/21 0507 07/24/21 0524  WBC 11.5*  --  10.9*  HGB 10.0* 8.8* 8.8*  HCT 29.4* 26.0* 26.5*  PLT 202  --  142*   BMET Recent Labs    07/23/21 0319 07/24/21 0507 07/24/21 0524  NA 144 141 141  K 4.0 4.1 4.1  CL 105  --  105  CO2 18*  --  15*  GLUCOSE 102*  --  137*  BUN 86*  --  96*  CREATININE 6.42*  --  7.68*  CALCIUM 6.8*  --  6.2*   PT/INR Recent Labs    07/23/21 0527  LABPROT 20.8*  INR 1.8*   CMP     Component Value Date/Time   NA 141 07/24/2021 0524   NA 146 (H) 07/11/2017 1547   K 4.1 07/24/2021 0524   CL 105  07/24/2021 0524   CO2 15 (L) 07/24/2021 0524   GLUCOSE 137 (H) 07/24/2021 0524   BUN 96 (H) 07/24/2021 0524   BUN 14 07/11/2017 1547   CREATININE 7.68 (H) 07/24/2021 0524   CREATININE 1.04 05/06/2014 1538   CALCIUM 6.2 (LL) 07/24/2021 0524   PROT 5.0 (L) 07/23/2021 0319   PROT 6.1 07/11/2017 1547   ALBUMIN 2.4 (L) 07/23/2021 0319   ALBUMIN 3.5 (L) 07/11/2017 1547   AST 36 07/23/2021 0319   ALT 34 07/23/2021 0319   ALKPHOS 36 (L) 07/23/2021 0319   BILITOT 1.0 07/23/2021 0319   BILITOT <0.2 07/11/2017 1547   GFRNONAA 5 (L) 07/24/2021 0524   GFRAA 50 (L) 09/13/2019 0755   Lipase     Component Value Date/Time   LIPASE 24 07/21/2021 2056       Studies/Results: DG Chest Port 1 View  Result Date: 07/24/2021 CLINICAL DATA:  Respirator dependent Evaluate atelectasis. pleural effusion. pneumothorax EXAM: PORTABLE CHEST 1 VIEW COMPARISON:  07/23/2021 and older studies. FINDINGS: Mild increase in right lung base opacity. Stable mild medial left lung base opacity. Remainder of the lungs is clear. Possible small right effusion.  No pneumothorax. Endotracheal tube, nasal/orogastric tube and right internal jugular central venous catheter are stable in well positioned. IMPRESSION: 1. Mild increase in right lung base  opacity, likely due to atelectasis. Possible small right effusion. 2. No other change. 3. Stable well-positioned support apparatus. Electronically Signed   By: Lajean Manes M.D.   On: 07/24/2021 08:38   DG Abd 1 View  Result Date: 07/23/2021 CLINICAL DATA:  NG tube placement EXAM: ABDOMEN - 1 VIEW COMPARISON:  07/22/2021 FINDINGS: Esophageal tube tip overlies the proximal stomach, side-port in the region of GE junction. Endotracheal tube tip about 3 cm superior to the carina. Gas-filled slightly distended small bowel in the left upper quadrant. IMPRESSION: Esophageal tube tip overlies the proximal stomach, side-port in the region of GE junction, further advancement could be considered  for more optimal positioning. Electronically Signed   By: Donavan Foil M.D.   On: 07/23/2021 20:20   ECHOCARDIOGRAM COMPLETE  Result Date: 07/23/2021    ECHOCARDIOGRAM REPORT   Patient Name:   [name] Date of Exam: 07/23/2021 Medical Rec #:  [id number]       Height:       61.0 in Accession #:    [id number]      Weight:       215.0 lb Date of Birth:  1955/08/06       BSA:          1.948 m Patient Age:    82 years        BP:           110/61 mmHg Patient Gender: F               HR:           82 bpm. Exam Location:  Inpatient Procedure: 2D Echo, Cardiac Doppler, Color Doppler and Intracardiac            Opacification Agent Indications:    Abnormal ECG R94.31                 Murmur R01.1  History:        Patient has no prior history of Echocardiogram examinations.                 Risk Factors:Hypertension and Dyslipidemia.  Sonographer:    Darlina Sicilian RDCS Referring Phys: Idamae Schuller IMPRESSIONS  1. Mid-systolic obliteration of the LV cavity is noted. Left ventricular ejection fraction, by estimation, is >75%. The left ventricle has hyperdynamic function. The left ventricle has no regional wall motion abnormalities. There is mild left ventricular hypertrophy. Left ventricular diastolic parameters are consistent with Grade I diastolic dysfunction (impaired relaxation).  2. Right ventricular systolic function is hyperdynamic. The right ventricular size is not well visualized.  3. The mitral valve is grossly normal. No evidence of mitral valve regurgitation.  4. The aortic valve was not well visualized. Aortic valve regurgitation is mild. Aortic valve sclerosis/calcification is present, without any evidence of aortic stenosis.  5. The inferior vena cava is normal in size with greater than 50% respiratory variability, suggesting right atrial pressure of 3 mmHg. Comparison(s): No prior Echocardiogram. FINDINGS  Left Ventricle: Mid-systolic obliteration of the LV cavity is noted. Left ventricular ejection  fraction, by estimation, is >75%. The left ventricle has hyperdynamic function. The left ventricle has no regional wall motion abnormalities. Definity contrast agent was given IV to delineate the left ventricular endocardial borders. The left ventricular internal cavity size was normal in size. There is mild left ventricular hypertrophy. Left ventricular diastolic parameters are consistent with Grade I  diastolic dysfunction (impaired relaxation). Indeterminate filling pressures. Right Ventricle: The right ventricular size is not  well visualized. Right vetricular wall thickness was not well visualized. Right ventricular systolic function is hyperdynamic. Left Atrium: Left atrial size was normal in size. Right Atrium: Right atrial size was normal in size. Pericardium: There is no evidence of pericardial effusion. Mitral Valve: The mitral valve is grossly normal. No evidence of mitral valve regurgitation. Tricuspid Valve: The tricuspid valve is not well visualized. Tricuspid valve regurgitation is not demonstrated. Aortic Valve: The aortic valve was not well visualized. Aortic valve regurgitation is mild. Aortic regurgitation PHT measures 538 msec. Aortic valve sclerosis/calcification is present, without any evidence of aortic stenosis. Pulmonic Valve: The pulmonic valve was not well visualized. Pulmonic valve regurgitation is not visualized. Aorta: Aortic root could not be assessed. Venous: The inferior vena cava is normal in size with greater than 50% respiratory variability, suggesting right atrial pressure of 3 mmHg. IAS/Shunts: The interatrial septum was not well visualized.   Diastology LV e' medial:    5.00 cm/s LV E/e' medial:  12.7 LV e' lateral:   4.79 cm/s LV E/e' lateral: 13.2  RIGHT VENTRICLE RV S prime:     24.40 cm/s TAPSE (M-mode): 1.5 cm LEFT ATRIUM             Index        RIGHT ATRIUM           Index LA Vol (A2C):   39.6 ml 20.33 ml/m  RA Area:     11.50 cm LA Vol (A4C):   47.3 ml 24.28 ml/m  RA  Volume:   24.10 ml  12.37 ml/m LA Biplane Vol: 43.8 ml 22.49 ml/m  AORTIC VALVE AI PHT:      538 msec MITRAL VALVE MV Area (PHT): 2.42 cm MV Decel Time: 314 msec MV E velocity: 63.40 cm/s MV A velocity: 94.30 cm/s MV E/A ratio:  0.67 Lyman Bishop MD Electronically signed by Lyman Bishop MD Signature Date/Time: 07/23/2021/11:44:42 AM    Final    DG CHEST PORT 1 VIEW  Result Date: 07/23/2021 CLINICAL DATA:  Intubated EXAM: PORTABLE CHEST 1 VIEW COMPARISON:  Radiograph 07/22/2021 FINDINGS: Endotracheal tube overlies the midthoracic trachea approximately 2.7 cm above the carina. Right neck catheter tip overlies the distal superior vena cava. Orogastric tube tip and side port overlie the stomach. Unchanged cardiomediastinal silhouette. Unchanged low lung volumes and bibasilar opacities favored to be atelectasis. Haziness of the right hemithorax likely due to radiographic technique. No definite new airspace disease. Right basilar subsegmental atelectasis. No large pleural effusion. No pneumothorax. Bones are unchanged. IMPRESSION: Unchanged low lung volumes and bibasilar opacities favored to be atelectasis. Endotracheal tube tip overlies the midthoracic trachea approximately 2.7 cm above the carina. Electronically Signed   By: Maurine Simmering M.D.   On: 07/23/2021 09:10   DG CHEST PORT 1 VIEW  Result Date: 07/22/2021 CLINICAL DATA:  Intubated and on ventilator. EXAM: PORTABLE CHEST 1 VIEW COMPARISON:  Radiograph yesterday. FINDINGS: Endotracheal tube tip at the level of the clavicular heads. Tip and side port of the enteric tube below the diaphragm in the stomach. There is a new right internal jugular large-bore catheter with tip overlying the lower SVC. No pneumothorax. Lower lung volumes from prior exam with bibasilar volume loss. Streaky atelectasis in the right greater than left lung base stable heart size and mediastinal contours. IMPRESSION: 1. Endotracheal tube tip at the clavicular heads. Enteric tube tip and  side-port below the diaphragm in the stomach. Right internal jugular catheter with tip overlying the lower SVC. No pneumothorax. 2.  Lower lung volumes with bibasilar volume loss and streaky atelectasis. Electronically Signed   By: Keith Rake M.D.   On: 07/22/2021 19:10    Anti-infectives: Anti-infectives (From admission, onward)    Start     Dose/Rate Route Frequency Ordered Stop   07/23/21 1730  piperacillin-tazobactam (ZOSYN) IVPB 2.25 g        2.25 g 100 mL/hr over 30 Minutes Intravenous Every 8 hours 07/23/21 1207 07/28/21 1729   07/22/21 0900  piperacillin-tazobactam (ZOSYN) IVPB 2.25 g  Status:  Discontinued        2.25 g 100 mL/hr over 30 Minutes Intravenous Every 8 hours 07/22/21 0833 07/23/21 1207   07/22/21 0830  piperacillin-tazobactam (ZOSYN) IVPB 3.375 g  Status:  Discontinued        3.375 g 100 mL/hr over 30 Minutes Intravenous Every 8 hours 07/22/21 0827 07/22/21 0831   07/22/21 0000  piperacillin-tazobactam (ZOSYN) IVPB 3.375 g        3.375 g 100 mL/hr over 30 Minutes Intravenous  Once 07/21/21 2352 07/22/21 0142        Assessment/Plan Incarcerated and strangulated ventral hernia -POD#2 s/p  exploratory laparotomy, small bowel resection, adhesiolysis x90 minutes, primary repair of incarcerated ventral hernia, JP drain placement 7/7 Dr. Bobbye Morton - monitor JP, serosanguinous - Continue NPO/NGT to LIWS, AROBF - needs 4 days zosyn postop   ID - zosyn 7/7>> FEN - IVF, NPO/NGT to LIWS VTE - sq heparin Foley - continue   Septic shock - off pressors 7/9 ARF - consulting nephrology VDRF - per CCM may be ready for extubation trial soon. Continue NG if extubated Elevated troponin - cards has seen, suspect secondary to critical illness and not ACS HTN    LOS: 2 days    Wellington Hampshire, [facility] 07/24/2021, 9:38 AM Please see Amion for pager number during day hours 7:00am-4:30pm

## 2021-07-25 ENCOUNTER — Inpatient Hospital Stay (HOSPITAL_COMMUNITY): Payer: BC Managed Care – PPO

## 2021-07-25 DIAGNOSIS — K56609 Unspecified intestinal obstruction, unspecified as to partial versus complete obstruction: Secondary | ICD-10-CM

## 2021-07-25 DIAGNOSIS — A419 Sepsis, unspecified organism: Secondary | ICD-10-CM | POA: Diagnosis not present

## 2021-07-25 DIAGNOSIS — E872 Acidosis, unspecified: Secondary | ICD-10-CM | POA: Diagnosis not present

## 2021-07-25 DIAGNOSIS — N179 Acute kidney failure, unspecified: Secondary | ICD-10-CM | POA: Diagnosis not present

## 2021-07-25 LAB — CBC
HCT: 26.3 % — ABNORMAL LOW (ref 36.0–46.0)
Hemoglobin: 9.2 g/dL — ABNORMAL LOW (ref 12.0–15.0)
MCH: 31.4 pg (ref 26.0–34.0)
MCHC: 35 g/dL (ref 30.0–36.0)
MCV: 89.8 fL (ref 80.0–100.0)
Platelets: 136 10*3/uL — ABNORMAL LOW (ref 150–400)
RBC: 2.93 MIL/uL — ABNORMAL LOW (ref 3.87–5.11)
RDW: 18.6 % — ABNORMAL HIGH (ref 11.5–15.5)
WBC: 13.5 10*3/uL — ABNORMAL HIGH (ref 4.0–10.5)
nRBC: 0.4 % — ABNORMAL HIGH (ref 0.0–0.2)

## 2021-07-25 LAB — BASIC METABOLIC PANEL
Anion gap: 21 — ABNORMAL HIGH (ref 5–15)
BUN: 108 mg/dL — ABNORMAL HIGH (ref 8–23)
CO2: 14 mmol/L — ABNORMAL LOW (ref 22–32)
Calcium: 6.8 mg/dL — ABNORMAL LOW (ref 8.9–10.3)
Chloride: 107 mmol/L (ref 98–111)
Creatinine, Ser: 8.49 mg/dL — ABNORMAL HIGH (ref 0.44–1.00)
GFR, Estimated: 5 mL/min — ABNORMAL LOW (ref >60)
Glucose, Bld: 97 mg/dL (ref 70–99)
Potassium: 4.7 mmol/L (ref 3.5–5.1)
Sodium: 142 mmol/L (ref 135–145)

## 2021-07-25 LAB — GLUCOSE, CAPILLARY
Glucose-Capillary: 83 mg/dL (ref 70–99)
Glucose-Capillary: 84 mg/dL (ref 70–99)
Glucose-Capillary: 89 mg/dL (ref 70–99)
Glucose-Capillary: 90 mg/dL (ref 70–99)
Glucose-Capillary: 94 mg/dL (ref 70–99)
Glucose-Capillary: 99 mg/dL (ref 70–99)

## 2021-07-25 LAB — HEPATITIS C ANTIBODY: HCV Ab: NONREACTIVE

## 2021-07-25 LAB — HEPATITIS B CORE ANTIBODY, TOTAL: Hep B Core Total Ab: NONREACTIVE

## 2021-07-25 LAB — HEPATITIS B SURFACE ANTIBODY,QUALITATIVE: Hep B S Ab: NONREACTIVE

## 2021-07-25 LAB — HEPATITIS B SURFACE ANTIGEN: Hepatitis B Surface Ag: NONREACTIVE

## 2021-07-25 MED ORDER — HYDROCORTISONE SOD SUC (PF) 100 MG IJ SOLR
50.0000 mg | Freq: Every day | INTRAMUSCULAR | Status: DC
Start: 2021-07-26 — End: 2021-07-25

## 2021-07-25 MED ORDER — HEPARIN SODIUM (PORCINE) 1000 UNIT/ML DIALYSIS
1000.0000 [IU] | INTRAMUSCULAR | Status: DC | PRN
  Administered 2021-07-25: 2800 [IU] via INTRAVENOUS_CENTRAL
  Administered 2021-07-27: 1000 [IU] via INTRAVENOUS_CENTRAL
  Administered 2021-08-01: 2600 [IU] via INTRAVENOUS_CENTRAL
  Filled 2021-07-25: qty 6
  Filled 2021-07-25: qty 3
  Filled 2021-07-25: qty 4
  Filled 2021-07-25 (×2): qty 6
  Filled 2021-07-25 (×2): qty 4
  Filled 2021-07-25: qty 6

## 2021-07-25 MED ORDER — DEXTROSE 50 % IV SOLN
12.5000 g | INTRAVENOUS | Status: DC | PRN
  Administered 2021-07-30 – 2021-08-02 (×7): 12.5 g via INTRAVENOUS
  Filled 2021-07-25 (×7): qty 50

## 2021-07-25 MED ORDER — HYDROCORTISONE SOD SUC (PF) 100 MG IJ SOLR
50.0000 mg | Freq: Every day | INTRAMUSCULAR | Status: AC
  Administered 2021-07-26: 50 mg via INTRAVENOUS
  Filled 2021-07-25: qty 1

## 2021-07-25 MED ORDER — HEPARIN SODIUM (PORCINE) 1000 UNIT/ML IJ SOLN
INTRAMUSCULAR | Status: AC
  Administered 2021-07-25: 1000 [IU]
  Filled 2021-07-25: qty 1

## 2021-07-25 MED ORDER — SODIUM BICARBONATE 8.4 % IV SOLN
50.0000 meq | Freq: Once | INTRAVENOUS | Status: AC
  Administered 2021-07-25: 50 meq via INTRAVENOUS
  Filled 2021-07-25: qty 50

## 2021-07-25 MED ORDER — CHLORHEXIDINE GLUCONATE CLOTH 2 % EX PADS
6.0000 | MEDICATED_PAD | Freq: Every day | CUTANEOUS | Status: DC
  Administered 2021-07-26 – 2021-08-08 (×12): 6 via TOPICAL

## 2021-07-25 NOTE — Procedures (Signed)
Central Venous Catheter Insertion Procedure Note  [name]  [id number]  10-Jun-1955  Date:07/25/21  Time:3:00 pm  Provider Performing:Jarius Dieudonne Humphrey Rolls   Procedure: Insertion of Non-tunneled Central Venous Catheter(36556)with US guidance (46270)    Indication(s) Hemodialysis  Consent Risks of the procedure as well as the alternatives and risks of each were explained to the patient and/or caregiver.  Consent for the procedure was obtained and is signed in the bedside chart  Anesthesia Topical only with 1% lidocaine   Timeout Verified patient identification, verified procedure, site/side was marked, verified correct patient position, special equipment/implants available, medications/allergies/relevant history reviewed, required imaging and test results available.  Sterile Technique Maximal sterile technique including full sterile barrier drape, hand hygiene, sterile gown, sterile gloves, mask, hair covering, sterile ultrasound probe cover (if used).  Procedure Description Area of catheter insertion was cleaned with chlorhexidine and draped in sterile fashion.   With real-time ultrasound guidance a HD catheter was placed into the left internal jugular vein.  Nonpulsatile blood flow and easy flushing noted in all ports.  The catheter was sutured in place and sterile dressing applied.  Complications/Tolerance None; patient tolerated the procedure well. Chest X-ray is ordered to verify placement for internal jugular or subclavian cannulation.  Chest x-ray is not ordered for femoral cannulation.  EBL Minimal  Specimen(s) None

## 2021-07-25 NOTE — Progress Notes (Signed)
NAMEPleasant Myers, MRN:  [id number], DOB:  1955-05-30, LOS: 3 ADMISSION DATE:  07/21/2021, CONSULTATION DATE:  07/22/21 REFERRING MD:  EDP, CHIEF COMPLAINT:  Abdominal Pain   History of Present Illness:  [name] is a 66 y.o. 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  She presented to the ED with severe abdominal pain and vomiting on 7/7.  Labs were significant for Creatinine of 5.28, lactic acid 6.5, WBC 11.6 and CT abd/pelvis with SBO, large lower abdominal ventral wall hernia containing most of the small bowel.    Surgery was consulted and she was taken to the OR for emergent exlap and hernia repair.  Post-op pt was transferred to the ICU and PCCM consulted.   Pertinent  Medical History  Arthritis, HLD, HTN, Hx of ventral hernia  Significant Hospital Events: Including procedures, antibiotic start and stop dates in addition to other pertinent events   7/07-presented to the ED with abdominal pain and SBO, to OR for ex-lap, txfr to ICU  7/08 elevated troponin >> cardiology consulted 7/09 off pressors; renal consulted 07/10 On pressure support this am  Interim History / Subjective:  Patient intubated this am. She is calm and follows commands. She is denying any acute concerns this am.   Objective: MAP>65. Afebrile.   Blood pressure (!) 132/59, pulse 68, temperature 97.7 F (36.5 C), temperature source Oral, resp. rate 18, height 5\' 1"  (1.549 m), weight 89.5 kg, SpO2 99 %.  On 50 %FiO2.     Vent Mode: PRVC FiO2 (%):  [30 %-40 %] 40 % Set Rate:  [18 bmp] 18 bmp Vt Set:  [380 mL] 380 mL PEEP:  [5 cmH20] 5 cmH20 Pressure Support:  [10 cmH20] 10 cmH20 Plateau Pressure:  [12 cmH20-17 cmH20] 12 cmH20   Intake/Output Summary (Last 24 hours) at 07/25/2021 0707 Last data filed at 07/25/2021 0500 Gross per 24 hour  Intake 2148.62  ml  Output 770 ml  Net 1378.62 ml    Filed Weights   07/21/21 1923 07/25/21 0530  Weight: 97.5 kg 89.5 kg   Intake: 2.148 L Urine 30 cc Emesis/NG output 700 cc dark output Drains 40  Total output 770 Net 1.4 L  Examination:  General: NAD, laying in bed, intubated Eyes: PERRL ENT: intubated Cardiac: NSR, normal rate Resp: CTAB Abdomen: No TTP, diminished bowel sounds, Drain placed with blood tinged output  Extremities: Symmetric, No LE edema Skin: ecchymosis is resolving Neuro: follows all commands, moving all extremities.   Resolved Hospital Problem list   Septic shock  Assessment & Plan:  #Incarcerated and strangulated ventral hernia s/p SB resection, lysis of adhesions, repair of hernia, and JP placement. #Peritonitis  Abx day 5, currently on zosyn. Last day of abx. Leukocytosis slightly worse but patient received steroids. Surgery following; post op care, nutrition per surgery, plan to start in the next few days. Continue NG tube to suction and continue to monitor drain output -CTM, last day of abx.   #AKI from ATN 2nd to shock. #Gap/Non gap metabolic acidosis. Baseline creatinine 0.88 from 03/25/21. Minimal urine outpt and renal fx getting worse. Cr 8.5 this am. Nephrology consulted and following. Bicarb was low at 14 and 1 amp of bicarb given this morning. HD catheter with plan for HD today.  - continue IV fluids - Nephrology following: Plan for HD today after HD catheter.  -Continue  to monitor electrolytes  #Post-operative ventilatory support. On pressure support this am. Will plan to extubate after HD with volume removal if she continues to do well. -HD as above  #Elevated troponin. - likely from demand ischemia - EF normal on Echo from 7/08  #Anemia of critical illness and chronic disease. Hgb 9.0. Stable. -Will CTM on repeat CBC.  - transfuse for Hb < 7  #Hx of HTN Patient normotensive. Home amlodipine held.  -CTM  #Class II Obesity -Counsel on  lifestyle modifications when patient is stable.   Best Practice (right click and "Reselect all SmartList Selections" daily)  Diet/type: NPO DVT prophylaxis: prophylactic heparin  GI prophylaxis: PPI Lines: yes and it is still needed Foley:  Yes, and it is still needed Continuous: None   Code Status:  full code Last date of multidisciplinary goals of care discussion [updated family at bedside on 7/09]  Labs: CBC shows leukocytosis of 13.5k, anemia at 9.2, plt 136. K 4.7, Cr 8.49 from 7.68, BUN 108, Bicarb 14, AG 21, calcium 6.8 8.1 corrected if alb 2.4.      Latest Ref Rng & Units 07/25/2021    3:43 AM 07/24/2021    5:24 AM 07/24/2021    5:07 AM  CMP  Glucose 70 - 99 mg/dL 97  137    BUN 8 - 23 mg/dL 108  96    Creatinine 0.44 - 1.00 mg/dL 8.49  7.68    Sodium 135 - 145 mmol/L 142  141  141   Potassium 3.5 - 5.1 mmol/L 4.7  4.1  4.1   Chloride 98 - 111 mmol/L 107  105    CO2 22 - 32 mmol/L 14  15    Calcium 8.9 - 10.3 mg/dL 6.8  6.2         Latest Ref Rng & Units 07/25/2021    3:43 AM 07/24/2021    5:24 AM 07/24/2021    5:07 AM  CBC  WBC 4.0 - 10.5 K/uL 13.5  10.9    Hemoglobin 12.0 - 15.0 g/dL 9.2  8.8  8.8   Hematocrit 36.0 - 46.0 % 26.3  26.5  26.0   Platelets 150 - 400 K/uL 136  142      ABG    Component Value Date/Time   PHART 7.279 (L) 07/24/2021 0507   PCO2ART 34.3 07/24/2021 0507   PO2ART 178 (H) 07/24/2021 0507   HCO3 16.1 (L) 07/24/2021 0507   TCO2 17 (L) 07/24/2021 0507   ACIDBASEDEF 10.0 (H) 07/24/2021 0507   O2SAT 99 07/24/2021 0507    CBG (last 3)  Recent Labs    07/24/21 2002 07/24/21 2337 07/25/21 0334  GLUCAP 84 74 94    Home Medications         Prior to Admission medications   Medication Sig Start Date End Date Taking? Authorizing Provider  amLODipine (NORVASC) 10 MG tablet TAKE 1 TABLET(10 MG) BY MOUTH DAILY 07/11/17   Yes Weber, Sarah L, PA-C  amoxicillin-clavulanate (AUGMENTIN) 875-125 MG tablet Take 1 tablet by mouth every 12 (twelve)  hours. Patient not taking: Reported on 07/22/2021 09/13/19     Joy, Raquel Sarna C, PA-C  ibuprofen (ADVIL) 800 MG tablet TAKE 1 TABLET(800 MG) BY MOUTH EVERY 8 HOURS AS NEEDED Patient not taking: Reported on 07/22/2021 07/29/18     Aundra Dubin, PA-C  methocarbamol (ROBAXIN) 500 MG tablet Take 1 tablet (500 mg total) by mouth 2 (two) times daily as needed. Patient not taking: Reported on 07/22/2021 01/06/19  Aundra Dubin, PA-C  ondansetron (ZOFRAN ODT) 4 MG disintegrating tablet Take 1 tablet (4 mg total) by mouth every 8 (eight) hours as needed for nausea or vomiting. Patient not taking: Reported on 07/22/2021 09/13/19     Lorayne Bender, PA-C    Idamae Schuller, MD Tillie Rung. [facility] Internal Medicine Residency, PGY-2

## 2021-07-25 NOTE — Progress Notes (Addendum)
[facility] Progress Note   Subjective:   Remains intubated in ICU.  UOP yesterday only 40mL.  No major overnight issues.  Hemodynamically stable.  Objective Vitals:   07/25/21 0414 07/25/21 0430 07/25/21 0500 07/25/21 0530  BP: (!) 132/59     Pulse: 73 71 68   Resp: 20 17 18    Temp:      TempSrc:      SpO2: 100% 100% 99%   Weight:    89.5 kg  Height:       Physical Exam General: intubated, arousable Heart: RRR Lungs: coarse on vent, no rales Abdomen: soft, midline dressing c/d/I, JP drain in place; NG to suction Extremities: minor edema Dialysis Access:  none, RIJ TLC present  Additional Objective Labs: Basic Metabolic Panel: Recent Labs  Lab 07/23/21 0319 07/24/21 0507 07/24/21 0524 07/25/21 0343  NA 144 141 141 142  K 4.0 4.1 4.1 4.7  CL 105  --  105 107  CO2 18*  --  15* 14*  GLUCOSE 102*  --  137* 97  BUN 86*  --  96* 108*  CREATININE 6.42*  --  7.68* 8.49*  CALCIUM 6.8*  --  6.2* 6.8*   Liver Function Tests: Recent Labs  Lab 07/21/21 2056 07/23/21 0319  AST 27 36  ALT 17 34  ALKPHOS 56 36*  BILITOT 0.6 1.0  PROT 6.0* 5.0*  ALBUMIN 2.7* 2.4*   Recent Labs  Lab 07/21/21 2056  LIPASE 24   CBC: Recent Labs  Lab 07/21/21 2056 07/22/21 1620 07/22/21 1945 07/22/21 2128 07/23/21 0319 07/23/21 0919 07/24/21 0507 07/24/21 0524 07/25/21 0343  WBC 11.6*  --  2.7*  --  7.0 11.5*  --  10.9* 13.5*  NEUTROABS 9.0*  --   --   --   --  9.0*  --   --   --   HGB 13.2   < > 5.4*   < > 10.2* 10.0* 8.8* 8.8* 9.2*  HCT 40.8   < > 16.1*   < > 29.8* 29.4* 26.0* 26.5* 26.3*  MCV 96.7  --  95.8  --  94.0 93.3  --  93.0 89.8  PLT 272  --  82*  --  159 202  --  142* 136*   < > = values in this interval not displayed.   Blood Culture    Component Value Date/Time   SDES BLOOD RIGHT ANTECUBITAL 07/21/2021 1915   SPECREQUEST  07/21/2021 1915    BOTTLES DRAWN AEROBIC ONLY Blood Culture results may not be optimal due to an inadequate volume of  blood received in culture bottles   CULT  07/21/2021 1915    NO GROWTH 4 DAYS Performed at [facility], [place] [address], [place], [place] [address]    REPTSTATUS PENDING 07/21/2021 1915    Cardiac Enzymes: Recent Labs  Lab 07/22/21 0246  CKTOTAL 304*   CBG: Recent Labs  Lab 07/24/21 1119 07/24/21 1601 07/24/21 2002 07/24/21 2337 07/25/21 0334  GLUCAP 84 91 84 74 94   Iron Studies: No results for input(s): "IRON", "TIBC", "TRANSFERRIN", "FERRITIN" in the last 72 hours. @lablastinr3 @ Studies/Results: DG Chest Port 1 View  Result Date: 07/24/2021 CLINICAL DATA:  Respirator dependent Evaluate atelectasis. pleural effusion. pneumothorax EXAM: PORTABLE CHEST 1 VIEW COMPARISON:  07/23/2021 and older studies. FINDINGS: Mild increase in right lung base opacity. Stable mild medial left lung base opacity. Remainder of the lungs is clear. Possible small right effusion.  No pneumothorax. Endotracheal tube,  nasal/orogastric tube and right internal jugular central venous catheter are stable in well positioned. IMPRESSION: 1. Mild increase in right lung base opacity, likely due to atelectasis. Possible small right effusion. 2. No other change. 3. Stable well-positioned support apparatus. Electronically Signed   By: Lajean Manes M.D.   On: 07/24/2021 08:38   DG Abd 1 View  Result Date: 07/23/2021 CLINICAL DATA:  NG tube placement EXAM: ABDOMEN - 1 VIEW COMPARISON:  07/22/2021 FINDINGS: Esophageal tube tip overlies the proximal stomach, side-port in the region of GE junction. Endotracheal tube tip about 3 cm superior to the carina. Gas-filled slightly distended small bowel in the left upper quadrant. IMPRESSION: Esophageal tube tip overlies the proximal stomach, side-port in the region of GE junction, further advancement could be considered for more optimal positioning. Electronically Signed   By: Donavan Foil M.D.   On: 07/23/2021 20:20   ECHOCARDIOGRAM COMPLETE  Result Date: 07/23/2021     ECHOCARDIOGRAM REPORT   Patient Name:   [name] Date of Exam: 07/23/2021 Medical Rec #:  [id number]       Height:       61.0 in Accession #:    [id number]      Weight:       215.0 lb Date of Birth:  [birth year]       BSA:          1.948 m Patient Age:    66 years        BP:           110/61 mmHg Patient Gender: F               HR:           82 bpm. Exam Location:  Inpatient Procedure: 2D Echo, Cardiac Doppler, Color Doppler and Intracardiac            Opacification Agent Indications:    Abnormal ECG R94.31                 Murmur R01.1  History:        Patient has no prior history of Echocardiogram examinations.                 Risk Factors:Hypertension and Dyslipidemia.  Sonographer:    Darlina Sicilian RDCS Referring Phys: Idamae Schuller IMPRESSIONS  1. Mid-systolic obliteration of the LV cavity is noted. Left ventricular ejection fraction, by estimation, is >75%. The left ventricle has hyperdynamic function. The left ventricle has no regional wall motion abnormalities. There is mild left ventricular hypertrophy. Left ventricular diastolic parameters are consistent with Grade I diastolic dysfunction (impaired relaxation).  2. Right ventricular systolic function is hyperdynamic. The right ventricular size is not well visualized.  3. The mitral valve is grossly normal. No evidence of mitral valve regurgitation.  4. The aortic valve was not well visualized. Aortic valve regurgitation is mild. Aortic valve sclerosis/calcification is present, without any evidence of aortic stenosis.  5. The inferior vena cava is normal in size with greater than 50% respiratory variability, suggesting right atrial pressure of 3 mmHg. Comparison(s): No prior Echocardiogram. FINDINGS  Left Ventricle: Mid-systolic obliteration of the LV cavity is noted. Left ventricular ejection fraction, by estimation, is >75%. The left ventricle has hyperdynamic function. The left ventricle has no regional wall motion abnormalities. Definity contrast agent  was given IV to delineate the left ventricular endocardial borders. The left ventricular internal cavity size was normal in size. There is mild left ventricular hypertrophy. Left ventricular diastolic  parameters are consistent with Grade I  diastolic dysfunction (impaired relaxation). Indeterminate filling pressures. Right Ventricle: The right ventricular size is not well visualized. Right vetricular wall thickness was not well visualized. Right ventricular systolic function is hyperdynamic. Left Atrium: Left atrial size was normal in size. Right Atrium: Right atrial size was normal in size. Pericardium: There is no evidence of pericardial effusion. Mitral Valve: The mitral valve is grossly normal. No evidence of mitral valve regurgitation. Tricuspid Valve: The tricuspid valve is not well visualized. Tricuspid valve regurgitation is not demonstrated. Aortic Valve: The aortic valve was not well visualized. Aortic valve regurgitation is mild. Aortic regurgitation PHT measures 538 msec. Aortic valve sclerosis/calcification is present, without any evidence of aortic stenosis. Pulmonic Valve: The pulmonic valve was not well visualized. Pulmonic valve regurgitation is not visualized. Aorta: Aortic root could not be assessed. Venous: The inferior vena cava is normal in size with greater than 50% respiratory variability, suggesting right atrial pressure of 3 mmHg. IAS/Shunts: The interatrial septum was not well visualized.   Diastology LV e' medial:    5.00 cm/s LV E/e' medial:  12.7 LV e' lateral:   4.79 cm/s LV E/e' lateral: 13.2  RIGHT VENTRICLE RV S prime:     24.40 cm/s TAPSE (M-mode): 1.5 cm LEFT ATRIUM             Index        RIGHT ATRIUM           Index LA Vol (A2C):   39.6 ml 20.33 ml/m  RA Area:     11.50 cm LA Vol (A4C):   47.3 ml 24.28 ml/m  RA Volume:   24.10 ml  12.37 ml/m LA Biplane Vol: 43.8 ml 22.49 ml/m  AORTIC VALVE AI PHT:      538 msec MITRAL VALVE MV Area (PHT): 2.42 cm MV Decel Time: 314 msec  MV E velocity: 63.40 cm/s MV A velocity: 94.30 cm/s MV E/A ratio:  0.67 Lyman Bishop MD Electronically signed by Lyman Bishop MD Signature Date/Time: 07/23/2021/11:44:42 AM    Final    Medications:  sodium chloride 10 mL/hr at 07/24/21 2200   piperacillin-tazobactam (ZOSYN)  IV 2.25 g (07/25/21 0230)   propofol (DIPRIVAN) infusion Stopped (07/23/21 0540)   sodium chloride      Chlorhexidine Gluconate Cloth  6 each Topical Daily   heparin injection (subcutaneous)  5,000 Units Subcutaneous Q8H   hydrocortisone sod succinate (SOLU-CORTEF) inj  50 mg Intravenous Q12H   mouth rinse  15 mL Mouth Rinse Q2H   pantoprazole (PROTONIX) IV  40 mg Intravenous Q12H   sodium chloride flush  10-40 mL Intracatheter Q12H   sodium chloride flush  3 mL Intravenous Q12H    Assessment/Plan: 1.  Acute kidney injury chronic kidney disease stage IIIa: Appears consistent with ATN of septic shock.  ~Anuric overnight and worsening azotemia/metabolic acidosis.  Recommend initiation of RRT at this point.  She's hemodynamically stable and will plan for HD later today after [facility] placed by PCCM team - appreciated.   D/w sister on the phone who consents.  Hopefully will be temporary HD until she has renal recovery. Avoid nephrotoxic medications including NSAIDs and iodinated intravenous contrast exposure unless the latter is absolutely indicated.  Preferred narcotic agents for pain control are hydromorphone, fentanyl, and methadone. Morphine should not be used. Avoid Baclofen and avoid oral sodium phosphate and magnesium citrate based laxatives / bowel preps. Continue strict Input and Output monitoring. Will monitor the patient closely with you  and intervene or adjust therapy as indicated by changes in clinical status/labs.  2.  Anion gap metabolic acidosis: Secondary to acute kidney injury.  RRT per above.  In meantime will give 1 amp bicarb as anticipate HD much later today. 3.  Incarcerated ventral hernia: Status post  exploratory laparotomy with small bowel resection, adhesiolysis and hernia repair with JP drain placement.  Ongoing follow-up by surgery. 4.  Peritonitis: With resolution of septic shock, weaned off of pressors and remains on corticosteroids.  On intravenous Zosyn. 5.  Anemia: Possibly chronic from malabsorption given prior history of small bowel resection.  Exacerbated by acute/critical illness and perioperative blood losses.  Hb in the 9s currently.   Jannifer Hick MD 07/25/2021, 7:58 AM  [facility] Pager: [phone]

## 2021-07-25 NOTE — Progress Notes (Signed)
Patient ID: [name], female   DOB: [birth year], 66 y.o.   MRN: [id number] [facility] Progress Note  3 Days Post-Op  Subjective: Still on vent.  To start HD today.  NGT with 700cc yesterday  Objective: Vital signs in last 24 hours: Temp:  [97.6 F (36.4 C)-98 F (36.7 C)] 97.6 F (36.4 C) (07/10 0808) Pulse Rate:  [63-83] 69 (07/10 0800) Resp:  [12-26] 18 (07/10 0800) BP: (87-159)/(49-72) 132/59 (07/10 0414) SpO2:  [91 %-100 %] 97 % (07/10 0800) Arterial Line BP: (127-184)/(41-75) 173/62 (07/10 0800) FiO2 (%):  [30 %-40 %] 40 % (07/10 0855) Weight:  [89.5 kg] 89.5 kg (07/10 0530) Last BM Date :  (pta)  Intake/Output from previous day: 07/09 0701 - 07/10 0700 In: 2198.6 [I.V.:2018.6; NG/GT:30; IV Piggyback:150] Out: 32 [Urine:30; Emesis/NG output:700; Drains:40] Intake/Output this shift: Total I/O In: 20 [I.V.:10; IV Piggyback:10] Out: -   PE: Gen:  somewhat awake on the vent, NAD Pulm:  mechanically ventilated Abd: soft, mild diffuse tenderness without rebound or guarding, no peritonitis, JP serosanguinous. Honeycomb dressing with small amount of blood at distal aspect. ecchymosis noted on proximal thighs and pannus.  NGT with bilious output  Lab Results:  Recent Labs    07/24/21 0524 07/25/21 0343  WBC 10.9* 13.5*  HGB 8.8* 9.2*  HCT 26.5* 26.3*  PLT 142* 136*   BMET Recent Labs    07/24/21 0524 07/25/21 0343  NA 141 142  K 4.1 4.7  CL 105 107  CO2 15* 14*  GLUCOSE 137* 97  BUN 96* 108*  CREATININE 7.68* 8.49*  CALCIUM 6.2* 6.8*   PT/INR Recent Labs    07/23/21 0527  LABPROT 20.8*  INR 1.8*   CMP     Component Value Date/Time   NA 142 07/25/2021 0343   NA 146 (H) 07/11/2017 1547   K 4.7 07/25/2021 0343   CL 107 07/25/2021 0343   CO2 14 (L) 07/25/2021 0343   GLUCOSE 97 07/25/2021 0343   BUN 108 (H) 07/25/2021 0343   BUN 14 07/11/2017 1547   CREATININE 8.49 (H) 07/25/2021 0343   CREATININE 1.04 05/06/2014 1538    CALCIUM 6.8 (L) 07/25/2021 0343   PROT 5.0 (L) 07/23/2021 0319   PROT 6.1 07/11/2017 1547   ALBUMIN 2.4 (L) 07/23/2021 0319   ALBUMIN 3.5 (L) 07/11/2017 1547   AST 36 07/23/2021 0319   ALT 34 07/23/2021 0319   ALKPHOS 36 (L) 07/23/2021 0319   BILITOT 1.0 07/23/2021 0319   BILITOT <0.2 07/11/2017 1547   GFRNONAA 5 (L) 07/25/2021 0343   GFRAA 50 (L) 09/13/2019 0755   Lipase     Component Value Date/Time   LIPASE 24 07/21/2021 2056       Studies/Results: DG Chest Port 1 View  Result Date: 07/24/2021 CLINICAL DATA:  Respirator dependent Evaluate atelectasis. pleural effusion. pneumothorax EXAM: PORTABLE CHEST 1 VIEW COMPARISON:  07/23/2021 and older studies. FINDINGS: Mild increase in right lung base opacity. Stable mild medial left lung base opacity. Remainder of the lungs is clear. Possible small right effusion.  No pneumothorax. Endotracheal tube, nasal/orogastric tube and right internal jugular central venous catheter are stable in well positioned. IMPRESSION: 1. Mild increase in right lung base opacity, likely due to atelectasis. Possible small right effusion. 2. No other change. 3. Stable well-positioned support apparatus. Electronically Signed   By: Lajean Manes M.D.   On: 07/24/2021 08:38   DG Abd 1 View  Result Date: 07/23/2021 CLINICAL DATA:  NG tube placement  EXAM: ABDOMEN - 1 VIEW COMPARISON:  07/22/2021 FINDINGS: Esophageal tube tip overlies the proximal stomach, side-port in the region of GE junction. Endotracheal tube tip about 3 cm superior to the carina. Gas-filled slightly distended small bowel in the left upper quadrant. IMPRESSION: Esophageal tube tip overlies the proximal stomach, side-port in the region of GE junction, further advancement could be considered for more optimal positioning. Electronically Signed   By: Donavan Foil M.D.   On: 07/23/2021 20:20   ECHOCARDIOGRAM COMPLETE  Result Date: 07/23/2021    ECHOCARDIOGRAM REPORT   Patient Name:   [name]  Date of Exam: 07/23/2021 Medical Rec #:  [id number]       Height:       61.0 in Accession #:    [id number]      Weight:       215.0 lb Date of Birth:  [birth year]       BSA:          1.948 m Patient Age:    64 years        BP:           110/61 mmHg Patient Gender: F               HR:           82 bpm. Exam Location:  Inpatient Procedure: 2D Echo, Cardiac Doppler, Color Doppler and Intracardiac            Opacification Agent Indications:    Abnormal ECG R94.31                 Murmur R01.1  History:        Patient has no prior history of Echocardiogram examinations.                 Risk Factors:Hypertension and Dyslipidemia.  Sonographer:    Darlina Sicilian RDCS Referring Phys: Idamae Schuller IMPRESSIONS  1. Mid-systolic obliteration of the LV cavity is noted. Left ventricular ejection fraction, by estimation, is >75%. The left ventricle has hyperdynamic function. The left ventricle has no regional wall motion abnormalities. There is mild left ventricular hypertrophy. Left ventricular diastolic parameters are consistent with Grade I diastolic dysfunction (impaired relaxation).  2. Right ventricular systolic function is hyperdynamic. The right ventricular size is not well visualized.  3. The mitral valve is grossly normal. No evidence of mitral valve regurgitation.  4. The aortic valve was not well visualized. Aortic valve regurgitation is mild. Aortic valve sclerosis/calcification is present, without any evidence of aortic stenosis.  5. The inferior vena cava is normal in size with greater than 50% respiratory variability, suggesting right atrial pressure of 3 mmHg. Comparison(s): No prior Echocardiogram. FINDINGS  Left Ventricle: Mid-systolic obliteration of the LV cavity is noted. Left ventricular ejection fraction, by estimation, is >75%. The left ventricle has hyperdynamic function. The left ventricle has no regional wall motion abnormalities. Definity contrast agent was given IV to delineate the left ventricular  endocardial borders. The left ventricular internal cavity size was normal in size. There is mild left ventricular hypertrophy. Left ventricular diastolic parameters are consistent with Grade I  diastolic dysfunction (impaired relaxation). Indeterminate filling pressures. Right Ventricle: The right ventricular size is not well visualized. Right vetricular wall thickness was not well visualized. Right ventricular systolic function is hyperdynamic. Left Atrium: Left atrial size was normal in size. Right Atrium: Right atrial size was normal in size. Pericardium: There is no evidence of pericardial effusion. Mitral Valve: The mitral valve is  grossly normal. No evidence of mitral valve regurgitation. Tricuspid Valve: The tricuspid valve is not well visualized. Tricuspid valve regurgitation is not demonstrated. Aortic Valve: The aortic valve was not well visualized. Aortic valve regurgitation is mild. Aortic regurgitation PHT measures 538 msec. Aortic valve sclerosis/calcification is present, without any evidence of aortic stenosis. Pulmonic Valve: The pulmonic valve was not well visualized. Pulmonic valve regurgitation is not visualized. Aorta: Aortic root could not be assessed. Venous: The inferior vena cava is normal in size with greater than 50% respiratory variability, suggesting right atrial pressure of 3 mmHg. IAS/Shunts: The interatrial septum was not well visualized.   Diastology LV e' medial:    5.00 cm/s LV E/e' medial:  12.7 LV e' lateral:   4.79 cm/s LV E/e' lateral: 13.2  RIGHT VENTRICLE RV S prime:     24.40 cm/s TAPSE (M-mode): 1.5 cm LEFT ATRIUM             Index        RIGHT ATRIUM           Index LA Vol (A2C):   39.6 ml 20.33 ml/m  RA Area:     11.50 cm LA Vol (A4C):   47.3 ml 24.28 ml/m  RA Volume:   24.10 ml  12.37 ml/m LA Biplane Vol: 43.8 ml 22.49 ml/m  AORTIC VALVE AI PHT:      538 msec MITRAL VALVE MV Area (PHT): 2.42 cm MV Decel Time: 314 msec MV E velocity: 63.40 cm/s MV A velocity: 94.30  cm/s MV E/A ratio:  0.67 Lyman Bishop MD Electronically signed by Lyman Bishop MD Signature Date/Time: 07/23/2021/11:44:42 AM    Final     Anti-infectives: Anti-infectives (From admission, onward)    Start     Dose/Rate Route Frequency Ordered Stop   07/23/21 1730  piperacillin-tazobactam (ZOSYN) IVPB 2.25 g        2.25 g 100 mL/hr over 30 Minutes Intravenous Every 8 hours 07/23/21 1207 07/28/21 1729   07/22/21 0900  piperacillin-tazobactam (ZOSYN) IVPB 2.25 g  Status:  Discontinued        2.25 g 100 mL/hr over 30 Minutes Intravenous Every 8 hours 07/22/21 0833 07/23/21 1207   07/22/21 0830  piperacillin-tazobactam (ZOSYN) IVPB 3.375 g  Status:  Discontinued        3.375 g 100 mL/hr over 30 Minutes Intravenous Every 8 hours 07/22/21 0827 07/22/21 0831   07/22/21 0000  piperacillin-tazobactam (ZOSYN) IVPB 3.375 g        3.375 g 100 mL/hr over 30 Minutes Intravenous  Once 07/21/21 2352 07/22/21 0142        Assessment/Plan POD#3 s/p  exploratory laparotomy, small bowel resection, adhesiolysis x90 minutes, primary repair of incarcerated ventral hernia, JP drain placement 7/7 Dr. Bobbye Morton for incarcerated and strangulated ventral hernia - monitor JP, serosanguinous - Continue NPO/NGT to LIWS, AROBF - needs 4 days zosyn postop - Monitor WBC, 13.5K today   ID - zosyn 7/7>> FEN - IVF, NPO/NGT to LIWS VTE - sq heparin Foley - continue   Septic shock - off pressors 7/9 ARF - HD to start today VDRF - per CCM may be ready for extubation trial soon. Continue NG if extubated Elevated troponin - cards has seen, suspect secondary to critical illness and not ACS HTN Anemia - hgb stable     LOS: 3 days    Henreitta Cea, [facility] 07/25/2021, 8:57 AM Please see Amion for pager number during day hours 7:00am-4:30pm

## 2021-07-26 DIAGNOSIS — N179 Acute kidney failure, unspecified: Secondary | ICD-10-CM | POA: Diagnosis not present

## 2021-07-26 DIAGNOSIS — K56609 Unspecified intestinal obstruction, unspecified as to partial versus complete obstruction: Secondary | ICD-10-CM

## 2021-07-26 DIAGNOSIS — E876 Hypokalemia: Secondary | ICD-10-CM | POA: Diagnosis not present

## 2021-07-26 DIAGNOSIS — K436 Other and unspecified ventral hernia with obstruction, without gangrene: Secondary | ICD-10-CM | POA: Diagnosis not present

## 2021-07-26 LAB — HEPATITIS B SURFACE ANTIBODY, QUANTITATIVE: Hep B S AB Quant (Post): <3.1 m[IU]/mL — ABNORMAL LOW (ref >9.9)

## 2021-07-26 LAB — CBC WITH DIFFERENTIAL/PLATELET
Abs Immature Granulocytes: 0.11 10*3/uL — ABNORMAL HIGH (ref 0.00–0.07)
Basophils Absolute: 0 10*3/uL (ref 0.0–0.1)
Basophils Relative: 0 %
Eosinophils Absolute: 0 10*3/uL (ref 0.0–0.5)
Eosinophils Relative: 0 %
HCT: 24.6 % — ABNORMAL LOW (ref 36.0–46.0)
Hemoglobin: 8.6 g/dL — ABNORMAL LOW (ref 12.0–15.0)
Immature Granulocytes: 1 %
Lymphocytes Relative: 9 %
Lymphs Abs: 1 10*3/uL (ref 0.7–4.0)
MCH: 31.7 pg (ref 26.0–34.0)
MCHC: 35 g/dL (ref 30.0–36.0)
MCV: 90.8 fL (ref 80.0–100.0)
Monocytes Absolute: 1.1 10*3/uL — ABNORMAL HIGH (ref 0.1–1.0)
Monocytes Relative: 9 %
Neutro Abs: 8.9 10*3/uL — ABNORMAL HIGH (ref 1.7–7.7)
Neutrophils Relative %: 81 %
Platelets: 130 10*3/uL — ABNORMAL LOW (ref 150–400)
RBC: 2.71 MIL/uL — ABNORMAL LOW (ref 3.87–5.11)
RDW: 18.3 % — ABNORMAL HIGH (ref 11.5–15.5)
WBC: 11.2 10*3/uL — ABNORMAL HIGH (ref 4.0–10.5)
nRBC: 0.4 % — ABNORMAL HIGH (ref 0.0–0.2)

## 2021-07-26 LAB — RENAL FUNCTION PANEL
Albumin: 1.7 g/dL — ABNORMAL LOW (ref 3.5–5.0)
Anion gap: 19 — ABNORMAL HIGH (ref 5–15)
BUN: 78 mg/dL — ABNORMAL HIGH (ref 8–23)
CO2: 20 mmol/L — ABNORMAL LOW (ref 22–32)
Calcium: 7.4 mg/dL — ABNORMAL LOW (ref 8.9–10.3)
Chloride: 102 mmol/L (ref 98–111)
Creatinine, Ser: 6.77 mg/dL — ABNORMAL HIGH (ref 0.44–1.00)
GFR, Estimated: 6 mL/min — ABNORMAL LOW (ref >60)
Glucose, Bld: 90 mg/dL (ref 70–99)
Phosphorus: 6.7 mg/dL — ABNORMAL HIGH (ref 2.5–4.6)
Potassium: 4.1 mmol/L (ref 3.5–5.1)
Sodium: 141 mmol/L (ref 135–145)

## 2021-07-26 LAB — MAGNESIUM: Magnesium: 1.9 mg/dL (ref 1.7–2.4)

## 2021-07-26 LAB — CULTURE, BLOOD (ROUTINE X 2): Culture: NO GROWTH

## 2021-07-26 LAB — GLUCOSE, CAPILLARY
Glucose-Capillary: 89 mg/dL (ref 70–99)
Glucose-Capillary: 87 mg/dL (ref 70–99)
Glucose-Capillary: 90 mg/dL (ref 70–99)
Glucose-Capillary: 71 mg/dL (ref 70–99)
Glucose-Capillary: 96 mg/dL (ref 70–99)

## 2021-07-26 LAB — SURGICAL PATHOLOGY

## 2021-07-26 MED ORDER — FENTANYL CITRATE (PF) 100 MCG/2ML IJ SOLN
25.0000 ug | INTRAMUSCULAR | Status: DC | PRN
  Administered 2021-07-26 – 2021-07-27 (×3): 25 ug via INTRAVENOUS
  Filled 2021-07-26 (×3): qty 2

## 2021-07-26 MED ORDER — BACITRACIN-NEOMYCIN-POLYMYXIN OINTMENT TUBE
TOPICAL_OINTMENT | Freq: Three times a day (TID) | CUTANEOUS | Status: DC
  Administered 2021-07-26 – 2021-08-15 (×6): 1 via TOPICAL
  Filled 2021-07-26 (×2): qty 14

## 2021-07-26 MED ORDER — PANTOPRAZOLE SODIUM 40 MG IV SOLR
40.0000 mg | INTRAVENOUS | Status: DC
  Administered 2021-07-27 – 2021-07-30 (×4): 40 mg via INTRAVENOUS
  Filled 2021-07-26 (×4): qty 10

## 2021-07-26 MED ORDER — ORAL CARE MOUTH RINSE: 15.0000 mL | OROMUCOSAL | Status: DC | PRN

## 2021-07-26 MED ORDER — ORAL CARE MOUTH RINSE
15.0000 mL | OROMUCOSAL | Status: DC
  Administered 2021-07-26 (×3): 15 mL via OROMUCOSAL

## 2021-07-26 NOTE — Progress Notes (Signed)
[name], MRN:  [id number], DOB:  04/19/1955, LOS: 4 ADMISSION DATE:  07/21/2021, CONSULTATION DATE:  07/22/21 REFERRING MD:  EDP, CHIEF COMPLAINT:  Abdominal Pain   History of Present Illness:  [name] is a 66 y.o. 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  She presented to the ED with severe abdominal pain and vomiting on 7/7.  Labs were significant for Creatinine of 5.28, lactic acid 6.5, WBC 11.6 and CT abd/pelvis with SBO, large lower abdominal ventral wall hernia containing most of the small bowel.    Surgery was consulted and she was taken to the OR for emergent exlap and hernia repair.  Post-op pt was transferred to the ICU and PCCM consulted.   Pertinent  Medical History  Arthritis, HLD, HTN, Hx of ventral hernia  Significant Hospital Events: Including procedures, antibiotic start and stop dates in addition to other pertinent events   7/07-presented to the ED with abdominal pain and SBO, to OR for ex-lap, txfr to ICU  7/08 elevated troponin >> cardiology consulted 7/09 off pressors; renal consulted 07/10 On pressure support this am; HD catheter placed 07/11 extubated Interim History / Subjective:  Intubated but not sedated. Doing well. Denying any acute concerns.   Objective: MAP>65. Afebrile.   Blood pressure (!) 110/53, pulse 86, temperature 99.1 F (37.3 C), temperature source Oral, resp. rate 14, height 5\' 1"  (1.549 m), weight 96.2 kg, SpO2 100 %.  On 50 %FiO2.     Vent Mode: PSV;CPAP FiO2 (%):  [40 %] 40 % Set Rate:  [18 bmp] 18 bmp Vt Set:  [380 mL] 380 mL PEEP:  [5 cmH20] 5 cmH20 Pressure Support:  [8 cmH20] 8 cmH20 Plateau Pressure:  [8 cmH20-16 cmH20] 16 cmH20   Intake/Output Summary (Last 24 hours) at 07/26/2021 0941 Last data filed at 07/26/2021 0900 Gross per 24 hour  Intake 224.01 ml   Output 545 ml  Net -320.99 ml   Filed Weights   07/25/21 0530 07/25/21 1710 07/25/21 2033  Weight: 89.5 kg 95.7 kg 96.2 kg   Intake: 230 cc Urine 0 cc Emesis/NG output 700 cc dark output Drains 45  Total output 745 Net -514 cc  Examination:  General: NAD, laying in bed, intubated Eyes: PERRL ENT: intubated Cardiac: NSR, normal rate Resp: CTAB Abdomen: No TTP, diminished bowel sounds, Drain placed with blood tinged output  Extremities: Symmetric, No LE edema Skin: ecchymosis is resolving Neuro: follows all commands, moving all extremities.   Resolved Hospital Problem list   Septic shock  Assessment & Plan:  #Incarcerated and strangulated ventral hernia s/p SB resection, lysis of adhesions, repair of hernia, and JP placement. #Peritonitis  Abx day 5, currently on zosyn. Last day of abx. Leukocytosis slightly worse but patient received steroids. Surgery following; post op care, nutrition per surgery, plan to start in the next few days. Continue NG tube to suction and continue to monitor drain output. -CTM, last day of abx.  -Will reach out to surgery if patient does not have nutrition ordered by tomorrow.   #AKI from ATN 2nd to shock. #Gap/Non gap metabolic acidosis. Baseline creatinine 0.88 from 03/25/21. Minimal urine outpt and renal fx getting worse. Cr 8.5 this am. Nephrology consulted and following. Bicarb was low at 14 and 1 amp of bicarb given this morning. Got HD once yesterday with plan for iHD as needed.  -  Nephrology following: plan for iHD.  -Continue to monitor electrolytes  #Anemia of critical illness and chronic disease. Hgb 8.6. Stable. -Will CTM on repeat CBC.  - transfuse for Hb < 7  #Hx of HTN Patient normotensive. Home amlodipine held.  -CTM  #Class II Obesity -Counsel on lifestyle modifications when patient is stable.   Best Practice (right click and "Reselect all SmartList Selections" daily)  Diet/type: NPO DVT prophylaxis: prophylactic  heparin  GI prophylaxis: PPI Lines: yes and it is still needed Foley:  Yes, and it is still needed Continuous: None   Code Status:  full code Last date of multidisciplinary goals of care discussion [updated family at bedside on 7/09]  Labs: CBC improved leukocytosis at 11k, Hgb at 8.6, Plt 130. Renal fxn shows K 4.1, bicarb 20, creatinine improved to 6.7 from 8.5 with HD, BUN improved 78 from 108. Phosphorus elevated at 6.7. Mag at 1.9.      Latest Ref Rng & Units 07/26/2021    5:56 AM 07/25/2021    3:43 AM 07/24/2021    5:24 AM  CMP  Glucose 70 - 99 mg/dL 90  97  137   BUN 8 - 23 mg/dL 78  108  96   Creatinine 0.44 - 1.00 mg/dL 6.77  8.49  7.68   Sodium 135 - 145 mmol/L 141  142  141   Potassium 3.5 - 5.1 mmol/L 4.1  4.7  4.1   Chloride 98 - 111 mmol/L 102  107  105   CO2 22 - 32 mmol/L 20  14  15    Calcium 8.9 - 10.3 mg/dL 7.4  6.8  6.2        Latest Ref Rng & Units 07/26/2021    5:56 AM 07/25/2021    3:43 AM 07/24/2021    5:24 AM  CBC  WBC 4.0 - 10.5 K/uL 11.2  13.5  10.9   Hemoglobin 12.0 - 15.0 g/dL 8.6  9.2  8.8   Hematocrit 36.0 - 46.0 % 24.6  26.3  26.5   Platelets 150 - 400 K/uL 130  136  142     ABG    Component Value Date/Time   PHART 7.279 (L) 07/24/2021 0507   PCO2ART 34.3 07/24/2021 0507   PO2ART 178 (H) 07/24/2021 0507   HCO3 16.1 (L) 07/24/2021 0507   TCO2 17 (L) 07/24/2021 0507   ACIDBASEDEF 10.0 (H) 07/24/2021 0507   O2SAT 99 07/24/2021 0507    CBG (last 3)  Recent Labs    07/25/21 2341 07/26/21 0334 07/26/21 0752  GLUCAP 90 89 87   Home Medications         Prior to Admission medications   Medication Sig Start Date End Date Taking? Authorizing Provider  amLODipine (NORVASC) 10 MG tablet TAKE 1 TABLET(10 MG) BY MOUTH DAILY 07/11/17   Yes Weber, Sarah L, PA-C  amoxicillin-clavulanate (AUGMENTIN) 875-125 MG tablet Take 1 tablet by mouth every 12 (twelve) hours. Patient not taking: Reported on 07/22/2021 09/13/19     Joy, Raquel Sarna C, PA-C  ibuprofen  (ADVIL) 800 MG tablet TAKE 1 TABLET(800 MG) BY MOUTH EVERY 8 HOURS AS NEEDED Patient not taking: Reported on 07/22/2021 07/29/18     Aundra Dubin, PA-C  methocarbamol (ROBAXIN) 500 MG tablet Take 1 tablet (500 mg total) by mouth 2 (two) times daily as needed. Patient not taking: Reported on 07/22/2021 01/06/19     Aundra Dubin, PA-C  ondansetron (ZOFRAN ODT) 4 MG disintegrating tablet Take 1 tablet (4  mg total) by mouth every 8 (eight) hours as needed for nausea or vomiting. Patient not taking: Reported on 07/22/2021 09/13/19     Lorayne Bender, PA-C    Idamae Schuller, MD Tillie Rung. [facility] Internal Medicine Residency, PGY-2

## 2021-07-26 NOTE — Progress Notes (Signed)
[facility] Progress Note   Subjective:   Remains intubated in ICU.  Anuric yesterday.  Had HD #1 yesterday.  Hemodynamically stable.  Objective Vitals:   07/26/21 0430 07/26/21 0500 07/26/21 0530 07/26/21 0600  BP:      Pulse: 79 79 75 79  Resp: 18 18 18 18   Temp:      TempSrc:      SpO2: 100% 100% 100% 100%  Weight:      Height:       Physical Exam General: intubated, sedated Heart: RRR Lungs: coarse on vent, no rales Abdomen: soft, midline dressing c/d/I, JP drain in place; NG to suction Extremities: minor edema Dialysis Access:  none, RIJ TLC removed, LIJ temp HD cath  Additional Objective Labs: Basic Metabolic Panel: Recent Labs  Lab 07/23/21 0319 07/24/21 0507 07/24/21 0524 07/25/21 0343  NA 144 141 141 142  K 4.0 4.1 4.1 4.7  CL 105  --  105 107  CO2 18*  --  15* 14*  GLUCOSE 102*  --  137* 97  BUN 86*  --  96* 108*  CREATININE 6.42*  --  7.68* 8.49*  CALCIUM 6.8*  --  6.2* 6.8*    Liver Function Tests: Recent Labs  Lab 07/21/21 2056 07/23/21 0319  AST 27 36  ALT 17 34  ALKPHOS 56 36*  BILITOT 0.6 1.0  PROT 6.0* 5.0*  ALBUMIN 2.7* 2.4*    Recent Labs  Lab 07/21/21 2056  LIPASE 24    CBC: Recent Labs  Lab 07/21/21 2056 07/22/21 1620 07/22/21 1945 07/22/21 2128 07/23/21 0319 07/23/21 0919 07/24/21 0507 07/24/21 0524 07/25/21 0343  WBC 11.6*  --  2.7*  --  7.0 11.5*  --  10.9* 13.5*  NEUTROABS 9.0*  --   --   --   --  9.0*  --   --   --   HGB 13.2   < > 5.4*   < > 10.2* 10.0* 8.8* 8.8* 9.2*  HCT 40.8   < > 16.1*   < > 29.8* 29.4* 26.0* 26.5* 26.3*  MCV 96.7  --  95.8  --  94.0 93.3  --  93.0 89.8  PLT 272  --  82*  --  159 202  --  142* 136*   < > = values in this interval not displayed.    Blood Culture    Component Value Date/Time   SDES BLOOD RIGHT ANTECUBITAL 07/21/2021 1915   SPECREQUEST  07/21/2021 1915    BOTTLES DRAWN AEROBIC ONLY Blood Culture results may not be optimal due to an inadequate volume  of blood received in culture bottles   CULT  07/21/2021 1915    NO GROWTH 5 DAYS Performed at [facility], [place] [address] [place], [place] [address]    REPTSTATUS 07/26/2021 FINAL 07/21/2021 1915    Cardiac Enzymes: Recent Labs  Lab 07/22/21 0246  CKTOTAL 304*    CBG: Recent Labs  Lab 07/25/21 1129 07/25/21 1553 07/25/21 1944 07/25/21 2341 07/26/21 0334  GLUCAP 84 83 99 90 89    Iron Studies: No results for input(s): "IRON", "TIBC", "TRANSFERRIN", "FERRITIN" in the last 72 hours. @lablastinr3 @ Studies/Results: DG CHEST PORT 1 VIEW  Result Date: 07/25/2021 CLINICAL DATA:  Central line placement. EXAM: PORTABLE CHEST 1 VIEW COMPARISON:  07/24/2021. FINDINGS: Left IJ central line tip is likely up against the lateral wall of the SVC. Right IJ central line tip is in the SVC. Nasogastric tube is followed into  the stomach with the tip projecting beyond the inferior margin of the image. Endotracheal to terminates approximately 4.6 cm above the carina. Heart size stable. Bibasilar collapse/consolidation with a left pleural effusion. No pneumothorax. Biapical pleural thickening. IMPRESSION: 1. Left IJ central line placement without complicating feature. 2. Bibasilar collapse/consolidation.  Pneumonia cannot be excluded. 3. Left pleural effusion. Electronically Signed   By: Lorin Picket M.D.   On: 07/25/2021 15:13   Medications:  sodium chloride 10 mL/hr at 07/25/21 1800   piperacillin-tazobactam (ZOSYN)  IV 2.25 g (07/26/21 0238)   sodium chloride      Chlorhexidine Gluconate Cloth  6 each Topical Daily   Chlorhexidine Gluconate Cloth  6 each Topical Q0600   heparin injection (subcutaneous)  5,000 Units Subcutaneous Q8H   hydrocortisone sod succinate (SOLU-CORTEF) inj  50 mg Intravenous Daily   mouth rinse  15 mL Mouth Rinse Q2H   pantoprazole (PROTONIX) IV  40 mg Intravenous Q12H   sodium chloride flush  10-40 mL Intracatheter Q12H   sodium chloride flush  3 mL  Intravenous Q12H    Assessment/Plan: 1.  Acute kidney injury chronic kidney disease stage IIIa: Appears consistent with ATN of septic shock.  Initiated HD 7/10 for worsening azotemia/metabolic acidosis. Hopefully will be temporary HD until she has renal recovery.  She remains anuric.  Labs this AM acceptable  - will plan for next HD tomorrow AM.  Avoid nephrotoxic medications including NSAIDs and iodinated intravenous contrast exposure unless the latter is absolutely indicated.  Preferred narcotic agents for pain control are hydromorphone, fentanyl, and methadone. Morphine should not be used. Avoid Baclofen and avoid oral sodium phosphate and magnesium citrate based laxatives / bowel preps. Continue strict Input and Output monitoring. Will monitor the patient closely with you and intervene or adjust therapy as indicated by changes in clinical status/labs.  2.  Anion gap metabolic acidosis: Secondary to acute kidney injury.  RRT per above. . 3.  Incarcerated ventral hernia: Status post exploratory laparotomy with small bowel resection, adhesiolysis and hernia repair with JP drain placement.  Ongoing follow-up by surgery. 4.  Peritonitis: With resolution of septic shock, weaned off of pressors and remains on corticosteroids.  On intravenous Zosyn. 5.  Anemia: Possibly chronic from malabsorption given prior history of small bowel resection.  Exacerbated by acute/critical illness and perioperative blood losses.  Hb in the 8-9s currently.   Jannifer Hick MD 07/26/2021, 7:24 AM  [facility] Pager: [phone]

## 2021-07-26 NOTE — Progress Notes (Signed)
Patient with multiple blister areas on abdomen and buttocks. Antibiotic ointment applied to raw areas per orders and foam also applied .

## 2021-07-26 NOTE — Procedures (Signed)
Extubation Procedure Note  Patient Details:   Name: [name] DOB: 1955/07/22 MRN: [id number]   Airway Documentation:    Vent end date: 07/26/21 Vent end time: 1011   Evaluation  O2 sats: stable throughout Complications: No apparent complications Patient did tolerate procedure well. Bilateral Breath Sounds: Clear   Yes  Pt was extubated per MD order and placed on 2 L [place]. Cuff leak was noted prior to extubation and no stridor post. Pt is stable at this time. RT will monitor.   Ronaldo Miyamoto 07/26/2021, 10:12 AM

## 2021-07-26 NOTE — Progress Notes (Signed)
Patient ID: [name], female   DOB: 1955-07-15, 66 y.o.   MRN: [id number] [facility] Surgery Progress Note  4 Days Post-Op  Subjective: Still on vent.  Had first session of HD yesterday.  NGT with 700cc yesterday total.  No BM per RN  Objective: Vital signs in last 24 hours: Temp:  [97.6 F (36.4 C)-99.1 F (37.3 C)] 99.1 F (37.3 C) (07/11 0754) Pulse Rate:  [70-118] 118 (07/11 0831) Resp:  [12-21] 15 (07/11 0831) BP: (88-160)/(50-69) 110/53 (07/10 2021) SpO2:  [100 %] 100 % (07/11 0831) Arterial Line BP: (96-189)/(46-68) 136/62 (07/11 0831) FiO2 (%):  [40 %] 40 % (07/11 0831) Weight:  [95.7 kg-96.2 kg] 96.2 kg (07/10 2033) Last BM Date :  (pta)  Intake/Output from previous day: 07/10 0701 - 07/11 0700 In: 240.1 [I.V.:80; IV Piggyback:160.1] Out: 745 [Emesis/NG output:700; Drains:45] Intake/Output this shift: Total I/O In: 10 [I.V.:10] Out: -   PE: Gen:  somewhat awake on the vent, NAD Pulm:  mechanically ventilated Abd: soft, mild diffuse tenderness without rebound or guarding, no peritonitis, JP serosanguinous. Honeycomb dressing with small amount of blood at distal aspect. ecchymosis noted on proximal thighs and pannus and inframammary fold on right side.  NGT with bilious output, 700cc total yesterday.  Hypoactive BS  Lab Results:  Recent Labs    07/25/21 0343 07/26/21 0556  WBC 13.5* 11.2*  HGB 9.2* 8.6*  HCT 26.3* 24.6*  PLT 136* 130*   BMET Recent Labs    07/25/21 0343 07/26/21 0556  NA 142 141  K 4.7 4.1  CL 107 102  CO2 14* 20*  GLUCOSE 97 90  BUN 108* 78*  CREATININE 8.49* 6.77*  CALCIUM 6.8* 7.4*   PT/INR No results for input(s): "LABPROT", "INR" in the last 72 hours.  CMP     Component Value Date/Time   NA 141 07/26/2021 0556   NA 146 (H) 07/11/2017 1547   K 4.1 07/26/2021 0556   CL 102 07/26/2021 0556   CO2 20 (L) 07/26/2021 0556   GLUCOSE 90 07/26/2021 0556   BUN 78 (H) 07/26/2021 0556   BUN 14 07/11/2017 1547    CREATININE 6.77 (H) 07/26/2021 0556   CREATININE 1.04 05/06/2014 1538   CALCIUM 7.4 (L) 07/26/2021 0556   PROT 5.0 (L) 07/23/2021 0319   PROT 6.1 07/11/2017 1547   ALBUMIN 1.7 (L) 07/26/2021 0556   ALBUMIN 3.5 (L) 07/11/2017 1547   AST 36 07/23/2021 0319   ALT 34 07/23/2021 0319   ALKPHOS 36 (L) 07/23/2021 0319   BILITOT 1.0 07/23/2021 0319   BILITOT <0.2 07/11/2017 1547   GFRNONAA 6 (L) 07/26/2021 0556   GFRAA 50 (L) 09/13/2019 0755   Lipase     Component Value Date/Time   LIPASE 24 07/21/2021 2056       Studies/Results: DG CHEST PORT 1 VIEW  Result Date: 07/25/2021 CLINICAL DATA:  Central line placement. EXAM: PORTABLE CHEST 1 VIEW COMPARISON:  07/24/2021. FINDINGS: Left IJ central line tip is likely up against the lateral wall of the SVC. Right IJ central line tip is in the SVC. Nasogastric tube is followed into the stomach with the tip projecting beyond the inferior margin of the image. Endotracheal to terminates approximately 4.6 cm above the carina. Heart size stable. Bibasilar collapse/consolidation with a left pleural effusion. No pneumothorax. Biapical pleural thickening. IMPRESSION: 1. Left IJ central line placement without complicating feature. 2. Bibasilar collapse/consolidation.  Pneumonia cannot be excluded. 3. Left pleural effusion. Electronically Signed   By: Rip Harbour  Blietz M.D.   On: 07/25/2021 15:13    Anti-infectives: Anti-infectives (From admission, onward)    Start     Dose/Rate Route Frequency Ordered Stop   07/23/21 1730  piperacillin-tazobactam (ZOSYN) IVPB 2.25 g        2.25 g 100 mL/hr over 30 Minutes Intravenous Every 8 hours 07/23/21 1207 07/28/21 1729   07/22/21 0900  piperacillin-tazobactam (ZOSYN) IVPB 2.25 g  Status:  Discontinued        2.25 g 100 mL/hr over 30 Minutes Intravenous Every 8 hours 07/22/21 0833 07/23/21 1207   07/22/21 0830  piperacillin-tazobactam (ZOSYN) IVPB 3.375 g  Status:  Discontinued        3.375 g 100 mL/hr over 30  Minutes Intravenous Every 8 hours 07/22/21 0827 07/22/21 0831   07/22/21 0000  piperacillin-tazobactam (ZOSYN) IVPB 3.375 g        3.375 g 100 mL/hr over 30 Minutes Intravenous  Once 07/21/21 2352 07/22/21 0142        Assessment/Plan POD#4 s/p  exploratory laparotomy, small bowel resection, adhesiolysis x90 minutes, primary repair of incarcerated ventral hernia, JP drain placement 7/7 Dr. Bobbye Morton for incarcerated and strangulated ventral hernia - monitor JP, serosanguinous - Continue NPO/NGT to LIWS, AROBF - needs 4 days zosyn postop, can stop after today - Monitor WBC, down to 11K today from 13K yesterday   ID - zosyn 7/7>> FEN - IVF, NPO/NGT to LIWS VTE - sq heparin Foley - continue   Septic shock - off pressors 7/9 ARF - HD started 7/10 VDRF - per CCM may be ready for extubation trial soon. Continue NG if extubated Elevated troponin - cards has seen, suspect secondary to critical illness and not ACS HTN Anemia - hgb stable     LOS: 4 days    Henreitta Cea, [facility] Surgery 07/26/2021, 8:43 AM Please see Amion for pager number during day hours 7:00am-4:30pm

## 2021-07-27 DIAGNOSIS — N179 Acute kidney failure, unspecified: Secondary | ICD-10-CM | POA: Diagnosis not present

## 2021-07-27 DIAGNOSIS — N1832 Chronic kidney disease, stage 3b: Secondary | ICD-10-CM | POA: Diagnosis not present

## 2021-07-27 DIAGNOSIS — K436 Other and unspecified ventral hernia with obstruction, without gangrene: Secondary | ICD-10-CM | POA: Diagnosis not present

## 2021-07-27 DIAGNOSIS — K56609 Unspecified intestinal obstruction, unspecified as to partial versus complete obstruction: Secondary | ICD-10-CM | POA: Diagnosis not present

## 2021-07-27 LAB — RENAL FUNCTION PANEL
Albumin: 1.8 g/dL — ABNORMAL LOW (ref 3.5–5.0)
Anion gap: 17 — ABNORMAL HIGH (ref 5–15)
BUN: 94 mg/dL — ABNORMAL HIGH (ref 8–23)
CO2: 20 mmol/L — ABNORMAL LOW (ref 22–32)
Calcium: 7.8 mg/dL — ABNORMAL LOW (ref 8.9–10.3)
Chloride: 103 mmol/L (ref 98–111)
Creatinine, Ser: 8.15 mg/dL — ABNORMAL HIGH (ref 0.44–1.00)
GFR, Estimated: 5 mL/min — ABNORMAL LOW (ref >60)
Glucose, Bld: 82 mg/dL (ref 70–99)
Phosphorus: 9.2 mg/dL — ABNORMAL HIGH (ref 2.5–4.6)
Potassium: 4.7 mmol/L (ref 3.5–5.1)
Sodium: 140 mmol/L (ref 135–145)

## 2021-07-27 LAB — CBC WITH DIFFERENTIAL/PLATELET
Abs Immature Granulocytes: 0.17 10*3/uL — ABNORMAL HIGH (ref 0.00–0.07)
Basophils Absolute: 0 10*3/uL (ref 0.0–0.1)
Basophils Relative: 0 %
Eosinophils Absolute: 0.1 10*3/uL (ref 0.0–0.5)
Eosinophils Relative: 1 %
HCT: 25.3 % — ABNORMAL LOW (ref 36.0–46.0)
Hemoglobin: 8.7 g/dL — ABNORMAL LOW (ref 12.0–15.0)
Immature Granulocytes: 2 %
Lymphocytes Relative: 9 %
Lymphs Abs: 1 10*3/uL (ref 0.7–4.0)
MCH: 31.3 pg (ref 26.0–34.0)
MCHC: 34.4 g/dL (ref 30.0–36.0)
MCV: 91 fL (ref 80.0–100.0)
Monocytes Absolute: 1.3 10*3/uL — ABNORMAL HIGH (ref 0.1–1.0)
Monocytes Relative: 13 %
Neutro Abs: 7.9 10*3/uL — ABNORMAL HIGH (ref 1.7–7.7)
Neutrophils Relative %: 75 %
Platelets: 129 10*3/uL — ABNORMAL LOW (ref 150–400)
RBC: 2.78 MIL/uL — ABNORMAL LOW (ref 3.87–5.11)
RDW: 18.4 % — ABNORMAL HIGH (ref 11.5–15.5)
WBC: 10.4 10*3/uL (ref 4.0–10.5)
nRBC: 0.2 % (ref 0.0–0.2)

## 2021-07-27 LAB — GLUCOSE, CAPILLARY
Glucose-Capillary: 107 mg/dL — ABNORMAL HIGH (ref 70–99)
Glucose-Capillary: 78 mg/dL (ref 70–99)
Glucose-Capillary: 83 mg/dL (ref 70–99)
Glucose-Capillary: 88 mg/dL (ref 70–99)
Glucose-Capillary: 89 mg/dL (ref 70–99)
Glucose-Capillary: 94 mg/dL (ref 70–99)

## 2021-07-27 MED ORDER — FENTANYL CITRATE PF 50 MCG/ML IJ SOSY
25.0000 ug | PREFILLED_SYRINGE | INTRAMUSCULAR | Status: DC | PRN
  Administered 2021-07-27: 25 ug via INTRAVENOUS
  Filled 2021-07-27 (×2): qty 1

## 2021-07-27 MED ORDER — LIP MEDEX EX OINT
TOPICAL_OINTMENT | CUTANEOUS | Status: DC | PRN
Start: 2021-07-27 — End: 2021-08-16
  Filled 2021-07-27: qty 7

## 2021-07-27 MED ORDER — FENTANYL CITRATE (PF) 100 MCG/2ML IJ SOLN: 25.0000 ug | INTRAMUSCULAR | Status: DC | PRN

## 2021-07-27 NOTE — Progress Notes (Signed)
Received patient in bed, alert and oriented. Informed consent signed and in chart.  Time tx completed:1258  HD treatment completed. Patient tolerated well. Fistula/Graft/HD catheter without signs and symptoms of complications. Patient transported back to the room, alert and orient and in no acute distress. Report given to bedside RN.  Total UF removed:1.5 L  Medication given:none  Post HD VS:105/60  Post HD weight: 89.2kg

## 2021-07-27 NOTE — Progress Notes (Addendum)
[facility] Progress Note   Subjective:   now extubated.  UOP 76mL  yesterday.  For HD #2 today.  Hemodynamically stable.  Objective Vitals:   07/27/21 1100 07/27/21 1103 07/27/21 1115 07/27/21 1130  BP: 134/72  125/68 118/77  Pulse: 89  98 (!) 139  Resp: 19  (!) 21 (!) 21  Temp:  98.4 F (36.9 C)    TempSrc:  Axillary    SpO2: 97%  99% 99%  Weight:      Height:       Physical Exam General: extubated, calm Heart: RRR Lungs: coarse ant, no rales Abdomen: soft, midline dressing c/d/I, JP drain in place; NG remains but clamped now Extremities: minor edema Dialysis Access:  none,  LIJ temp HD cath  Additional Objective Labs: Basic Metabolic Panel: Recent Labs  Lab 07/25/21 0343 07/26/21 0556 07/27/21 0332  NA 142 141 140  K 4.7 4.1 4.7  CL 107 102 103  CO2 14* 20* 20*  GLUCOSE 97 90 82  BUN 108* 78* 94*  CREATININE 8.49* 6.77* 8.15*  CALCIUM 6.8* 7.4* 7.8*  PHOS  --  6.7* 9.2*    Liver Function Tests: Recent Labs  Lab 07/21/21 2056 07/23/21 0319 07/26/21 0556 07/27/21 0332  AST 27 36  --   --   ALT 17 34  --   --   ALKPHOS 56 36*  --   --   BILITOT 0.6 1.0  --   --   PROT 6.0* 5.0*  --   --   ALBUMIN 2.7* 2.4* 1.7* 1.8*    Recent Labs  Lab 07/21/21 2056  LIPASE 24    CBC: Recent Labs  Lab 07/23/21 0919 07/24/21 0507 07/24/21 0524 07/25/21 0343 07/26/21 0556 07/27/21 0332  WBC 11.5*  --  10.9* 13.5* 11.2* 10.4  NEUTROABS 9.0*  --   --   --  8.9* 7.9*  HGB 10.0*   < > 8.8* 9.2* 8.6* 8.7*  HCT 29.4*   < > 26.5* 26.3* 24.6* 25.3*  MCV 93.3  --  93.0 89.8 90.8 91.0  PLT 202  --  142* 136* 130* 129*   < > = values in this interval not displayed.    Blood Culture    Component Value Date/Time   SDES BLOOD RIGHT ANTECUBITAL 07/21/2021 1915   SPECREQUEST  07/21/2021 1915    BOTTLES DRAWN AEROBIC ONLY Blood Culture results may not be optimal due to an inadequate volume of blood received in culture bottles   CULT  07/21/2021  1915    NO GROWTH 5 DAYS Performed at [facility], [place] [address], [place], [place] [address]    REPTSTATUS 07/26/2021 FINAL 07/21/2021 1915    Cardiac Enzymes: Recent Labs  Lab 07/22/21 0246  CKTOTAL 304*    CBG: Recent Labs  Lab 07/26/21 1540 07/26/21 2027 07/27/21 0000 07/27/21 0345 07/27/21 0829  GLUCAP 71 96 88 83 78    Iron Studies: No results for input(s): "IRON", "TIBC", "TRANSFERRIN", "FERRITIN" in the last 72 hours. @lablastinr3 @ Studies/Results: DG CHEST PORT 1 VIEW  Result Date: 07/25/2021 CLINICAL DATA:  Central line placement. EXAM: PORTABLE CHEST 1 VIEW COMPARISON:  07/24/2021. FINDINGS: Left IJ central line tip is likely up against the lateral wall of the SVC. Right IJ central line tip is in the SVC. Nasogastric tube is followed into the stomach with the tip projecting beyond the inferior margin of the image. Endotracheal to terminates approximately 4.6 cm above the carina. Heart size  stable. Bibasilar collapse/consolidation with a left pleural effusion. No pneumothorax. Biapical pleural thickening. IMPRESSION: 1. Left IJ central line placement without complicating feature. 2. Bibasilar collapse/consolidation.  Pneumonia cannot be excluded. 3. Left pleural effusion. Electronically Signed   By: Lorin Picket M.D.   On: 07/25/2021 15:13   Medications:  sodium chloride 10 mL/hr at 07/27/21 0654   piperacillin-tazobactam (ZOSYN)  IV Stopped (07/27/21 0203)    Chlorhexidine Gluconate Cloth  6 each Topical Daily   Chlorhexidine Gluconate Cloth  6 each Topical Q0600   heparin injection (subcutaneous)  5,000 Units Subcutaneous Q8H   neomycin-bacitracin-polymyxin   Topical TID   pantoprazole (PROTONIX) IV  40 mg Intravenous Q24H   sodium chloride flush  10-40 mL Intracatheter Q12H   sodium chloride flush  3 mL Intravenous Q12H    Assessment/Plan: 1.  Acute kidney injury chronic kidney disease stage IIIa: Appears consistent with ATN of septic shock.   Initiated HD 7/10 for worsening azotemia/metabolic acidosis. Hopefully will be temporary HD until she has renal recovery.  She remains ~anuric.  HD today and daily assessment for need.   Avoid nephrotoxic medications including NSAIDs and iodinated intravenous contrast exposure unless the latter is absolutely indicated.  Preferred narcotic agents for pain control are hydromorphone, fentanyl, and methadone. Morphine should not be used. Avoid Baclofen and avoid oral sodium phosphate and magnesium citrate based laxatives / bowel preps. Continue strict Input and Output monitoring. Will monitor the patient closely with you and intervene or adjust therapy as indicated by changes in clinical status/labs.  2.  Anion gap metabolic acidosis: Secondary to acute kidney injury, resolved. 3.  Incarcerated ventral hernia: Status post exploratory laparotomy with small bowel resection, adhesiolysis and hernia repair with JP drain placement.  Ongoing follow-up by surgery. 4.  Peritonitis: With resolution of septic shock, weaned off of pressors and remains on corticosteroids.  On intravenous Zosyn. 5.  Anemia: Possibly chronic from malabsorption given prior history of small bowel resection.  Exacerbated by acute/critical illness and perioperative blood losses.  Hb in the 8-9s currently.  6. Hyperphosphatemia: phos 9.2, follow - secondary to AKI: start binder when eating if remains issue.  Will follow, page with concerns.  Jannifer Hick MD 07/27/2021, 11:41 AM  [facility] Pager: [phone]

## 2021-07-27 NOTE — Progress Notes (Addendum)
Patient ID: [name], female   DOB: 1955-02-19, 66 y.o.   MRN: [id number] [facility] Surgery Progress Note  5 Days Post-Op  Subjective: Extubated.  Passing a lot of flatus and had a BM yesterday per her sister who is at bedside.  States she is hungry.  NGT with 600cc document yesterday.  Objective: Vital signs in last 24 hours: Temp:  [97.3 F (36.3 C)-98.2 F (36.8 C)] 98.2 F (36.8 C) (07/12 0830) Pulse Rate:  [74-107] 77 (07/12 0800) Resp:  [7-22] 16 (07/12 0800) BP: (115-168)/(52-94) 134/66 (07/12 0800) SpO2:  [98 %-100 %] 100 % (07/12 0800) Arterial Line BP: (144-226)/(55-84) 194/64 (07/11 1201) Last BM Date : 07/26/21  Intake/Output from previous day: 07/11 0701 - 07/12 0700 In: 230 [I.V.:130; IV Piggyback:100] Out: 668 [Urine:30; Emesis/NG output:600; Drains:38] Intake/Output this shift: No intake/output data recorded.  PE: Abd: soft, mild diffuse tenderness without rebound or guarding, no peritonitis, JP serosanguinous. Honeycomb dressing stable. ecchymosis noted on proximal thighs and pannus and inframammary fold on right side, but stable.  NGT with bilious output, 600cc total yesterday.  Few BS  Lab Results:  Recent Labs    07/26/21 0556 07/27/21 0332  WBC 11.2* 10.4  HGB 8.6* 8.7*  HCT 24.6* 25.3*  PLT 130* 129*   BMET Recent Labs    07/26/21 0556 07/27/21 0332  NA 141 140  K 4.1 4.7  CL 102 103  CO2 20* 20*  GLUCOSE 90 82  BUN 78* 94*  CREATININE 6.77* 8.15*  CALCIUM 7.4* 7.8*   PT/INR No results for input(s): "LABPROT", "INR" in the last 72 hours.  CMP     Component Value Date/Time   NA 140 07/27/2021 0332   NA 146 (H) 07/11/2017 1547   K 4.7 07/27/2021 0332   CL 103 07/27/2021 0332   CO2 20 (L) 07/27/2021 0332   GLUCOSE 82 07/27/2021 0332   BUN 94 (H) 07/27/2021 0332   BUN 14 07/11/2017 1547   CREATININE 8.15 (H) 07/27/2021 0332   CREATININE 1.04 05/06/2014 1538   CALCIUM 7.8 (L) 07/27/2021 0332   PROT 5.0 (L) 07/23/2021  0319   PROT 6.1 07/11/2017 1547   ALBUMIN 1.8 (L) 07/27/2021 0332   ALBUMIN 3.5 (L) 07/11/2017 1547   AST 36 07/23/2021 0319   ALT 34 07/23/2021 0319   ALKPHOS 36 (L) 07/23/2021 0319   BILITOT 1.0 07/23/2021 0319   BILITOT <0.2 07/11/2017 1547   GFRNONAA 5 (L) 07/27/2021 0332   GFRAA 50 (L) 09/13/2019 0755   Lipase     Component Value Date/Time   LIPASE 24 07/21/2021 2056       Studies/Results: DG CHEST PORT 1 VIEW  Result Date: 07/25/2021 CLINICAL DATA:  Central line placement. EXAM: PORTABLE CHEST 1 VIEW COMPARISON:  07/24/2021. FINDINGS: Left IJ central line tip is likely up against the lateral wall of the SVC. Right IJ central line tip is in the SVC. Nasogastric tube is followed into the stomach with the tip projecting beyond the inferior margin of the image. Endotracheal to terminates approximately 4.6 cm above the carina. Heart size stable. Bibasilar collapse/consolidation with a left pleural effusion. No pneumothorax. Biapical pleural thickening. IMPRESSION: 1. Left IJ central line placement without complicating feature. 2. Bibasilar collapse/consolidation.  Pneumonia cannot be excluded. 3. Left pleural effusion. Electronically Signed   By: Lorin Picket M.D.   On: 07/25/2021 15:13    Anti-infectives: Anti-infectives (From admission, onward)    Start     Dose/Rate Route Frequency Ordered Stop  07/23/21 1730  piperacillin-tazobactam (ZOSYN) IVPB 2.25 g        2.25 g 100 mL/hr over 30 Minutes Intravenous Every 8 hours 07/23/21 1207 07/28/21 1729   07/22/21 0900  piperacillin-tazobactam (ZOSYN) IVPB 2.25 g  Status:  Discontinued        2.25 g 100 mL/hr over 30 Minutes Intravenous Every 8 hours 07/22/21 0833 07/23/21 1207   07/22/21 0830  piperacillin-tazobactam (ZOSYN) IVPB 3.375 g  Status:  Discontinued        3.375 g 100 mL/hr over 30 Minutes Intravenous Every 8 hours 07/22/21 0827 07/22/21 0831   07/22/21 0000  piperacillin-tazobactam (ZOSYN) IVPB 3.375 g         3.375 g 100 mL/hr over 30 Minutes Intravenous  Once 07/21/21 2352 07/22/21 0142        Assessment/Plan POD#5 s/p  exploratory laparotomy, small bowel resection, adhesiolysis x90 minutes, primary repair of incarcerated ventral hernia, JP drain placement 7/7 Dr. Bobbye Morton for incarcerated and strangulated ventral hernia - monitor JP, serosanguinous - Continue NGT, but clamp today and see how she does given she is having some bowel function, but don't remove yet so in case she develops N/V can return to [facility] given she still had 600cc out yesterday. - needs 5 days zosyn postop, can stop after today - WBC normalized today -discussed with primary service.  Hold on TNA as if we are able to DC NGT tomorrow and start diet, no need to start TNA for just a day.  If she doesn't tolerate clamping trial, will initiate tomorrow   ID - zosyn 7/7>>7/12 FEN - IVF, NPO/NGT clamped VTE - sq heparin Foley - continue   Septic shock - off pressors 7/9 ARF - HD started 7/10 VDRF - extubated 7/11 Elevated troponin - cards has seen, suspect secondary to critical illness and not ACS HTN Anemia - hgb stable     LOS: 5 days    Henreitta Cea, [facility] Surgery 07/27/2021, 9:24 AM Please see Amion for pager number during day hours 7:00am-4:30pm

## 2021-07-27 NOTE — Evaluation (Signed)
Physical Therapy Evaluation Patient Details Name: [name] MRN: [id number] DOB: [birth year] Today's Date: 07/27/2021  History of Present Illness  [name] is a 66 y/o female presenting 7/6 with complaints of worsening abdominal pain.  7/7, [name] s/p exp lap, small bowel resection, adhesiolysis x 90 min, primary repair of incarcerated ventral hernia and JP drain placement.  Extubated 7/11.  PMHx:  HTN, strangulated recurrent ventral hernica, s/p small bowel, hernia repairs.  Clinical Impression  [name] admitted with/for complications of incarcerated ventral hernia/repair.  Evaluation was limited today to sitting EOB due to weakness, lower arousal and no assist.  [name] needed maximal assist to transition to/from EOB .  [name] currently limited functionally due to the problems listed. ( See problems list.)   [name] will benefit from [name] to maximize function and safety in order to get ready for next venue listed below.        Recommendations for follow up therapy are one component of a multi-disciplinary discharge planning process, led by the attending physician.  Recommendations may be updated based on patient status, additional functional criteria and insurance authorization.  Follow Up Recommendations Skilled nursing-short term rehab (<3 hours/day) Can patient physically be transported by private vehicle: No (TBD again after treatment)    Assistance Recommended at Discharge Frequent or constant Supervision/Assistance  Patient can return home with the following  A little help with walking and/or transfers;A little help with bathing/dressing/bathroom;Assistance with cooking/housework;Assist for transportation;Help with stairs or ramp for entrance    Equipment Recommendations Other (comment) (TBD)  Recommendations for Other Services       Functional Status Assessment Patient has had a recent decline in their functional status and demonstrates the ability to make significant improvements in function in a  reasonable and predictable amount of time.     Precautions / Restrictions Precautions Precautions: Fall      Mobility  Bed Mobility Overal bed mobility: Needs Assistance Bed Mobility: Supine to Sit, Sit to Supine     Supine to sit: Max assist Sit to supine: Max assist, Mod assist, +2 for physical assistance   General bed mobility comments: cues for sequencing, assist for lateral scooting/ LE movement to EOB, truncal assist up/forward.    Transfers                   General transfer comment: deferred due to weakness/ no +2 assist    Ambulation/Gait                  Stairs            Wheelchair Mobility    Modified Rankin (Stroke Patients Only)       Balance Overall balance assessment: Needs assistance Sitting-balance support: No upper extremity supported Sitting balance-Leahy Scale: Poor Sitting balance - Comments: reliant on UE assist or external support Postural control: Posterior lean                                   Pertinent Vitals/Pain Pain Assessment Pain Assessment: Faces Faces Pain Scale: Hurts a little bit Pain Location: bil LE, not abdomen Pain Descriptors / Indicators: Discomfort, Grimacing Pain Intervention(s): Monitored during session    Home Living Family/patient expects to be discharged to:: Private residence Living Arrangements: Alone Available Help at Discharge: Family;Available PRN/intermittently Type of Home: House Home Access: Stairs to enter Entrance Stairs-Rails: Right;Left Entrance Stairs-Number of Steps: several   Home Layout: One level Home Equipment:  None      Prior Function Prior Level of Function : Independent/Modified Independent;Driving             Mobility Comments: no AD needed       Hand Dominance        Extremity/Trunk Assessment   Upper Extremity Assessment Upper Extremity Assessment: Generalized weakness    Lower Extremity Assessment Lower Extremity  Assessment: Generalized weakness    Cervical / Trunk Assessment Cervical / Trunk Assessment: Kyphotic  Communication   Communication: No difficulties  Cognition Arousal/Alertness: Awake/alert, Lethargic Behavior During Therapy: Flat affect Overall Cognitive Status: Difficult to assess                                          General Comments General comments (skin integrity, edema, etc.): HR rose to 140 in sitting EOB corresponding to worsening balance, so returned to supine.    Exercises Other Exercises Other Exercises: Warm up ROM for Knee, elbow/shd stiffness.   Assessment/Plan    [name] Assessment Patient needs continued [name] services  [name] Problem List Decreased strength;Decreased activity tolerance;Decreased mobility;Decreased balance;Decreased coordination;Decreased knowledge of use of DME;Pain       [name] Treatment Interventions DME instruction;Gait training;Functional mobility training;Therapeutic activities;Balance training;Patient/family education    [name] Goals (Current goals can be found in the Care Plan section)  Acute Rehab [name] Goals Patient Stated Goal: Wants to be at home alone, Independent [name] Goal Formulation: With patient Time For Goal Achievement: 08/10/21 Potential to Achieve Goals: Good    Frequency Min 3X/week     Co-evaluation               AM-PAC [name] "6 Clicks" Mobility  Outcome Measure Help needed turning from your back to your side while in a flat bed without using bedrails?: A Lot Help needed moving from lying on your back to sitting on the side of a flat bed without using bedrails?: A Lot Help needed moving to and from a bed to a chair (including a wheelchair)?: A Lot Help needed standing up from a chair using your arms (e.g., wheelchair or bedside chair)?: A Lot Help needed to walk in hospital room?: Total Help needed climbing 3-5 steps with a railing? : Total 6 Click Score: 10    End of Session   Activity Tolerance: Patient  tolerated treatment well;Patient limited by pain Patient left: in bed;with call bell/phone within reach;with nursing/sitter in room Nurse Communication: Mobility status [name] Visit Diagnosis: Other abnormalities of gait and mobility (R26.89);Muscle weakness (generalized) (M62.81);Difficulty in walking, not elsewhere classified (R26.2);Pain Pain - Right/Left:  (bil) Pain - part of body: Leg (abdomen back to bed)    Time: 4496-7591 [name] Time Calculation (min) (ACUTE ONLY): 30 min   Charges:   [name] Evaluation $[name] Eval Moderate Complexity: 1 Mod [name] Treatments $Therapeutic Activity: 8-22 mins        07/27/2021  Ginger Carne., [name] Acute Rehabilitation Services [phone]  (pager) [phone]  (office)  Tessie Fass Enslie Sahota 07/27/2021, 7:19 PM

## 2021-07-27 NOTE — Progress Notes (Signed)
[name], MRN:  [id number], DOB:  1955/09/23, LOS: 5 ADMISSION DATE:  07/21/2021, CONSULTATION DATE:  07/22/21 REFERRING MD:  EDP, CHIEF COMPLAINT:  Abdominal Pain   History of Present Illness:  [name] is a 66 y.o. 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  She presented to the ED with severe abdominal pain and vomiting on 7/7.  Labs were significant for Creatinine of 5.28, lactic acid 6.5, WBC 11.6 and CT abd/pelvis with SBO, large lower abdominal ventral wall hernia containing most of the small bowel.    Surgery was consulted and she was taken to the OR for emergent exlap and hernia repair.  Post-op pt was transferred to the ICU and PCCM consulted.   Pertinent  Medical History  Arthritis, HLD, HTN, Hx of ventral hernia  Significant Hospital Events: Including procedures, antibiotic start and stop dates in addition to other pertinent events   7/07-presented to the ED with abdominal pain and SBO, to OR for ex-lap, txfr to ICU  7/08 elevated troponin >> cardiology consulted 7/09 off pressors; renal consulted 07/10 On pressure support this am; HD catheter placed 07/11 extubated Interim History / Subjective:  Doing well. Denying any acute concerns.   Objective: MAP>65. Normotensive. Afebrile.   Blood pressure 134/66, pulse 77, temperature 98.2 F (36.8 C), temperature source Oral, resp. rate 16, height 5\' 1"  (1.549 m), weight 96.2 kg, SpO2 100 %.       Intake/Output Summary (Last 24 hours) at 07/27/2021 0937 Last data filed at 07/27/2021 0654 Gross per 24 hour  Intake 210.03 ml  Output 668 ml  Net -457.97 ml   Filed Weights   07/25/21 0530 07/25/21 1710 07/25/21 2033  Weight: 89.5 kg 95.7 kg 96.2 kg   Intake: 290 cc Urine 30 cc Emesis/NG output 600 cc dark output Drains 38  Total output 668 Net -378 cc 1  BM yesterday  Examination:  General: NAD, laying in bed,  Eyes: PERRL Cardiac: NSR, normal rate, systolic murmur 3/6 pronounced in aortic region.  Resp: CTAB Abdomen: No TTP, diminished bowel sounds, Drain placed with blood tinged output  Extremities: Symmetric, 1+ LE edema Skin: ecchymosis is resolving Neuro: follows all commands, moving all extremities.   Resolved Hospital Problem list   Septic shock  Assessment & Plan:  #Incarcerated and strangulated ventral hernia s/p SB resection, lysis of adhesions, repair of hernia, and JP placement. #Peritonitis S/p zosyn completed 07/11. Leukocytosis resolved. Surgery following; post op care, nutrition per surgery, Plan is NG tube clamp with plan to remove tomorrow. If she fails clamp, then we will start TPN tomorrow.  -CTM   #AKI from ATN 2nd to shock. #Gap metabolic acidosis. Baseline creatinine 0.88 from 03/25/21.  Nephrology consulted and following. Got HD once 07/10 with plan for iHD as needed. Cr climbing and at 8.15 this am but electrolytes mostly normal. Phosphorus up at 9.2 this.  -Nephrology following: plan for iHD.  -Continue to monitor electrolytes  #Anemia of critical illness and chronic disease. Hgb 8.7. Stable. -Will CTM on repeat CBC.  - transfuse for Hb < 7  #Hx of HTN Patient normotensive. Home amlodipine held.  -CTM  #Class II Obesity -Counsel on lifestyle modifications when patient is stable.   Best Practice (right click and "Reselect all SmartList Selections" daily)  Diet/type: NPO DVT prophylaxis: prophylactic heparin  GI prophylaxis: PPI Lines: HD catheter  Foley:  No Continuous: None   Code Status:  full code Last date of multidisciplinary goals of care discussion [updated family at bedside on 07/12]  Labs:      Latest Ref Rng & Units 07/27/2021    3:32 AM 07/26/2021    5:56 AM 07/25/2021    3:43 AM  CMP  Glucose 70 - 99 mg/dL 82  90  97   BUN 8 - 23 mg/dL 94  78  108   Creatinine 0.44 - 1.00  mg/dL 8.15  6.77  8.49   Sodium 135 - 145 mmol/L 140  141  142   Potassium 3.5 - 5.1 mmol/L 4.7  4.1  4.7   Chloride 98 - 111 mmol/L 103  102  107   CO2 22 - 32 mmol/L 20  20  14    Calcium 8.9 - 10.3 mg/dL 7.8  7.4  6.8        Latest Ref Rng & Units 07/27/2021    3:32 AM 07/26/2021    5:56 AM 07/25/2021    3:43 AM  CBC  WBC 4.0 - 10.5 K/uL 10.4  11.2  13.5   Hemoglobin 12.0 - 15.0 g/dL 8.7  8.6  9.2   Hematocrit 36.0 - 46.0 % 25.3  24.6  26.3   Platelets 150 - 400 K/uL 129  130  136     ABG    Component Value Date/Time   PHART 7.279 (L) 07/24/2021 0507   PCO2ART 34.3 07/24/2021 0507   PO2ART 178 (H) 07/24/2021 0507   HCO3 16.1 (L) 07/24/2021 0507   TCO2 17 (L) 07/24/2021 0507   ACIDBASEDEF 10.0 (H) 07/24/2021 0507   O2SAT 99 07/24/2021 0507    CBG (last 3)  Recent Labs    07/27/21 0000 07/27/21 0345 07/27/21 0829  GLUCAP 88 83 78   Home Medications         Prior to Admission medications   Medication Sig Start Date End Date Taking? Authorizing Provider  amLODipine (NORVASC) 10 MG tablet TAKE 1 TABLET(10 MG) BY MOUTH DAILY 07/11/17   Yes Weber, Sarah L, PA-C  amoxicillin-clavulanate (AUGMENTIN) 875-125 MG tablet Take 1 tablet by mouth every 12 (twelve) hours. Patient not taking: Reported on 07/22/2021 09/13/19     Joy, Raquel Sarna C, PA-C  ibuprofen (ADVIL) 800 MG tablet TAKE 1 TABLET(800 MG) BY MOUTH EVERY 8 HOURS AS NEEDED Patient not taking: Reported on 07/22/2021 07/29/18     Aundra Dubin, PA-C  methocarbamol (ROBAXIN) 500 MG tablet Take 1 tablet (500 mg total) by mouth 2 (two) times daily as needed. Patient not taking: Reported on 07/22/2021 01/06/19     Aundra Dubin, PA-C  ondansetron (ZOFRAN ODT) 4 MG disintegrating tablet Take 1 tablet (4 mg total) by mouth every 8 (eight) hours as needed for nausea or vomiting. Patient not taking: Reported on 07/22/2021 09/13/19     Lorayne Bender, PA-C    Idamae Schuller, MD Tillie Rung. [facility] Internal Medicine Residency,  PGY-2

## 2021-07-27 NOTE — TOC Progression Note (Signed)
Transition of Care [facility]) - Progression Note    Patient Details  Name: [name] MRN: [id number] Date of Birth: 23-Sep-1955  Transition of Care [facility]) CM/SW [place], RN Phone Number:[phone]  07/27/2021, 3:13 PM  Clinical Narrative:     Transition of Care (TOC) Screening Note   Patient Details  Name: [name] Date of Birth: 03/10/1955   Transition of Care [facility]) CM/SW Contact:    Angelita Ingles, RN Phone Number: 07/27/2021, 3:13 PM    Transition of Care Department [facility]) has reviewed patient and no TOC needs have been identified at this time. We will continue to monitor patient advancement through interdisciplinary progression rounds.           Expected Discharge Plan and Services                                                 Social Determinants of Health (SDOH) Interventions    Readmission Risk Interventions     No data to display

## 2021-07-27 NOTE — Progress Notes (Signed)
Initial Nutrition Assessment  DOCUMENTATION CODES:   Severe malnutrition in context of chronic illness  INTERVENTION:   If unable to remove NG and advance diet recommend start TPN ASAP  If able to initiate diet would recommend consider cortrak tube for nutrition support as pt unlikely to meet needs.    NUTRITION DIAGNOSIS:   Severe Malnutrition related to chronic illness (chronic incarcerated hernia) as evidenced by severe muscle depletion, severe fat depletion.  GOAL:   Patient will meet greater than or equal to 90% of their needs  MONITOR:   Diet advancement, I & O's  REASON FOR ASSESSMENT:   Rounds    ASSESSMENT:   Pt with PMH of HTN, HLD and chronically incarcerated large abd wall hernia repaired multiple times, most recently SBO resection with mesh placement in 2012 with hernia recurrence. Pt admitted with abd pain and vomiting due to SBO with large lower abd ventral wall hernia containing most of her small bowel.    Pt now NPO x 5 days, NG tube clamped today Post HD weight today 89.2 kg  7/7 admitted s/p ex-lap, SBR, LOA x 90 minutes, primary repair of incarcerated ventral hernia with JP drain placement 7/10 started iHD for AKI plan for HD as needed  7/11 extubated   Spoke with pt and her sister who was at bedside. Pt will open eyes but did not attempt to respond to any questions.  Per sister pt lives alone and is able to walk without assistance. She shops and prepares her own meals. Per sister she can tell that pt has been losing weight. Pt has complained of stomach issues for about a year PTA.  Per sister pt only eats bites at meals she has had with her.    Medications reviewed and include: protonix  Labs reviewed: Cr: 8.15, PO4: 9.2 CBG's: 71-107  UOP: 30 ml  16 F NG: 600 ml  JP drain: 38 ml  I&O: +6.4 L UF: 1.5 L    NUTRITION - FOCUSED PHYSICAL EXAM:  Flowsheet Row Most Recent Value  Orbital Region Severe depletion  Upper Arm Region No  depletion  Thoracic and Lumbar Region Unable to assess  Buccal Region Severe depletion  Temple Region Severe depletion  Clavicle Bone Region Severe depletion  Clavicle and Acromion Bone Region Severe depletion  Scapular Bone Region Severe depletion  Dorsal Hand Moderate depletion  Patellar Region Mild depletion  [excess skin, unable to move leg off of bed due to pain]  Anterior Thigh Region Mild depletion  Posterior Calf Region Unable to assess  Edema (RD Assessment) Severe  Hair Reviewed  Eyes Reviewed  Mouth Reviewed  [only lower teeth, pt would not open her mouth very far]  Skin Reviewed  Nails Reviewed  [pale nail beds]       Diet Order:   Diet Order             Diet NPO time specified Except for: Ice Chips  Diet effective now                   EDUCATION NEEDS:   Not appropriate for education at this time  Skin:  Skin Assessment:  (skin tear: R chest)  Last BM:  7/12  Height:   Ht Readings from Last 1 Encounters:  07/22/21 5\' 1"  (1.549 m)    Weight:   Wt Readings from Last 1 Encounters:  07/27/21 89.2 kg    BMI:  Body mass index is 37.16 kg/m.  Estimated Nutritional Needs:  Kcal:  1600-1800  Protein:  80-100 grams  Fluid:  >1.6 L/day  Lockie Pares., RD, LDN, CNSC See AMiON for contact information

## 2021-07-28 DIAGNOSIS — K436 Other and unspecified ventral hernia with obstruction, without gangrene: Secondary | ICD-10-CM | POA: Diagnosis not present

## 2021-07-28 LAB — GLUCOSE, CAPILLARY
Glucose-Capillary: 104 mg/dL — ABNORMAL HIGH (ref 70–99)
Glucose-Capillary: 68 mg/dL — ABNORMAL LOW (ref 70–99)
Glucose-Capillary: 78 mg/dL (ref 70–99)
Glucose-Capillary: 80 mg/dL (ref 70–99)

## 2021-07-28 MED ORDER — ALTEPLASE 2 MG IJ SOLR
2.0000 mg | Freq: Once | INTRAMUSCULAR | Status: AC
  Administered 2021-07-28: 2 mg

## 2021-07-28 MED ORDER — ACETAMINOPHEN 325 MG PO TABS: 650.0000 mg | ORAL_TABLET | Freq: Four times a day (QID) | ORAL | Status: DC | PRN

## 2021-07-28 MED ORDER — TRAMADOL HCL 50 MG PO TABS
50.0000 mg | ORAL_TABLET | Freq: Four times a day (QID) | ORAL | Status: DC | PRN
  Administered 2021-07-28 – 2021-08-16 (×16): 50 mg via ORAL
  Filled 2021-07-28 (×16): qty 1

## 2021-07-28 NOTE — Progress Notes (Signed)
Physical Therapy Treatment Patient Details Name: [name] MRN: [id number] DOB: 1955-10-21 Today's Date: 07/28/2021   History of Present Illness Pt is a 66 y.o. female admitted 07/21/21 with worsening abdominal pain. S/p emergent ex lap, small bowel resection, adhesiolysis x90 min, incarcerated ventral hernia repair with JP drain placement on 7/7. ETT 7/7-7/11. Course complicated by AKI on CKD 3; HD initiated 7/10. NGT removed 7/13. PMH includes recurrent ventral hernia s/p hernia and small bowel repairs, HTN.   PT Comments    Pt slowly progressing with mobility, remains limited by increased fatigue/lethargy and pain. Today, pt requiring modA to maxA+2 to initiate standing and pre-gait activity, requiring cues to keep eyes open and attend to therapist. Also noted asterixis-like(?) BUE movements. Pt remains limited by generalized weakness, decreased activity tolerance, poor balance strategies/postural reactions and impaired cognition. Per SW note, pt declines recommendation for SNF-level therapies; will require increased family assist and HHPT services.     Recommendations for follow up therapy are one component of a multi-disciplinary discharge planning process, led by the attending physician.  Recommendations may be updated based on patient status, additional functional criteria and insurance authorization.  Follow Up Recommendations  Skilled nursing-short term rehab (<3 hours/day) - noted pt declined SNF, plans for home Can patient physically be transported by private vehicle:  (no currently, but TBD)   Assistance Recommended at Discharge Frequent or constant Supervision/Assistance  Patient can return home with the following A lot of help with walking and/or transfers;A lot of help with bathing/dressing/bathroom;Assistance with cooking/housework;Assist for transportation;Help with stairs or ramp for entrance   Equipment Recommendations  Other (comment) (TBD)    Recommendations for Other  Services  Occupational Therapy     Precautions / Restrictions Precautions Precautions: Fall;Other (comment) Precaution Comments: R abdominal JP drain; watch HR Restrictions Weight Bearing Restrictions: No     Mobility  Bed Mobility Overal bed mobility: Needs Assistance Bed Mobility: Rolling, Sidelying to Sit, Sit to Supine Rolling: Mod assist Sidelying to sit: Mod assist   Sit to supine: Max assist, +2 for physical assistance   General bed mobility comments: cues for log roll technique to minimize abdominal discomfort, increased time with repeated cues for sequencing; modA to roll and modA for trunk elevation to sidelying, pt required repeated cues but ultimately able to scoot hips to EOB. increasing fatigue and keeping eyes closed sitting EOB with asterixis(?)-like BUE movements/tremulous, maxA+2 for return to supine    Transfers Overall transfer level: Needs assistance Equipment used: Rolling walker (2 wheels) Transfers: Sit to/from Stand Sit to Stand: Mod assist, +2 safety/equipment           General transfer comment: cues for hand placement, modA for trunk elevation and stability standing to RW    Ambulation/Gait             Pre-gait activities: pt able to march in place with RW and minA for stability, minimal foot clearance suspect related to fatigue; asked pt to take side steps towards [facility], pt returning to sit unannounced     Stairs             Wheelchair Mobility    Modified Rankin (Stroke Patients Only)       Balance Overall balance assessment: Needs assistance Sitting-balance support: No upper extremity supported, Single extremity supported Sitting balance-Leahy Scale: Poor Sitting balance - Comments: reliant on UE assist or external support Postural control: Posterior lean Standing balance support: Reliant on assistive device for balance Standing balance-Leahy Scale: Poor  Cognition  Arousal/Alertness: Awake/alert, Lethargic Behavior During Therapy: Flat affect Overall Cognitive Status: Difficult to assess Area of Impairment: Attention, Following commands, Safety/judgement, Awareness, Problem solving                   Current Attention Level: Sustained   Following Commands: Follows one step commands with increased time Safety/Judgement: Decreased awareness of safety, Decreased awareness of deficits Awareness: Emergent Problem Solving: Slow processing, Decreased initiation, Requires verbal cues General Comments: pt becoming more fatigued/lethargic as session progressed requiring cues to speak louder and keep eyes open; increased time for command following; when asked for pt to described symptoms, pt reports, "I'll do better tomorrow." falling asleep upon return to supine        Exercises      General Comments General comments (skin integrity, edema, etc.): pt's sister present during session. pt's HR 91-131 with brief bouts of activity. pt quick to fatigue with poor tolerance to seated/standing activity requiring return to supine; further mobility limited by arrival of IV team      Pertinent Vitals/Pain Pain Assessment Pain Assessment: Faces Faces Pain Scale: Hurts little more Pain Location: abdomen, RLE, generalized Pain Descriptors / Indicators: Discomfort, Grimacing, Guarding Pain Intervention(s): Monitored during session, Limited activity within patient's tolerance    Home Living                          Prior Function            PT Goals (current goals can now be found in the care plan section) Acute Rehab PT Goals Patient Stated Goal: return home Progress towards PT goals: Progressing toward goals (slow)    Frequency    Min 3X/week      PT Plan Current plan remains appropriate    Co-evaluation              AM-PAC PT "6 Clicks" Mobility   Outcome Measure  Help needed turning from your back to your side while in a  flat bed without using bedrails?: A Lot Help needed moving from lying on your back to sitting on the side of a flat bed without using bedrails?: A Lot Help needed moving to and from a bed to a chair (including a wheelchair)?: A Lot Help needed standing up from a chair using your arms (e.g., wheelchair or bedside chair)?: A Lot Help needed to walk in hospital room?: Total Help needed climbing 3-5 steps with a railing? : Total 6 Click Score: 10    End of Session Equipment Utilized During Treatment: Gait belt Activity Tolerance: Patient limited by fatigue;Patient limited by lethargy Patient left: in bed;with call bell/phone within reach;with nursing/sitter in room;with family/visitor present;with bed alarm set Nurse Communication: Mobility status PT Visit Diagnosis: Other abnormalities of gait and mobility (R26.89);Muscle weakness (generalized) (M62.81);Difficulty in walking, not elsewhere classified (R26.2);Pain     Time: 1501-1520 PT Time Calculation (min) (ACUTE ONLY): 19 min  Charges:  $Therapeutic Activity: 8-22 mins                     Mabeline Caras, PT, DPT Acute Rehabilitation Services  Personal: [facility] Office: [place] 07/28/2021, 5:16 PM

## 2021-07-28 NOTE — TOC Initial Note (Signed)
Transition of Care [facility]) - Initial/Assessment Note    Patient Details  Name: [name] MRN: [id number] Date of Birth: September 27, 1955  [facility] CM/SW Contact:    Vinie Sill, LCSW Phone Number: 07/28/2021, 3:03 PM  Clinical Narrative:                  CW met with patient and her sister,Brenda at bedside. CSW introduced  self and explained role. CSW discussed PT/OT recommendation for  short term rehab at [facility].Patient declined SNF. Patient and patient's sister shared someone will be on the home to assist the patient has needed.   Family states preference is for [facility]-  RNCM updated.  TOC will continue to follow and assist with discharge planning.  Thurmond Butts, MSW, LCSW Clinical Social Worker    Expected Discharge Plan: [place] Barriers to Discharge: Continued Medical Work up   Patient Goals and CMS Choice        Expected Discharge Plan and Services Expected Discharge Plan: [place]       Living arrangements for the past 2 months: Single Family Home                                      Prior Living Arrangements/Services Living arrangements for the past 2 months: Single Family Home Lives with:: Self Patient language and need for interpreter reviewed:: No        Need for Family Participation in Patient Care: Yes (Comment) Care giver support system in place?: Yes (comment)   Criminal Activity/Legal Involvement Pertinent to Current Situation/Hospitalization: No - Comment as needed  Activities of Daily Living Home Assistive Devices/Equipment: None ADL Screening (condition at time of admission) Patient's cognitive ability adequate to safely complete daily activities?: Yes Is the patient deaf or have difficulty hearing?: No Does the patient have difficulty seeing, even when wearing glasses/contacts?: No Does the patient have difficulty concentrating, remembering, or making decisions?:  No Patient able to express need for assistance with ADLs?: Yes Does the patient have difficulty dressing or bathing?: Yes Independently performs ADLs?: No Communication: Independent Dressing (OT): Dependent Is this a change from baseline?: Change from baseline, expected to last >3 days Grooming: Dependent Is this a change from baseline?: Change from baseline, expected to last >3 days Feeding: Dependent Is this a change from baseline?: Change from baseline, expected to last >3 days Bathing: Dependent Is this a change from baseline?: Change from baseline, expected to last >3 days Toileting: Dependent Is this a change from baseline?: Change from baseline, expected to last >3days In/Out Bed: Needs assistance Is this a change from baseline?: Change from baseline, expected to last >3 days Walks in Home: Independent Does the patient have difficulty walking or climbing stairs?: No Weakness of Legs: None Weakness of Arms/Hands: None  Permission Sought/Granted                  Emotional Assessment   Attitude/Demeanor/Rapport: Engaged Affect (typically observed): Apprehensive, Pleasant Orientation: : Oriented to Self, Oriented to Place, Oriented to  Time, Oriented to Situation Alcohol / Substance Use: Not Applicable Psych Involvement: No (comment)  Admission diagnosis:  Dehydration [E86.0] Hypokalemia [E87.6] Lactic acidosis [E87.20] Bowel obstruction ([place]) [K56.609] Small bowel obstruction ([place]) [K56.609] AKI (acute kidney injury) ([place]) [N17.9] H/O exploratory laparotomy [Z98.890] Patient Active Problem List   Diagnosis Date Noted   Ventral hernia  with bowel obstruction 07/22/2021   History of small bowel obstruction 07/22/2021   SIRS (systemic inflammatory response syndrome) ([facility]) 07/22/2021   Lactic acidosis 07/22/2021   Transient hypotension 07/22/2021   Acute renal failure superimposed on stage 3b chronic kidney disease ([place]) 07/22/2021   Hypokalemia 07/22/2021    Leukocytosis 07/22/2021   H/O exploratory laparotomy 07/22/2021   Septic shock ([place])    Ventilator dependence ([place])    Body mass index 40.0-44.9, adult ([place]) 07/03/2017   Nontraumatic complete tear of left rotator cuff 07/03/2017   Chronic left shoulder pain 06/05/2017   Essential hypertension 05/06/2014   Hyperlipidemia 05/06/2014   Strangulated recurrent ventral incisional hernia, s/p small intestine resection 09/22/2010   Edema of lower extremities 09/22/2010   PCP:  Mancel Bale, PA-C Pharmacy:   [facility] [address], [place] [address] Phone: [phone] Fax: [place], [place] [address] [place] [facility] [place] [address] Phone: [phone] Fax: [phone]     Social Determinants of Health (SDOH) Interventions    Readmission Risk Interventions     No data to display

## 2021-07-28 NOTE — Progress Notes (Signed)
   07/27/21 2136  Vitals  Temp 98 F (36.7 C)  Temp Source Oral  BP 138/68  MAP (mmHg) 87  BP Location Left Arm  BP Method Automatic  Patient Position (if appropriate) Lying  Pulse Rate 89  Pulse Rate Source Monitor  ECG Heart Rate 90  Resp 18  Level of Consciousness  Level of Consciousness Alert  MEWS COLOR  MEWS Score Color Green  Oxygen Therapy  SpO2 100 %  O2 Device Room Air  MEWS Score  MEWS Temp 0  MEWS Systolic 0  MEWS Pulse 0  MEWS RR 0  MEWS LOC 0  MEWS Score 0   Pt transported to 4E12. Pt made comfortable. CHG bath given. CCMD called. Call light by pt. Pt denies pain. RN will continue to monitor pt.

## 2021-07-28 NOTE — Consult Note (Addendum)
[place] Nurse Consult Note: Reason for Consult:Intertriginous dermatitis to abdominal pannus.  Moisture associated skin damage to  right buttocks.   Recent surgical repair of incarcerated hernia.  Severe malnutrition, acute renal failure and acute respiratory failure postoperatively. Wound type:moisture Pressure Injury POA: NA Measurement: Right buttock 1 cm x 0.5 cm x 0.1 cm peeling epithelium Right abdominal pannus:  discoloration 3 cm x 1.5 cm intact with central area peeling epithelium, measuring 0.3 cm  Left abdominal pannus with 3 round areas where epithelium has peeled, measuring 2 cm x 1 cm x 0.1 cm  Abdominal pannus is moist.  Skin is friable and peels with rub/shearing forces.   Wound bed: pale pink, moist Drainage (amount, consistency, odor) minimal serosanguinous  no odor Periwound: moist, frequently exposed to moisture in skin folds Have ordered mattress with low air loss feature. Nutritional consult completed.   Dressing procedure/placement/frequency: Cleanse abdominal pannus with soap and water and pat gently dry.  Apply Interdry to skin fold:  Measure and cut length of InterDry to fit in skin folds that have skin breakdown Tuck InterDry fabric into skin folds in a single layer, allow for 2 inches of overhang from skin edges to allow for wicking to occur May remove to bathe; dry area thoroughly and then tuck into affected areas again Do not apply any creams or ointments when using InterDry DO NOT THROW AWAY FOR 5 DAYS unless soiled with stool DO NOT [facility] product, this will inactivate the silver in the material  New sheet of Interdry should be applied after 5 days of use if patient continues to have skin breakdown   For nonintact areas on abdomen, apply Xeroform gauze and cover with silicone foam .   Will not follow at this time.  Please re-consult if needed.  Domenic Moras MSN, RN, FNP-BC CWON Wound, Ostomy, Continence Nurse Pager [phone]

## 2021-07-28 NOTE — Progress Notes (Signed)
Patient ID: [name], female   DOB: 10-06-1955, 66 y.o.   MRN: [id number] [facility] Surgery Progress Note  6 Days Post-Op  Subjective: NGT was not removed yesterday but remained clamped overnight. Patient denies aspiration or emesis. She reports abdomen is sore but no worsening pain. She is passing flatus and has had some bowel function. She is tolerating ice chips.   Objective: Vital signs in last 24 hours: Temp:  [97.5 F (36.4 C)-98.9 F (37.2 C)] 97.8 F (36.6 C) (07/13 0829) Pulse Rate:  [80-140] 89 (07/13 0829) Resp:  [18-24] 20 (07/13 0829) BP: (90-151)/(58-89) 142/73 (07/13 0829) SpO2:  [95 %-100 %] 100 % (07/13 0829) Weight:  [89.2 kg-90.7 kg] 89.2 kg (07/12 1304) Last BM Date : 07/26/21  Intake/Output from previous day: 07/12 0701 - 07/13 0700 In: 0  Out: 180 [Emesis/NG output:155; Drains:25] Intake/Output this shift: No intake/output data recorded.  PE: Abd: soft, mild diffuse tenderness without rebound or guarding, no peritonitis, JP serosanguinous. Honeycomb dressing stable. NGT clamped. Normoactive BS  Lab Results:  Recent Labs    07/26/21 0556 07/27/21 0332  WBC 11.2* 10.4  HGB 8.6* 8.7*  HCT 24.6* 25.3*  PLT 130* 129*    BMET Recent Labs    07/26/21 0556 07/27/21 0332  NA 141 140  K 4.1 4.7  CL 102 103  CO2 20* 20*  GLUCOSE 90 82  BUN 78* 94*  CREATININE 6.77* 8.15*  CALCIUM 7.4* 7.8*    PT/INR No results for input(s): "LABPROT", "INR" in the last 72 hours.  CMP     Component Value Date/Time   NA 140 07/27/2021 0332   NA 146 (H) 07/11/2017 1547   K 4.7 07/27/2021 0332   CL 103 07/27/2021 0332   CO2 20 (L) 07/27/2021 0332   GLUCOSE 82 07/27/2021 0332   BUN 94 (H) 07/27/2021 0332   BUN 14 07/11/2017 1547   CREATININE 8.15 (H) 07/27/2021 0332   CREATININE 1.04 05/06/2014 1538   CALCIUM 7.8 (L) 07/27/2021 0332   PROT 5.0 (L) 07/23/2021 0319   PROT 6.1 07/11/2017 1547   ALBUMIN 1.8 (L) 07/27/2021 0332   ALBUMIN 3.5  (L) 07/11/2017 1547   AST 36 07/23/2021 0319   ALT 34 07/23/2021 0319   ALKPHOS 36 (L) 07/23/2021 0319   BILITOT 1.0 07/23/2021 0319   BILITOT <0.2 07/11/2017 1547   GFRNONAA 5 (L) 07/27/2021 0332   GFRAA 50 (L) 09/13/2019 0755   Lipase     Component Value Date/Time   LIPASE 24 07/21/2021 2056       Studies/Results: No results found.  Anti-infectives: Anti-infectives (From admission, onward)    Start     Dose/Rate Route Frequency Ordered Stop   07/23/21 1730  piperacillin-tazobactam (ZOSYN) IVPB 2.25 g        2.25 g 100 mL/hr over 30 Minutes Intravenous Every 8 hours 07/23/21 1207 07/28/21 1019   07/22/21 0900  piperacillin-tazobactam (ZOSYN) IVPB 2.25 g  Status:  Discontinued        2.25 g 100 mL/hr over 30 Minutes Intravenous Every 8 hours 07/22/21 0833 07/23/21 1207   07/22/21 0830  piperacillin-tazobactam (ZOSYN) IVPB 3.375 g  Status:  Discontinued        3.375 g 100 mL/hr over 30 Minutes Intravenous Every 8 hours 07/22/21 0827 07/22/21 0831   07/22/21 0000  piperacillin-tazobactam (ZOSYN) IVPB 3.375 g        3.375 g 100 mL/hr over 30 Minutes Intravenous  Once 07/21/21 2352 07/22/21 0142  Assessment/Plan POD#6 s/p  exploratory laparotomy, small bowel resection, adhesiolysis x90 minutes, primary repair of incarcerated ventral hernia, JP drain placement 7/7 Dr. Bobbye Morton for incarcerated and strangulated ventral hernia - monitor JP, serosanguinous - removed NGT, start CLD today  - ok to add protein supplement if ok with renal  - completed 5 days post-op abx, WBC normalized yesterday  - encourage mobilization   ID - zosyn 7/7>7/12 FEN - CLD VTE - sq heparin Foley - continue   Septic shock - off pressors 7/9 ARF - HD started 7/10 VDRF - extubated 7/11 Elevated troponin - cards has seen, suspect secondary to critical illness and not ACS HTN Anemia - hgb stable     LOS: 6 days    Norm Parcel, [facility] Surgery 07/28/2021, 10:26  AM Please see Amion for pager number during day hours 7:00am-4:30pm

## 2021-07-28 NOTE — Progress Notes (Signed)
PROGRESS NOTE  [name]  POE:[id number] DOB: Jun 09, 1955 DOA: 07/21/2021 PCP: Mancel Bale, PA-C   Brief Narrative:  Patient is a 66 year old female with history of hypertension, CKD stage III a,hyperlipidemia, chronically incarcerated large abdominal wall hernia which was repaired multiple times, recent SBO resection with mesh placement who presented to the ER with severe abdominal pain, vomiting.  Lab work showed creatinine of 5.2, lactic acid of 6.5, leukocytosis.  CT abdomen/pelvis showed large SBO, large lower abdominal ventral hernia containing much of the small bowel.  General surgery consulted and she was taken to the OR for emergent exploratory laparotomy and hernia repair,she was transferred to ICU.  Currently hemodynamically stable and patient transferred to [facility] service on 7/13.  On dialysis for persistent AKI, nephrology following.  General surgery following.  PT recommending skilled nursing facility on discharge   Assessment & Plan:  Principal Problem:   Ventral hernia with bowel obstruction Active Problems:   SIRS (systemic inflammatory response syndrome) (HCC)   Lactic acidosis   Leukocytosis   Acute renal failure superimposed on stage 3b chronic kidney disease (HCC)   Transient hypotension   Hypokalemia   History of small bowel obstruction   Body mass index 40.0-44.9, adult ([place])   H/O exploratory laparotomy   Septic shock (HCC)   Ventilator dependence ([place])   Incarcerated/strangulated ventral hernia: Status post small bowel resection, lysis of additions, repair of hernia and JP drain placement.  Also found to have peritonitis.  Completed antibiotics course.  Leukocytosis resolved.  General surgery following.  NG tube removed, started on clear liquid diet.  AKI on CKD stage 3A: Likely from ATN from septic shock secondary to peritonitis.  Initiated hemodialysis on 7/10, nephrology following.  Hopeful for recovery.  Avoid nephrotoxins.  Normocytic anemia: Likely  associated with critical illness, chronic kidney disease.  Hemoglobin stable in the range of 8.  Continue to monitor  History of hypertension: Currently normotensive.  On amlodipine at home, currently on hold  Acute hypoxic respiratory failure: Had to be put on ventilator after surgery, currently extubated and on room air  Obesity: BMI of 37.1  Debility/deconditioning: Patient seen by PT and recommended skilled nursing facility on discharge   Nutrition Problem: Severe Malnutrition Etiology: chronic illness (chronic incarcerated hernia)    DVT prophylaxis:heparin injection 5,000 Units Start: 07/22/21 2200     Code Status: Full Code  Family Communication: Sister at bedside  Patient status:Inpatient  Patient is from :Home  Anticipated discharge to:SNF  Estimated DC date:Not sure, as per surgery   Consultants: General surgery  Procedures: Intubation, exploratory laparotomy  Antimicrobials:  Anti-infectives (From admission, onward)    Start     Dose/Rate Route Frequency Ordered Stop   07/23/21 1730  piperacillin-tazobactam (ZOSYN) IVPB 2.25 g        2.25 g 100 mL/hr over 30 Minutes Intravenous Every 8 hours 07/23/21 1207 07/27/21 2308   07/22/21 0900  piperacillin-tazobactam (ZOSYN) IVPB 2.25 g  Status:  Discontinued        2.25 g 100 mL/hr over 30 Minutes Intravenous Every 8 hours 07/22/21 0833 07/23/21 1207   07/22/21 0830  piperacillin-tazobactam (ZOSYN) IVPB 3.375 g  Status:  Discontinued        3.375 g 100 mL/hr over 30 Minutes Intravenous Every 8 hours 07/22/21 0827 07/22/21 0831   07/22/21 0000  piperacillin-tazobactam (ZOSYN) IVPB 3.375 g        3.375 g 100 mL/hr over 30 Minutes Intravenous  Once 07/21/21 2352 07/22/21 0142  Subjective: Patient seen and examined at the bedside this morning.  Hemodynamically stable, lying in bed, looks very weak and deconditioned.  NG tube removed.  Denies any abdomen pain, nausea or vomiting.  Passing  flatus.  Objective: Vitals:   07/27/21 2106 07/27/21 2136 07/28/21 0009 07/28/21 0458  BP: 139/89 138/68 139/89 (!) 141/67  Pulse: 91 89 91 86  Resp: 20 18 20 18   Temp: 97.9 F (36.6 C) 98 F (36.7 C) 97.9 F (36.6 C) (!) 97.5 F (36.4 C)  TempSrc:  Oral Oral Oral  SpO2:  100% 100% 100%  Weight:      Height:        Intake/Output Summary (Last 24 hours) at 07/28/2021 0751 Last data filed at 07/28/2021 0458 Gross per 24 hour  Intake 0 ml  Output 180 ml  Net -180 ml   Filed Weights   07/25/21 2033 07/27/21 1028 07/27/21 1304  Weight: 96.2 kg 90.7 kg 89.2 kg    Examination:  General exam: Overall comfortable, not in distress, deconditioned, weak HEENT: PERRL Respiratory system:  no wheezes or crackles  Cardiovascular system: S1 & S2 heard, RRR.  Gastrointestinal system: Abdomen is nondistended, soft and nontender.  Midline surgical wound, JP drain on left lower quadrant, bowel sounds heard Central nervous system: Alert and oriented Extremities: No edema, no clubbing ,no cyanosis Skin: No rashes, no ulcers,no icterus     Data Reviewed: I have personally reviewed following labs and imaging studies  CBC: Recent Labs  Lab 07/21/21 2056 07/22/21 1620 07/23/21 0919 07/24/21 0507 07/24/21 0524 07/25/21 0343 07/26/21 0556 07/27/21 0332  WBC 11.6*   < > 11.5*  --  10.9* 13.5* 11.2* 10.4  NEUTROABS 9.0*  --  9.0*  --   --   --  8.9* 7.9*  HGB 13.2   < > 10.0* 8.8* 8.8* 9.2* 8.6* 8.7*  HCT 40.8   < > 29.4* 26.0* 26.5* 26.3* 24.6* 25.3*  MCV 96.7   < > 93.3  --  93.0 89.8 90.8 91.0  PLT 272   < > 202  --  142* 136* 130* 129*   < > = values in this interval not displayed.   Basic Metabolic Panel: Recent Labs  Lab 07/21/21 2056 07/22/21 0246 07/23/21 0319 07/24/21 0507 07/24/21 0524 07/25/21 0343 07/26/21 0556 07/27/21 0332  NA 143   < > 144 141 141 142 141 140  K 2.9*   < > 4.0 4.1 4.1 4.7 4.1 4.7  CL 94*   < > 105  --  105 107 102 103  CO2 27   < > 18*  --   15* 14* 20* 20*  GLUCOSE 102*   < > 102*  --  137* 97 90 82  BUN 74*   < > 86*  --  96* 108* 78* 94*  CREATININE 5.28*   < > 6.42*  --  7.68* 8.49* 6.77* 8.15*  CALCIUM 8.7*   < > 6.8*  --  6.2* 6.8* 7.4* 7.8*  MG 2.1  --   --   --   --   --  1.9  --   PHOS  --   --   --   --   --   --  6.7* 9.2*   < > = values in this interval not displayed.     Recent Results (from the past 240 hour(s))  Culture, blood (routine x 2)     Status: None   Collection Time: 07/21/21  7:15 PM   Specimen: BLOOD  Result Value Ref Range Status   Specimen Description BLOOD RIGHT ANTECUBITAL  Final   Special Requests   Final    BOTTLES DRAWN AEROBIC ONLY Blood Culture results may not be optimal due to an inadequate volume of blood received in culture bottles   Culture   Final    NO GROWTH 5 DAYS Performed at [facility], [place] [address], [place], [place] [address]    Report Status 07/26/2021 FINAL  Final  MRSA Next Gen by PCR, Nasal     Status: None   Collection Time: 07/22/21  6:38 PM   Specimen: Nasal Mucosa; Nasal Swab  Result Value Ref Range Status   MRSA by PCR Next Gen NOT DETECTED NOT DETECTED Final    Comment: (NOTE) The GeneXpert MRSA Assay (FDA approved for NASAL specimens only), is one component of a comprehensive MRSA colonization surveillance program. It is not intended to diagnose MRSA infection nor to guide or monitor treatment for MRSA infections. Test performance is not FDA approved in patients less than 91 years old. Performed at [facility], [place] [address] [place],  [address]      Radiology Studies: No results found.  Scheduled Meds:  Chlorhexidine Gluconate Cloth  6 each Topical Daily   Chlorhexidine Gluconate Cloth  6 each Topical Q0600   heparin injection (subcutaneous)  5,000 Units Subcutaneous Q8H   neomycin-bacitracin-polymyxin   Topical TID   pantoprazole (PROTONIX) IV  40 mg Intravenous Q24H   sodium chloride flush  10-40 mL Intracatheter Q12H    sodium chloride flush  3 mL Intravenous Q12H   Continuous Infusions:  sodium chloride 10 mL/hr at 07/27/21 0654     LOS: 6 days   Shelly Coss, MD [facility] P7/13/2023, 7:51 AM

## 2021-07-28 NOTE — Progress Notes (Signed)
[name] Progress Note   Subjective:   transferred to floor from ICU yesterday.  UOP 63mL  yesterday.  S/p HD #2 yesterday.  Hemodynamically stable. Labs still need to be done for today.   Objective Vitals:   07/27/21 2136 07/28/21 0009 07/28/21 0458 07/28/21 0829  BP: 138/68 139/89 (!) 141/67 (!) 142/73  Pulse: 89 91 86 89  Resp: 18 20 18 20   Temp: 98 F (36.7 C) 97.9 F (36.6 C) (!) 97.5 F (36.4 C) 97.8 F (36.6 C)  TempSrc: Oral Oral Oral Oral  SpO2: 100% 100% 100% 100%  Weight:      Height:       Physical Exam General:  calmly resting at 30 deg in bed, sister bedside Heart: RRR Lungs: coarse ant, no rales Abdomen: soft, midline dressing c/d/I, JP drain in place; NG out Extremities: minor edema Dialysis Access:  none,  LIJ temp HD cath  Additional Objective Labs: Basic Metabolic Panel: Recent Labs  Lab 07/25/21 0343 07/26/21 0556 07/27/21 0332  NA 142 141 140  K 4.7 4.1 4.7  CL 107 102 103  CO2 14* 20* 20*  GLUCOSE 97 90 82  BUN 108* 78* 94*  CREATININE 8.49* 6.77* 8.15*  CALCIUM 6.8* 7.4* 7.8*  PHOS  --  6.7* 9.2*    Liver Function Tests: Recent Labs  Lab 07/21/21 2056 07/23/21 0319 07/26/21 0556 07/27/21 0332  AST 27 36  --   --   ALT 17 34  --   --   ALKPHOS 56 36*  --   --   BILITOT 0.6 1.0  --   --   PROT 6.0* 5.0*  --   --   ALBUMIN 2.7* 2.4* 1.7* 1.8*    Recent Labs  Lab 07/21/21 2056  LIPASE 24    CBC: Recent Labs  Lab 07/23/21 0919 07/24/21 0507 07/24/21 0524 07/25/21 0343 07/26/21 0556 07/27/21 0332  WBC 11.5*  --  10.9* 13.5* 11.2* 10.4  NEUTROABS 9.0*  --   --   --  8.9* 7.9*  HGB 10.0*   < > 8.8* 9.2* 8.6* 8.7*  HCT 29.4*   < > 26.5* 26.3* 24.6* 25.3*  MCV 93.3  --  93.0 89.8 90.8 91.0  PLT 202  --  142* 136* 130* 129*   < > = values in this interval not displayed.    Blood Culture    Component Value Date/Time   SDES BLOOD RIGHT ANTECUBITAL 07/21/2021 1915   SPECREQUEST  07/21/2021 1915     BOTTLES DRAWN AEROBIC ONLY Blood Culture results may not be optimal due to an inadequate volume of blood received in culture bottles   CULT  07/21/2021 1915    NO GROWTH 5 DAYS Performed at [facility], [place] [address] [place], [place] [address]    REPTSTATUS 07/26/2021 FINAL 07/21/2021 1915    Cardiac Enzymes: Recent Labs  Lab 07/22/21 0246  CKTOTAL 304*    CBG: Recent Labs  Lab 07/27/21 1513 07/27/21 1933 07/28/21 0012 07/28/21 0501 07/28/21 0757  GLUCAP 89 94 78 80 68*    Iron Studies: No results for input(s): "IRON", "TIBC", "TRANSFERRIN", "FERRITIN" in the last 72 hours. @lablastinr3 @ Studies/Results: No results found. Medications:  sodium chloride 10 mL/hr at 07/27/21 0654    alteplase  2 mg Intracatheter Once   Chlorhexidine Gluconate Cloth  6 each Topical Daily   Chlorhexidine Gluconate Cloth  6 each Topical Q0600   heparin injection (subcutaneous)  5,000 Units Subcutaneous  Q8H   neomycin-bacitracin-polymyxin   Topical TID   pantoprazole (PROTONIX) IV  40 mg Intravenous Q24H   sodium chloride flush  10-40 mL Intracatheter Q12H   sodium chloride flush  3 mL Intravenous Q12H    Assessment/Plan: 1.  Acute kidney injury chronic kidney disease stage IIIa: Appears consistent with ATN of septic shock.  Initiated HD 7/10 for worsening azotemia/metabolic acidosis. Hopefully will be temporary HD until she has renal recovery.  She remains ~anuric.  HD today and daily assessment for need.  Plan is next tomorrow.  F/u today's labs make sure acceptable.   Avoid nephrotoxic medications including NSAIDs and iodinated intravenous contrast exposure unless the latter is absolutely indicated.  Preferred narcotic agents for pain control are hydromorphone, fentanyl, and methadone. Morphine should not be used. Avoid Baclofen and avoid oral sodium phosphate and magnesium citrate based laxatives / bowel preps. Continue strict Input and Output monitoring. Will monitor the patient  closely with you and intervene or adjust therapy as indicated by changes in clinical status/labs.  2.  Anion gap metabolic acidosis: Secondary to acute kidney injury, resolved. 3.  Incarcerated ventral hernia: Status post exploratory laparotomy with small bowel resection, adhesiolysis and hernia repair with JP drain placement.  Ongoing follow-up by surgery. 4.  Peritonitis: With resolution of septic shock, weaned off of pressors and remains on corticosteroids.  On intravenous Zosyn. 5.  Anemia: Possibly chronic from malabsorption given prior history of small bowel resection.  Exacerbated by acute/critical illness and perioperative blood losses.  Hb in the 8-9s currently.  6. Hyperphosphatemia: phos 9.2, follow - secondary to AKI: start binder when eating if remains issue. Currently on clears only so no binder  Will follow, page with concerns.  Jannifer Hick MD 07/28/2021, 8:35 AM  [facility] Pager: [phone]

## 2021-07-29 ENCOUNTER — Inpatient Hospital Stay (HOSPITAL_COMMUNITY): Payer: BC Managed Care – PPO

## 2021-07-29 DIAGNOSIS — K436 Other and unspecified ventral hernia with obstruction, without gangrene: Secondary | ICD-10-CM | POA: Diagnosis not present

## 2021-07-29 DIAGNOSIS — E43 Unspecified severe protein-calorie malnutrition: Secondary | ICD-10-CM | POA: Insufficient documentation

## 2021-07-29 HISTORY — PX: IR FLUORO GUIDE CV LINE RIGHT: IMG2283

## 2021-07-29 HISTORY — PX: IR US GUIDE VASC ACCESS RIGHT: IMG2390

## 2021-07-29 LAB — CBC WITH DIFFERENTIAL/PLATELET
Abs Immature Granulocytes: 0.3 10*3/uL — ABNORMAL HIGH (ref 0.00–0.07)
Basophils Absolute: 0 10*3/uL (ref 0.0–0.1)
Basophils Relative: 0 %
Eosinophils Absolute: 0.2 10*3/uL (ref 0.0–0.5)
Eosinophils Relative: 2 %
HCT: 27.7 % — ABNORMAL LOW (ref 36.0–46.0)
Hemoglobin: 9.2 g/dL — ABNORMAL LOW (ref 12.0–15.0)
Immature Granulocytes: 3 %
Lymphocytes Relative: 8 %
Lymphs Abs: 1 10*3/uL (ref 0.7–4.0)
MCH: 30.9 pg (ref 26.0–34.0)
MCHC: 33.2 g/dL (ref 30.0–36.0)
MCV: 93 fL (ref 80.0–100.0)
Monocytes Absolute: 1.2 10*3/uL — ABNORMAL HIGH (ref 0.1–1.0)
Monocytes Relative: 10 %
Neutro Abs: 9 10*3/uL — ABNORMAL HIGH (ref 1.7–7.7)
Neutrophils Relative %: 77 %
Platelets: 273 10*3/uL (ref 150–400)
RBC: 2.98 MIL/uL — ABNORMAL LOW (ref 3.87–5.11)
RDW: 18.6 % — ABNORMAL HIGH (ref 11.5–15.5)
WBC: 11.7 10*3/uL — ABNORMAL HIGH (ref 4.0–10.5)
nRBC: 0 % (ref 0.0–0.2)

## 2021-07-29 LAB — IRON AND TIBC
Iron: 23 ug/dL — ABNORMAL LOW (ref 28–170)
Saturation Ratios: 14 % (ref 10.4–31.8)
TIBC: 164 ug/dL — ABNORMAL LOW (ref 250–450)
UIBC: 141 ug/dL

## 2021-07-29 LAB — GLUCOSE, CAPILLARY
Glucose-Capillary: 76 mg/dL (ref 70–99)
Glucose-Capillary: 92 mg/dL (ref 70–99)
Glucose-Capillary: 92 mg/dL (ref 70–99)

## 2021-07-29 LAB — FERRITIN: Ferritin: 162 ng/mL (ref 11–307)

## 2021-07-29 MED ORDER — METOPROLOL TARTRATE 5 MG/5ML IV SOLN
2.5000 mg | Freq: Four times a day (QID) | INTRAVENOUS | Status: DC | PRN
Start: 2021-07-29 — End: 2021-08-16
  Administered 2021-07-29 – 2021-07-30 (×2): 2.5 mg via INTRAVENOUS
  Filled 2021-07-29 (×2): qty 5

## 2021-07-29 MED ORDER — METOPROLOL TARTRATE 12.5 MG HALF TABLET
12.5000 mg | ORAL_TABLET | Freq: Two times a day (BID) | ORAL | Status: DC
  Administered 2021-07-29 – 2021-07-30 (×3): 12.5 mg via ORAL
  Filled 2021-07-29 (×3): qty 1

## 2021-07-29 MED ORDER — ANTICOAGULANT SODIUM CITRATE 4% (200MG/5ML) IV SOLN: 5.0000 mL | Status: DC | PRN

## 2021-07-29 MED ORDER — HEPARIN SODIUM (PORCINE) 1000 UNIT/ML DIALYSIS
1000.0000 [IU] | INTRAMUSCULAR | Status: DC | PRN
  Administered 2021-07-29: 3200 [IU]
  Filled 2021-07-29 (×3): qty 1

## 2021-07-29 MED ORDER — LIDOCAINE HCL (PF) 1 % IJ SOLN
INTRAMUSCULAR | Status: AC | PRN
  Administered 2021-07-29: 8 mL

## 2021-07-29 MED ORDER — SODIUM CHLORIDE 0.9 % IV SOLN
250.0000 mg | Freq: Every day | INTRAVENOUS | Status: AC
  Administered 2021-07-29 – 2021-07-30 (×2): 250 mg via INTRAVENOUS
  Filled 2021-07-29 (×2): qty 20

## 2021-07-29 MED ORDER — HEPARIN SODIUM (PORCINE) 1000 UNIT/ML DIALYSIS
1000.0000 [IU] | INTRAMUSCULAR | Status: DC | PRN
  Filled 2021-07-29: qty 1

## 2021-07-29 MED ORDER — NEPRO/CARBSTEADY PO LIQD
237.0000 mL | Freq: Two times a day (BID) | ORAL | Status: DC
  Administered 2021-07-29 – 2021-08-16 (×24): 237 mL via ORAL

## 2021-07-29 MED ORDER — LIDOCAINE HCL 1 % IJ SOLN
INTRAMUSCULAR | Status: AC
  Filled 2021-07-29: qty 20

## 2021-07-29 MED ORDER — ALTEPLASE 2 MG IJ SOLR: 2.0000 mg | Freq: Once | INTRAMUSCULAR | Status: DC | PRN

## 2021-07-29 MED ORDER — HEPARIN SODIUM (PORCINE) 1000 UNIT/ML IJ SOLN
INTRAMUSCULAR | Status: AC
  Administered 2021-07-30: 1000 [IU]
  Filled 2021-07-29: qty 10

## 2021-07-29 NOTE — Progress Notes (Signed)
Physical Therapy Treatment Patient Details Name: [name] MRN: [id number] DOB: 1955-03-03 Today's Date: 07/29/2021   History of Present Illness Pt is a 66 y.o. female admitted 07/21/21 with worsening abdominal pain. S/p emergent ex lap, small bowel resection, adhesiolysis x90 min, incarcerated ventral hernia repair with JP drain placement on 7/7. ETT 7/7-7/11. Course complicated by AKI on CKD 3; HD initiated 7/10. NGT removed 7/13. Temp catheter not working in HD 7/14; to IR for exchange. PMH includes recurrent ventral hernia s/p hernia and small bowel repairs, HTN.   PT Comments    Pt with tachycardia at rest and with bed-level mobility, HR jumping between 117-155; pt endorses feeling heart "thumping." Increased time discussing d/c planning as pt declined recommendation for SNF; pt reports she will have assist from two sisters, but does not own any DME - pt likely to require BSC, RW, and w/c, as well as assist +2 for mobility pending activity progression; pt declines need for hospital bed. RN aware of HR and multiple blisters present on pt's upper legs and lower abdomen. Transport arrived during session for IR. Will continue to follow acutely to address established goals.    Recommendations for follow up therapy are one component of a multi-disciplinary discharge planning process, led by the attending physician.  Recommendations may be updated based on patient status, additional functional criteria and insurance authorization.  Follow Up Recommendations  Skilled nursing-short term rehab (<3 hours/day) (pt declined) Can patient physically be transported by private vehicle:  (TBD)   Assistance Recommended at Discharge Frequent or constant Supervision/Assistance  Patient can return home with the following A lot of help with walking and/or transfers;A lot of help with bathing/dressing/bathroom;Assistance with cooking/housework;Assist for transportation;Help with stairs or ramp for entrance    Equipment Recommendations  Rolling walker (2 wheels);BSC/3in1;Wheelchair (measurements PT);Wheelchair cushion (measurements PT)    Recommendations for Other Services       Precautions / Restrictions Precautions Precautions: Fall;Other (comment) Precaution Comments: watch HR; R abdominal JP drain; multiple blisters on lower abdomen and upper thighs Restrictions Weight Bearing Restrictions: No     Mobility  Bed Mobility Overal bed mobility: Needs Assistance Bed Mobility: Rolling Rolling: Mod assist         General bed mobility comments: ModA to roll and scoot hips towards L side, maxA to scoot up in bed; left in modified chair position    Transfers                   General transfer comment:  (initially deferred secondary to HR jumping rapidly between 117-155 while supine, pt endorses feeling heart "thumping"; then arrival of transport for IR)    Ambulation/Gait                   Stairs             Wheelchair Mobility    Modified Rankin (Stroke Patients Only)       Balance                                            Cognition Arousal/Alertness: Awake/alert Behavior During Therapy: Flat affect Overall Cognitive Status: No family/caregiver present to determine baseline cognitive functioning Area of Impairment: Attention, Following commands, Safety/judgement, Awareness, Problem solving                   Current Attention Level: Sustained, Selective  Following Commands: Follows one step commands with increased time Safety/Judgement: Decreased awareness of deficits Awareness: Emergent Problem Solving: Slow processing General Comments: difficult to determine true cognitive impairment vs fatigue. more conversant and answering questions appropriately; still with some slowed processing, suspect decreased insight into current deficits, including amount of assist she will need at home        Exercises      General  Comments General comments (skin integrity, edema, etc.): mobility limited by tachycardia and arrival of transport for IR. noted blisters along R thigh and lower abdomen; RN aware. increased time discussing d/c planning as pt declined SNF - pt reports will have assist from two sisters, does not own any DME (she gave it away), but likely to need RW, BSC, and w/c, pt declines hospital bed; pt reports level home with no steps, w/c would fit into bathroom      Pertinent Vitals/Pain Pain Assessment Pain Assessment: Faces Faces Pain Scale: Hurts little more Pain Location: abdomen Pain Descriptors / Indicators: Discomfort Pain Intervention(s): Monitored during session, Repositioned    Home Living                          Prior Function            PT Goals (current goals can now be found in the care plan section) Acute Rehab PT Goals Patient Stated Goal: return home Progress towards PT goals: Not progressing toward goals - comment    Frequency    Min 3X/week      PT Plan Current plan remains appropriate    Co-evaluation              AM-PAC PT "6 Clicks" Mobility   Outcome Measure  Help needed turning from your back to your side while in a flat bed without using bedrails?: A Lot Help needed moving from lying on your back to sitting on the side of a flat bed without using bedrails?: A Lot Help needed moving to and from a bed to a chair (including a wheelchair)?: A Lot Help needed standing up from a chair using your arms (e.g., wheelchair or bedside chair)?: A Lot Help needed to walk in hospital room?: Total Help needed climbing 3-5 steps with a railing? : Total 6 Click Score: 10    End of Session   Activity Tolerance: Treatment limited secondary to medical complications (Comment) Patient left: in bed;with call bell/phone within reach (with transport for IR) Nurse Communication: Mobility status PT Visit Diagnosis: Other abnormalities of gait and mobility  (R26.89);Muscle weakness (generalized) (M62.81);Difficulty in walking, not elsewhere classified (R26.2);Pain     Time: 8882-8003 PT Time Calculation (min) (ACUTE ONLY): 17 min  Charges:  $Self Care/Home Management: Hendry, PT, DPT Acute Rehabilitation Services  Personal: [facility] Office: [place] 07/29/2021, 4:13 PM

## 2021-07-29 NOTE — Procedures (Signed)
Interventional Radiology Procedure Note  Procedure: Placement of a right IJ approach temp HD. 16cm. Tip is positioned at the superior cavoatrial junction and catheter is ready for immediate use.  Complications: None Recommendations:  - Ok to use - Do not submerge - Routine line care   Signed,  Dulcy Fanny. Earleen Newport, DO

## 2021-07-29 NOTE — Progress Notes (Signed)
Pt presented to dialysis treatment alert and oriented x4.  Upon pre treatment assessment, Arterial lumen was able  to be pushed and pulled.  However, venous lumen would push but would not pull.   Treatment was initiated and alarm reading low arterial pressure sounded.  Arterial pressure reading was -340.  Lines ere reversed without success.  Patient's catheter lumens were twisted into a downward position impeding blood flow.   There was documentation in patient's record on 25 July 2021 that stated the catheter was placed up against the wall.   Pt sent back to her primary floor for pending cath placement assessment and possible replacement.

## 2021-07-29 NOTE — Progress Notes (Signed)
Patient ID: [name], female   DOB: 1955/10/13, 66 y.o.   MRN: [id number] [facility] Surgery Progress Note  7 Days Post-Op  Subjective: Tolerating CLD without nausea or vomiting, having bowel function. Loose BMs with some urgency. She was recommended for SNF yesterday by PT but prefers to go home with family when able.   Objective: Vital signs in last 24 hours: Temp:  [97.4 F (36.3 C)-97.8 F (36.6 C)] 97.8 F (36.6 C) (07/14 0836) Pulse Rate:  [87-100] 90 (07/14 0836) Resp:  [14-21] 18 (07/14 0836) BP: (134-157)/(62-79) 157/79 (07/14 0836) SpO2:  [98 %-100 %] 98 % (07/14 0836) Last BM Date : 07/29/21  Intake/Output from previous day: 07/13 0701 - 07/14 0700 In: -  Out: 10 [Drains:10] Intake/Output this shift: No intake/output data recorded.  PE: Abd: soft, mild diffuse tenderness without rebound or guarding, no peritonitis, JP serosanguinous with scant drainage. Incision C/D/I with staples present.  Lab Results:  Recent Labs    07/27/21 0332 07/29/21 0650  WBC 10.4 11.7*  HGB 8.7* 9.2*  HCT 25.3* 27.7*  PLT 129* 273    BMET Recent Labs    07/27/21 0332  NA 140  K 4.7  CL 103  CO2 20*  GLUCOSE 82  BUN 94*  CREATININE 8.15*  CALCIUM 7.8*    PT/INR No results for input(s): "LABPROT", "INR" in the last 72 hours.  CMP     Component Value Date/Time   NA 140 07/27/2021 0332   NA 146 (H) 07/11/2017 1547   K 4.7 07/27/2021 0332   CL 103 07/27/2021 0332   CO2 20 (L) 07/27/2021 0332   GLUCOSE 82 07/27/2021 0332   BUN 94 (H) 07/27/2021 0332   BUN 14 07/11/2017 1547   CREATININE 8.15 (H) 07/27/2021 0332   CREATININE 1.04 05/06/2014 1538   CALCIUM 7.8 (L) 07/27/2021 0332   PROT 5.0 (L) 07/23/2021 0319   PROT 6.1 07/11/2017 1547   ALBUMIN 1.8 (L) 07/27/2021 0332   ALBUMIN 3.5 (L) 07/11/2017 1547   AST 36 07/23/2021 0319   ALT 34 07/23/2021 0319   ALKPHOS 36 (L) 07/23/2021 0319   BILITOT 1.0 07/23/2021 0319   BILITOT <0.2 07/11/2017 1547    GFRNONAA 5 (L) 07/27/2021 0332   GFRAA 50 (L) 09/13/2019 0755   Lipase     Component Value Date/Time   LIPASE 24 07/21/2021 2056       Studies/Results: No results found.  Anti-infectives: Anti-infectives (From admission, onward)    Start     Dose/Rate Route Frequency Ordered Stop   07/23/21 1730  piperacillin-tazobactam (ZOSYN) IVPB 2.25 g        2.25 g 100 mL/hr over 30 Minutes Intravenous Every 8 hours 07/23/21 1207 07/28/21 1019   07/22/21 0900  piperacillin-tazobactam (ZOSYN) IVPB 2.25 g  Status:  Discontinued        2.25 g 100 mL/hr over 30 Minutes Intravenous Every 8 hours 07/22/21 0833 07/23/21 1207   07/22/21 0830  piperacillin-tazobactam (ZOSYN) IVPB 3.375 g  Status:  Discontinued        3.375 g 100 mL/hr over 30 Minutes Intravenous Every 8 hours 07/22/21 0827 07/22/21 0831   07/22/21 0000  piperacillin-tazobactam (ZOSYN) IVPB 3.375 g        3.375 g 100 mL/hr over 30 Minutes Intravenous  Once 07/21/21 2352 07/22/21 0142        Assessment/Plan POD#7 s/p  exploratory laparotomy, small bowel resection, adhesiolysis x90 minutes, primary repair of incarcerated ventral hernia, JP drain placement 7/7  Dr. Bobbye Morton for incarcerated and strangulated ventral hernia - monitor JP, serosanguinous - FLD and ADAT to soft, monitor stools - may need to add fiber if still having a lot of fecal urgency and loose stools but she is certainly at risk for this given length of small bowel remaining  - ok to add protein supplement if ok with renal  - completed 5 days post-op abx, WBC normalized yesterday  - encourage mobilization   ID - zosyn 7/7>7/12 FEN - FLD and ADAT to soft  VTE - SQH Foley - not present, had wick this AM but stool present as well   - below per TRH -   Septic shock - off pressors 7/9 ARF - HD started 7/10 VDRF - extubated 7/11 Elevated troponin - cards has seen, suspect secondary to critical illness and not ACS HTN Anemia - hgb stable     LOS: 7 days     Norm Parcel, [facility] Surgery 07/29/2021, 9:16 AM Please see Amion for pager number during day hours 7:00am-4:30pm

## 2021-07-29 NOTE — Evaluation (Signed)
Occupational Therapy Evaluation Patient Details Name: [name] MRN: [id number] DOB: [birth year] Today's Date: 07/29/2021   History of Present Illness Pt is a 66 y.o. female admitted 07/21/21 with worsening abdominal pain. S/p emergent ex lap, small bowel resection, adhesiolysis x90 min, incarcerated ventral hernia repair with JP drain placement on 7/7. ETT 7/7-7/11. Course complicated by AKI on CKD 3; HD initiated 7/10. NGT removed 7/13. PMH includes recurrent ventral hernia s/p hernia and small bowel repairs, HTN.   Clinical Impression   Patient admitted for the diagnosis above.  PTA she lives at home with family nearby.  Patient was independent with ADL, iADL, mobility and continues to drive.  Deficits impacting function are listed below.  Patient presenting with increased alertness, and wanting to move.  Eval cut short as transportation arrive, and patient taken to dialysis.  OT will follow in the acute setting to continue with out of bed and advancement of ADL independence.  Currently SNF level rehab for post acute is recommended prior to returning home.         Recommendations for follow up therapy are one component of a multi-disciplinary discharge planning process, led by the attending physician.  Recommendations may be updated based on patient status, additional functional criteria and insurance authorization.   Follow Up Recommendations  Skilled nursing-short term rehab (<3 hours/day)    Assistance Recommended at Discharge Frequent or constant Supervision/Assistance  Patient can return home with the following Two people to help with walking and/or transfers;Help with stairs or ramp for entrance;Assist for transportation;Assistance with cooking/housework;Direct supervision/assist for medications management;A lot of help with bathing/dressing/bathroom    Functional Status Assessment  Patient has had a recent decline in their functional status and demonstrates the ability to make  significant improvements in function in a reasonable and predictable amount of time.  Equipment Recommendations  BSC/3in1;Tub/shower seat;Wheelchair (measurements OT);Wheelchair cushion (measurements OT)    Recommendations for Other Services       Precautions / Restrictions Precautions Precautions: Fall;Other (comment) Precaution Comments: R abdominal JP drain; watch HR Restrictions Weight Bearing Restrictions: No      Mobility Bed Mobility Overal bed mobility: Needs Assistance Bed Mobility: Rolling Rolling: Mod assist              Transfers                          Balance                                           ADL either performed or assessed with clinical judgement   ADL Overall ADL's : Needs assistance/impaired     Grooming: Wash/dry hands;Wash/dry face;Set up;Bed level   Upper Body Bathing: Moderate assistance;Bed level   Lower Body Bathing: Total assistance;Bed level   Upper Body Dressing : Moderate assistance;Bed level   Lower Body Dressing: Total assistance;Bed level       Toileting- Clothing Manipulation and Hygiene: Total assistance;Bed level               Vision Patient Visual Report: No change from baseline       Perception Perception Perception: Not tested   Praxis Praxis Praxis: Not tested    Pertinent Vitals/Pain Pain Assessment Pain Assessment: Faces Faces Pain Scale: Hurts little more Pain Location: abdomen, R knee Pain Descriptors / Indicators: Discomfort, Grimacing, Guarding Pain Intervention(s): Monitored  during session     Hand Dominance Right   Extremity/Trunk Assessment Upper Extremity Assessment Upper Extremity Assessment: Generalized weakness;RUE deficits/detail;LUE deficits/detail RUE Deficits / Details: Decreased end range shoulder flexion.  Able to bring her hand to her mouth, R UE weaker than left. RUE Sensation: WNL RUE Coordination: WNL LUE Deficits / Details: MMT  grossly 3+/5 with full AROM LUE Sensation: WNL LUE Coordination: WNL   Lower Extremity Assessment Lower Extremity Assessment: Defer to PT evaluation   Cervical / Trunk Assessment Cervical / Trunk Assessment: Kyphotic   Communication Communication Communication: No difficulties   Cognition Arousal/Alertness: Awake/alert, Lethargic Behavior During Therapy: Flat affect Overall Cognitive Status: Within Functional Limits for tasks assessed                                 General Comments: Increased LOA, oriented, conversing and following commands well.     General Comments       Exercises General Exercises - Upper Extremity Shoulder Flexion: AAROM, Both, 10 reps, Supine   Shoulder Instructions      Home Living Family/patient expects to be discharged to:: Private residence Living Arrangements: Alone Available Help at Discharge: Family;Available PRN/intermittently Type of Home: House Home Access: Stairs to enter   Entrance Stairs-Rails: Right;Left Home Layout: One level     Bathroom Shower/Tub: Occupational psychologist: Handicapped height Bathroom Accessibility: Yes How Accessible: Accessible via walker Home Equipment: None          Prior Functioning/Environment Prior Level of Function : Independent/Modified Independent;Driving             Mobility Comments: no AD needed ADLs Comments: Able to care for her own ADL and iADL without assist.  Sister lives nearby.        OT Problem List: Decreased strength;Decreased range of motion;Decreased activity tolerance;Impaired balance (sitting and/or standing);Decreased knowledge of use of DME or AE;Pain      OT Treatment/Interventions: Self-care/ADL training;Therapeutic exercise;Therapeutic activities;Patient/family education;Balance training;DME and/or AE instruction;Energy conservation    OT Goals(Current goals can be found in the care plan section) Acute Rehab OT Goals Patient Stated Goal:  Get better and go back home OT Goal Formulation: With patient Time For Goal Achievement: 08/12/21 Potential to Achieve Goals: Good ADL Goals Pt Will Perform Grooming: with supervision;sitting Pt Will Perform Upper Body Bathing: with min assist;sitting Pt Will Perform Upper Body Dressing: with min assist;sitting Pt Will Transfer to Toilet: with mod assist;stand pivot transfer;bedside commode Pt/caregiver will Perform Home Exercise Program: Increased ROM;Increased strength;Both right and left upper extremity;With Supervision  OT Frequency: Min 2X/week    Co-evaluation              AM-PAC OT "6 Clicks" Daily Activity     Outcome Measure Help from another person eating meals?: A Little Help from another person taking care of personal grooming?: A Little Help from another person toileting, which includes using toliet, bedpan, or urinal?: A Lot Help from another person bathing (including washing, rinsing, drying)?: A Lot Help from another person to put on and taking off regular upper body clothing?: A Lot Help from another person to put on and taking off regular lower body clothing?: Total 6 Click Score: 13   End of Session Nurse Communication: Mobility status  Activity Tolerance: Patient tolerated treatment well Patient left: in bed;with call bell/phone within reach  OT Visit Diagnosis: Unsteadiness on feet (R26.81);Muscle weakness (generalized) (M62.81)  Time: 1000-1011 OT Time Calculation (min): 11 min Charges:  OT General Charges $OT Visit: 1 Visit OT Evaluation $OT Eval Moderate Complexity: 1 Mod  07/29/2021  [name], [name]  Acute Rehabilitation Services  Office:  [phone]   [name] 07/29/2021, 10:23 AM

## 2021-07-29 NOTE — Progress Notes (Signed)
Nutrition Brief Note  Pt more awake at today's visit. Diet advanced to soft, per pt she has tolerated some liquids.  No UOP. Renal following for continued temporary HD.   Pt is at high risk of malnutrition with remaining 130 cm small bowel.   Agreeable to trying Nepro shakes.   Lockie Pares., RD, LDN, CNSC See AMiON for contact information

## 2021-07-29 NOTE — Progress Notes (Signed)
PT Cancellation Note  Patient Details Name: [name] MRN: [id number] DOB: Sep 30, 1955   Cancelled Treatment:    Reason Eval/Treat Not Completed: Patient at procedure or test/unavailable (HD). Will follow-up for PT treatment as schedule permits.  Mabeline Caras, PT, DPT Acute Rehabilitation Services  Personal: [facility] Office: [place] 07/29/2021, 12:19 PM

## 2021-07-29 NOTE — Progress Notes (Signed)
[facility] Progress Note   Subjective:  Feeling improved, still on clears alone.  UOP 18mL  yesterday per I/Os but per nursing student has had some incontinent voids but now has purewice.  Labs unable to be collected on floor yesterday - sending from HD today  Objective Vitals:   07/28/21 1629 07/28/21 1947 07/28/21 2351 07/29/21 0342  BP: 135/69 (!) 142/65 134/62 (!) 146/72  Pulse: 93 99 93 87  Resp: 20 (!) 21 19 16   Temp: (!) 97.5 F (36.4 C) (!) 97.4 F (36.3 C) (!) 97.5 F (36.4 C) (!) 97.5 F (36.4 C)  TempSrc: Oral Oral Oral Oral  SpO2: 100% 100% 98% 98%  Weight:      Height:       Physical Exam General:  calmly resting at 30 deg in bed, sister bedside  Heart: RRR Lungs: clear ant, no rales Abdomen: soft, midline dressing c/d/I, JP drain in place Extremities: minor edema Dialysis Access:  none,  LIJ temp HD cath  Additional Objective Labs: Basic Metabolic Panel: Recent Labs  Lab 07/25/21 0343 07/26/21 0556 07/27/21 0332  NA 142 141 140  K 4.7 4.1 4.7  CL 107 102 103  CO2 14* 20* 20*  GLUCOSE 97 90 82  BUN 108* 78* 94*  CREATININE 8.49* 6.77* 8.15*  CALCIUM 6.8* 7.4* 7.8*  PHOS  --  6.7* 9.2*    Liver Function Tests: Recent Labs  Lab 07/23/21 0319 07/26/21 0556 07/27/21 0332  AST 36  --   --   ALT 34  --   --   ALKPHOS 36*  --   --   BILITOT 1.0  --   --   PROT 5.0*  --   --   ALBUMIN 2.4* 1.7* 1.8*    No results for input(s): "LIPASE", "AMYLASE" in the last 168 hours.  CBC: Recent Labs  Lab 07/24/21 0524 07/25/21 0343 07/26/21 0556 07/27/21 0332 07/29/21 0650  WBC 10.9* 13.5* 11.2* 10.4 11.7*  NEUTROABS  --   --  8.9* 7.9* 9.0*  HGB 8.8* 9.2* 8.6* 8.7* 9.2*  HCT 26.5* 26.3* 24.6* 25.3* 27.7*  MCV 93.0 89.8 90.8 91.0 93.0  PLT 142* 136* 130* 129* 273    Blood Culture    Component Value Date/Time   SDES BLOOD RIGHT ANTECUBITAL 07/21/2021 1915   SPECREQUEST  07/21/2021 1915    BOTTLES DRAWN AEROBIC ONLY Blood  Culture results may not be optimal due to an inadequate volume of blood received in culture bottles   CULT  07/21/2021 1915    NO GROWTH 5 DAYS Performed at [facility], [place] [address] [place], [place] [address]    REPTSTATUS 07/26/2021 FINAL 07/21/2021 1915    Cardiac Enzymes: No results for input(s): "CKTOTAL", "CKMB", "CKMBINDEX", "TROPONINI" in the last 168 hours.  CBG: Recent Labs  Lab 07/28/21 0757 07/28/21 1953 07/28/21 2357 07/29/21 0347 07/29/21 0833  GLUCAP 68* 104* 92 92 76    Iron Studies: No results for input(s): "IRON", "TIBC", "TRANSFERRIN", "FERRITIN" in the last 72 hours. @lablastinr3 @ Studies/Results: No results found. Medications:  sodium chloride 10 mL/hr at 07/27/21 0654   anticoagulant sodium citrate      Chlorhexidine Gluconate Cloth  6 each Topical Daily   Chlorhexidine Gluconate Cloth  6 each Topical Q0600   heparin injection (subcutaneous)  5,000 Units Subcutaneous Q8H   neomycin-bacitracin-polymyxin   Topical TID   pantoprazole (PROTONIX) IV  40 mg Intravenous Q24H   sodium chloride flush  10-40 mL  Intracatheter Q12H   sodium chloride flush  3 mL Intravenous Q12H    Assessment/Plan: 1.  Acute kidney injury chronic kidney disease stage IIIa: Appears consistent with ATN of septic shock.  Initiated HD 7/10 for worsening azotemia/metabolic acidosis. Hopefully will be temporary HD until she has renal recovery.  Report of UOP but suspect she's still oliguric but now has purewick.    HD today and daily assessment for need.   Obtaining labs has been an issue, but we need to follow daily labs.  Wouldn't be eligible for PICC in light of CKD.   Avoid nephrotoxic medications including NSAIDs and iodinated intravenous contrast exposure unless the latter is absolutely indicated.  Preferred narcotic agents for pain control are hydromorphone, fentanyl, and methadone. Morphine should not be used. Avoid Baclofen and avoid oral sodium phosphate and magnesium  citrate based laxatives / bowel preps. Continue strict Input and Output monitoring. Will monitor the patient closely with you and intervene or adjust therapy as indicated by changes in clinical status/labs.  2.  Anion gap metabolic acidosis: Secondary to acute kidney injury, resolved. 3.  Incarcerated ventral hernia: Status post exploratory laparotomy with small bowel resection, adhesiolysis and hernia repair with JP drain placement.  Ongoing follow-up by surgery. 4.  Peritonitis: With resolution of septic shock, weaned off of pressors and remains on corticosteroids.  completed course of Zosyn. 5.  Anemia: Possibly chronic from malabsorption given prior history of small bowel resection.  Exacerbated by acute/critical illness and perioperative blood losses.  Hb in the 8-9s currently.  Check iron indices.  6. Hyperphosphatemia: phos 9.2, follow - secondary to AKI: start binder when eating if remains issue. Currently on clears only so no binder  Will follow, page with concerns.  Jannifer Hick MD 07/29/2021, 8:36 AM  [facility] Pager: [phone]

## 2021-07-29 NOTE — Progress Notes (Signed)
Nephrology quick note:   Pt went to dialysis but temp HD catheter not working at all. RN reported there were issues with last treatment too.  7/10 CXR after placement showed tip of catheter probably against wall of SVC and since it's not working will need to be exchanged.   Consulted IR for replacement now that she's out of the ICU.  Would favor another temp line as this is AKI and she's starting to make urine.    Labs were able to be sent from dialysis catheter at dialysis and are currently pending.  There's been difficulty obtaining labs over the past 2 days but the dialysis catheter placed is a trialysis catheter and labs should be drawn from this.  No PICC in light of CKD.    Jannifer Hick MD [facility] Pager [phone]

## 2021-07-29 NOTE — Progress Notes (Signed)
Spoke w/ Dr. Johnney Ou and made her aware of access issues/can't run. Made her aware that cxray for cath tip verfication on 7/10 stated that the tip was likely up against the wall of the SVC and I asked if we could get her back to IR to get it pulled back or a new cath placed all together. She was in agreement to stop current HD tx and send pt back to room and that she would address access issues. Reported this to the HD RN running the pt.

## 2021-07-29 NOTE — Progress Notes (Signed)
PROGRESS NOTE  [name]  WNU:[id number] DOB: May 27, 1955 DOA: 07/21/2021 PCP: Mancel Bale, PA-C   Brief Narrative:  Patient is a 66 year old female with history of hypertension, CKD stage III a,hyperlipidemia, chronically incarcerated large abdominal wall hernia which was repaired multiple times, recent SBO resection with mesh placement who presented to the ER with severe abdominal pain, vomiting.  Lab work showed creatinine of 5.2, lactic acid of 6.5, leukocytosis.  CT abdomen/pelvis showed large SBO, large lower abdominal ventral hernia containing much of the small bowel.  General surgery consulted and she was taken to the OR for emergent exploratory laparotomy and hernia repair,she was transferred to ICU.  Currently hemodynamically stable and patient transferred to [facility] service on 7/13.  On dialysis for persistent AKI, nephrology following.  General surgery following.  PT recommending skilled nursing facility on discharge   Assessment & Plan:  Principal Problem:   Ventral hernia with bowel obstruction Active Problems:   SIRS (systemic inflammatory response syndrome) (HCC)   Lactic acidosis   Leukocytosis   Acute renal failure superimposed on stage 3b chronic kidney disease (HCC)   Transient hypotension   Hypokalemia   History of small bowel obstruction   Body mass index 40.0-44.9, adult ([place])   H/O exploratory laparotomy   Septic shock (HCC)   Ventilator dependence (HCC)   Protein-calorie malnutrition, severe   Incarcerated/strangulated ventral hernia: Status post small bowel resection, lysis of additions, repair of hernia and JP drain placement.  Also found to have peritonitis.  Completed antibiotics course.  Leukocytosis resolved.  General surgery following.  NG tube removed, diet advanced to full liquid.  Having bowel movements now.  AKI on CKD stage 3A: Likely from ATN from septic shock secondary to peritonitis.  Initiated hemodialysis on 7/10, nephrology following.   Temporal dialysis catheter not working, IR consulted for exchange/replacement of dialysis catheter.  Normocytic anemia: Likely associated with critical illness, chronic kidney disease.  Hemoglobin stable in the range of 8.  Continue to monitor.  Low iron as per labs,ordered iv iron  History of hypertension: Currently normotensive.  On amlodipine at home, currently on hold  Acute hypoxic respiratory failure: Had to be put on ventilator after surgery, currently extubated and on room air  Obesity: BMI of 37.1  Debility/deconditioning: Patient seen by PT and recommended skilled nursing facility on discharge.TOC following   Nutrition Problem: Severe Malnutrition Etiology: chronic illness (chronic incarcerated hernia)    DVT prophylaxis:heparin injection 5,000 Units Start: 07/22/21 2200     Code Status: Full Code  Family Communication: Sister at bedside  Patient status:Inpatient  Patient is from :Home  Anticipated discharge to:SNF  Estimated DC date:after surgical /nephrology clearance   Consultants: General surgery  Procedures: Intubation, exploratory laparotomy  Antimicrobials:  Anti-infectives (From admission, onward)    Start     Dose/Rate Route Frequency Ordered Stop   07/23/21 1730  piperacillin-tazobactam (ZOSYN) IVPB 2.25 g        2.25 g 100 mL/hr over 30 Minutes Intravenous Every 8 hours 07/23/21 1207 07/28/21 1019   07/22/21 0900  piperacillin-tazobactam (ZOSYN) IVPB 2.25 g  Status:  Discontinued        2.25 g 100 mL/hr over 30 Minutes Intravenous Every 8 hours 07/22/21 0833 07/23/21 1207   07/22/21 0830  piperacillin-tazobactam (ZOSYN) IVPB 3.375 g  Status:  Discontinued        3.375 g 100 mL/hr over 30 Minutes Intravenous Every 8 hours 07/22/21 0827 07/22/21 0831   07/22/21 0000  piperacillin-tazobactam (ZOSYN) IVPB 3.375  g        3.375 g 100 mL/hr over 30 Minutes Intravenous  Once 07/21/21 2352 07/22/21 0142       Subjective: Patient seen and  examined at the bedside this morning.  Looks better than yesterday, more alert and awake.  Had bowel movement.  Denies any abdominal pain nausea or vomiting.  Objective: Vitals:   07/29/21 0342 07/29/21 0836 07/29/21 1020 07/29/21 1035  BP: (!) 146/72 (!) 157/79 136/63   Pulse: 87 90 (!) 148   Resp: 16 18 18    Temp: (!) 97.5 F (36.4 C) 97.8 F (36.6 C) 97.9 F (36.6 C)   TempSrc: Oral Oral    SpO2: 98% 98% 96%   Weight:    86.9 kg  Height:        Intake/Output Summary (Last 24 hours) at 07/29/2021 1249 Last data filed at 07/29/2021 0600 Gross per 24 hour  Intake --  Output 10 ml  Net -10 ml   Filed Weights   07/27/21 1028 07/27/21 1304 07/29/21 1035  Weight: 90.7 kg 89.2 kg 86.9 kg    Examination:   General exam: Overall comfortable, not in distress, looks deconditioned, weak HEENT: PERRL, temporary dialysis catheter on the left neck Respiratory system:  no wheezes or crackles  Cardiovascular system: S1 & S2 heard, RRR.  Gastrointestinal system: Abdomen is nondistended, soft and nontender.  Midline surgical wound with staples, JP drain in the left lower quadrant.  Bowel sounds present Central nervous system: Alert and oriented Extremities: No edema, no clubbing ,no cyanosis Skin: No rashes, no ulcers,no icterus      Data Reviewed: I have personally reviewed following labs and imaging studies  CBC: Recent Labs  Lab 07/23/21 0919 07/24/21 0507 07/24/21 0524 07/25/21 0343 07/26/21 0556 07/27/21 0332 07/29/21 0650  WBC 11.5*  --  10.9* 13.5* 11.2* 10.4 11.7*  NEUTROABS 9.0*  --   --   --  8.9* 7.9* 9.0*  HGB 10.0*   < > 8.8* 9.2* 8.6* 8.7* 9.2*  HCT 29.4*   < > 26.5* 26.3* 24.6* 25.3* 27.7*  MCV 93.3  --  93.0 89.8 90.8 91.0 93.0  PLT 202  --  142* 136* 130* 129* 273   < > = values in this interval not displayed.   Basic Metabolic Panel: Recent Labs  Lab 07/24/21 0524 07/25/21 0343 07/26/21 0556 07/27/21 0332 07/29/21 1107  NA 141 142 141 140 140  K  4.1 4.7 4.1 4.7 4.2  CL 105 107 102 103 98  CO2 15* 14* 20* 20* 20*  GLUCOSE 137* 97 90 82 102*  BUN 96* 108* 78* 94* 77*  CREATININE 7.68* 8.49* 6.77* 8.15* 8.24*  CALCIUM 6.2* 6.8* 7.4* 7.8* 8.5*  MG  --   --  1.9  --   --   PHOS  --   --  6.7* 9.2* 8.6*     Recent Results (from the past 240 hour(s))  Culture, blood (routine x 2)     Status: None   Collection Time: 07/21/21  7:15 PM   Specimen: BLOOD  Result Value Ref Range Status   Specimen Description BLOOD RIGHT ANTECUBITAL  Final   Special Requests   Final    BOTTLES DRAWN AEROBIC ONLY Blood Culture results may not be optimal due to an inadequate volume of blood received in culture bottles   Culture   Final    NO GROWTH 5 DAYS Performed at [facility], [address] [place], [place]  [address]    Report Status 07/26/2021 FINAL  Final  MRSA Next Gen by PCR, Nasal     Status: None   Collection Time: 07/22/21  6:38 PM   Specimen: Nasal Mucosa; Nasal Swab  Result Value Ref Range Status   MRSA by PCR Next Gen NOT DETECTED NOT DETECTED Final    Comment: (NOTE) The GeneXpert MRSA Assay (FDA approved for NASAL specimens only), is one component of a comprehensive MRSA colonization surveillance program. It is not intended to diagnose MRSA infection nor to guide or monitor treatment for MRSA infections. Test performance is not FDA approved in patients less than 41 years old. Performed at [facility], [place] [address], [place], [place] [address]      Radiology Studies: No results found.  Scheduled Meds:  Chlorhexidine Gluconate Cloth  6 each Topical Daily   Chlorhexidine Gluconate Cloth  6 each Topical Q0600   feeding supplement (NEPRO CARB STEADY)  237 mL Oral BID BM   heparin injection (subcutaneous)  5,000 Units Subcutaneous Q8H   neomycin-bacitracin-polymyxin   Topical TID   pantoprazole (PROTONIX) IV  40 mg Intravenous Q24H   sodium chloride flush  10-40 mL Intracatheter Q12H   sodium chloride flush  3  mL Intravenous Q12H   Continuous Infusions:  sodium chloride 10 mL/hr at 07/27/21 0654   anticoagulant sodium citrate       LOS: 7 days   Shelly Coss, MD [facility] P7/14/2023, 12:49 PM

## 2021-07-30 DIAGNOSIS — K436 Other and unspecified ventral hernia with obstruction, without gangrene: Secondary | ICD-10-CM | POA: Diagnosis not present

## 2021-07-30 LAB — RENAL FUNCTION PANEL
Albumin: 1.9 g/dL — ABNORMAL LOW (ref 3.5–5.0)
Albumin: 2.1 g/dL — ABNORMAL LOW (ref 3.5–5.0)
Anion gap: 22 — ABNORMAL HIGH (ref 5–15)
Anion gap: 20 — ABNORMAL HIGH (ref 5–15)
BUN: 77 mg/dL — ABNORMAL HIGH (ref 8–23)
BUN: 48 mg/dL — ABNORMAL HIGH (ref 8–23)
CO2: 20 mmol/L — ABNORMAL LOW (ref 22–32)
CO2: 19 mmol/L — ABNORMAL LOW (ref 22–32)
Calcium: 8.5 mg/dL — ABNORMAL LOW (ref 8.9–10.3)
Calcium: 8.4 mg/dL — ABNORMAL LOW (ref 8.9–10.3)
Chloride: 98 mmol/L (ref 98–111)
Chloride: 98 mmol/L (ref 98–111)
Creatinine, Ser: 8.24 mg/dL — ABNORMAL HIGH (ref 0.44–1.00)
Creatinine, Ser: 5.75 mg/dL — ABNORMAL HIGH (ref 0.44–1.00)
GFR, Estimated: 5 mL/min — ABNORMAL LOW (ref >60)
GFR, Estimated: 8 mL/min — ABNORMAL LOW (ref >60)
Glucose, Bld: 102 mg/dL — ABNORMAL HIGH (ref 70–99)
Glucose, Bld: 88 mg/dL (ref 70–99)
Phosphorus: 8.6 mg/dL — ABNORMAL HIGH (ref 2.5–4.6)
Phosphorus: 6.7 mg/dL — ABNORMAL HIGH (ref 2.5–4.6)
Potassium: 4.2 mmol/L (ref 3.5–5.1)
Potassium: 4.3 mmol/L (ref 3.5–5.1)
Sodium: 140 mmol/L (ref 135–145)
Sodium: 137 mmol/L (ref 135–145)

## 2021-07-30 LAB — CBC
HCT: 30.7 % — ABNORMAL LOW (ref 36.0–46.0)
Hemoglobin: 10.8 g/dL — ABNORMAL LOW (ref 12.0–15.0)
MCH: 31.7 pg (ref 26.0–34.0)
MCHC: 35.2 g/dL (ref 30.0–36.0)
MCV: 90 fL (ref 80.0–100.0)
Platelets: 364 10*3/uL (ref 150–400)
RBC: 3.41 MIL/uL — ABNORMAL LOW (ref 3.87–5.11)
RDW: 18.1 % — ABNORMAL HIGH (ref 11.5–15.5)
WBC: 14.7 10*3/uL — ABNORMAL HIGH (ref 4.0–10.5)
nRBC: 0 % (ref 0.0–0.2)

## 2021-07-30 LAB — GLUCOSE, CAPILLARY
Glucose-Capillary: 107 mg/dL — ABNORMAL HIGH (ref 70–99)
Glucose-Capillary: 116 mg/dL — ABNORMAL HIGH (ref 70–99)
Glucose-Capillary: 45 mg/dL — ABNORMAL LOW (ref 70–99)
Glucose-Capillary: 45 mg/dL — ABNORMAL LOW (ref 70–99)
Glucose-Capillary: 62 mg/dL — ABNORMAL LOW (ref 70–99)
Glucose-Capillary: 69 mg/dL — ABNORMAL LOW (ref 70–99)
Glucose-Capillary: 81 mg/dL (ref 70–99)

## 2021-07-30 LAB — TSH: TSH: 5.359 u[IU]/mL — ABNORMAL HIGH (ref 0.350–4.500)

## 2021-07-30 MED ORDER — ALTEPLASE 2 MG IJ SOLR
INTRAMUSCULAR | Status: AC
  Administered 2021-07-30: 2 mg
  Filled 2021-07-30: qty 2

## 2021-07-30 MED ORDER — CALCIUM POLYCARBOPHIL 625 MG PO TABS
625.0000 mg | ORAL_TABLET | Freq: Two times a day (BID) | ORAL | Status: DC
  Administered 2021-07-30 – 2021-08-16 (×32): 625 mg via ORAL
  Filled 2021-07-30 (×37): qty 1

## 2021-07-30 MED ORDER — PANTOPRAZOLE SODIUM 40 MG PO TBEC
40.0000 mg | DELAYED_RELEASE_TABLET | Freq: Every day | ORAL | Status: DC
  Administered 2021-07-31 – 2021-08-16 (×17): 40 mg via ORAL
  Filled 2021-07-30 (×17): qty 1

## 2021-07-30 NOTE — Progress Notes (Signed)
[facility] Progress Note   Subjective:  Feel ok - says BM and UOP but purewick wasn't hooked up so no I/Os.  HD last PM - UF 1.5L; poor functioning of both lines but used arterial of one and venous of other to run.    Objective Vitals:   07/30/21 0246 07/30/21 0431 07/30/21 0810 07/30/21 0812  BP: (!) 119/91 139/80 (!) 146/96 127/83  Pulse: (!) 117 98 (!) 131 (!) 126  Resp: '20 19 20 20  ' Temp: 97.9 F (36.6 C)  98.6 F (37 C)   TempSrc: Oral  Oral   SpO2: 99% 99% 93% 95%  Weight:      Height:       Physical Exam General:  calmly resting at 30 deg in bed Heart: RRR Lungs: clear ant, no rales Abdomen: soft, midline dressing c/d/I, JP drain in place Extremities: minor edema Dialysis Access:  none,  LIJ temp HD cath, RIJ Temp HD cath  Additional Objective Labs: Basic Metabolic Panel: Recent Labs  Lab 07/27/21 0332 07/29/21 1107 07/30/21 0741  NA 140 140 137  K 4.7 4.2 4.3  CL 103 98 98  CO2 20* 20* 19*  GLUCOSE 82 102* 88  BUN 94* 77* 48*  CREATININE 8.15* 8.24* 5.75*  CALCIUM 7.8* 8.5* 8.4*  PHOS 9.2* 8.6* 6.7*    Liver Function Tests: Recent Labs  Lab 07/27/21 0332 07/29/21 1107 07/30/21 0741  ALBUMIN 1.8* 1.9* 2.1*    No results for input(s): "LIPASE", "AMYLASE" in the last 168 hours.  CBC: Recent Labs  Lab 07/25/21 0343 07/26/21 0556 07/27/21 0332 07/29/21 0650 07/30/21 0741  WBC 13.5* 11.2* 10.4 11.7* 14.7*  NEUTROABS  --  8.9* 7.9* 9.0*  --   HGB 9.2* 8.6* 8.7* 9.2* 10.8*  HCT 26.3* 24.6* 25.3* 27.7* 30.7*  MCV 89.8 90.8 91.0 93.0 90.0  PLT 136* 130* 129* 273 364    Blood Culture    Component Value Date/Time   SDES BLOOD RIGHT ANTECUBITAL 07/21/2021 1915   SPECREQUEST  07/21/2021 1915    BOTTLES DRAWN AEROBIC ONLY Blood Culture results may not be optimal due to an inadequate volume of blood received in culture bottles   CULT  07/21/2021 1915    NO GROWTH 5 DAYS Performed at [facility], [place] [address],  [place], [place] [address]    REPTSTATUS 07/26/2021 FINAL 07/21/2021 1915    Cardiac Enzymes: No results for input(s): "CKTOTAL", "CKMB", "CKMBINDEX", "TROPONINI" in the last 168 hours.  CBG: Recent Labs  Lab 07/28/21 0757 07/28/21 1953 07/28/21 2357 07/29/21 0347 07/29/21 0833  GLUCAP 68* 104* 92 92 76    Iron Studies:  Recent Labs    07/29/21 1107  IRON 23*  TIBC 164*  FERRITIN 162   '@[email or web address]' @ Studies/Results: IR Fluoro Guide CV Line Right  Result Date: 07/29/2021 INDICATION: 66 year old female referred for temporary non tunneled hemodialysis catheter EXAM: IMAGE GUIDED PLACEMENT OF NON TUNNELED HEMODIALYSIS CATHETER MEDICATIONS: None ANESTHESIA/SEDATION: None FLUOROSCOPY: Radiation Exposure Index (as provided by the fluoroscopic device): 12 seconds, 5 mGy Kerma COMPLICATIONS: None PROCEDURE: Informed written consent was obtained from the patient after a discussion of the risks, benefits, and alternatives to treatment. Questions regarding the procedure were encouraged and answered. The right neck was prepped with chlorhexidine in a sterile fashion, and a sterile drape was applied covering the operative field. Maximum barrier sterile technique with sterile gowns and gloves were used for the procedure. A timeout was performed prior to the initiation of the  procedure. A micropuncture kit was utilized to access the right internal jugular vein under direct, real-time ultrasound guidance after the overlying soft tissues were anesthetized with 1% lidocaine with epinephrine. Ultrasound image documentation was performed. The microwire was kinked to measure appropriate catheter length. A stiff wire was advanced to the level of the IVC. A 16 cm hemodialysis catheter was then placed over the wire. Final catheter positioning was confirmed and documented with a spot radiographic image. The catheter aspirates and flushes normally. The catheter was flushed with appropriate volume heparin dwells.  Dressings were applied. The patient tolerated the procedure well without immediate post procedural complication. IMPRESSION: Status post right IJ non tunneled triple-lumen hemodialysis catheter. Signed, Dulcy Fanny. Nadene Rubins, RPVI [facility] Electronically Signed   By: Corrie Mckusick D.O.   On: 07/29/2021 17:08   IR US Guide Vasc Access Right  Result Date: 07/29/2021 INDICATION: 66 year old female referred for temporary non tunneled hemodialysis catheter EXAM: IMAGE GUIDED PLACEMENT OF NON TUNNELED HEMODIALYSIS CATHETER MEDICATIONS: None ANESTHESIA/SEDATION: None FLUOROSCOPY: Radiation Exposure Index (as provided by the fluoroscopic device): 12 seconds, 5 mGy Kerma COMPLICATIONS: None PROCEDURE: Informed written consent was obtained from the patient after a discussion of the risks, benefits, and alternatives to treatment. Questions regarding the procedure were encouraged and answered. The right neck was prepped with chlorhexidine in a sterile fashion, and a sterile drape was applied covering the operative field. Maximum barrier sterile technique with sterile gowns and gloves were used for the procedure. A timeout was performed prior to the initiation of the procedure. A micropuncture kit was utilized to access the right internal jugular vein under direct, real-time ultrasound guidance after the overlying soft tissues were anesthetized with 1% lidocaine with epinephrine. Ultrasound image documentation was performed. The microwire was kinked to measure appropriate catheter length. A stiff wire was advanced to the level of the IVC. A 16 cm hemodialysis catheter was then placed over the wire. Final catheter positioning was confirmed and documented with a spot radiographic image. The catheter aspirates and flushes normally. The catheter was flushed with appropriate volume heparin dwells. Dressings were applied. The patient tolerated the procedure well  without immediate post procedural complication. IMPRESSION: Status post right IJ non tunneled triple-lumen hemodialysis catheter. Signed, Dulcy Fanny. Nadene Rubins, RPVI [facility] Electronically Signed   By: Corrie Mckusick D.O.   On: 07/29/2021 17:08   Medications:  sodium chloride 10 mL/hr at 07/27/21 0654   ferric gluconate (FERRLECIT) IVPB 250 mg (07/29/21 1810)    Chlorhexidine Gluconate Cloth  6 each Topical Daily   Chlorhexidine Gluconate Cloth  6 each Topical Q0600   feeding supplement (NEPRO CARB STEADY)  237 mL Oral BID BM   heparin injection (subcutaneous)  5,000 Units Subcutaneous Q8H   metoprolol tartrate  12.5 mg Oral BID   neomycin-bacitracin-polymyxin   Topical TID   pantoprazole (PROTONIX) IV  40 mg Intravenous Q24H   sodium chloride flush  10-40 mL Intracatheter Q12H   sodium chloride flush  3 mL Intravenous Q12H    Assessment/Plan: 1.  Acute kidney injury chronic kidney disease stage IIIa: Appears consistent with ATN of septic shock.  Initiated HD 7/10 for worsening azotemia/metabolic acidosis. Hopefully will be temporary HD until she has renal recovery.  Had HD 7/14. Report of UOP and now has purewick.    Cont daily labs and daily assessment for need of HD.  LIJ didn't function for HD, had RIJ placed  7/14 IR that also had trouble last night - plan is remove LIJ HD catheter today then can see if RIJ works better; also has tPA dwell.  C/w IV team remove LIJ.    Avoid nephrotoxic medications including NSAIDs and iodinated intravenous contrast exposure unless the latter is absolutely indicated.  Preferred narcotic agents for pain control are hydromorphone, fentanyl, and methadone. Morphine should not be used. Avoid Baclofen and avoid oral sodium phosphate and magnesium citrate based laxatives / bowel preps. Continue strict Input and Output monitoring. Will monitor the patient closely with you and intervene or adjust therapy  as indicated by changes in clinical status/labs.  2.  Incarcerated ventral hernia: Status post exploratory laparotomy with small bowel resection, adhesiolysis and hernia repair with JP drain placement.  Ongoing follow-up by surgery. 3. Hyperphosphatemia: phos 9.2 > 6.7, follow - secondary to AKI: start binder when eating if remains issue. Currently on soft only so no binder and phos improved 4.  Peritonitis: With resolution of septic shock, weaned off of pressors and remains on corticosteroids.  completed course of Zosyn. 5.  Anemia: Possibly chronic from malabsorption given prior history of small bowel resection.  Exacerbated by acute/critical illness and perioperative blood losses.  Hb in the 8-9s currently.  Iron sat 14, ferrritin 162; receiving IV iron load.   Will follow, page with concerns.  Jannifer Hick MD 07/30/2021, 8:58 AM  [facility] Pager: [phone]

## 2021-07-30 NOTE — Progress Notes (Signed)
PROGRESS NOTE  [name]  XID:[id number] DOB: 09-Dec-1955 DOA: 07/21/2021 PCP: Mancel Bale, PA-C   Brief Narrative:  Patient is a 66 year old female with history of hypertension, CKD stage III a,hyperlipidemia, chronically incarcerated large abdominal wall hernia which was repaired multiple times, recent SBO resection with mesh placement who presented to the ER with severe abdominal pain, vomiting.  Lab work showed creatinine of 5.2, lactic acid of 6.5, leukocytosis.  CT abdomen/pelvis showed large SBO, large lower abdominal ventral hernia containing much of the small bowel.  General surgery consulted and she was taken to the OR for emergent exploratory laparotomy and hernia repair,she was transferred to ICU.  Currently hemodynamically stable and patient transferred to [facility] service on 7/13.  On dialysis for persistent AKI, nephrology following.  General surgery following.  PT recommending skilled nursing facility on discharge   Assessment & Plan:  Principal Problem:   Ventral hernia with bowel obstruction Active Problems:   SIRS (systemic inflammatory response syndrome) (HCC)   Lactic acidosis   Leukocytosis   Acute renal failure superimposed on stage 3b chronic kidney disease (HCC)   Transient hypotension   Hypokalemia   History of small bowel obstruction   Body mass index 40.0-44.9, adult ([place])   H/O exploratory laparotomy   Septic shock (HCC)   Ventilator dependence (HCC)   Protein-calorie malnutrition, severe   Incarcerated/strangulated ventral hernia: Status post small bowel resection, lysis of additions, repair of hernia and JP drain placement.  Also found to have peritonitis.  Completed antibiotics course.   General surgery following.  Having bowel movements now.  On soft diet.  Denies any abdominal pain.  Developed mild leukocytosis today, follow-up tomorrow  AKI on CKD stage 3A: Likely from ATN from septic shock secondary to peritonitis.  Initiated hemodialysis on 7/10,  nephrology following.  Temporal dialysis catheter changed.  Normocytic anemia: Likely associated with critical illness, chronic kidney disease.  Hemoglobin stable in the range of 8.  Continue to monitor.  Low iron as per labs,ordered iv iron  Intermittent sinus tachycardia: Unclear etiology.  Echo showed EF of more than 75%, no regional wall motion abnormality, grade 1 diastolic dysfunction, no valvular abnormality.  Continue low-dose metoprolol.  Will check TSH  History of hypertension: Currently normotensive.  On amlodipine at home, currently on hold  Acute hypoxic respiratory failure: Had to be put on ventilator after surgery, currently extubated and on room air  Obesity: BMI of 37.1  Debility/deconditioning: Patient seen by PT and recommended skilled nursing facility on discharge.TOC following   Nutrition Problem: Severe Malnutrition Etiology: chronic illness (chronic incarcerated hernia)    DVT prophylaxis:heparin injection 5,000 Units Start: 07/22/21 2200     Code Status: Full Code  Family Communication: Sister at bedside  Patient status:Inpatient  Patient is from :Home  Anticipated discharge to:SNF  Estimated DC date:after surgical /nephrology clearance   Consultants: General surgery  Procedures: Intubation, exploratory laparotomy  Antimicrobials:  Anti-infectives (From admission, onward)    Start     Dose/Rate Route Frequency Ordered Stop   07/23/21 1730  piperacillin-tazobactam (ZOSYN) IVPB 2.25 g        2.25 g 100 mL/hr over 30 Minutes Intravenous Every 8 hours 07/23/21 1207 07/28/21 1019   07/22/21 0900  piperacillin-tazobactam (ZOSYN) IVPB 2.25 g  Status:  Discontinued        2.25 g 100 mL/hr over 30 Minutes Intravenous Every 8 hours 07/22/21 0833 07/23/21 1207   07/22/21 0830  piperacillin-tazobactam (ZOSYN) IVPB 3.375 g  Status:  Discontinued  3.375 g 100 mL/hr over 30 Minutes Intravenous Every 8 hours 07/22/21 0827 07/22/21 0831   07/22/21  0000  piperacillin-tazobactam (ZOSYN) IVPB 3.375 g        3.375 g 100 mL/hr over 30 Minutes Intravenous  Once 07/21/21 2352 07/22/21 0142       Subjective: Patient seen and examined at the bedside this morning.  Hemodynamically stable without any complaints today.  Lying in bed, appears weak, deconditioned.  We discussed about recommendation for skilled nursing facility and patient and family are agreeable.  Denies any new complaints today.  Objective: Vitals:   07/30/21 0431 07/30/21 0810 07/30/21 0812 07/30/21 1140  BP: 139/80 (!) 146/96 127/83 133/74  Pulse: 98 (!) 131 (!) 126 98  Resp: '19 20 20 16  ' Temp:  98.6 F (37 C)  (!) 97.5 F (36.4 C)  TempSrc:  Oral  Axillary  SpO2: 99% 93% 95% 98%  Weight:      Height:        Intake/Output Summary (Last 24 hours) at 07/30/2021 1145 Last data filed at 07/30/2021 0700 Gross per 24 hour  Intake 270 ml  Output 1510 ml  Net -1240 ml   Filed Weights   07/27/21 1028 07/27/21 1304 07/29/21 1035  Weight: 90.7 kg 89.2 kg 86.9 kg    Examination:  General exam: Overall comfortable, not in distress, looks deconditioned, weak HEENT: PERRL, temporary dialysis catheter on the left neck Respiratory system:  no wheezes or crackles  Cardiovascular system: S1 & S2 heard, RRR.  Gastrointestinal system: Abdomen is nondistended, soft and nontender.  Midline surgical wound with staples, JP drain on the left lower quadrant Central nervous system: Alert and oriented Extremities: No edema, no clubbing ,no cyanosis Skin: No rashes, no ulcers,no icterus     Data Reviewed: I have personally reviewed following labs and imaging studies  CBC: Recent Labs  Lab 07/25/21 0343 07/26/21 0556 07/27/21 0332 07/29/21 0650 07/30/21 0741  WBC 13.5* 11.2* 10.4 11.7* 14.7*  NEUTROABS  --  8.9* 7.9* 9.0*  --   HGB 9.2* 8.6* 8.7* 9.2* 10.8*  HCT 26.3* 24.6* 25.3* 27.7* 30.7*  MCV 89.8 90.8 91.0 93.0 90.0  PLT 136* 130* 129* 273 629   Basic Metabolic  Panel: Recent Labs  Lab 07/25/21 0343 07/26/21 0556 07/27/21 0332 07/29/21 1107 07/30/21 0741  NA 142 141 140 140 137  K 4.7 4.1 4.7 4.2 4.3  CL 107 102 103 98 98  CO2 14* 20* 20* 20* 19*  GLUCOSE 97 90 82 102* 88  BUN 108* 78* 94* 77* 48*  CREATININE 8.49* 6.77* 8.15* 8.24* 5.75*  CALCIUM 6.8* 7.4* 7.8* 8.5* 8.4*  MG  --  1.9  --   --   --   PHOS  --  6.7* 9.2* 8.6* 6.7*     Recent Results (from the past 240 hour(s))  Culture, blood (routine x 2)     Status: None   Collection Time: 07/21/21  7:15 PM   Specimen: BLOOD  Result Value Ref Range Status   Specimen Description BLOOD RIGHT ANTECUBITAL  Final   Special Requests   Final    BOTTLES DRAWN AEROBIC ONLY Blood Culture results may not be optimal due to an inadequate volume of blood received in culture bottles   Culture   Final    NO GROWTH 5 DAYS Performed at [facility], [address], [place], [place] [address]    Report Status 07/26/2021 FINAL  Final  MRSA Next Gen  by PCR, Nasal     Status: None   Collection Time: 07/22/21  6:38 PM   Specimen: Nasal Mucosa; Nasal Swab  Result Value Ref Range Status   MRSA by PCR Next Gen NOT DETECTED NOT DETECTED Final    Comment: (NOTE) The GeneXpert MRSA Assay (FDA approved for NASAL specimens only), is one component of a comprehensive MRSA colonization surveillance program. It is not intended to diagnose MRSA infection nor to guide or monitor treatment for MRSA infections. Test performance is not FDA approved in patients less than 68 years old. Performed at [facility], [place] [address], [place], [place] [address]      Radiology Studies: IR Fluoro Guide CV Line Right  Result Date: 07/29/2021 INDICATION: 66 year old female referred for temporary non tunneled hemodialysis catheter EXAM: IMAGE GUIDED PLACEMENT OF NON TUNNELED HEMODIALYSIS CATHETER MEDICATIONS: None ANESTHESIA/SEDATION: None FLUOROSCOPY: Radiation Exposure Index (as provided by the fluoroscopic  device): 12 seconds, 5 mGy Kerma COMPLICATIONS: None PROCEDURE: Informed written consent was obtained from the patient after a discussion of the risks, benefits, and alternatives to treatment. Questions regarding the procedure were encouraged and answered. The right neck was prepped with chlorhexidine in a sterile fashion, and a sterile drape was applied covering the operative field. Maximum barrier sterile technique with sterile gowns and gloves were used for the procedure. A timeout was performed prior to the initiation of the procedure. A micropuncture kit was utilized to access the right internal jugular vein under direct, real-time ultrasound guidance after the overlying soft tissues were anesthetized with 1% lidocaine with epinephrine. Ultrasound image documentation was performed. The microwire was kinked to measure appropriate catheter length. A stiff wire was advanced to the level of the IVC. A 16 cm hemodialysis catheter was then placed over the wire. Final catheter positioning was confirmed and documented with a spot radiographic image. The catheter aspirates and flushes normally. The catheter was flushed with appropriate volume heparin dwells. Dressings were applied. The patient tolerated the procedure well without immediate post procedural complication. IMPRESSION: Status post right IJ non tunneled triple-lumen hemodialysis catheter. Signed, Dulcy Fanny. Nadene Rubins, RPVI [facility] Electronically Signed   By: Corrie Mckusick D.O.   On: 07/29/2021 17:08   IR US Guide Vasc Access Right  Result Date: 07/29/2021 INDICATION: 66 year old female referred for temporary non tunneled hemodialysis catheter EXAM: IMAGE GUIDED PLACEMENT OF NON TUNNELED HEMODIALYSIS CATHETER MEDICATIONS: None ANESTHESIA/SEDATION: None FLUOROSCOPY: Radiation Exposure Index (as provided by the fluoroscopic device): 12 seconds, 5 mGy Kerma COMPLICATIONS: None PROCEDURE:  Informed written consent was obtained from the patient after a discussion of the risks, benefits, and alternatives to treatment. Questions regarding the procedure were encouraged and answered. The right neck was prepped with chlorhexidine in a sterile fashion, and a sterile drape was applied covering the operative field. Maximum barrier sterile technique with sterile gowns and gloves were used for the procedure. A timeout was performed prior to the initiation of the procedure. A micropuncture kit was utilized to access the right internal jugular vein under direct, real-time ultrasound guidance after the overlying soft tissues were anesthetized with 1% lidocaine with epinephrine. Ultrasound image documentation was performed. The microwire was kinked to measure appropriate catheter length. A stiff wire was advanced to the level of the IVC. A 16 cm hemodialysis catheter was then placed over the wire. Final catheter positioning was confirmed and documented with a spot radiographic image. The catheter aspirates and flushes normally. The catheter was flushed  with appropriate volume heparin dwells. Dressings were applied. The patient tolerated the procedure well without immediate post procedural complication. IMPRESSION: Status post right IJ non tunneled triple-lumen hemodialysis catheter. Signed, Dulcy Fanny. Nadene Rubins, RPVI [facility]iology Electronically Signed   By: Corrie Mckusick D.O.   On: 07/29/2021 17:08    Scheduled Meds:  Chlorhexidine Gluconate Cloth  6 each Topical Daily   Chlorhexidine Gluconate Cloth  6 each Topical Q0600   feeding supplement (NEPRO CARB STEADY)  237 mL Oral BID BM   heparin injection (subcutaneous)  5,000 Units Subcutaneous Q8H   metoprolol tartrate  12.5 mg Oral BID   neomycin-bacitracin-polymyxin   Topical TID   pantoprazole (PROTONIX) IV  40 mg Intravenous Q24H   polycarbophil  625 mg Oral BID   sodium chloride flush   10-40 mL Intracatheter Q12H   sodium chloride flush  3 mL Intravenous Q12H   Continuous Infusions:  sodium chloride 10 mL/hr at 07/27/21 0654     LOS: 8 days   Shelly Coss, MD [facility] P7/15/2023, 11:45 AM

## 2021-07-30 NOTE — Progress Notes (Signed)
Received patient in bed, alert and oriented. Informed consent signed and in chart.  Time tx initiated: 2230 Time tx completed: 0152  Patient had HD catheter placement on right IJ on 07/29/21. Placement verified by x-ray per Dr. Earleen Newport @1708 .   Venous line flushed without problems while I was unable to aspirate arterial line. Line was flushed 3x, flow was slow with some resistance. Lines were reversed and HD treatment started. Machine alarming high venous pressure (machine stopped, unable to run).  Accessed patient's temporary HD catheter on Left IJ. Venous lumen collapsed upon aspiration. I was able to flush arterial lumen without problems.  Arterial line was connected to RIJ and venous line was connected to LIJ. Flow was better and treatment resumed. On call nephrologist Dr. Johnney Ou was notified and ordered to do activase dwell overnight on RIJ.   Patient was not able to tolerate treatment. An hour after treatment initiated, patient's HR was elevated, ranging from 110's to 130's, UF goal was reduced and BFR was decreased to 250. Patient verbalized she feels alright. Patient's SBP dropped to the 90's, UF turned off and patient was given 170ml NSS bolus and was given O2 @2LPM  via [place]. BP got better up to low 100's. UF turned back on and goal was reduced.  HD treatment completed. HD catheter site without signs and symptoms of complications. Patient transported back to the room, alert and orient and in no acute distress. Report given to bedside RN.  Total UF removed: 1.5L  Activase dwell was administered on right IJ HD catheter overnight as ordered. Labeled not to flush. Heparin lock administered on left IJ.  Post HD VS: T96.7-126/94-125-22-99% RA  Post HD weight: unable to weigh, bed doesn't have a scale.

## 2021-07-30 NOTE — NC FL2 (Signed)
[place] MEDICAID FL2 LEVEL OF CARE SCREENING TOOL     IDENTIFICATION  Patient Name: [name] Birthdate: 05-09-1955 Sex: female Admission Date (Current Location): 07/21/2021  [facility] and [place] Number:  Herbalist and Address:  [facility], [place] [address] [place], [place] [address]      Provider Number: [id number]  Attending Physician Name and Address:  Shelly Coss, MD  Relative Name and Phone Number:       Current Level of Care: Hospital Recommended Level of Care: [place] Prior Approval Number:    Date Approved/Denied:   PASRR Number: [id number]  Discharge Plan: SNF    Current Diagnoses: Patient Active Problem List   Diagnosis Date Noted   Protein-calorie malnutrition, severe 07/29/2021   Ventral hernia with bowel obstruction 07/22/2021   History of small bowel obstruction 07/22/2021   SIRS (systemic inflammatory response syndrome) ([place]) 07/22/2021   Lactic acidosis 07/22/2021   Transient hypotension 07/22/2021   Acute renal failure superimposed on stage 3b chronic kidney disease ([place]) 07/22/2021   Hypokalemia 07/22/2021   Leukocytosis 07/22/2021   H/O exploratory laparotomy 07/22/2021   Septic shock ([place])    Ventilator dependence ([place])    Body mass index 40.0-44.9, adult ([place]) 07/03/2017   Nontraumatic complete tear of left rotator cuff 07/03/2017   Chronic left shoulder pain 06/05/2017   Essential hypertension 05/06/2014   Hyperlipidemia 05/06/2014   Strangulated recurrent ventral incisional hernia, s/p small intestine resection 09/22/2010   Edema of lower extremities 09/22/2010    Orientation RESPIRATION BLADDER Height & Weight     Self, Time, Situation, Place  Normal External catheter, Incontinent Weight: 191 lb 9.3 oz (86.9 kg) Height:  5\' 1"  (154.9 cm)  BEHAVIORAL SYMPTOMS/MOOD NEUROLOGICAL BOWEL NUTRITION STATUS      Continent Diet (please see discharge summary)  AMBULATORY STATUS  COMMUNICATION OF NEEDS Skin   Limited Assist Verbally Surgical wounds (closed incision abdomen,wound incision, skin tear chest RT, wound incision irritant dermatitis buttocks,right,posterior blister, wound incision skin tear abdomen, wound incision skin tear abdomen)                       Personal Care Assistance Level of Assistance  Bathing, Feeding, Dressing Bathing Assistance: Limited assistance Feeding assistance: Independent Dressing Assistance: Limited assistance     Functional Limitations Info  Sight, Hearing, Speech Sight Info: Adequate Hearing Info: Adequate Speech Info: Adequate    SPECIAL CARE FACTORS FREQUENCY  PT (By licensed PT), OT (By licensed OT)     PT Frequency: 5x per week OT Frequency: 5x per week            Contractures Contractures Info: Not present    Additional Factors Info  Code Status, Allergies Code Status Info: FULL Allergies Info: Lisinopril           Current Medications (07/30/2021):  This is the current hospital active medication list Current Facility-Administered Medications  Medication Dose Route Frequency Provider Last Rate Last Admin   0.9 %  sodium chloride infusion   Intravenous Continuous Chesley Mires, MD 10 mL/hr at 07/27/21 0654 Infusion Verify at 07/27/21 0654   acetaminophen (TYLENOL) tablet 650 mg  650 mg Oral Q6H PRN Norm Parcel, PA-C       albuterol (PROVENTIL) (2.5 MG/3ML) 0.083% nebulizer solution 2.5 mg  2.5 mg Nebulization Q6H PRN Norm Parcel, PA-C       Chlorhexidine Gluconate Cloth 2 % PADS 6 each  6 each Topical  Daily Chesley Mires, MD   6 each at 07/30/21 0946   Chlorhexidine Gluconate Cloth 2 % PADS 6 each  6 each Topical Q0600 Justin Mend, MD   6 each at 07/28/21 0515   dextrose 50 % solution 12.5 g  12.5 g Intravenous Q15 min PRN Chesley Mires, MD       feeding supplement (NEPRO CARB STEADY) liquid 237 mL  237 mL Oral BID BM Adhikari, Amrit, MD   237 mL at 07/30/21 0946   fentaNYL (SUBLIMAZE)  injection 25 mcg  25 mcg Intravenous Q3H PRN Jacky Kindle, MD   25 mcg at 07/27/21 1254   ferric gluconate (FERRLECIT) 250 mg in sodium chloride 0.9 % 250 mL IVPB  250 mg Intravenous Daily Shelly Coss, MD 135 mL/hr at 07/30/21 0945 250 mg at 07/30/21 0945   heparin injection 1,000-6,000 Units  1,000-6,000 Units Dialysis PRN Jacky Kindle, MD   1,000 Units at 07/27/21 1304   heparin injection 5,000 Units  5,000 Units Subcutaneous Q8H Norm Parcel, PA-C   5,000 Units at 07/29/21 0263   lidocaine (PF) (XYLOCAINE) 1 % injection    PRN Corrie Mckusick, DO   8 mL at 07/29/21 1535   lip balm (CARMEX) ointment   Topical PRN Jacky Kindle, MD       metoprolol tartrate (LOPRESSOR) injection 2.5 mg  2.5 mg Intravenous Q6H PRN Irene Pap N, DO   2.5 mg at 07/30/21 0321   metoprolol tartrate (LOPRESSOR) tablet 12.5 mg  12.5 mg Oral BID Irene Pap N, DO   12.5 mg at 07/29/21 2045   neomycin-bacitracin-polymyxin (NEOSPORIN) ointment   Topical TID Idamae Schuller, MD   Given at 07/30/21 0946   ondansetron (ZOFRAN) injection 4 mg  4 mg Intravenous Q6H PRN Norm Parcel, PA-C       Oral care mouth rinse  15 mL Mouth Rinse PRN Jacky Kindle, MD       pantoprazole (PROTONIX) injection 40 mg  40 mg Intravenous Q24H Jacky Kindle, MD   40 mg at 07/30/21 0948   sodium chloride flush (NS) 0.9 % injection 10-40 mL  10-40 mL Intracatheter Q12H Sood, Elisabeth Cara, MD   10 mL at 07/29/21 1005   sodium chloride flush (NS) 0.9 % injection 10-40 mL  10-40 mL Intracatheter PRN Chesley Mires, MD       sodium chloride flush (NS) 0.9 % injection 3 mL  3 mL Intravenous Q12H Barkley Boards R, PA-C   3 mL at 07/28/21 1019   traMADol (ULTRAM) tablet 50 mg  50 mg Oral Q6H PRN Norm Parcel, PA-C   50 mg at 07/29/21 2009     Discharge Medications: Please see discharge summary for a list of discharge medications.  Relevant Imaging Results:  Relevant Lab Results:   Additional Information SSN [id number]  Vinie Sill, LCSW

## 2021-07-30 NOTE — TOC Progression Note (Addendum)
Transition of Care [facility]) - Progression Note    Patient Details  Name: [name] MRN: [id number] Date of Birth: [birth year]  Transition of Care [facility]) CM/SW [place], [place] Phone Number: 07/30/2021, 10:14 AM  Clinical Narrative:     CSW met with patient and her sister this morning. Patient confirmed she is now interested in short term rehab at [facility]. Patient states she wants to move forward with SNF search so she can have placement as an option. Preferred SNF is [facility] or SNF close in the area.   TOC will provide bed offers once available TOC will continue to follow and assist with discharge planning.   Thurmond Butts, MSW, LCSW Clinical Social Worker    Expected Discharge Plan: [place] Barriers to Discharge: Continued Medical Work up  Expected Discharge Plan and Services Expected Discharge Plan: [place] arrangements for the past 2 months: Single Family Home                                       Social Determinants of Health (SDOH) Interventions    Readmission Risk Interventions     No data to display

## 2021-07-30 NOTE — Progress Notes (Signed)
Patient ID: [name], female   DOB: 1955/03/31, 66 y.o.   MRN: [id number] [facility] Surgery Progress Note  8 Days Post-Op  Subjective: Tolerating soft diet, still having loose stools.   Objective: Vital signs in last 24 hours: Temp:  [96.7 F (35.9 C)-98.6 F (37 C)] 98.6 F (37 C) (07/15 0810) Pulse Rate:  [50-146] 126 (07/15 0812) Resp:  [14-24] 20 (07/15 0812) BP: (86-154)/(50-96) 127/83 (07/15 0812) SpO2:  [93 %-100 %] 95 % (07/15 0812) Last BM Date : 07/29/21  Intake/Output from previous day: 07/14 0701 - 07/15 0700 In: 270 [IV Piggyback:270] Out: 1510 [Drains:10] Intake/Output this shift: No intake/output data recorded.  PE: Abd: soft, mild diffuse tenderness without rebound or guarding, no peritonitis, JP serosanguinous with scant drainage. Incision C/D/I with staples present.  Lab Results:  Recent Labs    07/29/21 0650 07/30/21 0741  WBC 11.7* 14.7*  HGB 9.2* 10.8*  HCT 27.7* 30.7*  PLT 273 364    BMET Recent Labs    07/29/21 1107 07/30/21 0741  NA 140 137  K 4.2 4.3  CL 98 98  CO2 20* 19*  GLUCOSE 102* 88  BUN 77* 48*  CREATININE 8.24* 5.75*  CALCIUM 8.5* 8.4*    PT/INR No results for input(s): "LABPROT", "INR" in the last 72 hours.  CMP     Component Value Date/Time   NA 137 07/30/2021 0741   NA 146 (H) 07/11/2017 1547   K 4.3 07/30/2021 0741   CL 98 07/30/2021 0741   CO2 19 (L) 07/30/2021 0741   GLUCOSE 88 07/30/2021 0741   BUN 48 (H) 07/30/2021 0741   BUN 14 07/11/2017 1547   CREATININE 5.75 (H) 07/30/2021 0741   CREATININE 1.04 05/06/2014 1538   CALCIUM 8.4 (L) 07/30/2021 0741   PROT 5.0 (L) 07/23/2021 0319   PROT 6.1 07/11/2017 1547   ALBUMIN 2.1 (L) 07/30/2021 0741   ALBUMIN 3.5 (L) 07/11/2017 1547   AST 36 07/23/2021 0319   ALT 34 07/23/2021 0319   ALKPHOS 36 (L) 07/23/2021 0319   BILITOT 1.0 07/23/2021 0319   BILITOT <0.2 07/11/2017 1547   GFRNONAA 8 (L) 07/30/2021 0741   GFRAA 50 (L) 09/13/2019 0755    Lipase     Component Value Date/Time   LIPASE 24 07/21/2021 2056       Studies/Results: IR Fluoro Guide CV Line Right  Result Date: 07/29/2021 INDICATION: 66 year old female referred for temporary non tunneled hemodialysis catheter EXAM: IMAGE GUIDED PLACEMENT OF NON TUNNELED HEMODIALYSIS CATHETER MEDICATIONS: None ANESTHESIA/SEDATION: None FLUOROSCOPY: Radiation Exposure Index (as provided by the fluoroscopic device): 12 seconds, 5 mGy Kerma COMPLICATIONS: None PROCEDURE: Informed written consent was obtained from the patient after a discussion of the risks, benefits, and alternatives to treatment. Questions regarding the procedure were encouraged and answered. The right neck was prepped with chlorhexidine in a sterile fashion, and a sterile drape was applied covering the operative field. Maximum barrier sterile technique with sterile gowns and gloves were used for the procedure. A timeout was performed prior to the initiation of the procedure. A micropuncture kit was utilized to access the right internal jugular vein under direct, real-time ultrasound guidance after the overlying soft tissues were anesthetized with 1% lidocaine with epinephrine. Ultrasound image documentation was performed. The microwire was kinked to measure appropriate catheter length. A stiff wire was advanced to the level of the IVC. A 16 cm hemodialysis catheter was then placed over the wire. Final catheter positioning was confirmed and documented with a spot  radiographic image. The catheter aspirates and flushes normally. The catheter was flushed with appropriate volume heparin dwells. Dressings were applied. The patient tolerated the procedure well without immediate post procedural complication. IMPRESSION: Status post right IJ non tunneled triple-lumen hemodialysis catheter. Signed, Dulcy Fanny. Nadene Rubins, RPVI Vascular and Interventional Radiology Specialists [facility] Radiology Electronically Signed   By: Corrie Mckusick D.O.   On: 07/29/2021 17:08   IR US Guide Vasc Access Right  Result Date: 07/29/2021 INDICATION: 66 year old female referred for temporary non tunneled hemodialysis catheter EXAM: IMAGE GUIDED PLACEMENT OF NON TUNNELED HEMODIALYSIS CATHETER MEDICATIONS: None ANESTHESIA/SEDATION: None FLUOROSCOPY: Radiation Exposure Index (as provided by the fluoroscopic device): 12 seconds, 5 mGy Kerma COMPLICATIONS: None PROCEDURE: Informed written consent was obtained from the patient after a discussion of the risks, benefits, and alternatives to treatment. Questions regarding the procedure were encouraged and answered. The right neck was prepped with chlorhexidine in a sterile fashion, and a sterile drape was applied covering the operative field. Maximum barrier sterile technique with sterile gowns and gloves were used for the procedure. A timeout was performed prior to the initiation of the procedure. A micropuncture kit was utilized to access the right internal jugular vein under direct, real-time ultrasound guidance after the overlying soft tissues were anesthetized with 1% lidocaine with epinephrine. Ultrasound image documentation was performed. The microwire was kinked to measure appropriate catheter length. A stiff wire was advanced to the level of the IVC. A 16 cm hemodialysis catheter was then placed over the wire. Final catheter positioning was confirmed and documented with a spot radiographic image. The catheter aspirates and flushes normally. The catheter was flushed with appropriate volume heparin dwells. Dressings were applied. The patient tolerated the procedure well without immediate post procedural complication. IMPRESSION: Status post right IJ non tunneled triple-lumen hemodialysis catheter. Signed, Dulcy Fanny. Nadene Rubins, RPVI Vascular and Interventional Radiology Specialists [facility] Radiology Electronically Signed   By: Corrie Mckusick D.O.   On: 07/29/2021 17:08     Anti-infectives: Anti-infectives (From admission, onward)    Start     Dose/Rate Route Frequency Ordered Stop   07/23/21 1730  piperacillin-tazobactam (ZOSYN) IVPB 2.25 g        2.25 g 100 mL/hr over 30 Minutes Intravenous Every 8 hours 07/23/21 1207 07/28/21 1019   07/22/21 0900  piperacillin-tazobactam (ZOSYN) IVPB 2.25 g  Status:  Discontinued        2.25 g 100 mL/hr over 30 Minutes Intravenous Every 8 hours 07/22/21 0833 07/23/21 1207   07/22/21 0830  piperacillin-tazobactam (ZOSYN) IVPB 3.375 g  Status:  Discontinued        3.375 g 100 mL/hr over 30 Minutes Intravenous Every 8 hours 07/22/21 0827 07/22/21 0831   07/22/21 0000  piperacillin-tazobactam (ZOSYN) IVPB 3.375 g        3.375 g 100 mL/hr over 30 Minutes Intravenous  Once 07/21/21 2352 07/22/21 0142        Assessment/Plan POD#8 s/p  exploratory laparotomy, small bowel resection, adhesiolysis x90 minutes, primary repair of incarcerated ventral hernia, JP drain placement 7/7 Dr. Bobbye Morton for incarcerated and strangulated ventral hernia - monitor JP, serosanguinous - soft diet, add bid fiber today  - ok to add protein supplement if ok with renal  - completed 5 days post-op abx, WBC up to 14.7, afebrile - will repeat CBC again tomorrow but would start with CXR and UA  - encourage mobilization   ID - zosyn 7/7>7/12 FEN - soft diet VTE - SQH Foley -  not present  - below per TRH -   Septic shock - off pressors 7/9 ARF - HD started 7/10 VDRF - extubated 7/11 Elevated troponin - cards has seen, suspect secondary to critical illness and not ACS HTN Anemia - hgb stable     LOS: 8 days    Norm Parcel, [facility] Surgery 07/30/2021, 11:18 AM Please see Amion for pager number during day hours 7:00am-4:30pm

## 2021-07-31 DIAGNOSIS — K436 Other and unspecified ventral hernia with obstruction, without gangrene: Secondary | ICD-10-CM | POA: Diagnosis not present

## 2021-07-31 LAB — CBC
HCT: 30.7 % — ABNORMAL LOW (ref 36.0–46.0)
Hemoglobin: 10.5 g/dL — ABNORMAL LOW (ref 12.0–15.0)
MCH: 31.3 pg (ref 26.0–34.0)
MCHC: 34.2 g/dL (ref 30.0–36.0)
MCV: 91.6 fL (ref 80.0–100.0)
Platelets: 391 10*3/uL (ref 150–400)
RBC: 3.35 MIL/uL — ABNORMAL LOW (ref 3.87–5.11)
RDW: 18.7 % — ABNORMAL HIGH (ref 11.5–15.5)
WBC: 12.5 10*3/uL — ABNORMAL HIGH (ref 4.0–10.5)
nRBC: 0 % (ref 0.0–0.2)

## 2021-07-31 LAB — GLUCOSE, CAPILLARY
Glucose-Capillary: 64 mg/dL — ABNORMAL LOW (ref 70–99)
Glucose-Capillary: 79 mg/dL (ref 70–99)
Glucose-Capillary: 75 mg/dL (ref 70–99)
Glucose-Capillary: 69 mg/dL — ABNORMAL LOW (ref 70–99)

## 2021-07-31 MED ORDER — LOPERAMIDE HCL 2 MG PO CAPS: 2.0000 mg | ORAL_CAPSULE | ORAL | Status: DC | PRN

## 2021-07-31 MED ORDER — METOPROLOL TARTRATE 25 MG PO TABS
25.0000 mg | ORAL_TABLET | Freq: Two times a day (BID) | ORAL | Status: DC
  Administered 2021-07-31 – 2021-08-16 (×32): 25 mg via ORAL
  Filled 2021-07-31 (×31): qty 1

## 2021-07-31 NOTE — Progress Notes (Signed)
Patient ID: [name], female   DOB: Jan 30, 1955, 66 y.o.   MRN: [id number] [facility] Surgery Progress Note  9 Days Post-Op  Subjective: Tolerating soft diet, still having loose stools but maybe slightly improved.    Objective: Vital signs in last 24 hours: Temp:  [97.4 F (36.3 C)-97.8 F (36.6 C)] 97.8 F (36.6 C) (07/16 0733) Pulse Rate:  [78-115] 105 (07/16 0733) Resp:  [14-17] 14 (07/16 0733) BP: (104-133)/(66-81) 126/81 (07/16 0733) SpO2:  [96 %-100 %] 100 % (07/16 0733) Last BM Date : 07/30/21  Intake/Output from previous day: 07/15 0701 - 07/16 0700 In: 236 [P.O.:236] Out: 15 [Drains:15] Intake/Output this shift: No intake/output data recorded.  PE: Abd: soft, mild diffuse tenderness without rebound or guarding, no peritonitis, JP serosanguinous with scant drainage (removed at bedside without complication). Incision C/D/I with staples present.  Lab Results:  Recent Labs    07/30/21 0741 07/31/21 0229  WBC 14.7* 12.5*  HGB 10.8* 10.5*  HCT 30.7* 30.7*  PLT 364 391    BMET Recent Labs    07/30/21 0741 07/31/21 0229  NA 137 140  K 4.3 4.4  CL 98 96*  CO2 19* 19*  GLUCOSE 88 82  BUN 48* 55*  CREATININE 5.75* 6.57*  CALCIUM 8.4* 8.5*    PT/INR No results for input(s): "LABPROT", "INR" in the last 72 hours.  CMP     Component Value Date/Time   NA 140 07/31/2021 0229   NA 146 (H) 07/11/2017 1547   K 4.4 07/31/2021 0229   CL 96 (L) 07/31/2021 0229   CO2 19 (L) 07/31/2021 0229   GLUCOSE 82 07/31/2021 0229   BUN 55 (H) 07/31/2021 0229   BUN 14 07/11/2017 1547   CREATININE 6.57 (H) 07/31/2021 0229   CREATININE 1.04 05/06/2014 1538   CALCIUM 8.5 (L) 07/31/2021 0229   PROT 5.0 (L) 07/23/2021 0319   PROT 6.1 07/11/2017 1547   ALBUMIN 2.0 (L) 07/31/2021 0229   ALBUMIN 3.5 (L) 07/11/2017 1547   AST 36 07/23/2021 0319   ALT 34 07/23/2021 0319   ALKPHOS 36 (L) 07/23/2021 0319   BILITOT 1.0 07/23/2021 0319   BILITOT <0.2 07/11/2017 1547    GFRNONAA 6 (L) 07/31/2021 0229   GFRAA 50 (L) 09/13/2019 0755   Lipase     Component Value Date/Time   LIPASE 24 07/21/2021 2056       Studies/Results: IR Fluoro Guide CV Line Right  Result Date: 07/29/2021 INDICATION: 65 year old female referred for temporary non tunneled hemodialysis catheter EXAM: IMAGE GUIDED PLACEMENT OF NON TUNNELED HEMODIALYSIS CATHETER MEDICATIONS: None ANESTHESIA/SEDATION: None FLUOROSCOPY: Radiation Exposure Index (as provided by the fluoroscopic device): 12 seconds, 5 mGy Kerma COMPLICATIONS: None PROCEDURE: Informed written consent was obtained from the patient after a discussion of the risks, benefits, and alternatives to treatment. Questions regarding the procedure were encouraged and answered. The right neck was prepped with chlorhexidine in a sterile fashion, and a sterile drape was applied covering the operative field. Maximum barrier sterile technique with sterile gowns and gloves were used for the procedure. A timeout was performed prior to the initiation of the procedure. A micropuncture kit was utilized to access the right internal jugular vein under direct, real-time ultrasound guidance after the overlying soft tissues were anesthetized with 1% lidocaine with epinephrine. Ultrasound image documentation was performed. The microwire was kinked to measure appropriate catheter length. A stiff wire was advanced to the level of the IVC. A 16 cm hemodialysis catheter was then placed over the wire.  Final catheter positioning was confirmed and documented with a spot radiographic image. The catheter aspirates and flushes normally. The catheter was flushed with appropriate volume heparin dwells. Dressings were applied. The patient tolerated the procedure well without immediate post procedural complication. IMPRESSION: Status post right IJ non tunneled triple-lumen hemodialysis catheter. Signed, Dulcy Fanny. Nadene Rubins, RPVI Vascular and Interventional Radiology  Specialists [facility] Radiology Electronically Signed   By: Corrie Mckusick D.O.   On: 07/29/2021 17:08   IR US Guide Vasc Access Right  Result Date: 07/29/2021 INDICATION: 65 year old female referred for temporary non tunneled hemodialysis catheter EXAM: IMAGE GUIDED PLACEMENT OF NON TUNNELED HEMODIALYSIS CATHETER MEDICATIONS: None ANESTHESIA/SEDATION: None FLUOROSCOPY: Radiation Exposure Index (as provided by the fluoroscopic device): 12 seconds, 5 mGy Kerma COMPLICATIONS: None PROCEDURE: Informed written consent was obtained from the patient after a discussion of the risks, benefits, and alternatives to treatment. Questions regarding the procedure were encouraged and answered. The right neck was prepped with chlorhexidine in a sterile fashion, and a sterile drape was applied covering the operative field. Maximum barrier sterile technique with sterile gowns and gloves were used for the procedure. A timeout was performed prior to the initiation of the procedure. A micropuncture kit was utilized to access the right internal jugular vein under direct, real-time ultrasound guidance after the overlying soft tissues were anesthetized with 1% lidocaine with epinephrine. Ultrasound image documentation was performed. The microwire was kinked to measure appropriate catheter length. A stiff wire was advanced to the level of the IVC. A 16 cm hemodialysis catheter was then placed over the wire. Final catheter positioning was confirmed and documented with a spot radiographic image. The catheter aspirates and flushes normally. The catheter was flushed with appropriate volume heparin dwells. Dressings were applied. The patient tolerated the procedure well without immediate post procedural complication. IMPRESSION: Status post right IJ non tunneled triple-lumen hemodialysis catheter. Signed, Dulcy Fanny. Nadene Rubins, RPVI Vascular and Interventional Radiology Specialists [facility] Radiology Electronically Signed   By: Corrie Mckusick D.O.   On: 07/29/2021 17:08    Anti-infectives: Anti-infectives (From admission, onward)    Start     Dose/Rate Route Frequency Ordered Stop   07/23/21 1730  piperacillin-tazobactam (ZOSYN) IVPB 2.25 g        2.25 g 100 mL/hr over 30 Minutes Intravenous Every 8 hours 07/23/21 1207 07/28/21 1019   07/22/21 0900  piperacillin-tazobactam (ZOSYN) IVPB 2.25 g  Status:  Discontinued        2.25 g 100 mL/hr over 30 Minutes Intravenous Every 8 hours 07/22/21 0833 07/23/21 1207   07/22/21 0830  piperacillin-tazobactam (ZOSYN) IVPB 3.375 g  Status:  Discontinued        3.375 g 100 mL/hr over 30 Minutes Intravenous Every 8 hours 07/22/21 0827 07/22/21 0831   07/22/21 0000  piperacillin-tazobactam (ZOSYN) IVPB 3.375 g        3.375 g 100 mL/hr over 30 Minutes Intravenous  Once 07/21/21 2352 07/22/21 0142        Assessment/Plan POD#9 s/p  exploratory laparotomy, small bowel resection, adhesiolysis x90 minutes, primary repair of incarcerated ventral hernia, JP drain placement 7/7 Dr. Bobbye Morton for incarcerated and strangulated ventral hernia - drain removed at bedside today  - soft diet, bid fiber today, consider adding low dose imodium if continuing to have large volume loose stools   - ok to add protein supplement if ok with renal  - completed 5 days post-op abx, WBC up to 12 from 14, afebrile  - encourage mobilization  ID - zosyn 7/7>7/12 FEN - soft diet VTE - SQH Foley - not present  - below per TRH -   Septic shock - off pressors 7/9 ARF - HD started 7/10 VDRF - extubated 7/11 Elevated troponin - cards has seen, suspect secondary to critical illness and not ACS HTN Anemia - hgb stable     LOS: 9 days    Norm Parcel, [facility] Surgery 07/31/2021, 9:46 AM Please see Amion for pager number during day hours 7:00am-4:30pm

## 2021-07-31 NOTE — Progress Notes (Addendum)
[facility] Progress Note   Subjective:  Feel ok - says BM and UOP but purewick wasn't hooked up so no I/Os.  Eating some.  Mostly soft bland diet.    Objective Vitals:   07/30/21 2046 07/30/21 2349 07/31/21 0355 07/31/21 0733  BP:  104/66 121/71 126/81  Pulse: (!) 115 (!) 112 78 (!) 105  Resp:  _0 Temp:  97.7 F (36.5 C) 97.8 F (36.6 C) 97.8 F (36.6 C)  TempSrc:  Oral Oral Oral  SpO2:  98% 100% 100%  Weight:      Height:       Physical Exam General:  calmly resting at 30 deg in bed Heart: RRR Lungs: clear ant, no rales Abdomen: soft, midline dressing c/d/I, JP drain in place Extremities: minor edema Dialysis Access:  none, RIJ Temp HD cath  Additional Objective Labs: Basic Metabolic Panel: Recent Labs  Lab 07/29/21 1107 07/30/21 0741 07/31/21 0229  NA 140 137 140  K 4.2 4.3 4.4  CL 98 98 96*  CO2 20* 19* 19*  GLUCOSE 102* 88 82  BUN 77* 48* 55*  CREATININE 8.24* 5.75* 6.57*  CALCIUM 8.5* 8.4* 8.5*  PHOS 8.6* 6.7* 7.8*    Liver Function Tests: Recent Labs  Lab 07/29/21 1107 07/30/21 0741 07/31/21 0229  ALBUMIN 1.9* 2.1* 2.0*    No results for input(s): "LIPASE", "AMYLASE" in the last 168 hours.  CBC: Recent Labs  Lab 07/26/21 0556 07/27/21 0332 07/29/21 0650 07/30/21 0741 07/31/21 0229  WBC 11.2* 10.4 11.7* 14.7* 12.5*  NEUTROABS 8.9* 7.9* 9.0*  --   --   HGB 8.6* 8.7* 9.2* 10.8* 10.5*  HCT 24.6* 25.3* 27.7* 30.7* 30.7*  MCV 90.8 91.0 93.0 90.0 91.6  PLT 130* 129* 273 364 391    Blood Culture    Component Value Date/Time   SDES BLOOD RIGHT ANTECUBITAL 07/21/2021 1915   SPECREQUEST  07/21/2021 1915    BOTTLES DRAWN AEROBIC ONLY Blood Culture results may not be optimal due to an inadequate volume of blood received in culture bottles   CULT  07/21/2021 1915    NO GROWTH 5 DAYS Performed at [facility], [place] [address], [place], [place] [address]    REPTSTATUS 07/26/2021 FINAL 07/21/2021 1915    Cardiac  Enzymes: No results for input(s): "CKTOTAL", "CKMB", "CKMBINDEX", "TROPONINI" in the last 168 hours.  CBG: Recent Labs  Lab 07/30/21 1730 07/30/21 1854 07/30/21 1945 07/30/21 2349 07/31/21 0441  GLUCAP 45* 107* 116* 81 64*    Iron Studies:  Recent Labs    07/29/21 1107  IRON 23*  TIBC 164*  FERRITIN 162    _1 @ Studies/Results: IR Fluoro Guide CV Line Right  Result Date: 07/29/2021 INDICATION: 66 year old female referred for temporary non tunneled hemodialysis catheter EXAM: IMAGE GUIDED PLACEMENT OF NON TUNNELED HEMODIALYSIS CATHETER MEDICATIONS: None ANESTHESIA/SEDATION: None FLUOROSCOPY: Radiation Exposure Index (as provided by the fluoroscopic device): 12 seconds, 5 mGy Kerma COMPLICATIONS: None PROCEDURE: Informed written consent was obtained from the patient after a discussion of the risks, benefits, and alternatives to treatment. Questions regarding the procedure were encouraged and answered. The right neck was prepped with chlorhexidine in a sterile fashion, and a sterile drape was applied covering the operative field. Maximum barrier sterile technique with sterile gowns and gloves were used for the procedure. A timeout was performed prior to the initiation of the procedure. A micropuncture kit was utilized to access the right internal jugular vein under direct, real-time ultrasound guidance  after the overlying soft tissues were anesthetized with 1% lidocaine with epinephrine. Ultrasound image documentation was performed. The microwire was kinked to measure appropriate catheter length. A stiff wire was advanced to the level of the IVC. A 16 cm hemodialysis catheter was then placed over the wire. Final catheter positioning was confirmed and documented with a spot radiographic image. The catheter aspirates and flushes normally. The catheter was flushed with appropriate volume heparin dwells. Dressings were applied. The patient tolerated the procedure well without immediate  post procedural complication. IMPRESSION: Status post right IJ non tunneled triple-lumen hemodialysis catheter. Signed, Dulcy Fanny. Nadene Rubins, RPVI [facility] Radiology Electronically Signed   By: Corrie Mckusick D.O.   On: 07/29/2021 17:08   IR US Guide Vasc Access Right  Result Date: 07/29/2021 INDICATION: 66 year old female referred for temporary non tunneled hemodialysis catheter EXAM: IMAGE GUIDED PLACEMENT OF NON TUNNELED HEMODIALYSIS CATHETER MEDICATIONS: None ANESTHESIA/SEDATION: None FLUOROSCOPY: Radiation Exposure Index (as provided by the fluoroscopic device): 12 seconds, 5 mGy Kerma COMPLICATIONS: None PROCEDURE: Informed written consent was obtained from the patient after a discussion of the risks, benefits, and alternatives to treatment. Questions regarding the procedure were encouraged and answered. The right neck was prepped with chlorhexidine in a sterile fashion, and a sterile drape was applied covering the operative field. Maximum barrier sterile technique with sterile gowns and gloves were used for the procedure. A timeout was performed prior to the initiation of the procedure. A micropuncture kit was utilized to access the right internal jugular vein under direct, real-time ultrasound guidance after the overlying soft tissues were anesthetized with 1% lidocaine with epinephrine. Ultrasound image documentation was performed. The microwire was kinked to measure appropriate catheter length. A stiff wire was advanced to the level of the IVC. A 16 cm hemodialysis catheter was then placed over the wire. Final catheter positioning was confirmed and documented with a spot radiographic image. The catheter aspirates and flushes normally. The catheter was flushed with appropriate volume heparin dwells. Dressings were applied. The patient tolerated the procedure well without immediate post procedural complication. IMPRESSION: Status post right IJ non  tunneled triple-lumen hemodialysis catheter. Signed, Dulcy Fanny. Nadene Rubins, RPVI [facility] Radiology Electronically Signed   By: Corrie Mckusick D.O.   On: 07/29/2021 17:08   Medications:  sodium chloride 10 mL/hr at 07/27/21 0654    Chlorhexidine Gluconate Cloth  6 each Topical Daily   Chlorhexidine Gluconate Cloth  6 each Topical Q0600   feeding supplement (NEPRO CARB STEADY)  237 mL Oral BID BM   heparin injection (subcutaneous)  5,000 Units Subcutaneous Q8H   metoprolol tartrate  25 mg Oral BID   neomycin-bacitracin-polymyxin   Topical TID   pantoprazole  40 mg Oral Daily   polycarbophil  625 mg Oral BID   sodium chloride flush  10-40 mL Intracatheter Q12H   sodium chloride flush  3 mL Intravenous Q12H    Assessment/Plan: 1.  Acute kidney injury chronic kidney disease stage IIIa: Appears consistent with ATN of septic shock.  Initiated HD 7/10 for worsening azotemia/metabolic acidosis. Hopefully will be temporary HD until she has renal recovery. Labs indicate will have ongoing dialysis needs, will plan for next treatment tomorrow but cont daily assessment.  Purewick hasn't been great at capturing urine volume.   IR placed RIJ temp HD catheter 7/14, ran poorly so has tPA dwell in currently.   Avoid nephrotoxic medications including NSAIDs and iodinated intravenous contrast exposure  unless the latter is absolutely indicated.  Preferred narcotic agents for pain control are hydromorphone, fentanyl, and methadone. Morphine should not be used. Avoid Baclofen and avoid oral sodium phosphate and magnesium citrate based laxatives / bowel preps. Continue strict Input and Output monitoring. Will monitor the patient closely with you and intervene or adjust therapy as indicated by changes in clinical status/labs.  2.  Incarcerated ventral hernia: Status post exploratory laparotomy with small bowel resection, adhesiolysis and hernia repair with JP drain  placement.  Ongoing follow-up by surgery. 3. Hyperphosphatemia: phos 9.2 > 6.7>7.8, follow - secondary to AKI: start binder when eating more if remains issue 4.  Peritonitis: With resolution of septic shock, weaned off of pressors and remains on corticosteroids.  completed course of Zosyn. 5.  Anemia: Possibly chronic from malabsorption given prior history of small bowel resection.  Exacerbated by acute/critical illness and perioperative blood losses.  Iron sat 14, ferrritin 162; receiving IV iron load.  Hb in the 10s now.  Will follow, page with concerns.  Jannifer Hick MD 07/31/2021, 11:54 AM  [facility] Pager: [phone]

## 2021-07-31 NOTE — Progress Notes (Signed)
PROGRESS NOTE  [name]  KCL:[id number] DOB: 05-09-1955 DOA: 07/21/2021 PCP: Mancel Bale, PA-C   Brief Narrative:  Patient is a 66 year old female with history of hypertension, CKD stage III a,hyperlipidemia, chronically incarcerated large abdominal wall hernia which was repaired multiple times, recent SBO resection with mesh placement who presented to the ER with severe abdominal pain, vomiting.  Lab work showed creatinine of 5.2, lactic acid of 6.5, leukocytosis.  CT abdomen/pelvis showed large SBO, large lower abdominal ventral hernia containing much of the small bowel.  General surgery consulted and she was taken to the OR for emergent exploratory laparotomy and hernia repair,she was transferred to ICU.  Currently hemodynamically stable and patient transferred to [facility] service on 7/13.  On dialysis for persistent AKI, nephrology following.  General surgery following.  PT recommending skilled nursing facility on discharge   Assessment & Plan:  Principal Problem:   Ventral hernia with bowel obstruction Active Problems:   SIRS (systemic inflammatory response syndrome) (HCC)   Lactic acidosis   Leukocytosis   Acute renal failure superimposed on stage 3b chronic kidney disease (HCC)   Transient hypotension   Hypokalemia   History of small bowel obstruction   Body mass index 40.0-44.9, adult ([place])   H/O exploratory laparotomy   Septic shock (HCC)   Ventilator dependence (HCC)   Protein-calorie malnutrition, severe   Incarcerated/strangulated ventral hernia: Status post small bowel resection, lysis of additions, repair of hernia and JP drain placement.  Also found to have peritonitis.  Completed antibiotics course.   General surgery following.  Having bowel movements now.  On soft diet.  Denies any abdominal pain.  Having loose bowel movements, started on Imodium  AKI on CKD stage 3A: Likely from ATN from septic shock secondary to peritonitis.  Initiated hemodialysis on 7/10,  nephrology following.  New temporary dialysis catheter placed on right neck  Normocytic anemia: Likely associated with critical illness, chronic kidney disease.  Hemoglobin stable in the range of 10.  Continue to monitor.  Low iron as per labs,given iv iron  Intermittent sinus tachycardia: Unclear etiology.  Echo showed EF of more than 75%, no regional wall motion abnormality, grade 1 diastolic dysfunction, no valvular abnormality.  Continue  metoprolol at 25 mg bid.  TSH not low  History of hypertension: Currently normotensive.  On amlodipine at home, currently on hold  Acute hypoxic respiratory failure: Had to be put on ventilator after surgery, currently extubated and on room air  Obesity: BMI of 37.1  Debility/deconditioning: Patient seen by PT and recommended skilled nursing facility on discharge.TOC following   Nutrition Problem: Severe Malnutrition Etiology: chronic illness (chronic incarcerated hernia)    DVT prophylaxis:heparin injection 5,000 Units Start: 07/22/21 2200     Code Status: Full Code  Family Communication: Sister at bedside on 7/15  Patient status:Inpatient  Patient is from :Home  Anticipated discharge to:SNF  Estimated DC date:after surgical /nephrology clearance   Consultants: General surgery  Procedures: Intubation, exploratory laparotomy  Antimicrobials:  Anti-infectives (From admission, onward)    Start     Dose/Rate Route Frequency Ordered Stop   07/23/21 1730  piperacillin-tazobactam (ZOSYN) IVPB 2.25 g        2.25 g 100 mL/hr over 30 Minutes Intravenous Every 8 hours 07/23/21 1207 07/28/21 1019   07/22/21 0900  piperacillin-tazobactam (ZOSYN) IVPB 2.25 g  Status:  Discontinued        2.25 g 100 mL/hr over 30 Minutes Intravenous Every 8 hours 07/22/21 0833 07/23/21 1207   07/22/21  0830  piperacillin-tazobactam (ZOSYN) IVPB 3.375 g  Status:  Discontinued        3.375 g 100 mL/hr over 30 Minutes Intravenous Every 8 hours 07/22/21 0827  07/22/21 0831   07/22/21 0000  piperacillin-tazobactam (ZOSYN) IVPB 3.375 g        3.375 g 100 mL/hr over 30 Minutes Intravenous  Once 07/21/21 2352 07/22/21 0142       Subjective: Patient seen and examined at the bedside this morning.  Hemodynamically stable without any complaints today.  Lying in bed, appears weak, deconditioned.  We discussed about recommendation for skilled nursing facility and patient and family are agreeable.  Denies any new complaints today.  Objective: Vitals:   07/30/21 2046 07/30/21 2349 07/31/21 0355 07/31/21 0733  BP:  104/66 121/71 126/81  Pulse: (!) 115 (!) 112 78 (!) 105  Resp:  '17 14 14  ' Temp:  97.7 F (36.5 C) 97.8 F (36.6 C) 97.8 F (36.6 C)  TempSrc:  Oral Oral Oral  SpO2:  98% 100% 100%  Weight:      Height:        Intake/Output Summary (Last 24 hours) at 07/31/2021 1431 Last data filed at 07/31/2021 0500 Gross per 24 hour  Intake 236 ml  Output 15 ml  Net 221 ml   Filed Weights   07/27/21 1028 07/27/21 1304 07/29/21 1035  Weight: 90.7 kg 89.2 kg 86.9 kg    Examination:  General exam: Overall comfortable, lying in bed, weak HEENT: PERRL, temporary dialysis catheter on the right neck Respiratory system:  no wheezes or crackles  Cardiovascular system: S1 & S2 heard, RRR.  Gastrointestinal system: Abdomen is nondistended, soft and nontender.  Midline surgical wound with staples, JP drain of the left lower quadrant Central nervous system: Alert and oriented Extremities: No edema, no clubbing ,no cyanosis Skin: No rashes, no ulcers,no icterus     Data Reviewed: I have personally reviewed following labs and imaging studies  CBC: Recent Labs  Lab 07/26/21 0556 07/27/21 0332 07/29/21 0650 07/30/21 0741 07/31/21 0229  WBC 11.2* 10.4 11.7* 14.7* 12.5*  NEUTROABS 8.9* 7.9* 9.0*  --   --   HGB 8.6* 8.7* 9.2* 10.8* 10.5*  HCT 24.6* 25.3* 27.7* 30.7* 30.7*  MCV 90.8 91.0 93.0 90.0 91.6  PLT 130* 129* 273 364 920   Basic  Metabolic Panel: Recent Labs  Lab 07/26/21 0556 07/27/21 0332 07/29/21 1107 07/30/21 0741 07/31/21 0229  NA 141 140 140 137 140  K 4.1 4.7 4.2 4.3 4.4  CL 102 103 98 98 96*  CO2 20* 20* 20* 19* 19*  GLUCOSE 90 82 102* 88 82  BUN 78* 94* 77* 48* 55*  CREATININE 6.77* 8.15* 8.24* 5.75* 6.57*  CALCIUM 7.4* 7.8* 8.5* 8.4* 8.5*  MG 1.9  --   --   --   --   PHOS 6.7* 9.2* 8.6* 6.7* 7.8*     Recent Results (from the past 240 hour(s))  Culture, blood (routine x 2)     Status: None   Collection Time: 07/21/21  7:15 PM   Specimen: BLOOD  Result Value Ref Range Status   Specimen Description BLOOD RIGHT ANTECUBITAL  Final   Special Requests   Final    BOTTLES DRAWN AEROBIC ONLY Blood Culture results may not be optimal due to an inadequate volume of blood received in culture bottles   Culture   Final    NO GROWTH 5 DAYS Performed at [facility], [address],  [place], [place] [address]    Report Status 07/26/2021 FINAL  Final  MRSA Next Gen by PCR, Nasal     Status: None   Collection Time: 07/22/21  6:38 PM   Specimen: Nasal Mucosa; Nasal Swab  Result Value Ref Range Status   MRSA by PCR Next Gen NOT DETECTED NOT DETECTED Final    Comment: (NOTE) The GeneXpert MRSA Assay (FDA approved for NASAL specimens only), is one component of a comprehensive MRSA colonization surveillance program. It is not intended to diagnose MRSA infection nor to guide or monitor treatment for MRSA infections. Test performance is not FDA approved in patients less than 81 years old. Performed at [facility], [place] [address], [place], [place] [address]      Radiology Studies: IR Fluoro Guide CV Line Right  Result Date: 07/29/2021 INDICATION: 66 year old female referred for temporary non tunneled hemodialysis catheter EXAM: IMAGE GUIDED PLACEMENT OF NON TUNNELED HEMODIALYSIS CATHETER MEDICATIONS: None ANESTHESIA/SEDATION: None FLUOROSCOPY: Radiation Exposure Index (as provided by the  fluoroscopic device): 12 seconds, 5 mGy Kerma COMPLICATIONS: None PROCEDURE: Informed written consent was obtained from the patient after a discussion of the risks, benefits, and alternatives to treatment. Questions regarding the procedure were encouraged and answered. The right neck was prepped with chlorhexidine in a sterile fashion, and a sterile drape was applied covering the operative field. Maximum barrier sterile technique with sterile gowns and gloves were used for the procedure. A timeout was performed prior to the initiation of the procedure. A micropuncture kit was utilized to access the right internal jugular vein under direct, real-time ultrasound guidance after the overlying soft tissues were anesthetized with 1% lidocaine with epinephrine. Ultrasound image documentation was performed. The microwire was kinked to measure appropriate catheter length. A stiff wire was advanced to the level of the IVC. A 16 cm hemodialysis catheter was then placed over the wire. Final catheter positioning was confirmed and documented with a spot radiographic image. The catheter aspirates and flushes normally. The catheter was flushed with appropriate volume heparin dwells. Dressings were applied. The patient tolerated the procedure well without immediate post procedural complication. IMPRESSION: Status post right IJ non tunneled triple-lumen hemodialysis catheter. Signed, Dulcy Fanny. Nadene Rubins, RPVI [facility] Radiology Electronically Signed   By: Corrie Mckusick D.O.   On: 07/29/2021 17:08   IR US Guide Vasc Access Right  Result Date: 07/29/2021 INDICATION: 66 year old female referred for temporary non tunneled hemodialysis catheter EXAM: IMAGE GUIDED PLACEMENT OF NON TUNNELED HEMODIALYSIS CATHETER MEDICATIONS: None ANESTHESIA/SEDATION: None FLUOROSCOPY: Radiation Exposure Index (as provided by the fluoroscopic device): 12 seconds, 5 mGy Kerma COMPLICATIONS: None  PROCEDURE: Informed written consent was obtained from the patient after a discussion of the risks, benefits, and alternatives to treatment. Questions regarding the procedure were encouraged and answered. The right neck was prepped with chlorhexidine in a sterile fashion, and a sterile drape was applied covering the operative field. Maximum barrier sterile technique with sterile gowns and gloves were used for the procedure. A timeout was performed prior to the initiation of the procedure. A micropuncture kit was utilized to access the right internal jugular vein under direct, real-time ultrasound guidance after the overlying soft tissues were anesthetized with 1% lidocaine with epinephrine. Ultrasound image documentation was performed. The microwire was kinked to measure appropriate catheter length. A stiff wire was advanced to the level of the IVC. A 16 cm hemodialysis catheter was then placed over the wire. Final catheter positioning was confirmed and  documented with a spot radiographic image. The catheter aspirates and flushes normally. The catheter was flushed with appropriate volume heparin dwells. Dressings were applied. The patient tolerated the procedure well without immediate post procedural complication. IMPRESSION: Status post right IJ non tunneled triple-lumen hemodialysis catheter. Signed, Dulcy Fanny. Nadene Rubins, RPVI [facility] Radiology Electronically Signed   By: Corrie Mckusick D.O.   On: 07/29/2021 17:08    Scheduled Meds:  Chlorhexidine Gluconate Cloth  6 each Topical Daily   Chlorhexidine Gluconate Cloth  6 each Topical Q0600   feeding supplement (NEPRO CARB STEADY)  237 mL Oral BID BM   heparin injection (subcutaneous)  5,000 Units Subcutaneous Q8H   metoprolol tartrate  25 mg Oral BID   neomycin-bacitracin-polymyxin   Topical TID   pantoprazole  40 mg Oral Daily   polycarbophil  625 mg Oral BID   sodium chloride flush  10-40 mL  Intracatheter Q12H   sodium chloride flush  3 mL Intravenous Q12H   Continuous Infusions:  sodium chloride 10 mL/hr at 07/27/21 0654     LOS: 9 days   Shelly Coss, MD [facility] P7/16/2023, 2:31 PM

## 2021-08-01 ENCOUNTER — Inpatient Hospital Stay (HOSPITAL_COMMUNITY): Payer: BC Managed Care – PPO

## 2021-08-01 DIAGNOSIS — K436 Other and unspecified ventral hernia with obstruction, without gangrene: Secondary | ICD-10-CM | POA: Diagnosis not present

## 2021-08-01 HISTORY — PX: IR FLUORO GUIDE CV LINE RIGHT: IMG2283

## 2021-08-01 LAB — RENAL FUNCTION PANEL
Albumin: 1.8 g/dL — ABNORMAL LOW (ref 3.5–5.0)
Anion gap: 12 (ref 5–15)
BUN: 62 mg/dL — ABNORMAL HIGH (ref 8–23)
CO2: 24 mmol/L (ref 22–32)
Calcium: 8.4 mg/dL — ABNORMAL LOW (ref 8.9–10.3)
Chloride: 101 mmol/L (ref 98–111)
Creatinine, Ser: 7.27 mg/dL — ABNORMAL HIGH (ref 0.44–1.00)
GFR, Estimated: 6 mL/min — ABNORMAL LOW (ref >60)
Glucose, Bld: 98 mg/dL (ref 70–99)
Phosphorus: 8 mg/dL — ABNORMAL HIGH (ref 2.5–4.6)
Potassium: 4.2 mmol/L (ref 3.5–5.1)
Sodium: 137 mmol/L (ref 135–145)

## 2021-08-01 LAB — GLUCOSE, CAPILLARY
Glucose-Capillary: 47 mg/dL — ABNORMAL LOW (ref 70–99)
Glucose-Capillary: 48 mg/dL — ABNORMAL LOW (ref 70–99)
Glucose-Capillary: 57 mg/dL — ABNORMAL LOW (ref 70–99)
Glucose-Capillary: 84 mg/dL (ref 70–99)
Glucose-Capillary: 89 mg/dL (ref 70–99)
Glucose-Capillary: 92 mg/dL (ref 70–99)
Glucose-Capillary: 94 mg/dL (ref 70–99)

## 2021-08-01 MED ORDER — HEPARIN SOD (PORK) LOCK FLUSH 100 UNIT/ML IV SOLN
INTRAVENOUS | Status: AC | PRN
  Administered 2021-08-01: 500 [IU] via INTRAVENOUS

## 2021-08-01 MED ORDER — SEVELAMER CARBONATE 800 MG PO TABS
800.0000 mg | ORAL_TABLET | Freq: Three times a day (TID) | ORAL | Status: DC
  Administered 2021-08-02 – 2021-08-05 (×6): 800 mg via ORAL
  Filled 2021-08-01 (×6): qty 1

## 2021-08-01 MED ORDER — LIDOCAINE HCL 1 % IJ SOLN
INTRAMUSCULAR | Status: AC
  Filled 2021-08-01: qty 20

## 2021-08-01 MED ORDER — CHLORHEXIDINE GLUCONATE 4 % EX LIQD
CUTANEOUS | Status: AC
  Filled 2021-08-01: qty 15

## 2021-08-01 MED ORDER — HEPARIN SODIUM (PORCINE) 1000 UNIT/ML IJ SOLN
INTRAMUSCULAR | Status: AC
  Filled 2021-08-01: qty 10

## 2021-08-01 NOTE — Progress Notes (Signed)
PT Cancellation Note  Patient Details Name: [name] MRN: [id number] DOB: 1955-09-19   Cancelled Treatment:    Reason Eval/Treat Not Completed: Patient at procedure or test/unavailable, pt off unit with IR. Will check back as schedule allows to continue with PT POC.  Audry Riles. PTA Acute Rehabilitation Services Office: [place] 08/01/2021, 3:27 PM

## 2021-08-01 NOTE — Consult Note (Signed)
[place] Nurse wound follow up Wound type:intertriginous dermatitis to abdominal pannus.  Area remains moist and tender.  No interdry in place.  No Xeroform in place.  I have placed supply order with unit secretary for 2 sheets Interdry [phone]) and 3 sheets Xeroform ([facility] # [id number])  Place Xeroform over nonintact lesions and cover with silicone foam.  Apply Interdry as instructed to skin fold to wick moisture and protect intact skin.  Measure and cut length of InterDry to fit in skin folds that have skin breakdown Tuck InterDry fabric into skin folds in a single layer, allow for 2 inches of overhang from skin edges to allow for wicking to occur May remove to bathe; dry area thoroughly and then tuck into affected areas again Do not apply any creams or ointments when using InterDry DO NOT THROW AWAY FOR 5 DAYS unless soiled with stool DO NOT [facility] product, this will inactivate the silver in the material  New sheet of Interdry should be applied after 5 days of use if patient continues to have skin breakdown   Ensure patient is bathed well daily in skin folds.  Clarified orders with bedside RN, Frederico Hamman.  Will not follow at this time.  Please re-consult if needed.  Domenic Moras MSN, RN, FNP-BC CWON Wound, Ostomy, Continence Nurse Pager [phone]

## 2021-08-01 NOTE — Discharge Instructions (Signed)
[facility]      [facility] [phone]  OPEN ABDOMINAL SURGERY: POST OP INSTRUCTIONS  Always review your discharge instruction sheet given to you by the facility where your surgery was performed.  IF YOU HAVE DISABILITY OR FAMILY LEAVE FORMS, YOU MUST BRING THEM TO THE OFFICE FOR PROCESSING.  PLEASE DO NOT GIVE THEM TO YOUR DOCTOR.  A prescription for pain medication may be given to you upon discharge.  Take your pain medication as prescribed, if needed.  If narcotic pain medicine is not needed, then you may take acetaminophen (Tylenol) or ibuprofen (Advil) as needed. Take your usually prescribed medications unless otherwise directed. If you need a refill on your pain medication, please contact your pharmacy. They will contact our office to request authorization.  Prescriptions will not be filled after 5pm or on week-ends. You should follow a light diet the first few days after arrival home, such as soup and crackers, pudding, etc.unless your doctor has advised otherwise. A high-fiber, low fat diet can be resumed as tolerated.   Be sure to include lots of fluids daily. Most patients will experience some swelling and bruising on the chest and neck area.  Ice packs will help.  Swelling and bruising can take several days to resolve Most patients will experience some swelling and bruising in the area of the incision. Ice pack will help. Swelling and bruising can take several days to resolve..  It is common to experience some constipation if taking pain medication after surgery.  Increasing fluid intake and taking a stool softener will usually help or prevent this problem from occurring.  A mild laxative (Milk of Magnesia or Miralax) should be taken according to package directions if there are no bowel movements after 48 hours.  You may have steri-strips (small skin tapes) in place directly over the incision.  These strips should be left on the skin for 7-10 days.  If your surgeon used skin  glue on the incision, you may shower in 24 hours.  The glue will flake off over the next 2-3 weeks.  Any sutures or staples will be removed at the office during your follow-up visit. You may find that a light gauze bandage over your incision may keep your staples from being rubbed or pulled. You may shower and replace the bandage daily. ACTIVITIES:  You may resume regular (light) daily activities beginning the next day--such as daily self-care, walking, climbing stairs--gradually increasing activities as tolerated.  You may have sexual intercourse when it is comfortable.  Refrain from any heavy lifting or straining until approved by your doctor. You may drive when you no longer are taking prescription pain medication, you can comfortably wear a seatbelt, and you can safely maneuver your car and apply brakes Return to Work: ___________________________________ You should see your doctor in the office for a follow-up appointment approximately two weeks after your surgery.  Make sure that you call for this appointment within a day or two after you arrive home to insure a convenient appointment time. OTHER INSTRUCTIONS:  _____________________________________________________________ _____________________________________________________________  WHEN TO CALL YOUR DOCTOR: Fever over 101.0 Inability to urinate Nausea and/or vomiting Extreme swelling or bruising Continued bleeding from incision. Increased pain, redness, or drainage from the incision. Difficulty swallowing or breathing Muscle cramping or spasms. Numbness or tingling in hands or feet or around lips.  The clinic staff is available to answer your questions during regular business hours.  Please don't hesitate to call and ask to speak to one of   the nurses if you have concerns.  For further questions, please visit [email or web address]  

## 2021-08-01 NOTE — Progress Notes (Signed)
PROGRESS NOTE  [name]  GHW:[id number] DOB: [birth year] DOA: 07/21/2021 PCP: Mancel Bale, PA-C   Brief Narrative:  Patient is a 66 year old female with history of hypertension, CKD stage III a,hyperlipidemia, chronically incarcerated large abdominal wall hernia which was repaired multiple times, recent SBO resection with mesh placement who presented to the ER with severe abdominal pain, vomiting.  Lab work showed creatinine of 5.2, lactic acid of 6.5, leukocytosis.  CT abdomen/pelvis showed large SBO, large lower abdominal ventral hernia containing much of the small bowel.  General surgery consulted and she was taken to the OR for emergent exploratory laparotomy and hernia repair,she was transferred to ICU.  Currently hemodynamically stable and patient transferred to [facility] service on 7/13.  On dialysis for persistent AKI, nephrology following.  General surgery following.  PT recommending skilled nursing facility on discharge   Assessment & Plan:  Principal Problem:   Ventral hernia with bowel obstruction Active Problems:   SIRS (systemic inflammatory response syndrome) (HCC)   Lactic acidosis   Leukocytosis   Acute renal failure superimposed on stage 3b chronic kidney disease (HCC)   Transient hypotension   Hypokalemia   History of small bowel obstruction   Body mass index 40.0-44.9, adult ([place])   H/O exploratory laparotomy   Septic shock (HCC)   Ventilator dependence (HCC)   Protein-calorie malnutrition, severe   Incarcerated/strangulated ventral hernia: Status post small bowel resection, lysis of additions, repair of hernia and JP drain placement.  Also found to have peritonitis.  Completed antibiotics course.   General surgery following.  Having bowel movements now.  On soft diet.  Denies any abdominal pain.  Having loose bowel movements, started on Imodium  AKI on CKD stage 3A: Likely from ATN from septic shock secondary to peritonitis.  Initiated hemodialysis on 7/10,  nephrology following.  New temporary dialysis catheter placed on right neck.  But is not functioning so IR again consulted for new catheter.  Normocytic anemia: Likely associated with critical illness, chronic kidney disease.  Hemoglobin stable in the range of 10.  Continue to monitor.  Low iron as per labs,given iv iron  Intermittent sinus tachycardia: Unclear etiology.  Echo showed EF of more than 75%, no regional wall motion abnormality, grade 1 diastolic dysfunction, no valvular abnormality.  Continue  metoprolol at 25 mg bid.  TSH not low  History of hypertension: Currently normotensive.  On amlodipine at home, currently on hold  Acute hypoxic respiratory failure: Had to be put on ventilator after surgery, currently extubated and on room air  Obesity: BMI of 37.1  Debility/deconditioning: Patient seen by PT and recommended skilled nursing facility on discharge.TOC following   Nutrition Problem: Severe Malnutrition Etiology: chronic illness (chronic incarcerated hernia)    DVT prophylaxis:heparin injection 5,000 Units Start: 07/22/21 2200     Code Status: Full Code  Family Communication: Sister at bedside on 7/17  Patient status:Inpatient  Patient is from :Home  Anticipated discharge to:SNF  Estimated DC date:after surgical /nephrology clearance   Consultants: General surgery  Procedures: Intubation, exploratory laparotomy  Antimicrobials:  Anti-infectives (From admission, onward)    Start     Dose/Rate Route Frequency Ordered Stop   07/23/21 1730  piperacillin-tazobactam (ZOSYN) IVPB 2.25 g        2.25 g 100 mL/hr over 30 Minutes Intravenous Every 8 hours 07/23/21 1207 07/28/21 1019   07/22/21 0900  piperacillin-tazobactam (ZOSYN) IVPB 2.25 g  Status:  Discontinued        2.25 g 100 mL/hr over 30  Minutes Intravenous Every 8 hours 07/22/21 0833 07/23/21 1207   07/22/21 0830  piperacillin-tazobactam (ZOSYN) IVPB 3.375 g  Status:  Discontinued        3.375 g 100  mL/hr over 30 Minutes Intravenous Every 8 hours 07/22/21 0827 07/22/21 0831   07/22/21 0000  piperacillin-tazobactam (ZOSYN) IVPB 3.375 g        3.375 g 100 mL/hr over 30 Minutes Intravenous  Once 07/21/21 2352 07/22/21 0142       Subjective: Patient seen and examined at bedside at dialysis suite.  Hemodynamically stable without any complaints Objective: Vitals:   08/01/21 0816 08/01/21 0855 08/01/21 0859 08/01/21 0919  BP:   136/80 124/76  Pulse:   99 93  Resp:   16 15  Temp: 97.6 F (36.4 C)  97.7 F (36.5 C)   TempSrc: Oral     SpO2: 92%  100% 99%  Weight:  81.9 kg    Height:        Intake/Output Summary (Last 24 hours) at 08/01/2021 1130 Last data filed at 08/01/2021 0700 Gross per 24 hour  Intake 3 ml  Output 200 ml  Net -197 ml   Filed Weights   07/27/21 1304 07/29/21 1035 08/01/21 0855  Weight: 89.2 kg 86.9 kg 81.9 kg    Examination:  General exam: Overall comfortable, not in distress, weak, chronically looking HEENT: PERRL, right IJ temporary dialysis catheter Respiratory system:  no wheezes or crackles  Cardiovascular system: S1 & S2 heard, RRR.  Gastrointestinal system: Abdomen is nondistended, soft and nontender midline surgical wound with staples, JP drain on the right lower quadrant Central nervous system: Alert and oriented Extremities: No edema, no clubbing ,no cyanosis Skin: No rashes, no ulcers,no icterus     Data Reviewed: I have personally reviewed following labs and imaging studies  CBC: Recent Labs  Lab 07/26/21 0556 07/27/21 0332 07/29/21 0650 07/30/21 0741 07/31/21 0229  WBC 11.2* 10.4 11.7* 14.7* 12.5*  NEUTROABS 8.9* 7.9* 9.0*  --   --   HGB 8.6* 8.7* 9.2* 10.8* 10.5*  HCT 24.6* 25.3* 27.7* 30.7* 30.7*  MCV 90.8 91.0 93.0 90.0 91.6  PLT 130* 129* 273 364 242   Basic Metabolic Panel: Recent Labs  Lab 07/26/21 0556 07/27/21 0332 07/29/21 1107 07/30/21 0741 07/31/21 0229 08/01/21 0440  NA 141 140 140 137 140 137  K 4.1  4.7 4.2 4.3 4.4 4.2  CL 102 103 98 98 96* 101  CO2 20* 20* 20* 19* 19* 24  GLUCOSE 90 82 102* 88 82 98  BUN 78* 94* 77* 48* 55* 62*  CREATININE 6.77* 8.15* 8.24* 5.75* 6.57* 7.27*  CALCIUM 7.4* 7.8* 8.5* 8.4* 8.5* 8.4*  MG 1.9  --   --   --   --   --   PHOS 6.7* 9.2* 8.6* 6.7* 7.8* 8.0*     Recent Results (from the past 240 hour(s))  MRSA Next Gen by PCR, Nasal     Status: None   Collection Time: 07/22/21  6:38 PM   Specimen: Nasal Mucosa; Nasal Swab  Result Value Ref Range Status   MRSA by PCR Next Gen NOT DETECTED NOT DETECTED Final    Comment: (NOTE) The GeneXpert MRSA Assay (FDA approved for NASAL specimens only), is one component of a comprehensive MRSA colonization surveillance program. It is not intended to diagnose MRSA infection nor to guide or monitor treatment for MRSA infections. Test performance is not FDA approved in patients less than 35 years old. Performed at [facility]  [facility], [place] [address], [place], [place] [address]      Radiology Studies: No results found.  Scheduled Meds:  Chlorhexidine Gluconate Cloth  6 each Topical Daily   Chlorhexidine Gluconate Cloth  6 each Topical Q0600   feeding supplement (NEPRO CARB STEADY)  237 mL Oral BID BM   heparin injection (subcutaneous)  5,000 Units Subcutaneous Q8H   metoprolol tartrate  25 mg Oral BID   neomycin-bacitracin-polymyxin   Topical TID   pantoprazole  40 mg Oral Daily   polycarbophil  625 mg Oral BID   sevelamer carbonate  800 mg Oral TID WC   sodium chloride flush  10-40 mL Intracatheter Q12H   sodium chloride flush  3 mL Intravenous Q12H   Continuous Infusions:  sodium chloride 10 mL/hr at 07/27/21 0654     LOS: 10 days   Shelly Coss, MD [facility] P7/17/2023, 11:30 AM

## 2021-08-01 NOTE — Procedures (Signed)
Interventional Radiology Procedure Note  Procedure: Temp HD Catheter exchange  Indication: Poorly functioning temp cath  Findings: Please refer to procedural dictation for full description.  Complications: None  EBL: < 10 mL  Miachel Roux, MD [phone]

## 2021-08-01 NOTE — Progress Notes (Signed)
Patient ID: [name], female   DOB: February 02, 1955, 66 y.o.   MRN: [id number] [facility] Surgery Progress Note  10 Days Post-Op  Subjective: Tolerating soft diet, no BM yesterday.  No nausea.  Pain controlled overall  Objective: Vital signs in last 24 hours: Temp:  [97.5 F (36.4 C)-98.6 F (37 C)] 97.7 F (36.5 C) (07/17 0859) Pulse Rate:  [80-101] 93 (07/17 0919) Resp:  [13-17] 15 (07/17 0919) BP: (112-138)/(60-81) 124/76 (07/17 0919) SpO2:  [92 %-100 %] 99 % (07/17 0919) Weight:  [81.9 kg] 81.9 kg (07/17 0855) Last BM Date : 07/31/21  Intake/Output from previous day: 07/16 0701 - 07/17 0700 In: 3 [I.V.:3] Out: 200 [Urine:200] Intake/Output this shift: No intake/output data recorded.  PE: Abd: soft, mild diffuse tenderness without rebound or guarding, no peritonitis,  Incision C/D/I with staples present.  What appeared to mostly be scattered ecchymosis last week appears more dark today and some of the skin has excoriated in these areas, particularly in her pannus.  The underneath of this is too moist and dressing not as intended per WOC note.  Lab Results:  Recent Labs    07/30/21 0741 07/31/21 0229  WBC 14.7* 12.5*  HGB 10.8* 10.5*  HCT 30.7* 30.7*  PLT 364 391   BMET Recent Labs    07/31/21 0229 08/01/21 0440  NA 140 137  K 4.4 4.2  CL 96* 101  CO2 19* 24  GLUCOSE 82 98  BUN 55* 62*  CREATININE 6.57* 7.27*  CALCIUM 8.5* 8.4*   PT/INR No results for input(s): "LABPROT", "INR" in the last 72 hours.  CMP     Component Value Date/Time   NA 137 08/01/2021 0440   NA 146 (H) 07/11/2017 1547   K 4.2 08/01/2021 0440   CL 101 08/01/2021 0440   CO2 24 08/01/2021 0440   GLUCOSE 98 08/01/2021 0440   BUN 62 (H) 08/01/2021 0440   BUN 14 07/11/2017 1547   CREATININE 7.27 (H) 08/01/2021 0440   CREATININE 1.04 05/06/2014 1538   CALCIUM 8.4 (L) 08/01/2021 0440   PROT 5.0 (L) 07/23/2021 0319   PROT 6.1 07/11/2017 1547   ALBUMIN 1.8 (L) 08/01/2021 0440    ALBUMIN 3.5 (L) 07/11/2017 1547   AST 36 07/23/2021 0319   ALT 34 07/23/2021 0319   ALKPHOS 36 (L) 07/23/2021 0319   BILITOT 1.0 07/23/2021 0319   BILITOT <0.2 07/11/2017 1547   GFRNONAA 6 (L) 08/01/2021 0440   GFRAA 50 (L) 09/13/2019 0755   Lipase     Component Value Date/Time   LIPASE 24 07/21/2021 2056       Studies/Results: No results found.  Anti-infectives: Anti-infectives (From admission, onward)    Start     Dose/Rate Route Frequency Ordered Stop   07/23/21 1730  piperacillin-tazobactam (ZOSYN) IVPB 2.25 g        2.25 g 100 mL/hr over 30 Minutes Intravenous Every 8 hours 07/23/21 1207 07/28/21 1019   07/22/21 0900  piperacillin-tazobactam (ZOSYN) IVPB 2.25 g  Status:  Discontinued        2.25 g 100 mL/hr over 30 Minutes Intravenous Every 8 hours 07/22/21 0833 07/23/21 1207   07/22/21 0830  piperacillin-tazobactam (ZOSYN) IVPB 3.375 g  Status:  Discontinued        3.375 g 100 mL/hr over 30 Minutes Intravenous Every 8 hours 07/22/21 0827 07/22/21 0831   07/22/21 0000  piperacillin-tazobactam (ZOSYN) IVPB 3.375 g        3.375 g 100 mL/hr over 30 Minutes  Intravenous  Once 07/21/21 2352 07/22/21 0142        Assessment/Plan POD#10 s/p  exploratory laparotomy, small bowel resection, adhesiolysis x90 minutes, primary repair of incarcerated ventral hernia, JP drain placement 7/7 Dr. Bobbye Morton for incarcerated and strangulated ventral hernia - drain removed at bedside 7/16 - soft diet, bid fiber - nepro added and she is drinking - completed 5 days post-op abx, WBC 12 - encourage mobilization  -I have reached out to [facility] regarding her skin excoriation etc.  Her recommendations are great, but hasn't been followed.  Will have her readress with nursing and re-eval to make sure no changes are required.  ID - zosyn 7/7>7/12 FEN - soft diet VTE - SQH Foley - not present  - below per TRH -   Septic shock - off pressors 7/9 ARF - HD started 7/10 VDRF - extubated  7/11 Elevated troponin - cards has seen, suspect secondary to critical illness and not ACS HTN Anemia - hgb stable    LOS: 10 days    Henreitta Cea, [facility] Surgery 08/01/2021, 10:45 AM Please see Amion for pager number during day hours 7:00am-4:30pm

## 2021-08-01 NOTE — Progress Notes (Addendum)
High arterial pressure alarm noted. Tech reversed connection to catheter. Reported resistance in pulling and pushing with 10 cc syringe. Provider advised HD treatment will be held off today until IR can re-assess RIJ access. Report given to floor RN. Patient denies pain or discomfort. Patient is alert and oriented x 4. Provider explained situation to patient. Patient understood. Patient transported back to room.

## 2021-08-01 NOTE — Progress Notes (Addendum)
[facility] Progress Note   Subjective:  Patient seen and examined in HD unit. Not able to run on HD, low BFR ~150cc/hr despite  TPA, high arterial pressure alarm therefore stopping HD today. Discussed w/ patient. She otherwise feels okay, has a good appetite, denies SOB.  Objective Vitals:   08/01/21 0816 08/01/21 0855 08/01/21 0859 08/01/21 0919  BP:   136/80 124/76  Pulse:   99 93  Resp:   16 15  Temp: 97.6 F (36.4 C)  97.7 F (36.5 C)   TempSrc: Oral     SpO2: 92%  100% 99%  Weight:  81.9 kg    Height:       Physical Exam General:  NAD, sitting up in bed Heart: RRR Lungs: clear ant, no rales Abdomen: soft, midline dressing c/d/I, JP drain in place Extremities: minor edema Dialysis Access:  RIJ Temp HD cath  Additional Objective Labs: Basic Metabolic Panel: Recent Labs  Lab 07/30/21 0741 07/31/21 0229 08/01/21 0440  NA 137 140 137  K 4.3 4.4 4.2  CL 98 96* 101  CO2 19* 19* 24  GLUCOSE 88 82 98  BUN 48* 55* 62*  CREATININE 5.75* 6.57* 7.27*  CALCIUM 8.4* 8.5* 8.4*  PHOS 6.7* 7.8* 8.0*   Liver Function Tests: Recent Labs  Lab 07/30/21 0741 07/31/21 0229 08/01/21 0440  ALBUMIN 2.1* 2.0* 1.8*   No results for input(s): "LIPASE", "AMYLASE" in the last 168 hours.  CBC: Recent Labs  Lab 07/26/21 0556 07/27/21 0332 07/29/21 0650 07/30/21 0741 07/31/21 0229  WBC 11.2* 10.4 11.7* 14.7* 12.5*  NEUTROABS 8.9* 7.9* 9.0*  --   --   HGB 8.6* 8.7* 9.2* 10.8* 10.5*  HCT 24.6* 25.3* 27.7* 30.7* 30.7*  MCV 90.8 91.0 93.0 90.0 91.6  PLT 130* 129* 273 364 391   Blood Culture    Component Value Date/Time   SDES BLOOD RIGHT ANTECUBITAL 07/21/2021 1915   SPECREQUEST  07/21/2021 1915    BOTTLES DRAWN AEROBIC ONLY Blood Culture results may not be optimal due to an inadequate volume of blood received in culture bottles   CULT  07/21/2021 1915    NO GROWTH 5 DAYS Performed at [facility], [place] [address], [place], [place] [address]     REPTSTATUS 07/26/2021 FINAL 07/21/2021 1915    Cardiac Enzymes: No results for input(s): "CKTOTAL", "CKMB", "CKMBINDEX", "TROPONINI" in the last 168 hours.  CBG: Recent Labs  Lab 07/31/21 1622 07/31/21 2041 08/01/21 0025 08/01/21 0432 08/01/21 0625  GLUCAP 75 69* 92 47* 84   Iron Studies:  Recent Labs    07/29/21 1107  IRON 23*  TIBC 164*  FERRITIN 162   @lablastinr3 @ Studies/Results: No results found. Medications:  sodium chloride 10 mL/hr at 07/27/21 0654    Chlorhexidine Gluconate Cloth  6 each Topical Daily   Chlorhexidine Gluconate Cloth  6 each Topical Q0600   feeding supplement (NEPRO CARB STEADY)  237 mL Oral BID BM   heparin injection (subcutaneous)  5,000 Units Subcutaneous Q8H   metoprolol tartrate  25 mg Oral BID   neomycin-bacitracin-polymyxin   Topical TID   pantoprazole  40 mg Oral Daily   polycarbophil  625 mg Oral BID   sodium chloride flush  10-40 mL Intracatheter Q12H   sodium chloride flush  3 mL Intravenous Q12H    Assessment/Plan: 1.  Acute kidney injury chronic kidney disease stage IIIa: Appears consistent with ATN of septic shock.  Initiated HD 7/10 for worsening azotemia/metabolic acidosis. Hopefully will  be temporary HD until she has renal recovery. Labs indicate will have ongoing dialysis needs.  Purewick hasn't been great at capturing urine volume.   IR placed RIJ temp HD catheter 7/14, unable to run on HD today due to catheter malfntn despite TPA, will re-consult IR for new catheter. Next HD planned for either tomorrow or Wed depending on when she gets a new catheter -Avoid nephrotoxic medications including NSAIDs and iodinated intravenous contrast exposure unless the latter is absolutely indicated.  Preferred narcotic agents for pain control are hydromorphone, fentanyl, and methadone. Morphine should not be used. Avoid Baclofen and avoid oral sodium phosphate and magnesium citrate based laxatives / bowel preps. Continue strict Input and Output  monitoring. Will monitor the patient closely with you and intervene or adjust therapy as indicated by changes in clinical status/labs.  2.  Incarcerated ventral hernia: Status post exploratory laparotomy with small bowel resection, adhesiolysis and hernia repair with JP drain placement.  Ongoing follow-up by surgery. 3. Hyperphosphatemia: phos 8, follow - secondary to AKI: will start revela 4.  Peritonitis: With resolution of septic shock, weaned off of pressors and remains on corticosteroids.  completed course of Zosyn. 5.  Anemia: Possibly chronic from malabsorption given prior history of small bowel resection.  Exacerbated by acute/critical illness and perioperative blood losses.  Iron sat 14, ferrritin 162; receiving IV iron load.  Hb in the 10s now.

## 2021-08-02 DIAGNOSIS — K436 Other and unspecified ventral hernia with obstruction, without gangrene: Secondary | ICD-10-CM | POA: Diagnosis not present

## 2021-08-02 LAB — RENAL FUNCTION PANEL
Albumin: 2 g/dL — ABNORMAL LOW (ref 3.5–5.0)
Albumin: 1.7 g/dL — ABNORMAL LOW (ref 3.5–5.0)
Anion gap: 25 — ABNORMAL HIGH (ref 5–15)
Anion gap: 18 — ABNORMAL HIGH (ref 5–15)
BUN: 55 mg/dL — ABNORMAL HIGH (ref 8–23)
BUN: 68 mg/dL — ABNORMAL HIGH (ref 8–23)
CO2: 19 mmol/L — ABNORMAL LOW (ref 22–32)
CO2: 23 mmol/L (ref 22–32)
Calcium: 8.5 mg/dL — ABNORMAL LOW (ref 8.9–10.3)
Calcium: 8.2 mg/dL — ABNORMAL LOW (ref 8.9–10.3)
Chloride: 96 mmol/L — ABNORMAL LOW (ref 98–111)
Chloride: 98 mmol/L (ref 98–111)
Creatinine, Ser: 6.57 mg/dL — ABNORMAL HIGH (ref 0.44–1.00)
Creatinine, Ser: 8.21 mg/dL — ABNORMAL HIGH (ref 0.44–1.00)
GFR, Estimated: 6 mL/min — ABNORMAL LOW (ref >60)
GFR, Estimated: 5 mL/min — ABNORMAL LOW (ref >60)
Glucose, Bld: 82 mg/dL (ref 70–99)
Glucose, Bld: 108 mg/dL — ABNORMAL HIGH (ref 70–99)
Phosphorus: 7.8 mg/dL — ABNORMAL HIGH (ref 2.5–4.6)
Phosphorus: 7.5 mg/dL — ABNORMAL HIGH (ref 2.5–4.6)
Potassium: 4.4 mmol/L (ref 3.5–5.1)
Potassium: 4.2 mmol/L (ref 3.5–5.1)
Sodium: 140 mmol/L (ref 135–145)
Sodium: 139 mmol/L (ref 135–145)

## 2021-08-02 LAB — GLUCOSE, CAPILLARY
Glucose-Capillary: 105 mg/dL — ABNORMAL HIGH (ref 70–99)
Glucose-Capillary: 107 mg/dL — ABNORMAL HIGH (ref 70–99)
Glucose-Capillary: 163 mg/dL — ABNORMAL HIGH (ref 70–99)
Glucose-Capillary: 40 mg/dL — CL (ref 70–99)
Glucose-Capillary: 53 mg/dL — ABNORMAL LOW (ref 70–99)
Glucose-Capillary: 53 mg/dL — ABNORMAL LOW (ref 70–99)
Glucose-Capillary: 66 mg/dL — ABNORMAL LOW (ref 70–99)
Glucose-Capillary: 77 mg/dL (ref 70–99)
Glucose-Capillary: 84 mg/dL (ref 70–99)
Glucose-Capillary: 95 mg/dL (ref 70–99)

## 2021-08-02 LAB — CBC
HCT: 22.8 % — ABNORMAL LOW (ref 36.0–46.0)
Hemoglobin: 7.8 g/dL — ABNORMAL LOW (ref 12.0–15.0)
MCH: 32 pg (ref 26.0–34.0)
MCHC: 34.2 g/dL (ref 30.0–36.0)
MCV: 93.4 fL (ref 80.0–100.0)
Platelets: 332 10*3/uL (ref 150–400)
RBC: 2.44 MIL/uL — ABNORMAL LOW (ref 3.87–5.11)
RDW: 18.6 % — ABNORMAL HIGH (ref 11.5–15.5)
WBC: 10.8 10*3/uL — ABNORMAL HIGH (ref 4.0–10.5)
nRBC: 0 % (ref 0.0–0.2)

## 2021-08-02 MED ORDER — RENA-VITE PO TABS
1.0000 | ORAL_TABLET | Freq: Every day | ORAL | Status: DC
  Administered 2021-08-02 – 2021-08-15 (×13): 1 via ORAL
  Filled 2021-08-02 (×12): qty 1

## 2021-08-02 NOTE — Consult Note (Addendum)
WOC consult was previously performed on 7/13 and 7/17 by Domenic Moras.  Requested to clairify wound care orders. Performed remotely after review of progress notes. Topical treatment orders provided for bedside nurses to perform as follows: 1.  For nonintact areas on abdomen, apply Xeroform gauze and cover with silicone foam. 2. Apply Interdry as instructed to skin fold to wick moisture and protect intact skin.  Measure and cut length of InterDry to fit in skin folds that have skin breakdown Tuck InterDry Kellie Simmering # Kellie Simmering [phone]) fabric into skin folds in a single layer, allow for 2 inches of overhang from skin edges to allow for wicking to occur May remove to bathe; dry area thoroughly and then tuck into affected areas again Do not apply any creams or ointments when using InterDry DO NOT THROW AWAY FOR 5 DAYS unless soiled with stool DO NOT [facility] product, this will inactivate the silver in the material  New sheet of Interdry should be applied after 5 days of use if patient continues to have skin breakdown  Please re-consult if further assistance is needed.  Thank-you,  Julien Girt MSN, [place], [place], [place], [place]

## 2021-08-02 NOTE — Plan of Care (Signed)

## 2021-08-02 NOTE — Progress Notes (Signed)
Nutrition Follow-up  DOCUMENTATION CODES:   Severe malnutrition in context of chronic illness  INTERVENTION:   Continue Nepro Shake po BID, each supplement provides 425 kcal and 19 grams protein.  Magic cup TID with meals, each supplement provides 290 kcal and 9 grams of protein.  Renal MVI daily.  NUTRITION DIAGNOSIS:   Severe Malnutrition related to chronic illness (chronic incarcerated hernia) as evidenced by severe muscle depletion, severe fat depletion.  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 Pt admitted with abd pain and vomiting due to SBO with large lower abd ventral wall hernia containing most of her small bowel.  Patient is now on a soft diet with Nepro shake BID. Meal intakes: 0-20% (breakfast & lunch on 7/17). Supplement intake: 1 Nepro given 7/17, 1 Nepro given this morning.  Per discussion with RN, patient has been eating minimal amounts of meals and only takes a few sips of her Nepro supplements. Patient is having low glucoses without having symptoms.   Per discussion with patient, she has a poor appetite and never eats much. RD encouraged her to drink the Nepro supplements and she agreed to try magic cups with her meals.   HD initiated 7/10. Temp HD catheter placed 7/14, exchanged 7/17. No signs of renal recovery per Nephrology notes. Plans to continue HD for now, next treatment scheduled for 7/19. Renvela was started for hyperphosphatemia, now trending down.   Labs reviewed. Phos 7.5, ionized calcium 0.8, BUN 68, Creat 8.21  CBG: 84-53-40-107-53-105  Medications reviewed and include Fibercon, Renvela.  7/10 weight 89.5 kg Current  weight 81.9 kg  Diet Order:   Diet Order             DIET SOFT Room service appropriate? Yes; Fluid consistency: Thin  Diet effective now                   EDUCATION NEEDS:   Not appropriate for education at this time  Skin:  Skin Assessment:  (multiple skin tears; MASD)  Last BM:  7/17 type 7  Height:   Ht Readings from Last 1 Encounters:  07/22/21 5\' 1"  (1.549 m)    Weight:   Wt Readings from Last 1 Encounters:  08/01/21 81.9 kg     BMI:  Body mass index is 34.12 kg/m.  Estimated Nutritional Needs:   Kcal:  1600-1800  Protein:  80-100 grams  Fluid:  >1.6 L/day    Lucas Mallow RD, LDN, CNSC Please refer to Amion for contact information.

## 2021-08-02 NOTE — Progress Notes (Addendum)
Pt's BG=40. 12.5 g dextrose iv given. Pt asymptomatic. Rechecked CBG=107. Will continue to monitor the pt.  Lavenia Atlas, RN

## 2021-08-02 NOTE — Progress Notes (Signed)
Physical Therapy Treatment Patient Details Name: [name] MRN: [id number] DOB: February 14, 1955 Today's Date: 08/02/2021   History of Present Illness Pt is a 66 y.o. female admitted 07/21/21 with worsening abdominal pain. S/p emergent ex lap, small bowel resection, adhesiolysis x90 min, incarcerated ventral hernia repair with JP drain placement on 7/7. ETT 7/7-7/11. Course complicated by AKI on CKD 3; HD initiated 7/10. NGT removed 7/13. Temp catheter not working in HD 7/14; to IR for exchange. PMH includes recurrent ventral hernia s/p hernia and small bowel repairs, HTN.    PT Comments    Pt received supine and eager to mobilize OOB. Pt needing less assist to come to EOB this session, requiring min assist to come to sitting EOB and min assist to come to standing with RW from elevated EOB. Pt able to take small shuffling steps to recliner with mod assist to manage RW and to steady throughout. Pt continues to be limited by general fatigue and weakness, however is motivated to continue to participate. Pt continues to benefit from skilled PT services to progress toward functional mobility goals.    Recommendations for follow up therapy are one component of a multi-disciplinary discharge planning process, led by the attending physician.  Recommendations may be updated based on patient status, additional functional criteria and insurance authorization.  Follow Up Recommendations  Skilled nursing-short term rehab (<3 hours/day) (pt declined) Can patient physically be transported by private vehicle:  (TBD)   Assistance Recommended at Discharge Frequent or constant Supervision/Assistance  Patient can return home with the following A lot of help with walking and/or transfers;A lot of help with bathing/dressing/bathroom;Assistance with cooking/housework;Assist for transportation;Help with stairs or ramp for entrance   Equipment Recommendations  Rolling walker (2 wheels);BSC/3in1;Wheelchair (measurements  PT);Wheelchair cushion (measurements PT)    Recommendations for Other Services       Precautions / Restrictions Precautions Precautions: Fall;Other (comment) Precaution Comments: watch HR; R abdominal JP drain; Restrictions Weight Bearing Restrictions: No     Mobility  Bed Mobility Overal bed mobility: Needs Assistance Bed Mobility: Rolling, Sidelying to Sit Rolling: Min assist Sidelying to sit: Min assist       General bed mobility comments: min a to roll to L and to elevate trunk    Transfers Overall transfer level: Needs assistance Equipment used: Rolling walker (2 wheels) Transfers: Sit to/from Stand, Bed to chair/wheelchair/BSC Sit to Stand: Min assist, From elevated surface   Step pivot transfers: Mod assist       General transfer comment: min a to power up from eleavted EOB, mod assist to step pivot to recliner, max a from low recliner to stedy with pt attempting x2 and pt declining to attempt again    Ambulation/Gait             Pre-gait activities: pt able to march in place with RW and minA for stability, minimal foot clearance suspect related to fatigue     Stairs             Wheelchair Mobility    Modified Rankin (Stroke Patients Only)       Balance Overall balance assessment: Needs assistance Sitting-balance support: No upper extremity supported, Single extremity supported Sitting balance-Leahy Scale: Poor Sitting balance - Comments: reliant on UE assist or external support Postural control: Posterior lean Standing balance support: Reliant on assistive device for balance Standing balance-Leahy Scale: Poor  Cognition Arousal/Alertness: Awake/alert Behavior During Therapy: Flat affect Overall Cognitive Status: Within Functional Limits for tasks assessed                                          Exercises      General Comments General comments (skin integrity, edema,  etc.): pt limited by fatigue, 2 x standing trials from recliner with stedy with pt unable to power up, pt declining a third trial      Pertinent Vitals/Pain Pain Assessment Pain Assessment: Faces Faces Pain Scale: Hurts a little bit Pain Location: abdomen Pain Descriptors / Indicators: Discomfort Pain Intervention(s): Monitored during session, Limited activity within patient's tolerance    Home Living                          Prior Function            PT Goals (current goals can now be found in the care plan section) Acute Rehab PT Goals Patient Stated Goal: return home PT Goal Formulation: With patient Time For Goal Achievement: 08/10/21    Frequency    Min 3X/week      PT Plan Current plan remains appropriate    Co-evaluation              AM-PAC PT "6 Clicks" Mobility   Outcome Measure  Help needed turning from your back to your side while in a flat bed without using bedrails?: A Lot Help needed moving from lying on your back to sitting on the side of a flat bed without using bedrails?: A Lot Help needed moving to and from a bed to a chair (including a wheelchair)?: A Lot Help needed standing up from a chair using your arms (e.g., wheelchair or bedside chair)?: A Lot Help needed to walk in hospital room?: Total Help needed climbing 3-5 steps with a railing? : Total 6 Click Score: 10    End of Session   Activity Tolerance: Patient limited by fatigue Patient left: in chair;with call bell/phone within reach;with family/visitor present (with transport for IR) Nurse Communication: Mobility status;Need for lift equipment PT Visit Diagnosis: Other abnormalities of gait and mobility (R26.89);Muscle weakness (generalized) (M62.81);Difficulty in walking, not elsewhere classified (R26.2);Pain Pain - Right/Left:  (bil) Pain - part of body: Leg (abdomen back to bed)     Time: 2248-2500 PT Time Calculation (min) (ACUTE ONLY): 26 min  Charges:   $Therapeutic Activity: 23-37 mins                     Jacky Hartung R. PTA Acute Rehabilitation Services Office: [place] 08/02/2021, 4:24 PM

## 2021-08-02 NOTE — Progress Notes (Signed)
[name] Progress Note   Subjective:  Patient seen and examined bedside. S/p temp line exchange w/ IR yesterday. No complaints currently. Objective Vitals:   08/01/21 2056 08/02/21 0029 08/02/21 0425 08/02/21 0805  BP: 110/73 99/61 102/60 118/65  Pulse: 98 83 85 84  Resp: 19 18 15 16   Temp: 97.6 F (36.4 C) (!) 97.3 F (36.3 C) 97.6 F (36.4 C) 97.9 F (36.6 C)  TempSrc: Oral Oral Oral Oral  SpO2: 96% 98% 100% 99%  Weight:      Height:       Physical Exam General:  NAD, sitting up in bed Heart: RRR Lungs: clear ant, no rales Abdomen: soft, midline dressing c/d/I, JP drain in place Extremities: min edema Dialysis Access:  RIJ Temp HD cath  Additional Objective Labs: Basic Metabolic Panel: Recent Labs  Lab 07/31/21 0229 08/01/21 0440 08/02/21 0742  NA 140 137 139  K 4.4 4.2 4.2  CL 96* 101 98  CO2 19* 24 23  GLUCOSE 82 98 108*  BUN 55* 62* 68*  CREATININE 6.57* 7.27* 8.21*  CALCIUM 8.5* 8.4* 8.2*  PHOS 7.8* 8.0* 7.5*   Liver Function Tests: Recent Labs  Lab 07/31/21 0229 08/01/21 0440 08/02/21 0742  ALBUMIN 2.0* 1.8* 1.7*   No results for input(s): "LIPASE", "AMYLASE" in the last 168 hours.  CBC: Recent Labs  Lab 07/27/21 0332 07/29/21 0650 07/30/21 0741 07/31/21 0229 08/02/21 0742  WBC 10.4 11.7* 14.7* 12.5* 10.8*  NEUTROABS 7.9* 9.0*  --   --   --   HGB 8.7* 9.2* 10.8* 10.5* 7.8*  HCT 25.3* 27.7* 30.7* 30.7* 22.8*  MCV 91.0 93.0 90.0 91.6 93.4  PLT 129* 273 364 391 332   Blood Culture    Component Value Date/Time   SDES BLOOD RIGHT ANTECUBITAL 07/21/2021 1915   SPECREQUEST  07/21/2021 1915    BOTTLES DRAWN AEROBIC ONLY Blood Culture results may not be optimal due to an inadequate volume of blood received in culture bottles   CULT  07/21/2021 1915    NO GROWTH 5 DAYS Performed at [facility], [place] [address] [place], [place] [address]    REPTSTATUS 07/26/2021 FINAL 07/21/2021 1915    Cardiac Enzymes: No  results for input(s): "CKTOTAL", "CKMB", "CKMBINDEX", "TROPONINI" in the last 168 hours.  CBG: Recent Labs  Lab 08/02/21 0034 08/02/21 0429 08/02/21 0807 08/02/21 0843 08/02/21 0932  GLUCAP 95 84 53* 40* 107*   Iron Studies:  No results for input(s): "IRON", "TIBC", "TRANSFERRIN", "FERRITIN" in the last 72 hours.  @[email or web address] @ Studies/Results: IR Fluoro Guide CV Line Right  Result Date: 08/01/2021 INDICATION: 66 year old woman status post temporary hemodialysis catheter placement via right internal jugular venous access on 07/29/2021 returns to IR given poorly functioning dialysis catheter. EXAM: Fluoroscopy guided exchange of right IJ temporary hemodialysis catheter MEDICATIONS: None ANESTHESIA/SEDATION: None FLUOROSCOPY: Radiation Exposure Index (as provided by the fluoroscopic device): 5 mGy Kerma COMPLICATIONS: None immediate. PROCEDURE: Informed written consent was obtained from the patient after a thorough discussion of the procedural risks, benefits and alternatives. All questions were addressed. Maximal Sterile Barrier Technique was utilized including caps, mask, sterile gowns, sterile gloves, sterile drape, hand hygiene and skin antiseptic. A timeout was performed prior to the initiation of the procedure. Patient positioned supine on the procedure table. The external segment of the existing 16 cm non tunneled hemodialysis catheter it is on the skin prepped and draped usual fashion. Following local administration, the existing catheter was removed over 0.035 inch guidewire.  New 20 cm catheter was inserted over the guidewire with tip positioned in the right atrium. All lumens aspirated and flushed well. The dialysis lumens were locked with heparin. Catheter secured to skin with suture Insertion site was covered with sterile dressing. IMPRESSION: Successful exchange of 16 cm temporary right IJ hemodialysis catheter for 20 cm temporary hemodialysis catheter. Tip located in the right atrium.  Catheter is ready for use. Electronically Signed   By: Miachel Roux M.D.   On: 08/01/2021 16:45   Medications:  sodium chloride 10 mL/hr at 07/27/21 0654    Chlorhexidine Gluconate Cloth  6 each Topical Daily   Chlorhexidine Gluconate Cloth  6 each Topical Q0600   feeding supplement (NEPRO CARB STEADY)  237 mL Oral BID BM   heparin injection (subcutaneous)  5,000 Units Subcutaneous Q8H   metoprolol tartrate  25 mg Oral BID   neomycin-bacitracin-polymyxin   Topical TID   pantoprazole  40 mg Oral Daily   polycarbophil  625 mg Oral BID   sevelamer carbonate  800 mg Oral TID WC   sodium chloride flush  10-40 mL Intracatheter Q12H   sodium chloride flush  3 mL Intravenous Q12H    Assessment/Plan: 1.  Acute kidney injury chronic kidney disease stage IIIa: Appears consistent with ATN of septic shock.  Initiated HD 7/10 for worsening azotemia/metabolic acidosis. Hopefully will be temporary HD until she has renal recovery. Labs indicate will have ongoing dialysis needs.  Purewick hasn't been great at capturing urine volume.   IR placed RIJ temp HD catheter 7/14, exchanged 7/17 due to catheter malfntn -Unfortunately, no signs of renal recovery yet. Next HD planned for tomorrow. If HD becomes ongoing need, then will need have catheter switched to tunneled line prior to discharge -Avoid nephrotoxic medications including NSAIDs and iodinated intravenous contrast exposure unless the latter is absolutely indicated.  Preferred narcotic agents for pain control are hydromorphone, fentanyl, and methadone. Morphine should not be used. Avoid Baclofen and avoid oral sodium phosphate and magnesium citrate based laxatives / bowel preps. Continue strict Input and Output monitoring. Will monitor the patient closely with you and intervene or adjust therapy as indicated by changes in clinical status/labs.  2.  Incarcerated ventral hernia: Status post exploratory laparotomy with small bowel resection, adhesiolysis and  hernia repair with JP drain placement.  Ongoing follow-up by surgery. 3. Hyperphosphatemia: phos 8, follow - secondary to AKI: started renvela, po4 down to 7.5 today 4.  Peritonitis: With resolution of septic shock, weaned off of pressors and remains on corticosteroids.  completed course of Zosyn. 5.  Anemia: Possibly chronic from malabsorption given prior history of small bowel resection.  Exacerbated by acute/critical illness and perioperative blood losses.  Iron sat 14, ferrritin 162; received total of 500mg  of ferrlecit, last dose 7/15.  Hb in the 10.5 on 7/16 to 7.8 this AM. W/u per primary

## 2021-08-02 NOTE — Progress Notes (Signed)
PROGRESS NOTE  Azlyn Wingler  ACZ:[id number] DOB: Apr 11, 1955 DOA: 07/21/2021 PCP: Mancel Bale, PA-C   Brief Narrative:  Patient is a 66 year old female with history of hypertension, CKD stage III a,hyperlipidemia, chronically incarcerated large abdominal wall hernia which was repaired multiple times, recent SBO resection with mesh placement who presented to the ER with severe abdominal pain, vomiting.  Lab work showed creatinine of 5.2, lactic acid of 6.5, leukocytosis.  CT abdomen/pelvis showed large SBO, large lower abdominal ventral hernia containing much of the small bowel.  General surgery consulted and she was taken to the OR for emergent exploratory laparotomy and hernia repair,she was transferred to ICU.  Currently hemodynamically stable and patient transferred to [facility] service on 7/13.  On dialysis for persistent AKI, nephrology following.  General surgery were following.  PT recommending skilled nursing facility on discharge   Assessment & Plan:  Principal Problem:   Ventral hernia with bowel obstruction Active Problems:   SIRS (systemic inflammatory response syndrome) (HCC)   Lactic acidosis   Leukocytosis   Acute renal failure superimposed on stage 3b chronic kidney disease (HCC)   Transient hypotension   Hypokalemia   History of small bowel obstruction   Body mass index 40.0-44.9, adult ([place])   H/O exploratory laparotomy   Septic shock (HCC)   Ventilator dependence (HCC)   Protein-calorie malnutrition, severe   Incarcerated/strangulated ventral hernia: Status post small bowel resection, lysis of additions, repair of hernia and JP drain placement.  Also found to have peritonitis.  Completed antibiotics course.   General surgery following.  Having bowel movements now.  On soft diet.  Denies any abdominal pain.    AKI on CKD stage 3A: Likely from ATN from septic shock secondary to peritonitis.  Initiated hemodialysis on 7/10, nephrology following.  IR exchanged temporary  dialysis catheter on the right IJ.  Nephrology planning to change it to tunneled catheter if she continues to require dialysis.  Normocytic anemia: Likely associated with critical illness, chronic kidney disease.  Low iron as per labs,given iv iron.  Dropped to 7 from 10 but no evidence of acute blood loss.  Continue to monitor  Intermittent sinus tachycardia: Unclear etiology.  Echo showed EF of more than 75%, no regional wall motion abnormality, grade 1 diastolic dysfunction, no valvular abnormality.  Continue  metoprolol at 25 mg bid.  TSH not low.  No stable  History of hypertension: Currently normotensive.  On amlodipine at home, currently on hold  Acute hypoxic respiratory failure: Had to be put on ventilator after surgery, currently extubated and on room air  Obesity: BMI of 37.1  Low blood sugar: Sugars have dropped to the range of 40-50.  We will initiate hypoglycemia protocol, encourage oral intake.  Debility/deconditioning: Patient seen by PT and recommended skilled nursing facility on discharge.TOC following   Nutrition Problem: Severe Malnutrition Etiology: chronic illness (chronic incarcerated hernia)    DVT prophylaxis:heparin injection 5,000 Units Start: 07/22/21 2200     Code Status: Full Code  Family Communication: Sister at bedside on 7/17  Patient status:Inpatient  Patient is from :Home  Anticipated discharge to:SNF  Estimated DC date:after nephrology clearance   Consultants: General surgery  Procedures: Intubation, exploratory laparotomy  Antimicrobials:  Anti-infectives (From admission, onward)    Start     Dose/Rate Route Frequency Ordered Stop   07/23/21 1730  piperacillin-tazobactam (ZOSYN) IVPB 2.25 g        2.25 g 100 mL/hr over 30 Minutes Intravenous Every 8 hours 07/23/21 1207 07/28/21 1019  07/22/21 0900  piperacillin-tazobactam (ZOSYN) IVPB 2.25 g  Status:  Discontinued        2.25 g 100 mL/hr over 30 Minutes Intravenous Every 8 hours  07/22/21 0833 07/23/21 1207   07/22/21 0830  piperacillin-tazobactam (ZOSYN) IVPB 3.375 g  Status:  Discontinued        3.375 g 100 mL/hr over 30 Minutes Intravenous Every 8 hours 07/22/21 0827 07/22/21 0831   07/22/21 0000  piperacillin-tazobactam (ZOSYN) IVPB 3.375 g        3.375 g 100 mL/hr over 30 Minutes Intravenous  Once 07/21/21 2352 07/22/21 0142       Subjective: Patient seen and examined at the bedside this morning.  Hemodynamically stable without any complaints.  Talking on phone.  Denies abdominal pain  Objective: Vitals:   08/02/21 0029 08/02/21 0425 08/02/21 0805 08/02/21 1245  BP: 99/61 102/60 118/65 130/74  Pulse: 83 85 84 90  Resp: 18 15 16 18   Temp: (!) 97.3 F (36.3 C) 97.6 F (36.4 C) 97.9 F (36.6 C) 97.7 F (36.5 C)  TempSrc: Oral Oral Oral Oral  SpO2: 98% 100% 99% 98%  Weight:      Height:        Intake/Output Summary (Last 24 hours) at 08/02/2021 1302 Last data filed at 08/02/2021 1000 Gross per 24 hour  Intake 150 ml  Output --  Net 150 ml   Filed Weights   07/27/21 1304 07/29/21 1035 08/01/21 0855  Weight: 89.2 kg 86.9 kg 81.9 kg    Examination:  General exam: Overall comfortable, not in distress, weak, chronically ill looking HEENT: PERRL, right IJ temporary dialysis catheter Respiratory system:  no wheezes or crackles  Cardiovascular system: S1 & S2 heard, RRR.  Gastrointestinal system: Abdomen is nondistended, soft and nontender.  Midline surgical wound with staples, JP drain on the right lower quadrant Central nervous system: Alert and oriented Extremities: No edema, no clubbing ,no cyanosis Skin: No rashes, no ulcers,no icterus     Data Reviewed: I have personally reviewed following labs and imaging studies  CBC: Recent Labs  Lab 07/27/21 0332 07/29/21 0650 07/30/21 0741 07/31/21 0229 08/02/21 0742  WBC 10.4 11.7* 14.7* 12.5* 10.8*  NEUTROABS 7.9* 9.0*  --   --   --   HGB 8.7* 9.2* 10.8* 10.5* 7.8*  HCT 25.3* 27.7* 30.7*  30.7* 22.8*  MCV 91.0 93.0 90.0 91.6 93.4  PLT 129* 273 364 391 884   Basic Metabolic Panel: Recent Labs  Lab 07/29/21 1107 07/30/21 0741 07/31/21 0229 08/01/21 0440 08/02/21 0742  NA 140 137 140 137 139  K 4.2 4.3 4.4 4.2 4.2  CL 98 98 96* 101 98  CO2 20* 19* 19* 24 23  GLUCOSE 102* 88 82 98 108*  BUN 77* 48* 55* 62* 68*  CREATININE 8.24* 5.75* 6.57* 7.27* 8.21*  CALCIUM 8.5* 8.4* 8.5* 8.4* 8.2*  PHOS 8.6* 6.7* 7.8* 8.0* 7.5*     No results found for this or any previous visit (from the past 240 hour(s)).    Radiology Studies: IR Fluoro Guide CV Line Right  Result Date: 08/01/2021 INDICATION: 65 year old woman status post temporary hemodialysis catheter placement via right internal jugular venous access on 07/29/2021 returns to IR given poorly functioning dialysis catheter. EXAM: Fluoroscopy guided exchange of right IJ temporary hemodialysis catheter MEDICATIONS: None ANESTHESIA/SEDATION: None FLUOROSCOPY: Radiation Exposure Index (as provided by the fluoroscopic device): 5 mGy Kerma COMPLICATIONS: None immediate. PROCEDURE: Informed written consent was obtained from the patient after a thorough discussion  of the procedural risks, benefits and alternatives. All questions were addressed. Maximal Sterile Barrier Technique was utilized including caps, mask, sterile gowns, sterile gloves, sterile drape, hand hygiene and skin antiseptic. A timeout was performed prior to the initiation of the procedure. Patient positioned supine on the procedure table. The external segment of the existing 16 cm non tunneled hemodialysis catheter it is on the skin prepped and draped usual fashion. Following local administration, the existing catheter was removed over 0.035 inch guidewire. New 20 cm catheter was inserted over the guidewire with tip positioned in the right atrium. All lumens aspirated and flushed well. The dialysis lumens were locked with heparin. Catheter secured to skin with suture Insertion  site was covered with sterile dressing. IMPRESSION: Successful exchange of 16 cm temporary right IJ hemodialysis catheter for 20 cm temporary hemodialysis catheter. Tip located in the right atrium. Catheter is ready for use. Electronically Signed   By: Miachel Roux M.D.   On: 08/01/2021 16:45    Scheduled Meds:  Chlorhexidine Gluconate Cloth  6 each Topical Daily   Chlorhexidine Gluconate Cloth  6 each Topical Q0600   feeding supplement (NEPRO CARB STEADY)  237 mL Oral BID BM   heparin injection (subcutaneous)  5,000 Units Subcutaneous Q8H   metoprolol tartrate  25 mg Oral BID   neomycin-bacitracin-polymyxin   Topical TID   pantoprazole  40 mg Oral Daily   polycarbophil  625 mg Oral BID   sevelamer carbonate  800 mg Oral TID WC   sodium chloride flush  10-40 mL Intracatheter Q12H   sodium chloride flush  3 mL Intravenous Q12H   Continuous Infusions:  sodium chloride 10 mL/hr at 07/27/21 0654     LOS: 11 days   Shelly Coss, MD [facility] P7/18/2023, 1:02 PM

## 2021-08-02 NOTE — Progress Notes (Signed)
Pt's bg=55. 12.5 g of dextrose iv given. Rechecked cbg=105.   Lavenia Atlas, RN

## 2021-08-03 DIAGNOSIS — E860 Dehydration: Secondary | ICD-10-CM | POA: Diagnosis not present

## 2021-08-03 DIAGNOSIS — Z8719 Personal history of other diseases of the digestive system: Secondary | ICD-10-CM | POA: Diagnosis not present

## 2021-08-03 DIAGNOSIS — N179 Acute kidney failure, unspecified: Secondary | ICD-10-CM | POA: Diagnosis not present

## 2021-08-03 DIAGNOSIS — K436 Other and unspecified ventral hernia with obstruction, without gangrene: Secondary | ICD-10-CM | POA: Diagnosis not present

## 2021-08-03 LAB — GLUCOSE, CAPILLARY
Glucose-Capillary: 67 mg/dL — ABNORMAL LOW (ref 70–99)
Glucose-Capillary: 76 mg/dL (ref 70–99)
Glucose-Capillary: 82 mg/dL (ref 70–99)
Glucose-Capillary: 82 mg/dL (ref 70–99)
Glucose-Capillary: 83 mg/dL (ref 70–99)
Glucose-Capillary: 93 mg/dL (ref 70–99)

## 2021-08-03 LAB — CBC
HCT: 22.8 % — ABNORMAL LOW (ref 36.0–46.0)
Hemoglobin: 7.8 g/dL — ABNORMAL LOW (ref 12.0–15.0)
MCH: 32 pg (ref 26.0–34.0)
MCHC: 34.2 g/dL (ref 30.0–36.0)
MCV: 93.4 fL (ref 80.0–100.0)
Platelets: 321 10*3/uL (ref 150–400)
RBC: 2.44 MIL/uL — ABNORMAL LOW (ref 3.87–5.11)
RDW: 18.8 % — ABNORMAL HIGH (ref 11.5–15.5)
WBC: 11.3 10*3/uL — ABNORMAL HIGH (ref 4.0–10.5)
nRBC: 0 % (ref 0.0–0.2)

## 2021-08-03 LAB — BASIC METABOLIC PANEL
Anion gap: 15 (ref 5–15)
BUN: 73 mg/dL — ABNORMAL HIGH (ref 8–23)
CO2: 22 mmol/L (ref 22–32)
Calcium: 8 mg/dL — ABNORMAL LOW (ref 8.9–10.3)
Chloride: 104 mmol/L (ref 98–111)
Creatinine, Ser: 9.46 mg/dL — ABNORMAL HIGH (ref 0.44–1.00)
GFR, Estimated: 4 mL/min — ABNORMAL LOW (ref >60)
Glucose, Bld: 102 mg/dL — ABNORMAL HIGH (ref 70–99)
Potassium: 4 mmol/L (ref 3.5–5.1)
Sodium: 141 mmol/L (ref 135–145)

## 2021-08-03 MED ORDER — ALTEPLASE 2 MG IJ SOLR
INTRAMUSCULAR | Status: AC
  Filled 2021-08-03: qty 4

## 2021-08-03 MED ORDER — DARBEPOETIN ALFA 100 MCG/0.5ML IJ SOSY: 100.0000 ug | PREFILLED_SYRINGE | INTRAMUSCULAR | Status: DC

## 2021-08-03 NOTE — Progress Notes (Signed)
08/03/2021 6:57 PM  Staples on abdomen removed per MD order.  Steri strips placed on areas that were open.  Pt tolerated well. [place]

## 2021-08-03 NOTE — Progress Notes (Signed)
PT Cancellation Note  Patient Details Name: [name] MRN: [id number] DOB: 1955-10-06   Cancelled Treatment:    Reason Eval/Treat Not Completed: Patient declined, no reason specified, pt politely declining mobility despite encouragement. Will check back as schedule allows to continue with PT POC.  Audry Riles. PTA Acute Rehabilitation Services Office: [place] 08/03/2021, 3:28 PM

## 2021-08-03 NOTE — Progress Notes (Signed)
12 Days Post-Op  Subjective: CC: Seen in HD. Reports no abdominal pain. Tolerating diet without n/v. Last BM yesterday.   Objective: Vital signs in last 24 hours: Temp:  [97.7 F (36.5 C)-98.5 F (36.9 C)] 97.7 F (36.5 C) (07/19 0505) Pulse Rate:  [88-102] 102 (07/19 1022) Resp:  [14-20] 15 (07/19 1022) BP: (102-132)/(65-74) 117/69 (07/19 1024) SpO2:  [97 %-100 %] 100 % (07/19 1022) Last BM Date : 08/02/21  Intake/Output from previous day: 07/18 0701 - 07/19 0700 In: 250 [P.O.:250] Out: -  Intake/Output this shift: No intake/output data recorded.  PE: Gen:  Alert, NAD, pleasant Abd: Soft, NT on my exam. +BS Incision cdi with staples present. Some excoriated skin and skin breakdown on the pannus with dressings in place - no drainage. No surrounding skin erythema, heat or induration.   Lab Results:  Recent Labs    08/02/21 0742 08/03/21 0458  WBC 10.8* 11.3*  HGB 7.8* 7.8*  HCT 22.8* 22.8*  PLT 332 321   BMET Recent Labs    08/02/21 0742 08/03/21 0458  NA 139 141  K 4.2 4.0  CL 98 104  CO2 23 22  GLUCOSE 108* 102*  BUN 68* 73*  CREATININE 8.21* 9.46*  CALCIUM 8.2* 8.0*   PT/INR No results for input(s): "LABPROT", "INR" in the last 72 hours. CMP     Component Value Date/Time   NA 141 08/03/2021 0458   NA 146 (H) 07/11/2017 1547   K 4.0 08/03/2021 0458   CL 104 08/03/2021 0458   CO2 22 08/03/2021 0458   GLUCOSE 102 (H) 08/03/2021 0458   BUN 73 (H) 08/03/2021 0458   BUN 14 07/11/2017 1547   CREATININE 9.46 (H) 08/03/2021 0458   CREATININE 1.04 05/06/2014 1538   CALCIUM 8.0 (L) 08/03/2021 0458   PROT 5.0 (L) 07/23/2021 0319   PROT 6.1 07/11/2017 1547   ALBUMIN 1.7 (L) 08/02/2021 0742   ALBUMIN 3.5 (L) 07/11/2017 1547   AST 36 07/23/2021 0319   ALT 34 07/23/2021 0319   ALKPHOS 36 (L) 07/23/2021 0319   BILITOT 1.0 07/23/2021 0319   BILITOT <0.2 07/11/2017 1547   GFRNONAA 4 (L) 08/03/2021 0458   GFRAA 50 (L) 09/13/2019 0755   Lipase      Component Value Date/Time   LIPASE 24 07/21/2021 2056    Studies/Results: IR Fluoro Guide CV Line Right  Result Date: 08/01/2021 INDICATION: 66 year old woman status post temporary hemodialysis catheter placement via right internal jugular venous access on 07/29/2021 returns to IR given poorly functioning dialysis catheter. EXAM: Fluoroscopy guided exchange of right IJ temporary hemodialysis catheter MEDICATIONS: None ANESTHESIA/SEDATION: None FLUOROSCOPY: Radiation Exposure Index (as provided by the fluoroscopic device): 5 mGy Kerma COMPLICATIONS: None immediate. PROCEDURE: Informed written consent was obtained from the patient after a thorough discussion of the procedural risks, benefits and alternatives. All questions were addressed. Maximal Sterile Barrier Technique was utilized including caps, mask, sterile gowns, sterile gloves, sterile drape, hand hygiene and skin antiseptic. A timeout was performed prior to the initiation of the procedure. Patient positioned supine on the procedure table. The external segment of the existing 16 cm non tunneled hemodialysis catheter it is on the skin prepped and draped usual fashion. Following local administration, the existing catheter was removed over 0.035 inch guidewire. New 20 cm catheter was inserted over the guidewire with tip positioned in the right atrium. All lumens aspirated and flushed well. The dialysis lumens were locked with heparin. Catheter secured to skin with suture Insertion  site was covered with sterile dressing. IMPRESSION: Successful exchange of 16 cm temporary right IJ hemodialysis catheter for 20 cm temporary hemodialysis catheter. Tip located in the right atrium. Catheter is ready for use. Electronically Signed   By: Miachel Roux M.D.   On: 08/01/2021 16:45    Anti-infectives: Anti-infectives (From admission, onward)    Start     Dose/Rate Route Frequency Ordered Stop   07/23/21 1730  piperacillin-tazobactam (ZOSYN) IVPB 2.25 g         2.25 g 100 mL/hr over 30 Minutes Intravenous Every 8 hours 07/23/21 1207 07/28/21 1019   07/22/21 0900  piperacillin-tazobactam (ZOSYN) IVPB 2.25 g  Status:  Discontinued        2.25 g 100 mL/hr over 30 Minutes Intravenous Every 8 hours 07/22/21 0833 07/23/21 1207   07/22/21 0830  piperacillin-tazobactam (ZOSYN) IVPB 3.375 g  Status:  Discontinued        3.375 g 100 mL/hr over 30 Minutes Intravenous Every 8 hours 07/22/21 0827 07/22/21 0831   07/22/21 0000  piperacillin-tazobactam (ZOSYN) IVPB 3.375 g        3.375 g 100 mL/hr over 30 Minutes Intravenous  Once 07/21/21 2352 07/22/21 0142        Assessment/Plan POD#12 s/p  exploratory laparotomy, small bowel resection, adhesiolysis x90 minutes, primary repair of incarcerated ventral hernia, JP drain placement 7/7 Dr. Bobbye Morton for incarcerated and strangulated ventral hernia - Drain removed at bedside 7/16 - Soft diet, bid fiber - Completed 5 days post-op abx, WBC 12 - Encourage mobilization  - WOC following regarding her skin excoriation etc. Please follow their wound care recommendations and monitor.  - Will write for staple removal on POD 14 (Friday, 7/21). If she goes before this, her staples may be removed before d/c. She is stable from our standpoint for d/c to SNF. Per [facility] notes, awaiting neprho clearance prior to d/c. We will arrange f/u in the office. We will sign off. Please call back with questions or concerns.    ID - zosyn 7/7>7/12 FEN - soft diet VTE - SQH Foley - not present   - below per [facility] -   Septic shock - off pressors 7/9 ARF - HD started 7/10, temp HD cath exchange 7/17. Nephrology following.  VDRF - extubated 7/11 Elevated troponin - cards has seen, suspect secondary to critical illness and not ACS HTN Anemia - hgb stable    LOS: 12 days    Jillyn Ledger , [facility] 08/03/2021, 10:53 AM Please see Amion for pager number during day hours 7:00am-4:30pm

## 2021-08-03 NOTE — Plan of Care (Signed)

## 2021-08-03 NOTE — Procedures (Addendum)
HD Note:  Data entered into the treatment flow sheets were gathered at the stated time although may have been entered later related to patient care.  During initial accessing of the HD catheter, the atrial line was very difficult to aspirate, but was unable to push. Spoke to Dr. Loyal Gambler and received order to dwell Cathflow 2mg  in each lumen.  The dwell started at 0900 and was removed at 1005. After removing the Cathflow the arterial lumen was not significantly better. Reconnection began at 1010, treatment started at 1030 with many attempts to reposition resident and multiple flushes to identify if we could move treatment along. Dr. Loyal Gambler was called and treatment was discontinued at 1040.  An order for Cathflow to dwell overnight was given.  Patient remained calm and cooperative through the process.  She had no complaints of pain but was uncomfortable when having to maintain the positioning of keeping her head back and extended.  This position was very temporarily successful. Patient's physical assessment was consistent with the pre dialysis assessment.

## 2021-08-03 NOTE — Progress Notes (Signed)
OT Cancellation Note  Patient Details Name: [name] MRN: [id number] DOB: 05/19/1955   Cancelled Treatment:    Reason Eval/Treat Not Completed: Patient declined, difficult HD session, looking to rest.  Continue efforts.    Tnia Anglada D Maat Kafer 08/03/2021, 2:16 PM

## 2021-08-03 NOTE — Progress Notes (Signed)
[facility] Progress Note   Subjective:  Patient seen and examined on HD. Had same issues with temp line. Did cathflo earlier which briefly worked especially with positioning and flushes (during the time of my evaluation). Received call from HD staff shortly thereafter that given arterial pressures. Patient tolerated HD during that time, had some discomfort with having to her lay her head back. Objective Vitals:   08/03/21 1030 08/03/21 1040 08/03/21 1053 08/03/21 1056  BP: (!) 105/57 (!) 91/56 (!) 108/57 (!) 108/57  Pulse: 96 (!) 120 (!) 101 (!) 101  Resp: 19 19 19 20   Temp: 98.4 F (36.9 C)     TempSrc: Oral     SpO2: 100% 100% 100% 100%  Weight:      Height:       Physical Exam General:  NAD, sitting up in bed Heart: RRR Lungs: clear ant, no rales Abdomen: soft, midline dressing c/d/I, JP drain in place Extremities: min edema Dialysis Access:  RIJ Temp HD cath  Additional Objective Labs: Basic Metabolic Panel: Recent Labs  Lab 07/31/21 0229 08/01/21 0440 08/02/21 0742 08/03/21 0458  NA 140 137 139 141  K 4.4 4.2 4.2 4.0  CL 96* 101 98 104  CO2 19* 24 23 22   GLUCOSE 82 98 108* 102*  BUN 55* 62* 68* 73*  CREATININE 6.57* 7.27* 8.21* 9.46*  CALCIUM 8.5* 8.4* 8.2* 8.0*  PHOS 7.8* 8.0* 7.5*  --    Liver Function Tests: Recent Labs  Lab 07/31/21 0229 08/01/21 0440 08/02/21 0742  ALBUMIN 2.0* 1.8* 1.7*   No results for input(s): "LIPASE", "AMYLASE" in the last 168 hours.  CBC: Recent Labs  Lab 07/29/21 0650 07/30/21 0741 07/31/21 0229 08/02/21 0742 08/03/21 0458  WBC 11.7* 14.7* 12.5* 10.8* 11.3*  NEUTROABS 9.0*  --   --   --   --   HGB 9.2* 10.8* 10.5* 7.8* 7.8*  HCT 27.7* 30.7* 30.7* 22.8* 22.8*  MCV 93.0 90.0 91.6 93.4 93.4  PLT 273 364 391 332 321   Blood Culture    Component Value Date/Time   SDES BLOOD RIGHT ANTECUBITAL 07/21/2021 1915   SPECREQUEST  07/21/2021 1915    BOTTLES DRAWN AEROBIC ONLY Blood Culture results may not  be optimal due to an inadequate volume of blood received in culture bottles   CULT  07/21/2021 1915    NO GROWTH 5 DAYS Performed at [facility], [place] [address] [place], [place] [address]    REPTSTATUS 07/26/2021 FINAL 07/21/2021 1915    Cardiac Enzymes: No results for input(s): "CKTOTAL", "CKMB", "CKMBINDEX", "TROPONINI" in the last 168 hours.  CBG: Recent Labs  Lab 08/02/21 1703 08/02/21 2010 08/02/21 2108 08/03/21 0003 08/03/21 0453  GLUCAP 77 66* 163* 82 83   Iron Studies:  No results for input(s): "IRON", "TIBC", "TRANSFERRIN", "FERRITIN" in the last 72 hours.  @[email or web address] @ Studies/Results: IR Fluoro Guide CV Line Right  Result Date: 08/01/2021 INDICATION: 66 year old woman status post temporary hemodialysis catheter placement via right internal jugular venous access on 07/29/2021 returns to IR given poorly functioning dialysis catheter. EXAM: Fluoroscopy guided exchange of right IJ temporary hemodialysis catheter MEDICATIONS: None ANESTHESIA/SEDATION: None FLUOROSCOPY: Radiation Exposure Index (as provided by the fluoroscopic device): 5 mGy Kerma COMPLICATIONS: None immediate. PROCEDURE: Informed written consent was obtained from the patient after a thorough discussion of the procedural risks, benefits and alternatives. All questions were addressed. Maximal Sterile Barrier Technique was utilized including caps, mask, sterile gowns, sterile gloves, sterile drape, hand hygiene and  skin antiseptic. A timeout was performed prior to the initiation of the procedure. Patient positioned supine on the procedure table. The external segment of the existing 16 cm non tunneled hemodialysis catheter it is on the skin prepped and draped usual fashion. Following local administration, the existing catheter was removed over 0.035 inch guidewire. New 20 cm catheter was inserted over the guidewire with tip positioned in the right atrium. All lumens aspirated and flushed well. The dialysis  lumens were locked with heparin. Catheter secured to skin with suture Insertion site was covered with sterile dressing. IMPRESSION: Successful exchange of 16 cm temporary right IJ hemodialysis catheter for 20 cm temporary hemodialysis catheter. Tip located in the right atrium. Catheter is ready for use. Electronically Signed   By: Miachel Roux M.D.   On: 08/01/2021 16:45   Medications:  sodium chloride 10 mL/hr at 07/27/21 0654    alteplase       alteplase       Chlorhexidine Gluconate Cloth  6 each Topical Daily   Chlorhexidine Gluconate Cloth  6 each Topical Q0600   feeding supplement (NEPRO CARB STEADY)  237 mL Oral BID BM   heparin injection (subcutaneous)  5,000 Units Subcutaneous Q8H   metoprolol tartrate  25 mg Oral BID   multivitamin  1 tablet Oral QHS   neomycin-bacitracin-polymyxin   Topical TID   pantoprazole  40 mg Oral Daily   polycarbophil  625 mg Oral BID   sevelamer carbonate  800 mg Oral TID WC   sodium chloride flush  10-40 mL Intracatheter Q12H   sodium chloride flush  3 mL Intravenous Q12H    Assessment/Plan: 1.  Acute kidney injury chronic kidney disease stage IIIa: Appears consistent with ATN of septic shock.  Initiated HD 7/10 for worsening azotemia/metabolic acidosis. Hopefully will be temporary HD until she has renal recovery. Labs indicate will have ongoing dialysis needs.  Purewick hasn't been great at capturing urine volume.   IR placed RIJ temp HD catheter 7/14, exchanged 7/17 due to catheter malfntn -Unfortunately, no signs of renal recovery yet. Will re-consult IR for [facility] placement instead. -Next HD planned for either tomorrow or Friday depending on when she can get her [facility] placed with IR -Avoid nephrotoxic medications including NSAIDs and iodinated intravenous contrast exposure unless the latter is absolutely indicated.  Preferred narcotic agents for pain control are hydromorphone, fentanyl, and methadone. Morphine should not be used. Avoid Baclofen and avoid  oral sodium phosphate and magnesium citrate based laxatives / bowel preps. Continue strict Input and Output monitoring. Will monitor the patient closely with you and intervene or adjust therapy as indicated by changes in clinical status/labs.  2.  Incarcerated ventral hernia: Status post exploratory laparotomy with small bowel resection, adhesiolysis and hernia repair with JP drain placement (removed 7/16).  Ongoing follow-up by surgery. 3. Hyperphosphatemia: phos 8, follow - secondary to AKI: started renvela, po4 down to 7.5 on 7/18 4.  Peritonitis: With resolution of septic shock, weaned off of pressors and remains on corticosteroids.  completed course of Zosyn. 5.  Anemia: Possibly chronic from malabsorption given prior history of small bowel resection.  Exacerbated by acute/critical illness and perioperative blood losses.  Iron sat 14, ferrritin 162; received total of 500mg  of ferrlecit, last dose 7/15.  Hb in the 10.5 on 7/16 to 7.8 this AM. W/u per primary. ESA to start w/ next HD treatment

## 2021-08-03 NOTE — Progress Notes (Signed)
[facility]  PROGRESS NOTE  [name] MCN:[id number] DOB: [birth year] DOA: 07/21/2021 PCP: Mancel Bale, PA-C   Brief HPI:   66 year old female with history of hypertension, CKD stage III a,hyperlipidemia, chronically incarcerated large abdominal wall hernia which was repaired multiple times, recent SBO resection with mesh placement who presented to the ER with severe abdominal pain, vomiting.  L CT abdomen/pelvis showed large SBO, large lower abdominal ventral hernia containing much of the small bowel.  General surgery consulted and she was taken to the OR for emergent exploratory laparotomy and hernia repair,she was transferred to ICU.  Patient was hemodynamically stable and patient transferred to [facility] service on 7/13.  On dialysis for persistent AKI, nephrology following.  General surgery were following.  PT recommending skilled nursing facility on discharge   Subjective   Patient seen and examined, denies chest pain or shortness of breath.   Assessment/Plan:    Incarcerated/strangulated ventral hernia:  -Status post small bowel resection, lysis of additions, repair of hernia and JP drain placement.   -Also found to have peritonitis.  Completed antibiotics course.    General surgery following.  -Patient on soft diet   AKI on CKD stage 3A -Likely from ATN from septic shock secondary to peritonitis.   -Initiated hemodialysis on 7/10, nephrology following.  IR exchanged temporary dialysis catheter on the right IJ.   Nephrology planning to change it to tunneled catheter per IR   Normocytic anemia:  -Likely associated with critical illness, chronic kidney disease.  Low iron as per labs,given iv iron.     Intermittent sinus tachycardia - Unclear etiology.  Echo showed EF of more than 75%, no regional wall motion abnormality, grade 1 diastolic dysfunction, no valvular abnormality.   -Continue  metoprolol at 25 mg bid.   -TSH  5.359   History of hypertension -Currently  normotensive.   -On amlodipine at home, currently on hold   Acute hypoxic respiratory failure - Had to be put on ventilator after surgery, currently extubated and on room air   Obesity - BMI of 37.1    Debility/deconditioning - Patient seen by PT and recommended skilled nursing facility on discharge.TOC following     Medications     alteplase       alteplase       Chlorhexidine Gluconate Cloth  6 each Topical Daily   Chlorhexidine Gluconate Cloth  6 each Topical Q0600   [START ON 08/05/2021] darbepoetin (ARANESP) injection - DIALYSIS  100 mcg Intravenous Q Fri-HD   feeding supplement (NEPRO CARB STEADY)  237 mL Oral BID BM   heparin injection (subcutaneous)  5,000 Units Subcutaneous Q8H   metoprolol tartrate  25 mg Oral BID   multivitamin  1 tablet Oral QHS   neomycin-bacitracin-polymyxin   Topical TID   pantoprazole  40 mg Oral Daily   polycarbophil  625 mg Oral BID   sevelamer carbonate  800 mg Oral TID WC   sodium chloride flush  10-40 mL Intracatheter Q12H   sodium chloride flush  3 mL Intravenous Q12H     Data Reviewed:   CBG:  Recent Labs  Lab 08/02/21 2108 08/03/21 0003 08/03/21 0453 08/03/21 1156 08/03/21 1711  GLUCAP 163* 82 83 82 76    SpO2: 100 % O2 Flow Rate (L/min): 2 L/min FiO2 (%): 40 %    Vitals:   08/03/21 1053 08/03/21 1056 08/03/21 1202 08/03/21 1720  BP: (!) 108/57 (!) 108/57 99/61 114/68  Pulse: (!) 101 (!) 101 99 88  Resp: 19 20 16 14   Temp:   97.8 F (36.6 C) (!) 97.5 F (36.4 C)  TempSrc:   Oral Oral  SpO2: 100% 100% 97% 100%  Weight:      Height:          Data Reviewed:  Basic Metabolic Panel: Recent Labs  Lab 07/29/21 1107 07/30/21 0741 07/31/21 0229 08/01/21 0440 08/02/21 0742 08/03/21 0458  NA 140 137 140 137 139 141  K 4.2 4.3 4.4 4.2 4.2 4.0  CL 98 98 96* 101 98 104  CO2 20* 19* 19* 24 23 22   GLUCOSE 102* 88 82 98 108* 102*  BUN 77* 48* 55* 62* 68* 73*  CREATININE 8.24* 5.75* 6.57* 7.27* 8.21* 9.46*   CALCIUM 8.5* 8.4* 8.5* 8.4* 8.2* 8.0*  PHOS 8.6* 6.7* 7.8* 8.0* 7.5*  --     CBC: Recent Labs  Lab 07/29/21 0650 07/30/21 0741 07/31/21 0229 08/02/21 0742 08/03/21 0458  WBC 11.7* 14.7* 12.5* 10.8* 11.3*  NEUTROABS 9.0*  --   --   --   --   HGB 9.2* 10.8* 10.5* 7.8* 7.8*  HCT 27.7* 30.7* 30.7* 22.8* 22.8*  MCV 93.0 90.0 91.6 93.4 93.4  PLT 273 364 391 332 321    LFT Recent Labs  Lab 07/29/21 1107 07/30/21 0741 07/31/21 0229 08/01/21 0440 08/02/21 0742  ALBUMIN 1.9* 2.1* 2.0* 1.8* 1.7*     Antibiotics: Anti-infectives (From admission, onward)    Start     Dose/Rate Route Frequency Ordered Stop   07/23/21 1730  piperacillin-tazobactam (ZOSYN) IVPB 2.25 g        2.25 g 100 mL/hr over 30 Minutes Intravenous Every 8 hours 07/23/21 1207 07/28/21 1019   07/22/21 0900  piperacillin-tazobactam (ZOSYN) IVPB 2.25 g  Status:  Discontinued        2.25 g 100 mL/hr over 30 Minutes Intravenous Every 8 hours 07/22/21 0833 07/23/21 1207   07/22/21 0830  piperacillin-tazobactam (ZOSYN) IVPB 3.375 g  Status:  Discontinued        3.375 g 100 mL/hr over 30 Minutes Intravenous Every 8 hours 07/22/21 0827 07/22/21 0831   07/22/21 0000  piperacillin-tazobactam (ZOSYN) IVPB 3.375 g        3.375 g 100 mL/hr over 30 Minutes Intravenous  Once 07/21/21 2352 07/22/21 0142        DVT prophylaxis: Heparin  Code Status: Full code  Family Communication: No family at bedside   CONSULTS nephrology   Objective    Physical Examination:   General-appears in no acute distress Heart-S1-S2, regular, no murmur auscultated Lungs-clear to auscultation bilaterally, no wheezing or crackles auscultated Abdomen-soft, nontender, no organomegaly Extremities-no edema in the lower extremities Neuro-alert, oriented x3, no focal deficit noted  Status is: Inpatient:          Oswald Hillock   [facility] If 7PM-7AM, please contact night-coverage at [email or web address], Office   [phone]   08/03/2021, 7:46 PM  LOS: 12 days

## 2021-08-04 ENCOUNTER — Inpatient Hospital Stay (HOSPITAL_COMMUNITY): Payer: BC Managed Care – PPO

## 2021-08-04 ENCOUNTER — Encounter (HOSPITAL_COMMUNITY): Payer: Self-pay | Admitting: Surgery

## 2021-08-04 DIAGNOSIS — Z8719 Personal history of other diseases of the digestive system: Secondary | ICD-10-CM | POA: Diagnosis not present

## 2021-08-04 DIAGNOSIS — E860 Dehydration: Secondary | ICD-10-CM | POA: Diagnosis not present

## 2021-08-04 DIAGNOSIS — K436 Other and unspecified ventral hernia with obstruction, without gangrene: Secondary | ICD-10-CM | POA: Diagnosis not present

## 2021-08-04 DIAGNOSIS — N179 Acute kidney failure, unspecified: Secondary | ICD-10-CM | POA: Diagnosis not present

## 2021-08-04 HISTORY — PX: IR FLUORO GUIDE CV LINE RIGHT: IMG2283

## 2021-08-04 LAB — GLUCOSE, CAPILLARY
Glucose-Capillary: 102 mg/dL — ABNORMAL HIGH (ref 70–99)
Glucose-Capillary: 57 mg/dL — ABNORMAL LOW (ref 70–99)
Glucose-Capillary: 90 mg/dL (ref 70–99)
Glucose-Capillary: 92 mg/dL (ref 70–99)
Glucose-Capillary: 96 mg/dL (ref 70–99)

## 2021-08-04 LAB — CBC
HCT: 21.6 % — ABNORMAL LOW (ref 36.0–46.0)
Hemoglobin: 7 g/dL — ABNORMAL LOW (ref 12.0–15.0)
MCH: 31.1 pg (ref 26.0–34.0)
MCHC: 32.4 g/dL (ref 30.0–36.0)
MCV: 96 fL (ref 80.0–100.0)
Platelets: 272 10*3/uL (ref 150–400)
RBC: 2.25 MIL/uL — ABNORMAL LOW (ref 3.87–5.11)
RDW: 19 % — ABNORMAL HIGH (ref 11.5–15.5)
WBC: 8.5 10*3/uL (ref 4.0–10.5)
nRBC: 0 % (ref 0.0–0.2)

## 2021-08-04 LAB — COMPREHENSIVE METABOLIC PANEL
ALT: 16 U/L (ref 0–44)
AST: 17 U/L (ref 15–41)
Albumin: 1.6 g/dL — ABNORMAL LOW (ref 3.5–5.0)
Alkaline Phosphatase: 42 U/L (ref 38–126)
Anion gap: 9 (ref 5–15)
BUN: 69 mg/dL — ABNORMAL HIGH (ref 8–23)
CO2: 23 mmol/L (ref 22–32)
Calcium: 8 mg/dL — ABNORMAL LOW (ref 8.9–10.3)
Chloride: 108 mmol/L (ref 98–111)
Creatinine, Ser: 8.76 mg/dL — ABNORMAL HIGH (ref 0.44–1.00)
GFR, Estimated: 5 mL/min — ABNORMAL LOW (ref >60)
Glucose, Bld: 104 mg/dL — ABNORMAL HIGH (ref 70–99)
Potassium: 4 mmol/L (ref 3.5–5.1)
Sodium: 140 mmol/L (ref 135–145)
Total Bilirubin: 0.8 mg/dL (ref 0.3–1.2)
Total Protein: 5.2 g/dL — ABNORMAL LOW (ref 6.5–8.1)

## 2021-08-04 LAB — RENAL FUNCTION PANEL
Albumin: 2 g/dL — ABNORMAL LOW (ref 3.5–5.0)
Anion gap: 12 (ref 5–15)
BUN: 10 mg/dL (ref 8–23)
CO2: 29 mmol/L (ref 22–32)
Calcium: 8.2 mg/dL — ABNORMAL LOW (ref 8.9–10.3)
Chloride: 97 mmol/L — ABNORMAL LOW (ref 98–111)
Creatinine, Ser: 1.51 mg/dL — ABNORMAL HIGH (ref 0.44–1.00)
GFR, Estimated: 38 mL/min — ABNORMAL LOW (ref >60)
Glucose, Bld: 95 mg/dL (ref 70–99)
Phosphorus: 1.3 mg/dL — ABNORMAL LOW (ref 2.5–4.6)
Potassium: 3 mmol/L — ABNORMAL LOW (ref 3.5–5.1)
Sodium: 138 mmol/L (ref 135–145)

## 2021-08-04 MED ORDER — DARBEPOETIN ALFA 100 MCG/0.5ML IJ SOSY
100.0000 ug | PREFILLED_SYRINGE | INTRAMUSCULAR | Status: DC
  Administered 2021-08-04: 100 ug via INTRAVENOUS
  Filled 2021-08-04 (×3): qty 0.5

## 2021-08-04 MED ORDER — CEFAZOLIN SODIUM-DEXTROSE 2-4 GM/100ML-% IV SOLN
INTRAVENOUS | Status: AC | PRN
  Administered 2021-08-04: 2 g via INTRAVENOUS

## 2021-08-04 MED ORDER — FENTANYL CITRATE (PF) 100 MCG/2ML IJ SOLN
INTRAMUSCULAR | Status: AC | PRN
  Administered 2021-08-04: 25 ug via INTRAVENOUS

## 2021-08-04 MED ORDER — MIDAZOLAM HCL 2 MG/2ML IJ SOLN
INTRAMUSCULAR | Status: AC
  Filled 2021-08-04: qty 2

## 2021-08-04 MED ORDER — HEPARIN SODIUM (PORCINE) 1000 UNIT/ML IJ SOLN
INTRAMUSCULAR | Status: AC
  Administered 2021-08-04: 3200 [IU]
  Filled 2021-08-04: qty 10

## 2021-08-04 MED ORDER — HEPARIN SODIUM (PORCINE) 5000 UNIT/ML IJ SOLN
5000.0000 [IU] | Freq: Three times a day (TID) | INTRAMUSCULAR | Status: DC
Start: 2021-08-04 — End: 2021-08-16
  Administered 2021-08-04 – 2021-08-16 (×31): 5000 [IU] via SUBCUTANEOUS
  Filled 2021-08-04 (×28): qty 1

## 2021-08-04 MED ORDER — MIDAZOLAM HCL 2 MG/2ML IJ SOLN
INTRAMUSCULAR | Status: AC | PRN
  Administered 2021-08-04: .5 mg via INTRAVENOUS

## 2021-08-04 MED ORDER — CEFAZOLIN SODIUM-DEXTROSE 2-4 GM/100ML-% IV SOLN
INTRAVENOUS | Status: AC
  Filled 2021-08-04: qty 100

## 2021-08-04 MED ORDER — FENTANYL CITRATE (PF) 100 MCG/2ML IJ SOLN
INTRAMUSCULAR | Status: AC
  Filled 2021-08-04: qty 2

## 2021-08-04 MED ORDER — LIDOCAINE-EPINEPHRINE 1 %-1:100000 IJ SOLN
INTRAMUSCULAR | Status: AC
  Administered 2021-08-04: 15 mL
  Filled 2021-08-04: qty 1

## 2021-08-04 NOTE — Progress Notes (Signed)
[facility]  PROGRESS NOTE  [name] QMG:[id number] DOB: 05/19/1955 DOA: 07/21/2021 PCP: Mancel Bale, PA-C   Brief HPI:   66 year old female with history of hypertension, CKD stage III a,hyperlipidemia, chronically incarcerated large abdominal wall hernia which was repaired multiple times, recent SBO resection with mesh placement who presented to the ER with severe abdominal pain, vomiting.  L CT abdomen/pelvis showed large SBO, large lower abdominal ventral hernia containing much of the small bowel.  General surgery consulted and she was taken to the OR for emergent exploratory laparotomy and hernia repair,she was transferred to ICU.  Patient was hemodynamically stable and patient transferred to [facility] service on 7/13.  On dialysis for persistent AKI, nephrology following.  General surgery were following.  PT recommending skilled nursing facility on discharge   Subjective   Patient seen and examined, denies any complaints.   Assessment/Plan:    Incarcerated/strangulated ventral hernia:  -Status post small bowel resection, lysis of additions, repair of hernia and JP drain placement.   -Also found to have peritonitis.  Completed antibiotics course.    General surgery following.  -Patient on soft diet -Patient has poor p.o. intake, will consult dietitian for nutritional assessment   AKI on CKD stage 3A -Likely from ATN from septic shock secondary to peritonitis.   -Initiated hemodialysis on 7/10, nephrology following.  IR exchanged temporary dialysis catheter on the right IJ.   -IR was consulted to change to tunneled HD catheter -Patient will likely need hemodialysis as outpatient   Normocytic anemia:  -Likely associated with critical illness, chronic kidney disease.   Low iron as per labs,given iv iron.   -Hemoglobin is down to 7.0 -Nephrology to start ESA from today   Intermittent sinus tachycardia - Unclear etiology.  Echo showed EF of more than 75%, no regional  wall motion abnormality, grade 1 diastolic dysfunction, no valvular abnormality.   -Continue  metoprolol at 25 mg bid.   -TSH  5.359   History of hypertension -Currently normotensive.   -On amlodipine at home, currently on hold -Continue metoprolol as above   Acute hypoxic respiratory failure - Had to be put on ventilator after surgery, currently extubated and on room air   Obesity - BMI of 37.1    Debility/deconditioning - Patient seen by PT and recommended skilled nursing facility on discharge.TOC following     Medications     Chlorhexidine Gluconate Cloth  6 each Topical Daily   Chlorhexidine Gluconate Cloth  6 each Topical Q0600   darbepoetin (ARANESP) injection - DIALYSIS  100 mcg Intravenous Q Thu-HD   feeding supplement (NEPRO CARB STEADY)  237 mL Oral BID BM   heparin injection (subcutaneous)  5,000 Units Subcutaneous Q8H   metoprolol tartrate  25 mg Oral BID   multivitamin  1 tablet Oral QHS   neomycin-bacitracin-polymyxin   Topical TID   pantoprazole  40 mg Oral Daily   polycarbophil  625 mg Oral BID   sevelamer carbonate  800 mg Oral TID WC   sodium chloride flush  10-40 mL Intracatheter Q12H   sodium chloride flush  3 mL Intravenous Q12H     Data Reviewed:   CBG:  Recent Labs  Lab 08/03/21 2008 08/03/21 2111 08/04/21 0003 08/04/21 0422 08/04/21 1108  GLUCAP 67* 93 102* 90 92    SpO2: 100 % O2 Flow Rate (L/min): 2 L/min FiO2 (%): 40 %    Vitals:   08/04/21 0910 08/04/21 0929 08/04/21 1113 08/04/21 1226  BP: (!) 100/58 107/69  103/74   Pulse: 86 97 (!) 102 85  Resp: 15 16 16    Temp:  (!) 97.3 F (36.3 C) (!) 97.4 F (36.3 C)   TempSrc:  Oral Oral   SpO2: 98% 99% 100%   Weight:      Height:          Data Reviewed:  Basic Metabolic Panel: Recent Labs  Lab 07/29/21 1107 07/30/21 0741 07/31/21 0229 08/01/21 0440 08/02/21 0742 08/03/21 0458 08/04/21 0450  NA 140 137 140 137 139 141 140  K 4.2 4.3 4.4 4.2 4.2 4.0 4.0  CL 98 98  96* 101 98 104 108  CO2 20* 19* 19* 24 23 22 23   GLUCOSE 102* 88 82 98 108* 102* 104*  BUN 77* 48* 55* 62* 68* 73* 69*  CREATININE 8.24* 5.75* 6.57* 7.27* 8.21* 9.46* 8.76*  CALCIUM 8.5* 8.4* 8.5* 8.4* 8.2* 8.0* 8.0*  PHOS 8.6* 6.7* 7.8* 8.0* 7.5*  --   --     CBC: Recent Labs  Lab 07/29/21 0650 07/30/21 0741 07/31/21 0229 08/02/21 0742 08/03/21 0458 08/04/21 0450  WBC 11.7* 14.7* 12.5* 10.8* 11.3* 8.5  NEUTROABS 9.0*  --   --   --   --   --   HGB 9.2* 10.8* 10.5* 7.8* 7.8* 7.0*  HCT 27.7* 30.7* 30.7* 22.8* 22.8* 21.6*  MCV 93.0 90.0 91.6 93.4 93.4 96.0  PLT 273 364 391 332 321 272    LFT Recent Labs  Lab 07/30/21 0741 07/31/21 0229 08/01/21 0440 08/02/21 0742 08/04/21 0450  AST  --   --   --   --  17  ALT  --   --   --   --  16  ALKPHOS  --   --   --   --  42  BILITOT  --   --   --   --  0.8  PROT  --   --   --   --  5.2*  ALBUMIN 2.1* 2.0* 1.8* 1.7* 1.6*     Antibiotics: Anti-infectives (From admission, onward)    Start     Dose/Rate Route Frequency Ordered Stop   08/04/21 0845  ceFAZolin (ANCEF) IVPB 2g/100 mL premix        over 30 Minutes Intravenous Continuous PRN 08/04/21 0850 08/04/21 0845   08/04/21 0841  ceFAZolin (ANCEF) 2-4 GM/100ML-% IVPB       Note to Pharmacy: Peggyann Shoals: cabinet override      08/04/21 0841 08/04/21 0850   07/23/21 1730  piperacillin-tazobactam (ZOSYN) IVPB 2.25 g        2.25 g 100 mL/hr over 30 Minutes Intravenous Every 8 hours 07/23/21 1207 07/28/21 1019   07/22/21 0900  piperacillin-tazobactam (ZOSYN) IVPB 2.25 g  Status:  Discontinued        2.25 g 100 mL/hr over 30 Minutes Intravenous Every 8 hours 07/22/21 0833 07/23/21 1207   07/22/21 0830  piperacillin-tazobactam (ZOSYN) IVPB 3.375 g  Status:  Discontinued        3.375 g 100 mL/hr over 30 Minutes Intravenous Every 8 hours 07/22/21 0827 07/22/21 0831   07/22/21 0000  piperacillin-tazobactam (ZOSYN) IVPB 3.375 g        3.375 g 100 mL/hr over 30 Minutes  Intravenous  Once 07/21/21 2352 07/22/21 0142        DVT prophylaxis: Heparin  Code Status: Full code  Family Communication: No family at bedside   CONSULTS nephrology   Objective    Physical Examination:  General-appears in no acute distress Heart-S1-S2, regular, no murmur auscultated Lungs-clear to auscultation bilaterally, no wheezing or crackles auscultated Abdomen-soft, nontender, no organomegaly Extremities-no edema in the lower extremities Neuro-alert, oriented x3, no focal deficit noted  Status is: Inpatient:          Oswald Hillock   [facility] If 7PM-7AM, please contact night-coverage at [email or web address], Office  [phone]   08/04/2021, 4:07 PM  LOS: 13 days

## 2021-08-04 NOTE — Progress Notes (Signed)
New Dialysis Start   Patient identified as new dialysis start. Kidney Education packet assembled and given. Discussed the following items with patient:    Current medications and possible changes once started:  Discussed that patient's medications may change over time.  Ex; hypertension medications and diabetes medication.  Nephrologists will adjust as needed.  Fluid restrictions reviewed:  32 oz daily goal:  All liquids count; soups, ice, jello   Phosphorus and potassium: Handout given showing high potassium and phosphorus foods.  Alternative food and drink options given.  Family support:  no family at bedside  Outpatient Clinic Resources:  Discussed roles of Outpatient clinic  staff and advised to make a list of needs, if any, to talk with outpatient staff if needed  Care plan schedule: Informed patient and family member of Care Plans in outpatient setting and to participate in the care plan.  An invitation would be given from outpatient clinic.   Dialysis Access Options:  Reviewed access options with patients. Discussed in detail about care at home with new AVG & AVF. Reviewed checking bruit and thrill. If dialysis catheter present, educated that patient could not take showers.  Catheter dressing changes were to be done by outpatient clinic staff only  Home therapy options:  Educated patient about home therapy options:  PD vs home hemo.    Patient verbalized understanding. Will continue to round on patient during admission.    Lissandro Dilorenzo W Amaziah Raisanen, RN   

## 2021-08-04 NOTE — Progress Notes (Signed)
Occupational Therapy Treatment Patient Details Name: [name] MRN: [id number] DOB: [birth year] Today's Date: 08/04/2021   History of present illness Pt is a 66 y.o. female admitted 07/21/21 with worsening abdominal pain. S/p emergent ex lap, small bowel resection, adhesiolysis x90 min, incarcerated ventral hernia repair with JP drain placement on 7/7. ETT 7/7-7/11. Course complicated by AKI on CKD 3; HD initiated 7/10. NGT removed 7/13. Temp catheter not working in HD 7/14; to IR for exchange. PMH includes recurrent ventral hernia s/p hernia and small bowel repairs, HTN.   OT comments  Patient just returned from IR Surgery, non-tunneled cath converted to tunneled in right arm.  Patient agreeable to out of bed for breakfast.  Side lying to sit with Min A and increased time.  Sit to stand Min A and step pivot with Mod A.  Patient able to complete bilateral UB HEP: one set and 10 reps of seated chest press and lateral raise.  Patient's primary complaint is generalized weakness.  OT will continue efforts in the acute setting with SNF recommended for post acute rehab to maximize functional status prior to returning home.     Recommendations for follow up therapy are one component of a multi-disciplinary discharge planning process, led by the attending physician.  Recommendations may be updated based on patient status, additional functional criteria and insurance authorization.    Follow Up Recommendations  Skilled nursing-short term rehab (<3 hours/day)    Assistance Recommended at Discharge Frequent or constant Supervision/Assistance  Patient can return home with the following  Help with stairs or ramp for entrance;Assist for transportation;Assistance with cooking/housework;Direct supervision/assist for medications management;A lot of help with bathing/dressing/bathroom;A lot of help with walking and/or transfers   Equipment Recommendations  BSC/3in1;Tub/shower seat;Wheelchair (measurements  OT);Wheelchair cushion (measurements OT)    Recommendations for Other Services      Precautions / Restrictions Precautions Precautions: Fall Precaution Comments: watch HR Restrictions Weight Bearing Restrictions: No       Mobility Bed Mobility Overal bed mobility: Needs Assistance Bed Mobility: Sidelying to Sit   Sidelying to sit: Min assist            Transfers Overall transfer level: Needs assistance Equipment used: Rolling walker (2 wheels) Transfers: Sit to/from Stand, Bed to chair/wheelchair/BSC Sit to Stand: Min assist     Step pivot transfers: Mod assist           Balance Overall balance assessment: Needs assistance Sitting-balance support: Feet supported, Single extremity supported Sitting balance-Leahy Scale: Fair     Standing balance support: Reliant on assistive device for balance Standing balance-Leahy Scale: Poor                             ADL either performed or assessed with clinical judgement   ADL   Eating/Feeding: Independent;Sitting   Grooming: Wash/dry hands;Wash/dry face;Set up;Oral care;Sitting           Upper Body Dressing : Minimal assistance;Sitting   Lower Body Dressing: Maximal assistance;Sit to/from stand                      Extremity/Trunk Assessment Upper Extremity Assessment Upper Extremity Assessment: Generalized weakness   Lower Extremity Assessment Lower Extremity Assessment: Defer to PT evaluation   Cervical / Trunk Assessment Cervical / Trunk Assessment: Kyphotic                      Cognition Arousal/Alertness: Awake/alert Behavior  During Therapy: Flat affect Overall Cognitive Status: Within Functional Limits for tasks assessed                                                       General Comments  HR sustained in the 90's    Pertinent Vitals/ Pain       Pain Assessment Pain Assessment: No/denies pain Pain Intervention(s): Monitored during  session                                                          Frequency  Min 2X/week        Progress Toward Goals  OT Goals(current goals can now be found in the care plan section)  Progress towards OT goals: Progressing toward goals  Acute Rehab OT Goals OT Goal Formulation: With patient Time For Goal Achievement: 08/12/21 Potential to Achieve Goals: Independence Discharge plan remains appropriate    Co-evaluation                 AM-PAC OT "6 Clicks" Daily Activity     Outcome Measure   Help from another person eating meals?: None Help from another person taking care of personal grooming?: None Help from another person toileting, which includes using toliet, bedpan, or urinal?: A Lot Help from another person bathing (including washing, rinsing, drying)?: A Lot Help from another person to put on and taking off regular upper body clothing?: A Little Help from another person to put on and taking off regular lower body clothing?: A Lot 6 Click Score: 17    End of Session Equipment Utilized During Treatment: Rolling walker (2 wheels)  OT Visit Diagnosis: Unsteadiness on feet (R26.81);Muscle weakness (generalized) (M62.81)   Activity Tolerance Patient tolerated treatment well   Patient Left in chair;with call bell/phone within reach;with family/visitor present   Nurse Communication Mobility status        Time: 5102-5852 OT Time Calculation (min): 21 min  Charges: OT General Charges $OT Visit: 1 Visit OT Treatments $Self Care/Home Management : 8-22 mins  08/04/2021  RP, OTR/L  Acute Rehabilitation Services  Office:  [phone]   Metta Clines 08/04/2021, 10:18 AM

## 2021-08-04 NOTE — Procedures (Signed)
Pre-procedure Diagnosis: ESRD Post-procedure Diagnosis: Same  Successful conversion of existing non-tunneled to tunneled HD catheter with tips terminating within the superior aspect of the right atrium.    Complications: None Immediate EBL: None  The catheter is ready for immediate use.   Ronny Bacon, MD Pager #: [phone]

## 2021-08-04 NOTE — Consult Note (Signed)
Chief Complaint: Patient was seen in consultation today for conversion of a temporary catheter to tunneled dialysis catheter  Chief Complaint  Patient presents with   Abdominal Pain   at the request of Singh,Vikas  Referring Physician(s): Singh,Vikas  Supervising Physician: Aletta Edouard  Patient Status: [facility] - In-pt  History of Present Illness: [name] is a 66 y.o. 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   Patient is known to IR service for right IJ temporary dialysis catheter placement on 7/14, reversion on 7/17 due to malfunctioning.  Patient presents to IR today for conversion of the temp cath to tunneled catheter.   Patient laying in bed, not in acute distress.  Denise headache, fever, chills, shortness of breath, cough, chest pain, abdominal pain, nausea ,vomiting, and bleeding.  Past Medical History:  Diagnosis Date   Abdominal distention    Abdominal pain    Arthritis    Blood transfusion without reported diagnosis    Hernia    Hyperlipidemia    Hypertension    Leg swelling    Strangulated recurrent ventral incisional hernia, s/p small intestine resection 09/22/2010    Past Surgical History:  Procedure Laterality Date   ABDOMINAL HYSTERECTOMY     bowel obstruction  09/12/2010   HERNIA REPAIR  2007   incarcerated, open with biologic mesh   HERNIA REPAIR  Aug2012   strangulated, redo open with biologic mesh   INGUINAL HERNIA REPAIR N/A 07/22/2021   Procedure: HERNIA REPAIR VENTRAL INCARCERATED;  Surgeon: [name], MD;  Location: [facility];  Service: General;  Laterality: N/A;   IR FLUORO GUIDE CV LINE RIGHT  07/29/2021   IR FLUORO GUIDE CV LINE RIGHT  08/01/2021   IR US GUIDE VASC ACCESS RIGHT  07/29/2021   LAPAROTOMY N/A 07/22/2021    Procedure: EXPLORATORY LAPAROTOMY;  Surgeon: [name], MD;  Location: [facility];  Service: General;  Laterality: N/A;   SMALL INTESTINE SURGERY  09/12/10   SBR in incarcerated [place]    Allergies: Lisinopril  Medications: Prior to Admission medications   Medication Sig Start Date End Date Taking? Authorizing Provider  amLODipine (NORVASC) 10 MG tablet TAKE 1 TABLET(10 MG) BY MOUTH DAILY 07/11/17  Yes Weber, Damaris Hippo, PA-C     Family History  Problem Relation Age of Onset   Heart disease Mother        heart attack    Heart disease Father        heart attack    Hypertension Sister    Breast cancer Sister     Social History   Socioeconomic History   Marital status: Widowed    Spouse name: Not on file   Number of children: Not on file   Years of education: Not on file   Highest education level: Not on file  Occupational History   Not on file  Tobacco Use   Smoking status: Never   Smokeless tobacco: Never  Vaping Use   Vaping Use: Never used  Substance and Sexual Activity   Alcohol use: No   Drug use: No   Sexual activity: Never    Birth control/protection: Abstinence  Other Topics Concern   Not on file  Social History Narrative   Not on file   Social Determinants of Health  Financial Resource Strain: Not on file  Food Insecurity: Not on file  Transportation Needs: Not on file  Physical Activity: Not on file  Stress: Not on file  Social Connections: Not on file     Review of Systems: A 12 point ROS discussed and pertinent positives are indicated in the HPI above.  All other systems are negative.  Vital Signs: BP 97/60 (BP Location: Left Arm)   Pulse 86   Temp (!) 97.4 F (36.3 C) (Oral)   Resp 14   Ht _0  (1.549 m)   Wt 180 lb 8.9 oz (81.9 kg)   SpO2 98%   BMI 34.12 kg/m    Physical Exam Vitals reviewed.  Constitutional:      General: She is not in acute distress.    Appearance: She is well-developed. She is not ill-appearing.  HENT:      Head: Normocephalic.  Cardiovascular:     Rate and Rhythm: Normal rate and regular rhythm.  Pulmonary:     Effort: Pulmonary effort is normal.     Breath sounds: Normal breath sounds.  Abdominal:     General: Abdomen is flat.     Palpations: Abdomen is soft.  Skin:    General: Skin is warm and dry.     Coloration: Skin is not cyanotic or jaundiced.  Neurological:     Mental Status: She is alert and oriented to person, place, and time.  Psychiatric:        Mood and Affect: Mood normal.        Behavior: Behavior normal.     MD Evaluation Airway: WNL Heart: WNL Abdomen: WNL Chest/ Lungs: WNL ASA  Classification: 3 Mallampati/Airway Score: Two  Imaging: IR Fluoro Guide CV Line Right  Result Date: 08/01/2021 INDICATION: 66 year old woman status post temporary hemodialysis catheter placement via right internal jugular venous access on 07/29/2021 returns to IR given poorly functioning dialysis catheter. EXAM: Fluoroscopy guided exchange of right IJ temporary hemodialysis catheter MEDICATIONS: None ANESTHESIA/SEDATION: None FLUOROSCOPY: Radiation Exposure Index (as provided by the fluoroscopic device): 5 mGy Kerma COMPLICATIONS: None immediate. PROCEDURE: Informed written consent was obtained from the patient after a thorough discussion of the procedural risks, benefits and alternatives. All questions were addressed. Maximal Sterile Barrier Technique was utilized including caps, mask, sterile gowns, sterile gloves, sterile drape, hand hygiene and skin antiseptic. A timeout was performed prior to the initiation of the procedure. Patient positioned supine on the procedure table. The external segment of the existing 16 cm non tunneled hemodialysis catheter it is on the skin prepped and draped usual fashion. Following local administration, the existing catheter was removed over 0.035 inch guidewire. New 20 cm catheter was inserted over the guidewire with tip positioned in the right atrium. All  lumens aspirated and flushed well. The dialysis lumens were locked with heparin. Catheter secured to skin with suture Insertion site was covered with sterile dressing. IMPRESSION: Successful exchange of 16 cm temporary right IJ hemodialysis catheter for 20 cm temporary hemodialysis catheter. Tip located in the right atrium. Catheter is ready for use. Electronically Signed   By: Miachel Roux M.D.   On: 08/01/2021 16:45   IR Fluoro Guide CV Line Right  Result Date: 07/29/2021 INDICATION: 66 year old female referred for temporary non tunneled hemodialysis catheter EXAM: IMAGE GUIDED PLACEMENT OF NON TUNNELED HEMODIALYSIS CATHETER MEDICATIONS: None ANESTHESIA/SEDATION: None FLUOROSCOPY: Radiation Exposure Index (as provided by the fluoroscopic device): 12 seconds, 5 mGy Kerma COMPLICATIONS: None PROCEDURE: Informed written consent was obtained from the  patient after a discussion of the risks, benefits, and alternatives to treatment. Questions regarding the procedure were encouraged and answered. The right neck was prepped with chlorhexidine in a sterile fashion, and a sterile drape was applied covering the operative field. Maximum barrier sterile technique with sterile gowns and gloves were used for the procedure. A timeout was performed prior to the initiation of the procedure. A micropuncture kit was utilized to access the right internal jugular vein under direct, real-time ultrasound guidance after the overlying soft tissues were anesthetized with 1% lidocaine with epinephrine. Ultrasound image documentation was performed. The microwire was kinked to measure appropriate catheter length. A stiff wire was advanced to the level of the IVC. A 16 cm hemodialysis catheter was then placed over the wire. Final catheter positioning was confirmed and documented with a spot radiographic image. The catheter aspirates and flushes normally. The catheter was flushed with appropriate volume heparin dwells. Dressings were  applied. The patient tolerated the procedure well without immediate post procedural complication. IMPRESSION: Status post right IJ non tunneled triple-lumen hemodialysis catheter. Signed, Dulcy Fanny. Nadene Rubins, RPVI [facility] Radiology Electronically Signed   By: Corrie Mckusick D.O.   On: 07/29/2021 17:08   IR US Guide Vasc Access Right  Result Date: 07/29/2021 INDICATION: 66 year old female referred for temporary non tunneled hemodialysis catheter EXAM: IMAGE GUIDED PLACEMENT OF NON TUNNELED HEMODIALYSIS CATHETER MEDICATIONS: None ANESTHESIA/SEDATION: None FLUOROSCOPY: Radiation Exposure Index (as provided by the fluoroscopic device): 12 seconds, 5 mGy Kerma COMPLICATIONS: None PROCEDURE: Informed written consent was obtained from the patient after a discussion of the risks, benefits, and alternatives to treatment. Questions regarding the procedure were encouraged and answered. The right neck was prepped with chlorhexidine in a sterile fashion, and a sterile drape was applied covering the operative field. Maximum barrier sterile technique with sterile gowns and gloves were used for the procedure. A timeout was performed prior to the initiation of the procedure. A micropuncture kit was utilized to access the right internal jugular vein under direct, real-time ultrasound guidance after the overlying soft tissues were anesthetized with 1% lidocaine with epinephrine. Ultrasound image documentation was performed. The microwire was kinked to measure appropriate catheter length. A stiff wire was advanced to the level of the IVC. A 16 cm hemodialysis catheter was then placed over the wire. Final catheter positioning was confirmed and documented with a spot radiographic image. The catheter aspirates and flushes normally. The catheter was flushed with appropriate volume heparin dwells. Dressings were applied. The patient tolerated the procedure well without immediate  post procedural complication. IMPRESSION: Status post right IJ non tunneled triple-lumen hemodialysis catheter. Signed, Dulcy Fanny. Nadene Rubins, RPVI [facility] Radiology Electronically Signed   By: Corrie Mckusick D.O.   On: 07/29/2021 17:08   DG CHEST PORT 1 VIEW  Result Date: 07/25/2021 CLINICAL DATA:  Central line placement. EXAM: PORTABLE CHEST 1 VIEW COMPARISON:  07/24/2021. FINDINGS: Left IJ central line tip is likely up against the lateral wall of the SVC. Right IJ central line tip is in the SVC. Nasogastric tube is followed into the stomach with the tip projecting beyond the inferior margin of the image. Endotracheal to terminates approximately 4.6 cm above the carina. Heart size stable. Bibasilar collapse/consolidation with a left pleural effusion. No pneumothorax. Biapical pleural thickening. IMPRESSION: 1. Left IJ central line placement without complicating feature. 2. Bibasilar collapse/consolidation.  Pneumonia cannot be excluded. 3. Left pleural effusion. Electronically Signed  By: Lorin Picket M.D.   On: 07/25/2021 15:13   DG Chest Port 1 View  Result Date: 07/24/2021 CLINICAL DATA:  Respirator dependent Evaluate atelectasis. pleural effusion. pneumothorax EXAM: PORTABLE CHEST 1 VIEW COMPARISON:  07/23/2021 and older studies. FINDINGS: Mild increase in right lung base opacity. Stable mild medial left lung base opacity. Remainder of the lungs is clear. Possible small right effusion.  No pneumothorax. Endotracheal tube, nasal/orogastric tube and right internal jugular central venous catheter are stable in well positioned. IMPRESSION: 1. Mild increase in right lung base opacity, likely due to atelectasis. Possible small right effusion. 2. No other change. 3. Stable well-positioned support apparatus. Electronically Signed   By: Lajean Manes M.D.   On: 07/24/2021 08:38   DG Abd 1 View  Result Date: 07/23/2021 CLINICAL DATA:  NG tube  placement EXAM: ABDOMEN - 1 VIEW COMPARISON:  07/22/2021 FINDINGS: Esophageal tube tip overlies the proximal stomach, side-port in the region of GE junction. Endotracheal tube tip about 3 cm superior to the carina. Gas-filled slightly distended small bowel in the left upper quadrant. IMPRESSION: Esophageal tube tip overlies the proximal stomach, side-port in the region of GE junction, further advancement could be considered for more optimal positioning. Electronically Signed   By: Donavan Foil M.D.   On: 07/23/2021 20:20   ECHOCARDIOGRAM COMPLETE  Result Date: 07/23/2021    ECHOCARDIOGRAM REPORT   Patient Name:   [name] Date of Exam: 07/23/2021 Medical Rec #:  [id number]       Height:       61.0 in Accession #:    [id number]      Weight:       215.0 lb Date of Birth:  [birth year]       BSA:          1.948 m Patient Age:    3 years        BP:           110/61 mmHg Patient Gender: F               HR:           82 bpm. Exam Location:  Inpatient Procedure: 2D Echo, Cardiac Doppler, Color Doppler and Intracardiac            Opacification Agent Indications:    Abnormal ECG R94.31                 Murmur R01.1  History:        Patient has no prior history of Echocardiogram examinations.                 Risk Factors:Hypertension and Dyslipidemia.  Sonographer:    Darlina Sicilian RDCS Referring Phys: Idamae Schuller IMPRESSIONS  1. Mid-systolic obliteration of the LV cavity is noted. Left ventricular ejection fraction, by estimation, is >75%. The left ventricle has hyperdynamic function. The left ventricle has no regional wall motion abnormalities. There is mild left ventricular hypertrophy. Left ventricular diastolic parameters are consistent with Grade I diastolic dysfunction (impaired relaxation).  2. Right ventricular systolic function is hyperdynamic. The right ventricular size is not well visualized.  3. The mitral valve is grossly normal. No evidence of mitral valve regurgitation.  4. The aortic valve was not well  visualized. Aortic valve regurgitation is mild. Aortic valve sclerosis/calcification is present, without any evidence of aortic stenosis.  5. The inferior vena cava is normal in size with greater than 50% respiratory variability, suggesting right atrial pressure of 3  mmHg. Comparison(s): No prior Echocardiogram. FINDINGS  Left Ventricle: Mid-systolic obliteration of the LV cavity is noted. Left ventricular ejection fraction, by estimation, is >75%. The left ventricle has hyperdynamic function. The left ventricle has no regional wall motion abnormalities. Definity contrast agent was given IV to delineate the left ventricular endocardial borders. The left ventricular internal cavity size was normal in size. There is mild left ventricular hypertrophy. Left ventricular diastolic parameters are consistent with Grade I  diastolic dysfunction (impaired relaxation). Indeterminate filling pressures. Right Ventricle: The right ventricular size is not well visualized. Right vetricular wall thickness was not well visualized. Right ventricular systolic function is hyperdynamic. Left Atrium: Left atrial size was normal in size. Right Atrium: Right atrial size was normal in size. Pericardium: There is no evidence of pericardial effusion. Mitral Valve: The mitral valve is grossly normal. No evidence of mitral valve regurgitation. Tricuspid Valve: The tricuspid valve is not well visualized. Tricuspid valve regurgitation is not demonstrated. Aortic Valve: The aortic valve was not well visualized. Aortic valve regurgitation is mild. Aortic regurgitation PHT measures 538 msec. Aortic valve sclerosis/calcification is present, without any evidence of aortic stenosis. Pulmonic Valve: The pulmonic valve was not well visualized. Pulmonic valve regurgitation is not visualized. Aorta: Aortic root could not be assessed. Venous: The inferior vena cava is normal in size with greater than 50% respiratory variability, suggesting right atrial  pressure of 3 mmHg. IAS/Shunts: The interatrial septum was not well visualized.   Diastology LV e' medial:    5.00 cm/s LV E/e' medial:  12.7 LV e' lateral:   4.79 cm/s LV E/e' lateral: 13.2  RIGHT VENTRICLE RV S prime:     24.40 cm/s TAPSE (M-mode): 1.5 cm LEFT ATRIUM             Index        RIGHT ATRIUM           Index LA Vol (A2C):   39.6 ml 20.33 ml/m  RA Area:     11.50 cm LA Vol (A4C):   47.3 ml 24.28 ml/m  RA Volume:   24.10 ml  12.37 ml/m LA Biplane Vol: 43.8 ml 22.49 ml/m  AORTIC VALVE AI PHT:      538 msec MITRAL VALVE MV Area (PHT): 2.42 cm MV Decel Time: 314 msec MV E velocity: 63.40 cm/s MV A velocity: 94.30 cm/s MV E/A ratio:  0.67 Lyman Bishop MD Electronically signed by Lyman Bishop MD Signature Date/Time: 07/23/2021/11:44:42 AM    Final    DG CHEST PORT 1 VIEW  Result Date: 07/23/2021 CLINICAL DATA:  Intubated EXAM: PORTABLE CHEST 1 VIEW COMPARISON:  Radiograph 07/22/2021 FINDINGS: Endotracheal tube overlies the midthoracic trachea approximately 2.7 cm above the carina. Right neck catheter tip overlies the distal superior vena cava. Orogastric tube tip and side port overlie the stomach. Unchanged cardiomediastinal silhouette. Unchanged low lung volumes and bibasilar opacities favored to be atelectasis. Haziness of the right hemithorax likely due to radiographic technique. No definite new airspace disease. Right basilar subsegmental atelectasis. No large pleural effusion. No pneumothorax. Bones are unchanged. IMPRESSION: Unchanged low lung volumes and bibasilar opacities favored to be atelectasis. Endotracheal tube tip overlies the midthoracic trachea approximately 2.7 cm above the carina. Electronically Signed   By: Maurine Simmering M.D.   On: 07/23/2021 09:10   DG CHEST PORT 1 VIEW  Result Date: 07/22/2021 CLINICAL DATA:  Intubated and on ventilator. EXAM: PORTABLE CHEST 1 VIEW COMPARISON:  Radiograph yesterday. FINDINGS: Endotracheal tube tip at the level of  the clavicular heads. Tip and  side port of the enteric tube below the diaphragm in the stomach. There is a new right internal jugular large-bore catheter with tip overlying the lower SVC. No pneumothorax. Lower lung volumes from prior exam with bibasilar volume loss. Streaky atelectasis in the right greater than left lung base stable heart size and mediastinal contours. IMPRESSION: 1. Endotracheal tube tip at the clavicular heads. Enteric tube tip and side-port below the diaphragm in the stomach. Right internal jugular catheter with tip overlying the lower SVC. No pneumothorax. 2. Lower lung volumes with bibasilar volume loss and streaky atelectasis. Electronically Signed   By: Keith Rake M.D.   On: 07/22/2021 19:10   DG Abdomen 1 View  Result Date: 07/22/2021 CLINICAL DATA:  Check gastric catheter placement EXAM: ABDOMEN - 1 VIEW COMPARISON:  CT from the previous day. FINDINGS: Gastric catheter is been advanced into the stomach. No free air is seen. The known small bowel obstruction is not well appreciated on this exam. IMPRESSION: Gastric catheter within the stomach. Electronically Signed   By: Inez Catalina M.D.   On: 07/22/2021 02:31   CT ABDOMEN PELVIS WO CONTRAST  Result Date: 07/22/2021 CLINICAL DATA:  Abdominal pain, acute.  Nausea and vomiting. EXAM: CT ABDOMEN AND PELVIS WITHOUT CONTRAST TECHNIQUE: Multidetector CT imaging of the abdomen and pelvis was performed following the standard protocol without IV contrast. RADIATION DOSE REDUCTION: This exam was performed according to the departmental dose-optimization program which includes automated exposure control, adjustment of the mA and/or kV according to patient size and/or use of iterative reconstruction technique. COMPARISON:  CT 09/13/2019 FINDINGS: Lower chest: Ill-defined opacities in the right lower and middle lobe favoring atelectasis. No pleural fluid. Hepatobiliary: No focal hepatic abnormality on this unenhanced exam. Layering gallstones and probable sludge. No  pericholecystic inflammation or biliary dilatation. Pancreas: Pancreatic atrophy.  No ductal dilatation or inflammation. Spleen: Punctate granuloma.  Normal in size. Adrenals/Urinary Tract: No adrenal nodule. Mild bilateral renal parenchymal atrophy. No hydronephrosis. Small cyst in the lower left kidney, needing no further follow-up. No renal calculi. Unremarkable urinary bladder. Stomach/Bowel: Large complex lower ventral abdominal wall hernia, with hernia contents deviating to the right. Majority of the small bowel is located within this ventral abdominal wall hernia. Hernia contains multiple loops of small bowel. Small bowel obstruction with dilated fluid-filled small bowel. The stomach is prominently distended with fluid. There is also fluid distending the included distal esophagus. Small bowel transition point right aspect of the ventral hernia, for example series 4, image 44 and series 6, image 48. Multiple enteric sutures are noted within small bowel in the hernia sac. Multifocal colonic diverticulosis without focal diverticulitis. There is mild mesenteric edema in the small bowel without small bowel pneumatosis. Vascular/Lymphatic: Aortic atherosclerosis. No aortic aneurysm. No portal venous or mesenteric gas. No bulky abdominopelvic adenopathy. Reproductive: Electronic records lists history of hysterectomy, however in atrophic uterus is visualized in the pelvis. No adnexal mass. Other: No ascites or free air. Mesenteric edema and stranding involving small bowel both within the ventral abdominal wall hernia at and just proximal to. Musculoskeletal: Scoliosis and degenerative change in the spine. There are no acute or suspicious osseous abnormalities. IMPRESSION: 1. Small bowel obstruction. There is a large lower abdominal ventral abdominal wall hernia containing majority of the small bowel, the transition point is located within this ventral abdominal wall hernia. 2. Colonic diverticulosis without focal  diverticulitis. 3. Cholelithiasis without acute inflammation. Aortic Atherosclerosis (ICD10-I70.0). Electronically Signed   By: [name]  Sanford M.D.   On: 07/22/2021 00:20   DG Chest Portable 1 View  Result Date: 07/21/2021 CLINICAL DATA:  Altered mental status and hypotension EXAM: PORTABLE CHEST 1 VIEW COMPARISON:  01/12/2016 FINDINGS: The heart size and mediastinal contours are within normal limits. Both lungs are clear. The visualized skeletal structures are unremarkable. IMPRESSION: No active disease. Electronically Signed   By: Inez Catalina M.D.   On: 07/21/2021 20:27    Labs:  CBC: Recent Labs    07/31/21 0229 08/02/21 0742 08/03/21 0458 08/04/21 0450  WBC 12.5* 10.8* 11.3* 8.5  HGB 10.5* 7.8* 7.8* 7.0*  HCT 30.7* 22.8* 22.8* 21.6*  PLT 391 332 321 272    COAGS: Recent Labs    07/23/21 0527  INR 1.8*  APTT 37*    BMP: Recent Labs    08/01/21 0440 08/02/21 0742 08/03/21 0458 08/04/21 0450  NA 137 139 141 140  K 4.2 4.2 4.0 4.0  CL 101 98 104 108  CO2 _0 GLUCOSE 98 108* 102* 104*  BUN 62* 68* 73* 69*  CALCIUM 8.4* 8.2* 8.0* 8.0*  CREATININE 7.27* 8.21* 9.46* 8.76*  GFRNONAA 6* 5* 4* 5*    LIVER FUNCTION TESTS: Recent Labs    07/21/21 2056 07/23/21 0319 07/26/21 0556 07/31/21 0229 08/01/21 0440 08/02/21 0742 08/04/21 0450  BILITOT 0.6 1.0  --   --   --   --  0.8  AST 27 36  --   --   --   --  17  ALT 17 34  --   --   --   --  16  ALKPHOS 56 36*  --   --   --   --  42  PROT 6.0* 5.0*  --   --   --   --  5.2*  ALBUMIN 2.7* 2.4*   < > 2.0* 1.8* 1.7* 1.6*   < > = values in this interval not displayed.    TUMOR MARKERS: No results for input(s): "AFPTM", "CEA", "CA199", "CHROMGRNA" in the last 8760 hours.  Assessment and Plan: 66 y.o. 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   NPO since MN VSS WBC normal  Risks and benefits discussed with the patient including, but not limited to bleeding, infection,  vascular injury, pneumothorax which may require chest tube placement, air embolism or even death  All of the patient's questions were answered, patient is agreeable to proceed. Consent signed and in chart.    Thank you for this interesting consult.  I greatly enjoyed meeting [facility] and look forward to participating in their care.  A copy of this report was sent to the requesting provider on this date.  Electronically Signed: Tera Mater, PA-C 08/04/2021, 8:13 AM   I spent a total of 20 Minutes    in face to face in clinical consultation, greater than 50% of which was counseling/coordinating care for tunneled catheter placement.   This chart was dictated using voice recognition software.  Despite best efforts to proofread,  errors can occur which can change the documentation meaning.

## 2021-08-04 NOTE — Sedation Documentation (Signed)
Attempted to call report to 4E. I was told by the secretary that nurses don't take report during progression so no one was available to take report. Let her know the patient needs to come back to unit due to another case coming in and was told that's fine.  Left number for call back.

## 2021-08-04 NOTE — TOC Progression Note (Signed)
Transition of Care [facility]) - Progression Note    Patient Details  Name: [name] MRN: [id number] Date of Birth: [birth year]  Transition of Care [facility]) CM/SW [place],  Phone Number: 08/04/2021, 5:08 PM  Clinical Narrative:     CSW spoke with patient's sister,Mary- family has selected [facility]. [facility] does ot transport to dialysis, CSW will follow up with family regarding transportation  Thurmond Butts, MSW, LCSW Clinical Social Worker    Expected Discharge Plan: [place] Barriers to Discharge: Continued Medical Work up  Expected Discharge Plan and Services Expected Discharge Plan: [place] arrangements for the past 2 months: Single Family Home                                       Social Determinants of Health (SDOH) Interventions    Readmission Risk Interventions     No data to display

## 2021-08-04 NOTE — Progress Notes (Signed)
Physical Therapy Treatment Patient Details Name: [name] MRN: [id number] DOB: [birth year] Today's Date: 08/04/2021   History of Present Illness Pt is a 66 y.o. female admitted 07/21/21 with worsening abdominal pain. S/p emergent ex lap, small bowel resection, adhesiolysis x90 min, incarcerated ventral hernia repair with JP drain placement on 7/7. ETT 7/7-7/11. Course complicated by AKI on CKD 3; HD initiated 7/10. NGT removed 7/13. Temp catheter not working in HD 7/14; to IR for exchange. PMH includes recurrent ventral hernia s/p hernia and small bowel repairs, HTN.    PT Comments    Pt received OOB in recliner requesting to return to bed and agreeable to session. Pt able to come to standing from low recliner with light min assist to power up to RW and step pivot to EOB with mod assist to manage RW and guide hips. Pt with fair tolerance for second standing trial with standing therex and side steps to [facility] with min guard for safety. Further ambulation and exercise deferred secondary to pt stated fatigue. Current plan remains appropriate to address deficits and maximize functional independence and decrease caregiver burden. Pt continues to benefit from skilled PT services to progress toward functional mobility goals.   HR 86bpm at rest HR max up to 132bpm with activity SpO2 98%    Recommendations for follow up therapy are one component of a multi-disciplinary discharge planning process, led by the attending physician.  Recommendations may be updated based on patient status, additional functional criteria and insurance authorization.  Follow Up Recommendations  Skilled nursing-short term rehab (<3 hours/day) (pt declined) Can patient physically be transported by private vehicle:  (TBD)   Assistance Recommended at Discharge Frequent or constant Supervision/Assistance  Patient can return home with the following A lot of help with walking and/or transfers;A lot of help with  bathing/dressing/bathroom;Assistance with cooking/housework;Assist for transportation;Help with stairs or ramp for entrance   Equipment Recommendations  Rolling walker (2 wheels);BSC/3in1;Wheelchair (measurements PT);Wheelchair cushion (measurements PT)    Recommendations for Other Services       Precautions / Restrictions Precautions Precautions: Fall;Other (comment) Precaution Comments: watch HR Restrictions Weight Bearing Restrictions: No     Mobility  Bed Mobility Overal bed mobility: Needs Assistance Bed Mobility: Sit to Supine       Sit to supine: Mod assist   General bed mobility comments: mod a to bring BLE into bed    Transfers Overall transfer level: Needs assistance Equipment used: Rolling walker (2 wheels) Transfers: Sit to/from Stand, Bed to chair/wheelchair/BSC Sit to Stand: Min assist   Step pivot transfers: Mod assist       General transfer comment: min a to power up from recliner to RW    Ambulation/Gait             Pre-gait activities: pt able to march in place with RW with min guard x10 and step towards [facility]     Stairs             Wheelchair Mobility    Modified Rankin (Stroke Patients Only)       Balance Overall balance assessment: Needs assistance Sitting-balance support: No upper extremity supported, Single extremity supported Sitting balance-Leahy Scale: Fair     Standing balance support: Reliant on assistive device for balance Standing balance-Leahy Scale: Poor                              Cognition Arousal/Alertness: Awake/alert Behavior During Therapy: Flat affect Overall  Cognitive Status: Within Functional Limits for tasks assessed                                          Exercises Other Exercises Other Exercises: standing marching x10    General Comments        Pertinent Vitals/Pain Pain Assessment Pain Assessment: No/denies pain Pain Intervention(s): Monitored during  session    Home Living                          Prior Function            PT Goals (current goals can now be found in the care plan section) Acute Rehab PT Goals Patient Stated Goal: return home PT Goal Formulation: With patient Time For Goal Achievement: 08/10/21    Frequency    Min 3X/week      PT Plan Current plan remains appropriate    Co-evaluation              AM-PAC PT "6 Clicks" Mobility   Outcome Measure  Help needed turning from your back to your side while in a flat bed without using bedrails?: A Lot Help needed moving from lying on your back to sitting on the side of a flat bed without using bedrails?: A Lot Help needed moving to and from a bed to a chair (including a wheelchair)?: A Lot Help needed standing up from a chair using your arms (e.g., wheelchair or bedside chair)?: A Lot Help needed to walk in hospital room?: Total Help needed climbing 3-5 steps with a railing? : Total 6 Click Score: 10    End of Session   Activity Tolerance: Patient limited by fatigue Patient left: with call bell/phone within reach;with family/visitor present;in bed Nurse Communication: Mobility status PT Visit Diagnosis: Other abnormalities of gait and mobility (R26.89);Muscle weakness (generalized) (M62.81);Difficulty in walking, not elsewhere classified (R26.2);Pain Pain - Right/Left:  (bil) Pain - part of body: Leg (abdomen back to bed)     Time: 4917-9150 PT Time Calculation (min) (ACUTE ONLY): 19 min  Charges:  $Therapeutic Activity: 8-22 mins                     Dalma Panchal R. PTA Acute Rehabilitation Services Office: [place] 08/04/2021, 12:07 PM

## 2021-08-04 NOTE — Progress Notes (Signed)
Clean lower abdo. Right and left with soap and water and reapplied InterDry cloth. Clean under bilateral breast with soap and water and  reapplied InterDry cloth. Patient has small open area under left breast pink in color. Patient  lower abdo incision slightly coming apart. Has steri stripes on it. No drainage. Colletta [place] R.N. aware and into assess patient.

## 2021-08-04 NOTE — Progress Notes (Signed)
Requested to see pt for out-pt HD needs at d/c. Communicated with CSW to confirm d/c plan is for snf placement. SNF is known at this time. Can assist with clinic placement once snf placement is known in order to place pt at clinic that snf will transport pt to. Will follow and assist as needed.    Melven Sartorius Renal Navigator [phone]

## 2021-08-04 NOTE — Progress Notes (Signed)
[facility] Progress Note   Subjective:  Patient seen and examined on HD. Had same issues with temp line. Did cathflo earlier which briefly worked especially with positioning and flushes (during the time of my evaluation). Received call from HD staff shortly thereafter that given arterial pressures. Patient tolerated HD during that time, had some discomfort with having to her lay her head back. Objective Vitals:   08/04/21 0910 08/04/21 0929 08/04/21 1113 08/04/21 1226  BP: (!) 100/58 107/69 103/74   Pulse: 86 97 (!) 102 85  Resp: 15 16 16    Temp:  (!) 97.3 F (36.3 C) (!) 97.4 F (36.3 C)   TempSrc:  Oral Oral   SpO2: 98% 99% 100%   Weight:      Height:       Physical Exam General:  NAD, sitting up in bed Heart: RRR Lungs: clear ant, no rales Abdomen: soft, midline dressing c/d/I, JP drain in place Extremities: min edema Dialysis Access:  RIJ Temp HD cath  Additional Objective Labs: Basic Metabolic Panel: Recent Labs  Lab 07/31/21 0229 08/01/21 0440 08/02/21 0742 08/03/21 0458 08/04/21 0450  NA 140 137 139 141 140  K 4.4 4.2 4.2 4.0 4.0  CL 96* 101 98 104 108  CO2 19* 24 23 22 23   GLUCOSE 82 98 108* 102* 104*  BUN 55* 62* 68* 73* 69*  CREATININE 6.57* 7.27* 8.21* 9.46* 8.76*  CALCIUM 8.5* 8.4* 8.2* 8.0* 8.0*  PHOS 7.8* 8.0* 7.5*  --   --    Liver Function Tests: Recent Labs  Lab 08/01/21 0440 08/02/21 0742 08/04/21 0450  AST  --   --  17  ALT  --   --  16  ALKPHOS  --   --  42  BILITOT  --   --  0.8  PROT  --   --  5.2*  ALBUMIN 1.8* 1.7* 1.6*   No results for input(s): "LIPASE", "AMYLASE" in the last 168 hours.  CBC: Recent Labs  Lab 07/29/21 0650 07/30/21 0741 07/31/21 0229 08/02/21 0742 08/03/21 0458 08/04/21 0450  WBC 11.7* 14.7* 12.5* 10.8* 11.3* 8.5  NEUTROABS 9.0*  --   --   --   --   --   HGB 9.2* 10.8* 10.5* 7.8* 7.8* 7.0*  HCT 27.7* 30.7* 30.7* 22.8* 22.8* 21.6*  MCV 93.0 90.0 91.6 93.4 93.4 96.0  PLT 273 364 391  332 321 272   Blood Culture    Component Value Date/Time   SDES BLOOD RIGHT ANTECUBITAL 07/21/2021 1915   SPECREQUEST  07/21/2021 1915    BOTTLES DRAWN AEROBIC ONLY Blood Culture results may not be optimal due to an inadequate volume of blood received in culture bottles   CULT  07/21/2021 1915    NO GROWTH 5 DAYS Performed at [facility], [place] [address], [place], [place] [address]    REPTSTATUS 07/26/2021 FINAL 07/21/2021 1915    Cardiac Enzymes: No results for input(s): "CKTOTAL", "CKMB", "CKMBINDEX", "TROPONINI" in the last 168 hours.  CBG: Recent Labs  Lab 08/03/21 2008 08/03/21 2111 08/04/21 0003 08/04/21 0422 08/04/21 1108  GLUCAP 67* 93 102* 90 92   Iron Studies:  No results for input(s): "IRON", "TIBC", "TRANSFERRIN", "FERRITIN" in the last 72 hours.  @lablastinr3 @ Studies/Results: IR Fluoro Guide CV Line Right  Result Date: 08/04/2021 INDICATION: Poorly functioning temporary dialysis catheter. Request made for conversion of temporary dialysis catheter to a tunneled dialysis catheter for continuation of hemodialysis. EXAM: FLUOROSCOPIC GUIDED CONVERSION OF NON  TUNNELED TO TUNNELED HEMODIALYSIS CATHETER COMPARISON:  Temporary dialysis catheter exchange-08/01/2021 Temporary hemodialysis catheter placement-07/30/2018 MEDICATIONS: None ANESTHESIA/SEDATION: Moderate (conscious) sedation was employed during this procedure as administered by the Interventional Radiology RN. A total of Versed 0.5 mg and Fentanyl 25 mcg was administered intravenously. Moderate Sedation Time: 12 minutes. The patient's level of consciousness and vital signs were monitored continuously by radiology nursing throughout the procedure under my direct supervision. FLUOROSCOPY TIME:  54 seconds (3 mGy) COMPLICATIONS: None immediate. PROCEDURE: Informed written consent was obtained from the patient after a discussion of the risks, benefits, and alternatives to treatment. Questions regarding the  procedure were encouraged and answered. The skin and external portion of the existing hemodialysis catheter was prepped with chlorhexidine in a sterile fashion, and a sterile drape was applied covering the operative field. Maximum barrier sterile technique with sterile gowns and gloves were used for the procedure. A timeout was performed prior to the initiation of the procedure. Preprocedural spot fluoroscopic image demonstrates abrupt kinking of the temporary dialysis catheter at the level of the venotomy site, likely the etiology of the dialysis catheter malfunction. A lumen of the non tunneled temporary dialysis catheter was cannulated with a stiff Glidewire and utilized for measurement purposes. Next, the stiff Glidewire was advanced to the level of the IVC. A 19 cm tip to cuff palindrome dialysis catheter was tunneled from a site along the anterior chest to the venotomy site. Under intermittent fluoroscopic guidance, the temporary dialysis catheter was exchanged for a peel-away sheath. The tunneled dialysis catheter was inserted into the peel-away sheath with tips ultimately terminating within the superior aspect of the right atrium. Final catheter positioning was confirmed and documented with a spot fluoroscopic image. The catheter aspirates and flushes normally. The catheter was flushed with appropriate volume heparin dwells. The catheter exit site was secured with a 0-Prolene retention sutures. The venotomy site was apposed with Dermabond and Steri-Strips. Both lumens were heparinized. A dressing was applied. The patient tolerated the procedure well without immediate post procedural complication. IMPRESSION: Successful fluoroscopic guided conversion of a temporary to a permanent 19 cm tip to cuff tunneled hemodialysis catheter with tips terminating within the superior aspect of the right atrium. The new tunneled dialysis catheter is ready for immediate use. Electronically Signed   By: Sandi Mariscal M.D.   On:  08/04/2021 09:30   Medications:  sodium chloride 10 mL/hr at 07/27/21 0654    Chlorhexidine Gluconate Cloth  6 each Topical Daily   Chlorhexidine Gluconate Cloth  6 each Topical Q0600   [START ON 08/05/2021] darbepoetin (ARANESP) injection - DIALYSIS  100 mcg Intravenous Q Fri-HD   feeding supplement (NEPRO CARB STEADY)  237 mL Oral BID BM   heparin injection (subcutaneous)  5,000 Units Subcutaneous Q8H   metoprolol tartrate  25 mg Oral BID   multivitamin  1 tablet Oral QHS   neomycin-bacitracin-polymyxin   Topical TID   pantoprazole  40 mg Oral Daily   polycarbophil  625 mg Oral BID   sevelamer carbonate  800 mg Oral TID WC   sodium chloride flush  10-40 mL Intracatheter Q12H   sodium chloride flush  3 mL Intravenous Q12H    Assessment/Plan: 1.  Acute kidney injury chronic kidney disease stage IIIa: Appears consistent with ATN of septic shock.  Initiated HD 7/10 for worsening azotemia/metabolic acidosis. Hopefully will be temporary HD until she has renal recovery. Labs indicate will have ongoing dialysis needs.  Purewick hasn't been great at capturing urine volume.  IR placed RIJ temp HD catheter 7/14, exchanged 7/17 due to catheter malfntn-still didn't work. S/p [facility] placement 7/20 w/ IR -HD tentatively planned for today, next treatment Sat -Unfortunately, no signs of renal recovery yet -CLIP for outpatient HD placement (AKI) -Avoid nephrotoxic medications including NSAIDs and iodinated intravenous contrast exposure unless the latter is absolutely indicated.  Preferred narcotic agents for pain control are hydromorphone, fentanyl, and methadone. Morphine should not be used. Avoid Baclofen and avoid oral sodium phosphate and magnesium citrate based laxatives / bowel preps. Continue strict Input and Output monitoring. Will monitor the patient closely with you and intervene or adjust therapy as indicated by changes in clinical status/labs.  2.  Incarcerated ventral hernia: Status post  exploratory laparotomy with small bowel resection, adhesiolysis and hernia repair with JP drain placement (removed 7/16).  Ongoing follow-up by surgery. 3. Hyperphosphatemia: phos 8, follow - secondary to AKI: started renvela, po4 down to 7.5 on 7/18 4.  Peritonitis: With resolution of septic shock, weaned off of pressors and remains on corticosteroids.  completed course of Zosyn. 5.  Anemia: Possibly chronic from malabsorption given prior history of small bowel resection.  Exacerbated by acute/critical illness and perioperative blood losses.  Iron sat 14, ferrritin 162; received total of 500mg  of ferrlecit, last dose 7/15.  Hb in the 10.5 on 7/16 to 7.0 this AM. W/u per primary. ESA to start 7/20

## 2021-08-05 DIAGNOSIS — N179 Acute kidney failure, unspecified: Secondary | ICD-10-CM | POA: Diagnosis not present

## 2021-08-05 DIAGNOSIS — E860 Dehydration: Secondary | ICD-10-CM | POA: Diagnosis not present

## 2021-08-05 DIAGNOSIS — Z8719 Personal history of other diseases of the digestive system: Secondary | ICD-10-CM | POA: Diagnosis not present

## 2021-08-05 DIAGNOSIS — K436 Other and unspecified ventral hernia with obstruction, without gangrene: Secondary | ICD-10-CM | POA: Diagnosis not present

## 2021-08-05 LAB — GLUCOSE, CAPILLARY
Glucose-Capillary: 110 mg/dL — ABNORMAL HIGH (ref 70–99)
Glucose-Capillary: 51 mg/dL — ABNORMAL LOW (ref 70–99)
Glucose-Capillary: 78 mg/dL (ref 70–99)
Glucose-Capillary: 78 mg/dL (ref 70–99)
Glucose-Capillary: 85 mg/dL (ref 70–99)
Glucose-Capillary: 90 mg/dL (ref 70–99)
Glucose-Capillary: 91 mg/dL (ref 70–99)

## 2021-08-05 LAB — BASIC METABOLIC PANEL
Anion gap: 11 (ref 5–15)
BUN: 31 mg/dL — ABNORMAL HIGH (ref 8–23)
CO2: 26 mmol/L (ref 22–32)
Calcium: 8 mg/dL — ABNORMAL LOW (ref 8.9–10.3)
Chloride: 101 mmol/L (ref 98–111)
Creatinine, Ser: 4.59 mg/dL — ABNORMAL HIGH (ref 0.44–1.00)
GFR, Estimated: 10 mL/min — ABNORMAL LOW (ref >60)
Glucose, Bld: 97 mg/dL (ref 70–99)
Potassium: 4.1 mmol/L (ref 3.5–5.1)
Sodium: 138 mmol/L (ref 135–145)

## 2021-08-05 LAB — CBC
HCT: 20.1 % — ABNORMAL LOW (ref 36.0–46.0)
Hemoglobin: 6.8 g/dL — CL (ref 12.0–15.0)
MCH: 31.9 pg (ref 26.0–34.0)
MCHC: 33.8 g/dL (ref 30.0–36.0)
MCV: 94.4 fL (ref 80.0–100.0)
Platelets: 225 10*3/uL (ref 150–400)
RBC: 2.13 MIL/uL — ABNORMAL LOW (ref 3.87–5.11)
RDW: 18.4 % — ABNORMAL HIGH (ref 11.5–15.5)
WBC: 7 10*3/uL (ref 4.0–10.5)
nRBC: 0 % (ref 0.0–0.2)

## 2021-08-05 LAB — PREPARE RBC (CROSSMATCH)

## 2021-08-05 MED ORDER — SODIUM CHLORIDE 0.9% IV SOLUTION: Freq: Once | INTRAVENOUS | Status: DC

## 2021-08-05 NOTE — TOC Progression Note (Signed)
Transition of Care [facility]) - Progression Note    Patient Details  Name: [name] MRN: [id number] Date of Birth: 1955/03/12  Transition of Care [facility]) CM/SW [place], [place] Phone Number: 08/05/2021, 2:46 PM  Clinical Narrative:     CSW spoke with [facility]- she confirmed coverage for dialysis would be a barrier there for placement since SNF does not provide transportation. CSW  informed family of concerns with transportation to dialysis, family states they are unable to provide transportation. They believe it is best for them to select another SNF that also provide transportation. CSW provided family with bed offers. They will review and update weekend CSW or inform patient's RN of their SNF choice.   TOC will continue to follow and assist with discharge planning.    Expected Discharge Plan: [place] Barriers to Discharge: Continued Medical Work up  Expected Discharge Plan and Services Expected Discharge Plan: [place] arrangements for the past 2 months: Single Family Home                                       Social Determinants of Health (SDOH) Interventions    Readmission Risk Interventions     No data to display

## 2021-08-05 NOTE — Progress Notes (Signed)
Physical Therapy Treatment Patient Details Name: [name] MRN: [id number] DOB: 1955/02/04 Today's Date: 08/05/2021   History of Present Illness Pt is a 66 y.o. female admitted 07/21/21 with worsening abdominal pain. S/p emergent ex lap, small bowel resection, adhesiolysis x90 min, incarcerated ventral hernia repair with JP drain placement on 7/7. ETT 7/7-7/11. Course complicated by AKI on CKD 3; HD initiated 7/10. NGT removed 7/13. Temp catheter not working in HD 7/14; to IR for exchange. PMH includes recurrent ventral hernia s/p hernia and small bowel repairs, HTN.    PT Comments    Pt with good progress towards goals this session. Pt demonstrating ambulation in room with min assist throughout to steady, however pt continues to fatigue quickly secondary to LE weakness. Pt with fair tolerance for LE therex for increased ROM and strength, with pt verbalizing understanding re; education/encouragement for continued exercise throughout day. Current plan remains appropriate to address deficits and maximize functional independence and decrease caregiver burden. Pt continues to benefit from skilled PT services to progress toward functional mobility goals.    Recommendations for follow up therapy are one component of a multi-disciplinary discharge planning process, led by the attending physician.  Recommendations may be updated based on patient status, additional functional criteria and insurance authorization.  Follow Up Recommendations  Skilled nursing-short term rehab (<3 hours/day) (pt declined) Can patient physically be transported by private vehicle:  (TBD)   Assistance Recommended at Discharge Frequent or constant Supervision/Assistance  Patient can return home with the following A lot of help with walking and/or transfers;A lot of help with bathing/dressing/bathroom;Assistance with cooking/housework;Assist for transportation;Help with stairs or ramp for entrance   Equipment Recommendations   Rolling walker (2 wheels);BSC/3in1;Wheelchair (measurements PT);Wheelchair cushion (measurements PT)    Recommendations for Other Services       Precautions / Restrictions Precautions Precautions: Fall;Other (comment) Precaution Comments: watch HR Restrictions Weight Bearing Restrictions: No     Mobility  Bed Mobility Overal bed mobility: Needs Assistance Bed Mobility: Supine to Sit     Supine to sit: Min guard     General bed mobility comments: min gaurd for safety    Transfers Overall transfer level: Needs assistance Equipment used: Rolling walker (2 wheels) Transfers: Sit to/from Stand, Bed to chair/wheelchair/BSC Sit to Stand: Min assist           General transfer comment: min a to steady on rise    Ambulation/Gait Ambulation/Gait assistance: Min assist Gait Distance (Feet): 6 Feet Assistive device: Rolling walker (2 wheels) Gait Pattern/deviations: Step-through pattern, Decreased stride length, Antalgic Gait velocity: decr     General Gait Details: slow antaglic gait in room, pt needing x1 standing recovery. no LOB noted, distance limited by fatigue   Stairs             Wheelchair Mobility    Modified Rankin (Stroke Patients Only)       Balance Overall balance assessment: Needs assistance Sitting-balance support: No upper extremity supported, Single extremity supported Sitting balance-Leahy Scale: Good     Standing balance support: Reliant on assistive device for balance Standing balance-Leahy Scale: Poor Standing balance comment: BUE reliance on RW                            Cognition Arousal/Alertness: Awake/alert Behavior During Therapy: Flat affect Overall Cognitive Status: Within Functional Limits for tasks assessed  Exercises General Exercises - Lower Extremity Quad Sets: AROM, Right, Left, 10 reps Gluteal Sets: AROM, Both, 10 reps Hip  ABduction/ADduction: AROM, Right, Left, 10 reps    General Comments        Pertinent Vitals/Pain Pain Assessment Pain Assessment: Faces Faces Pain Scale: Hurts a little bit Pain Location: abdomen Pain Descriptors / Indicators: Discomfort Pain Intervention(s): Monitored during session, Limited activity within patient's tolerance    Home Living                          Prior Function            PT Goals (current goals can now be found in the care plan section) Acute Rehab PT Goals PT Goal Formulation: With patient Time For Goal Achievement: 08/10/21    Frequency    Min 3X/week      PT Plan Current plan remains appropriate    Co-evaluation              AM-PAC PT "6 Clicks" Mobility   Outcome Measure  Help needed turning from your back to your side while in a flat bed without using bedrails?: A Lot Help needed moving from lying on your back to sitting on the side of a flat bed without using bedrails?: A Lot Help needed moving to and from a bed to a chair (including a wheelchair)?: A Lot Help needed standing up from a chair using your arms (e.g., wheelchair or bedside chair)?: A Lot Help needed to walk in hospital room?: A Little Help needed climbing 3-5 steps with a railing? : Total 6 Click Score: 12    End of Session   Activity Tolerance: Patient limited by fatigue;Patient tolerated treatment well Patient left: with call bell/phone within reach;in chair Nurse Communication: Mobility status PT Visit Diagnosis: Other abnormalities of gait and mobility (R26.89);Muscle weakness (generalized) (M62.81);Difficulty in walking, not elsewhere classified (R26.2);Pain Pain - Right/Left:  (bil) Pain - part of body: Leg (abdomen back to bed)     Time: 1202-1225 PT Time Calculation (min) (ACUTE ONLY): 23 min  Charges:  $Gait Training: 8-22 mins $Therapeutic Exercise: 8-22 mins                     Garet Hooton R. PTA Acute Rehabilitation Services Office:  [place] 08/05/2021, 12:55 PM

## 2021-08-05 NOTE — Progress Notes (Signed)
[name] Progress Note   Subjective:  Patient seen and examined bedside. No complaints. [place]el[place] HD yesterday, no UF Objective Vitals:   08/04/21 2300 08/05/21 0438 08/05/21 0820 08/05/21 1054  BP: (!) 93/52 (!) 92/48 (!) 94/54 (!) 117/58  Pulse: 81 88 92   Resp: 18 18 20 18   Temp: 97.8 F (36.6 C) 97.9 F (36.6 C) (!) 97.4 F (36.3 C) 98.8 F (37.1 C)  TempSrc: Oral Oral Oral Oral  SpO2: 99% 98% 100% 100%  Weight:      Height:       Physical Exam General:  NAD, sitting up in bed Heart: RRR Lungs: clear ant, no rales Abdomen: soft, midline dressing c/d/I Extremities: min edema Dialysis Access:  RIJ Temp HD cath  Additional Objective Labs: Basic Metabolic Panel: Recent Labs  Lab 08/01/21 0440 08/02/21 0742 08/03/21 0458 08/04/21 0450 08/04/21 1638 08/05/21 0212  NA 137 139   < > 140 138 138  K 4.2 4.2   < > 4.0 3.0* 4.1  CL 101 98   < > 108 97* 101  CO2 24 23   < > 23 29 26   GLUCOSE 98 108*   < > 104* 95 97  BUN 62* 68*   < > 69* 10 31*  CREATININE 7.27* 8.21*   < > 8.76* 1.51* 4.59*  CALCIUM 8.4* 8.2*   < > 8.0* 8.2* 8.0*  PHOS 8.0* 7.5*  --   --  1.3*  --    < > = values in this interval not displayed.   Liver Function Tests: Recent Labs  Lab 08/02/21 0742 08/04/21 0450 08/04/21 1638  AST  --  17  --   ALT  --  16  --   ALKPHOS  --  42  --   BILITOT  --  0.8  --   PROT  --  5.2*  --   ALBUMIN 1.7* 1.6* 2.0*   No results for input(s): "LIPASE", "AMYLASE" in the last 168 hours.  CBC: Recent Labs  Lab 07/31/21 0229 08/02/21 0742 08/03/21 0458 08/04/21 0450 08/05/21 0212  WBC 12.5* 10.8* 11.3* 8.5 7.0  HGB 10.5* 7.8* 7.8* 7.0* 6.8*  HCT 30.7* 22.8* 22.8* 21.6* 20.1*  MCV 91.6 93.4 93.4 96.0 94.4  PLT 391 332 321 272 225   Blood Culture    Component Value Date/Time   SDES BLOOD RIGHT ANTECUBITAL 07/21/2021 1915   SPECREQUEST  07/21/2021 1915    BOTTLES DRAWN AEROBIC ONLY Blood Culture results may not be optimal due  to an inadequate volume of blood received in culture bottles   CULT  07/21/2021 1915    NO GROWTH 5 DAYS Performed at [facility] [address], [place], [place] [address]    REPTSTATUS 07/26/2021 FINAL 07/21/2021 1915    Cardiac Enzymes: No results for input(s): "CKTOTAL", "CKMB", "CKMBINDEX", "TROPONINI" in the last 168 hours.  CBG: Recent Labs  Lab 08/04/21 2135 08/04/21 2237 08/05/21 0006 08/05/21 0438 08/05/21 0817  GLUCAP 57* 96 110* 78 90   Iron Studies:  No results for input(s): "IRON", "TIBC", "TRANSFERRIN", "FERRITIN" in the last 72 hours.  @lablastinr3 @ Studies/Results: IR Fluoro Guide CV Line Right  Result Date: 08/04/2021 INDICATION: Poorly functioning temporary dialysis catheter. Request made for conversion of temporary dialysis catheter to a tunneled dialysis catheter for continuation of hemodialysis. EXAM: FLUOROSCOPIC GUIDED CONVERSION OF NON TUNNELED TO TUNNELED HEMODIALYSIS CATHETER COMPARISON:  Temporary dialysis catheter exchange-08/01/2021 Temporary hemodialysis catheter placement-07/30/2018 MEDICATIONS: None ANESTHESIA/SEDATION: Moderate (  conscious) sedation was employed during this procedure as administered by the Interventional Radiology RN. A total of Versed 0.5 mg and Fentanyl 25 mcg was administered intravenously. Moderate Sedation Time: 12 minutes. The patient's level of consciousness and vital signs were monitored continuously by radiology nursing throughout the procedure under my direct supervision. FLUOROSCOPY TIME:  54 seconds (3 mGy) COMPLICATIONS: None immediate. PROCEDURE: Informed written consent was obtained from the patient after a discussion of the risks, benefits, and alternatives to treatment. Questions regarding the procedure were encouraged and answered. The skin and external portion of the existing hemodialysis catheter was prepped with chlorhexidine in a sterile fashion, and a sterile drape was applied covering the operative field.  Maximum barrier sterile technique with sterile gowns and gloves were used for the procedure. A timeout was performed prior to the initiation of the procedure. Preprocedural spot fluoroscopic image demonstrates abrupt kinking of the temporary dialysis catheter at the level of the venotomy site, likely the etiology of the dialysis catheter malfunction. A lumen of the non tunneled temporary dialysis catheter was cannulated with a stiff Glidewire and utilized for measurement purposes. Next, the stiff Glidewire was advanced to the level of the IVC. A 19 cm tip to cuff palindrome dialysis catheter was tunneled from a site along the anterior chest to the venotomy site. Under intermittent fluoroscopic guidance, the temporary dialysis catheter was exchanged for a peel-away sheath. The tunneled dialysis catheter was inserted into the peel-away sheath with tips ultimately terminating within the superior aspect of the right atrium. Final catheter positioning was confirmed and documented with a spot fluoroscopic image. The catheter aspirates and flushes normally. The catheter was flushed with appropriate volume heparin dwells. The catheter exit site was secured with a 0-Prolene retention sutures. The venotomy site was apposed with Dermabond and Steri-Strips. Both lumens were heparinized. A dressing was applied. The patient tolerated the procedure well without immediate post procedural complication. IMPRESSION: Successful fluoroscopic guided conversion of a temporary to a permanent 19 cm tip to cuff tunneled hemodialysis catheter with tips terminating within the superior aspect of the right atrium. The new tunneled dialysis catheter is ready for immediate use. Electronically Signed   By: Sandi Mariscal M.D.   On: 08/04/2021 09:30   Medications:  sodium chloride 10 mL/hr at 07/27/21 0654    Chlorhexidine Gluconate Cloth  6 each Topical Daily   Chlorhexidine Gluconate Cloth  6 each Topical Q0600   darbepoetin (ARANESP) injection  - DIALYSIS  100 mcg Intravenous Q Thu-HD   feeding supplement (NEPRO CARB STEADY)  237 mL Oral BID BM   heparin injection (subcutaneous)  5,000 Units Subcutaneous Q8H   metoprolol tartrate  25 mg Oral BID   multivitamin  1 tablet Oral QHS   neomycin-bacitracin-polymyxin   Topical TID   pantoprazole  40 mg Oral Daily   polycarbophil  625 mg Oral BID   sevelamer carbonate  800 mg Oral TID WC   sodium chloride flush  10-40 mL Intracatheter Q12H   sodium chloride flush  3 mL Intravenous Q12H    Assessment/Plan: 1.  Acute kidney injury chronic kidney disease stage IIIa: Appears consistent with ATN of septic shock.  Initiated HD 7/10 for worsening azotemia/metabolic acidosis. Hopefully will be temporary HD until she has renal recovery. Labs indicate will have ongoing dialysis needs.  Purewick hasn't been great at capturing urine volume.   IR placed RIJ temp HD catheter 7/14, exchanged 7/17 due to catheter malfntn-still didn't work. S/p TDC placement 7/20 w/ IR -  next treatment Sat -CLIP for outpatient HD placement (AKI). Hoping that she may recover relatively soon -Avoid nephrotoxic medications including NSAIDs and iodinated intravenous contrast exposure unless the latter is absolutely indicated.  Preferred narcotic agents for pain control are hydromorphone, fentanyl, and methadone. Morphine should not be used. Avoid Baclofen and avoid oral sodium phosphate and magnesium citrate based laxatives / bowel preps. Continue strict Input and Output monitoring. Will monitor the patient closely with you and intervene or adjust therapy as indicated by changes in clinical status/labs.  2.  Incarcerated ventral hernia: Status post exploratory laparotomy with small bowel resection, adhesiolysis and hernia repair with JP drain placement (removed 7/16).  Ongoing follow-up by surgery. 3. Hyperphosphatemia: phos 8, follow - secondary to AKI: started renvela, po4 down to 7.5 on 7/18, down to 1.3 7/20--stopping  binders 4.  Peritonitis: With resolution of septic shock, weaned off of pressors and remains on corticosteroids.  completed course of Zosyn. 5.  Anemia: Possibly chronic from malabsorption given prior history of small bowel resection.  Exacerbated by acute/critical illness and perioperative blood losses.  Iron sat 14, ferrritin 162; received total of 500mg  of ferrlecit, last dose 7/15.  Hb in the 10.5 on 7/16 to 6.8 this AM. W/u per primary. ESA start 7/20

## 2021-08-05 NOTE — Progress Notes (Signed)
[facility]  PROGRESS NOTE  Theadora Noyes GBE:[id number] DOB: 07-30-1955 DOA: 07/21/2021 PCP: Mancel Bale, PA-C   Brief HPI:   66 year old female with history of hypertension, CKD stage III a,hyperlipidemia, chronically incarcerated large abdominal wall hernia which was repaired multiple times, recent SBO resection with mesh placement who presented to the ER with severe abdominal pain, vomiting.  L CT abdomen/pelvis showed large SBO, large lower abdominal ventral hernia containing much of the small bowel.  General surgery consulted and she was taken to the OR for emergent exploratory laparotomy and hernia repair,she was transferred to ICU.  Patient was hemodynamically stable and patient transferred to [facility] service on 7/13.  On dialysis for persistent AKI, nephrology following.  General surgery were following.  PT recommending skilled nursing facility on discharge   Subjective   Patient seen and examined, no new complaints.     Assessment/Plan:    Incarcerated/strangulated ventral hernia:  -Status post small bowel resection, lysis of additions, repair of hernia and JP drain placement.   -Also found to have peritonitis.  Completed antibiotics course.    General surgery following.  -Patient on soft diet -Patient has poor p.o. intake, will consult dietitian for nutritional assessment   AKI on CKD stage 3A -Likely from ATN from septic shock secondary to peritonitis.   -Initiated hemodialysis on 7/10, nephrology following.  IR exchanged temporary dialysis catheter on the right IJ.   -IR was consulted to change to tunneled HD catheter -Patient will likely need hemodialysis as outpatient   Normocytic anemia:  -Likely associated with critical illness, chronic kidney disease, postop blood loss Low iron as per labs,given iv iron.   -Hemoglobin is down to 6.8 today -We will transfuse 1 unit PRBC with hemodialysis -Nephrology started patient on ESA -We will check stool for occult  blood   Intermittent sinus tachycardia - Unclear etiology.  Echo showed EF of more than 75%, no regional wall motion abnormality, grade 1 diastolic dysfunction, no valvular abnormality.   -Continue  metoprolol at 25 mg bid.   -TSH  5.359   History of hypertension -Blood pressure is soft -On amlodipine at home, currently on hold -Continue metoprolol as above   Acute hypoxic respiratory failure - Had to be put on ventilator after surgery, currently extubated and on room air   Obesity - BMI of 37.1    Debility/deconditioning - Patient seen by PT and recommended skilled nursing facility on discharge.TOC following     Medications     Chlorhexidine Gluconate Cloth  6 each Topical Daily   Chlorhexidine Gluconate Cloth  6 each Topical Q0600   darbepoetin (ARANESP) injection - DIALYSIS  100 mcg Intravenous Q Thu-HD   feeding supplement (NEPRO CARB STEADY)  237 mL Oral BID BM   heparin injection (subcutaneous)  5,000 Units Subcutaneous Q8H   metoprolol tartrate  25 mg Oral BID   multivitamin  1 tablet Oral QHS   neomycin-bacitracin-polymyxin   Topical TID   pantoprazole  40 mg Oral Daily   polycarbophil  625 mg Oral BID   sodium chloride flush  10-40 mL Intracatheter Q12H   sodium chloride flush  3 mL Intravenous Q12H     Data Reviewed:   CBG:  Recent Labs  Lab 08/04/21 2237 08/05/21 0006 08/05/21 0438 08/05/21 0817 08/05/21 1302  GLUCAP 96 110* 78 90 91    SpO2: 100 % O2 Flow Rate (L/min): 2 L/min FiO2 (%): 40 %    Vitals:   08/04/21 2300 08/05/21 0438 08/05/21  0820 08/05/21 1054  BP: (!) 93/52 (!) 92/48 (!) 94/54 (!) 117/58  Pulse: 81 88 92   Resp: 18 18 20 18   Temp: 97.8 F (36.6 C) 97.9 F (36.6 C) (!) 97.4 F (36.3 C) 98.8 F (37.1 C)  TempSrc: Oral Oral Oral Oral  SpO2: 99% 98% 100% 100%  Weight:      Height:          Data Reviewed:  Basic Metabolic Panel: Recent Labs  Lab 07/30/21 0741 07/31/21 0229 08/01/21 0440 08/02/21 0742  08/03/21 0458 08/04/21 0450 08/04/21 1638 08/05/21 0212  NA 137 140 137 139 141 140 138 138  K 4.3 4.4 4.2 4.2 4.0 4.0 3.0* 4.1  CL 98 96* 101 98 104 108 97* 101  CO2 19* 19* 24 23 22 23 29 26   GLUCOSE 88 82 98 108* 102* 104* 95 97  BUN 48* 55* 62* 68* 73* 69* 10 31*  CREATININE 5.75* 6.57* 7.27* 8.21* 9.46* 8.76* 1.51* 4.59*  CALCIUM 8.4* 8.5* 8.4* 8.2* 8.0* 8.0* 8.2* 8.0*  PHOS 6.7* 7.8* 8.0* 7.5*  --   --  1.3*  --     CBC: Recent Labs  Lab 07/31/21 0229 08/02/21 0742 08/03/21 0458 08/04/21 0450 08/05/21 0212  WBC 12.5* 10.8* 11.3* 8.5 7.0  HGB 10.5* 7.8* 7.8* 7.0* 6.8*  HCT 30.7* 22.8* 22.8* 21.6* 20.1*  MCV 91.6 93.4 93.4 96.0 94.4  PLT 391 332 321 272 225    LFT Recent Labs  Lab 07/31/21 0229 08/01/21 0440 08/02/21 0742 08/04/21 0450 08/04/21 1638  AST  --   --   --  17  --   ALT  --   --   --  16  --   ALKPHOS  --   --   --  42  --   BILITOT  --   --   --  0.8  --   PROT  --   --   --  5.2*  --   ALBUMIN 2.0* 1.8* 1.7* 1.6* 2.0*     Antibiotics: Anti-infectives (From admission, onward)    Start     Dose/Rate Route Frequency Ordered Stop   08/04/21 0845  ceFAZolin (ANCEF) IVPB 2g/100 mL premix        over 30 Minutes Intravenous Continuous PRN 08/04/21 0850 08/04/21 0845   08/04/21 0841  ceFAZolin (ANCEF) 2-4 GM/100ML-% IVPB       Note to Pharmacy: Peggyann Shoals: cabinet override      08/04/21 0841 08/04/21 0850   07/23/21 1730  piperacillin-tazobactam (ZOSYN) IVPB 2.25 g        2.25 g 100 mL/hr over 30 Minutes Intravenous Every 8 hours 07/23/21 1207 07/28/21 1019   07/22/21 0900  piperacillin-tazobactam (ZOSYN) IVPB 2.25 g  Status:  Discontinued        2.25 g 100 mL/hr over 30 Minutes Intravenous Every 8 hours 07/22/21 0833 07/23/21 1207   07/22/21 0830  piperacillin-tazobactam (ZOSYN) IVPB 3.375 g  Status:  Discontinued        3.375 g 100 mL/hr over 30 Minutes Intravenous Every 8 hours 07/22/21 0827 07/22/21 0831   07/22/21 0000   piperacillin-tazobactam (ZOSYN) IVPB 3.375 g        3.375 g 100 mL/hr over 30 Minutes Intravenous  Once 07/21/21 2352 07/22/21 0142        DVT prophylaxis: Heparin  Code Status: Full code  Family Communication: No family at bedside   CONSULTS nephrology   Objective  Physical Examination:   General-appears in no acute distress Heart-S1-S2, regular, no murmur auscultated Lungs-clear to auscultation bilaterally, no wheezing or crackles auscultated Abdomen-soft, nontender, no organomegaly Extremities-no edema in the lower extremities Neuro-alert, oriented x3, no focal deficit noted  Status is: Inpatient:          Oswald Hillock   [facility] If 7PM-7AM, please contact night-coverage at [email or web address], Office  [phone]   08/05/2021, 2:07 PM  LOS: 14 days

## 2021-08-05 NOTE — Progress Notes (Signed)
Advised by CSW that pt's family is interested in [place] snf at d/c. Contacted pt's sister, Stanton Kidney, via phone. Introduced self and explained role. Advised pt's sister that nephrologist feels pt will likely require HD at d/c and that a clinic needs to be arranged. Sister agreeable and prefers [place] if possible. Referral submitted to [facility] admissions for review. [facility] is close to [facility] as well. Will assist with out-pt HD needs.   Melven Sartorius Renal Navigator [phone]

## 2021-08-05 NOTE — Progress Notes (Addendum)
Nutrition Follow-up / Consult  DOCUMENTATION CODES:   Severe malnutrition in context of chronic illness  INTERVENTION:   Recommend stop phosphorus binder as phosphorus was low at 1.3 on 7/20.  Continue Nepro Shake po BID, each supplement provides 425 kcal and 19 grams protein.   Continue Magic cup TID with meals, each supplement provides 290 kcal and 9 grams of protein.  Renal MVI daily.  Patient is not appropriate for renal diet restriction or renal diet education at this time given severe malnutrition and poor oral intake.   RN changed diet order to room service with assist so patient will receive a meal even if she forgets to order.   NUTRITION DIAGNOSIS:   Severe Malnutrition related to chronic illness (chronic incarcerated hernia) as evidenced by severe muscle depletion, severe fat depletion.  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 Pt admitted with abd pain and vomiting due to SBO with large lower abd ventral wall hernia containing most of her small bowel.  Received consult for assessment d/t poor intake. Also received a consult for diet education. Patient is not appropriate for diet education or diet restrictions at this time, given severe malnutrition and ongoing poor intake. Currently on a dysphagia 3 diet with thin liquids, Nepro shake BID, Magic cup TID, renal MVI daily. Meal intakes: 0-40%, average 13% for the past 6 meals recorded. She has been refusing Nepro shakes.   Patient states she is eating okay. She has been sipping on the Nepro supplement provided by RN this morning. She eats the magic cup supplements on her trays sometimes. Encouraged  good intake of meals and snacks to promote healing and recovery.   Gueydan placed 7/20. Last HD 7/20. Next HD planned for Saturday.   Labs reviewed. Phos 1.3 (L) 7/20, has been receiving a phos binder with meals.  CBG: 110-78-90  Medications reviewed and include Aranesp, Rena-vit, Fibercon, Renvela.  7/10 weight 89.5 kg Current weight 81.9 kg (7/17) No new weights available d/t bed scale not working.  Diet Order:   Diet Order             DIET DYS 3 Room service appropriate? Yes with Assist; Fluid consistency: Thin  Diet effective now                   EDUCATION NEEDS:   Not appropriate for education at this time  Skin:  Skin Assessment:  (multiple skin tears; MASD)  Last BM:  7/20 type 6  Height:   Ht Readings from Last 1 Encounters:  07/22/21 5\' 1"  (1.549 m)    Weight:   Wt Readings from Last 1 Encounters:  09/13/17 97.8 kg     BMI:  Body mass index is 34.12 kg/m.  Estimated Nutritional Needs:   Kcal:  1600-1800  Protein:  80-100 grams  Fluid:  >1.6 L/day    Lucas Mallow RD, LDN, CNSC Please refer to Amion for contact information.

## 2021-08-06 DIAGNOSIS — Z8719 Personal history of other diseases of the digestive system: Secondary | ICD-10-CM | POA: Diagnosis not present

## 2021-08-06 DIAGNOSIS — N179 Acute kidney failure, unspecified: Secondary | ICD-10-CM | POA: Diagnosis not present

## 2021-08-06 DIAGNOSIS — E860 Dehydration: Secondary | ICD-10-CM | POA: Diagnosis not present

## 2021-08-06 DIAGNOSIS — K436 Other and unspecified ventral hernia with obstruction, without gangrene: Secondary | ICD-10-CM | POA: Diagnosis not present

## 2021-08-06 LAB — BPAM RBC
Blood Product Expiration Date: 202308022359
ISSUE DATE / TIME: 202307212329
Unit Type and Rh: 6200

## 2021-08-06 LAB — CBC
HCT: 26.1 % — ABNORMAL LOW (ref 36.0–46.0)
Hemoglobin: 9 g/dL — ABNORMAL LOW (ref 12.0–15.0)
MCH: 31.4 pg (ref 26.0–34.0)
MCHC: 34.5 g/dL (ref 30.0–36.0)
MCV: 90.9 fL (ref 80.0–100.0)
Platelets: 199 10*3/uL (ref 150–400)
RBC: 2.87 MIL/uL — ABNORMAL LOW (ref 3.87–5.11)
RDW: 19.7 % — ABNORMAL HIGH (ref 11.5–15.5)
WBC: 7.9 10*3/uL (ref 4.0–10.5)
nRBC: 0 % (ref 0.0–0.2)

## 2021-08-06 LAB — RENAL FUNCTION PANEL
Albumin: 1.9 g/dL — ABNORMAL LOW (ref 3.5–5.0)
Anion gap: 11 (ref 5–15)
BUN: 39 mg/dL — ABNORMAL HIGH (ref 8–23)
CO2: 26 mmol/L (ref 22–32)
Calcium: 8 mg/dL — ABNORMAL LOW (ref 8.9–10.3)
Chloride: 102 mmol/L (ref 98–111)
Creatinine, Ser: 5.68 mg/dL — ABNORMAL HIGH (ref 0.44–1.00)
GFR, Estimated: 8 mL/min — ABNORMAL LOW (ref >60)
Glucose, Bld: 99 mg/dL (ref 70–99)
Phosphorus: 4.8 mg/dL — ABNORMAL HIGH (ref 2.5–4.6)
Potassium: 3.9 mmol/L (ref 3.5–5.1)
Sodium: 139 mmol/L (ref 135–145)

## 2021-08-06 LAB — TYPE AND SCREEN
ABO/RH(D): A POS
Antibody Screen: NEGATIVE
Unit division: 0

## 2021-08-06 LAB — GLUCOSE, CAPILLARY
Glucose-Capillary: 103 mg/dL — ABNORMAL HIGH (ref 70–99)
Glucose-Capillary: 81 mg/dL (ref 70–99)
Glucose-Capillary: 85 mg/dL (ref 70–99)
Glucose-Capillary: 94 mg/dL (ref 70–99)
Glucose-Capillary: 96 mg/dL (ref 70–99)

## 2021-08-06 NOTE — TOC Progression Note (Addendum)
Transition of Care [facility]) - Progression Note    Patient Details  Name: [name] MRN: [id number] Date of Birth: July 26, 1955  Transition of [facility] CM/SW [place], LCSW Phone Number:[phone] 08/06/2021, 2:55 PM  Clinical Narrative:     CSW attempted to reach pt's sister Stanton Kidney for SNF choice. CSW was unable to reach [facility].  3:10 PM_ CSW received a phone call from [facility] and [place] inquired about SNF choice. CSW discussed the current bed offers. Mary asked about [facility] and CSW explained that they were unable to provide transportation to HD.Stanton Kidney stated that [facility] was close to family and that was the facilty of choice. Stanton Kidney explained that family could transport to HD.CSW verified this twice with Stanton Kidney doing it being noted previously that family was unable to provide transportation.  CSW spoke with Kia at [facility] and she explained that should have to check with Juliann Pulse at facility to confirm that process. Kia suggested that we follow up with Juliann Pulse on Monday to confirm bed for pt and that the logistic of the HD being worked out.  TOC team will continue to assist with discharge planning needs.     Expected Discharge Plan: [place] Barriers to Discharge: Continued Medical Work up  Expected Discharge Plan and Services Expected Discharge Plan: [place] arrangements for the past 2 months: Single Family Home                                       Social Determinants of Health (SDOH) Interventions    Readmission Risk Interventions     No data to display

## 2021-08-06 NOTE — Progress Notes (Signed)
[facility]  PROGRESS NOTE  [name] PYP:[id number] DOB: December 22, 1955 DOA: 07/21/2021 PCP: Mancel Bale, PA-C   Brief HPI:   66 year old female with history of hypertension, CKD stage III a,hyperlipidemia, chronically incarcerated large abdominal wall hernia which was repaired multiple times, recent SBO resection with mesh placement who presented to the ER with severe abdominal pain, vomiting.  L CT abdomen/pelvis showed large SBO, large lower abdominal ventral hernia containing much of the small bowel.  General surgery consulted and she was taken to the OR for emergent exploratory laparotomy and hernia repair,she was transferred to ICU.  Patient was hemodynamically stable and patient transferred to [facility] service on 7/13.  On dialysis for persistent AKI, nephrology following.  General surgery were following.  PT recommending skilled nursing facility on discharge   Subjective   Patient seen and examined, no new complaints.     Assessment/Plan:    Incarcerated/strangulated ventral hernia:  -Status post small bowel resection, lysis of additions, repair of hernia and JP drain placement.   -Also found to have peritonitis.  Completed antibiotics course.    General surgery following.  -Patient on soft diet    AKI on CKD stage 3A -Likely from ATN from septic shock secondary to peritonitis.   -Initiated hemodialysis on 7/10, nephrology following.  IR exchanged temporary dialysis catheter on the right IJ.   -IR was consulted to change to tunneled HD catheter -Patient will likely need hemodialysis as outpatient   Normocytic anemia:  -Likely associated with critical illness, chronic kidney disease, postop blood loss Low iron as per labs,given iv iron.   -Hemoglobin was down to 6.8 -S/p 1 unit PRBC -Hemoglobin has improved to 9.0   Intermittent sinus tachycardia - Unclear etiology.  Echo showed EF of more than 75%, no regional wall motion abnormality, grade 1 diastolic  dysfunction, no valvular abnormality.   -Continue  metoprolol at 25 mg bid.   -TSH  5.359   History of hypertension -Blood pressure is soft -On amlodipine at home, currently on hold -Continue metoprolol as above   Acute hypoxic respiratory failure - Had to be put on ventilator after surgery, currently extubated and on room air   Obesity - BMI of 37.1    Debility/deconditioning - Patient seen by PT and recommended skilled nursing facility on discharge.TOC following     Medications     sodium chloride   Intravenous Once   Chlorhexidine Gluconate Cloth  6 each Topical Daily   Chlorhexidine Gluconate Cloth  6 each Topical Q0600   darbepoetin (ARANESP) injection - DIALYSIS  100 mcg Intravenous Q Thu-HD   feeding supplement (NEPRO CARB STEADY)  237 mL Oral BID BM   heparin injection (subcutaneous)  5,000 Units Subcutaneous Q8H   metoprolol tartrate  25 mg Oral BID   multivitamin  1 tablet Oral QHS   neomycin-bacitracin-polymyxin   Topical TID   pantoprazole  40 mg Oral Daily   polycarbophil  625 mg Oral BID   sodium chloride flush  10-40 mL Intracatheter Q12H   sodium chloride flush  3 mL Intravenous Q12H     Data Reviewed:   CBG:  Recent Labs  Lab 08/05/21 2131 08/06/21 0055 08/06/21 0454 08/06/21 1016 08/06/21 1241  GLUCAP 85 85 96 94 103*    SpO2: 95 % O2 Flow Rate (L/min): 2 L/min FiO2 (%): 40 %    Vitals:   08/06/21 0330 08/06/21 0855 08/06/21 0910 08/06/21 1242  BP: 100/64  118/68 112/65  Pulse: 78  85  79  Resp: (!) 22  18 18   Temp: 98.2 F (36.8 C)  98.2 F (36.8 C) 98.1 F (36.7 C)  TempSrc: Oral  Oral Oral  SpO2: 100% 100% 100% 95%  Weight:      Height:          Data Reviewed:  Basic Metabolic Panel: Recent Labs  Lab 07/31/21 0229 08/01/21 0440 08/02/21 0742 08/03/21 0458 08/04/21 0450 08/04/21 1638 08/05/21 0212 08/06/21 0720  NA 140 137 139 141 140 138 138 139  K 4.4 4.2 4.2 4.0 4.0 3.0* 4.1 3.9  CL 96* 101 98 104 108 97*  101 102  CO2 19* 24 23 22 23 29 26 26   GLUCOSE 82 98 108* 102* 104* 95 97 99  BUN 55* 62* 68* 73* 69* 10 31* 39*  CREATININE 6.57* 7.27* 8.21* 9.46* 8.76* 1.51* 4.59* 5.68*  CALCIUM 8.5* 8.4* 8.2* 8.0* 8.0* 8.2* 8.0* 8.0*  PHOS 7.8* 8.0* 7.5*  --   --  1.3*  --  4.8*    CBC: Recent Labs  Lab 08/02/21 0742 08/03/21 0458 08/04/21 0450 08/05/21 0212 08/06/21 0720  WBC 10.8* 11.3* 8.5 7.0 7.9  HGB 7.8* 7.8* 7.0* 6.8* 9.0*  HCT 22.8* 22.8* 21.6* 20.1* 26.1*  MCV 93.4 93.4 96.0 94.4 90.9  PLT 332 321 272 225 199    LFT Recent Labs  Lab 08/01/21 0440 08/02/21 0742 08/04/21 0450 08/04/21 1638 08/06/21 0720  AST  --   --  17  --   --   ALT  --   --  16  --   --   ALKPHOS  --   --  42  --   --   BILITOT  --   --  0.8  --   --   PROT  --   --  5.2*  --   --   ALBUMIN 1.8* 1.7* 1.6* 2.0* 1.9*     Antibiotics: Anti-infectives (From admission, onward)    Start     Dose/Rate Route Frequency Ordered Stop   08/04/21 0845  ceFAZolin (ANCEF) IVPB 2g/100 mL premix        over 30 Minutes Intravenous Continuous PRN 08/04/21 0850 08/04/21 0845   08/04/21 0841  ceFAZolin (ANCEF) 2-4 GM/100ML-% IVPB       Note to Pharmacy: Peggyann Shoals: cabinet override      08/04/21 0841 08/04/21 0850   07/23/21 1730  piperacillin-tazobactam (ZOSYN) IVPB 2.25 g        2.25 g 100 mL/hr over 30 Minutes Intravenous Every 8 hours 07/23/21 1207 07/28/21 1019   07/22/21 0900  piperacillin-tazobactam (ZOSYN) IVPB 2.25 g  Status:  Discontinued        2.25 g 100 mL/hr over 30 Minutes Intravenous Every 8 hours 07/22/21 0833 07/23/21 1207   07/22/21 0830  piperacillin-tazobactam (ZOSYN) IVPB 3.375 g  Status:  Discontinued        3.375 g 100 mL/hr over 30 Minutes Intravenous Every 8 hours 07/22/21 0827 07/22/21 0831   07/22/21 0000  piperacillin-tazobactam (ZOSYN) IVPB 3.375 g        3.375 g 100 mL/hr over 30 Minutes Intravenous  Once 07/21/21 2352 07/22/21 0142        DVT prophylaxis:  Heparin  Code Status: Full code  Family Communication: No family at bedside   CONSULTS nephrology   Objective    Physical Examination:   General-appears in no acute distress Heart-S1-S2, regular, no murmur auscultated Lungs-clear to auscultation bilaterally, no wheezing or  crackles auscultated Abdomen-soft, nontender, no organomegaly Extremities-no edema in the lower extremities Neuro-alert, oriented x3, no focal deficit noted  Status is: Inpatient:          Oswald Hillock   [facility] If 7PM-7AM, please contact night-coverage at [email or web address], Office  [phone]   08/06/2021, 1:59 PM  LOS: 15 days

## 2021-08-06 NOTE — Progress Notes (Signed)
[facility] Progress Note   Subjective:  Patient seen and examined bedside. No complaints. Toelrated HD yesterday, no UF Objective Vitals:   08/06/21 0005 08/06/21 0330 08/06/21 0855 08/06/21 0910  BP: 104/65 100/64  118/68  Pulse: 93 78  85  Resp: 14 (!) 22  18  Temp: 97.9 F (36.6 C) 98.2 F (36.8 C)  98.2 F (36.8 C)  TempSrc: Oral Oral  Oral  SpO2: 99% 100% 100% 100%  Weight:      Height:       Physical Exam General:  NAD, sitting up in bed Heart: RRR Lungs: clear ant, no rales Abdomen: soft, midline dressing c/d/I Extremities: min edema Dialysis Access:  RIJ Temp HD cath  Additional Objective Labs: Basic Metabolic Panel: Recent Labs  Lab 08/02/21 0742 08/03/21 0458 08/04/21 1638 08/05/21 0212 08/06/21 0720  NA 139   < > 138 138 139  K 4.2   < > 3.0* 4.1 3.9  CL 98   < > 97* 101 102  CO2 23   < > 29 26 26   GLUCOSE 108*   < > 95 97 99  BUN 68*   < > 10 31* 39*  CREATININE 8.21*   < > 1.51* 4.59* 5.68*  CALCIUM 8.2*   < > 8.2* 8.0* 8.0*  PHOS 7.5*  --  1.3*  --  4.8*   < > = values in this interval not displayed.   Liver Function Tests: Recent Labs  Lab 08/04/21 0450 08/04/21 1638 08/06/21 0720  AST 17  --   --   ALT 16  --   --   ALKPHOS 42  --   --   BILITOT 0.8  --   --   PROT 5.2*  --   --   ALBUMIN 1.6* 2.0* 1.9*   No results for input(s): "LIPASE", "AMYLASE" in the last 168 hours.  CBC: Recent Labs  Lab 08/02/21 0742 08/03/21 0458 08/04/21 0450 08/05/21 0212 08/06/21 0720  WBC 10.8* 11.3* 8.5 7.0 7.9  HGB 7.8* 7.8* 7.0* 6.8* 9.0*  HCT 22.8* 22.8* 21.6* 20.1* 26.1*  MCV 93.4 93.4 96.0 94.4 90.9  PLT 332 321 272 225 199   Blood Culture    Component Value Date/Time   SDES BLOOD RIGHT ANTECUBITAL 07/21/2021 1915   SPECREQUEST  07/21/2021 1915    BOTTLES DRAWN AEROBIC ONLY Blood Culture results may not be optimal due to an inadequate volume of blood received in culture bottles   CULT  07/21/2021 1915    NO GROWTH  5 DAYS Performed at [facility], [place] [address], [place], [place] [address]    REPTSTATUS 07/26/2021 FINAL 07/21/2021 1915    Cardiac Enzymes: No results for input(s): "CKTOTAL", "CKMB", "CKMBINDEX", "TROPONINI" in the last 168 hours.  CBG: Recent Labs  Lab 08/05/21 1302 08/05/21 1625 08/05/21 2131 08/06/21 0055 08/06/21 0454  GLUCAP 91 78 85 85 96   Iron Studies:  No results for input(s): "IRON", "TIBC", "TRANSFERRIN", "FERRITIN" in the last 72 hours.  @lablastinr3 @ Studies/Results: No results found. Medications:  sodium chloride 10 mL/hr at 07/27/21 0654    sodium chloride   Intravenous Once   Chlorhexidine Gluconate Cloth  6 each Topical Daily   Chlorhexidine Gluconate Cloth  6 each Topical Q0600   darbepoetin (ARANESP) injection - DIALYSIS  100 mcg Intravenous Q Thu-HD   feeding supplement (NEPRO CARB STEADY)  237 mL Oral BID BM   heparin injection (subcutaneous)  5,000 Units Subcutaneous Q8H  metoprolol tartrate  25 mg Oral BID   multivitamin  1 tablet Oral QHS   neomycin-bacitracin-polymyxin   Topical TID   pantoprazole  40 mg Oral Daily   polycarbophil  625 mg Oral BID   sodium chloride flush  10-40 mL Intracatheter Q12H   sodium chloride flush  3 mL Intravenous Q12H    Assessment/Plan: 1.  Acute kidney injury chronic kidney disease stage IIIa: Appears consistent with ATN of septic shock.  Initiated HD 7/10 for worsening azotemia/metabolic acidosis. Hopefully will be temporary HD until she has renal recovery. Labs indicate will have ongoing dialysis needs.  Purewick hasn't been great at capturing urine volume.   IR placed RIJ temp HD catheter 7/14, exchanged 7/17 due to catheter malfntn-still didn't work. S/p [facility] placement 7/20 w/ IR - labs reviewed, no renal recovery yet. Next HD today. -CLIP for outpatient HD placement (AKI). Hoping that she may recover relatively soon -Avoid nephrotoxic medications including NSAIDs and iodinated intravenous contrast  exposure unless the latter is absolutely indicated.  Preferred narcotic agents for pain control are hydromorphone, fentanyl, and methadone. Morphine should not be used. Avoid Baclofen and avoid oral sodium phosphate and magnesium citrate based laxatives / bowel preps. Continue strict Input and Output monitoring. Will monitor the patient closely with you and intervene or adjust therapy as indicated by changes in clinical status/labs.  2.  Incarcerated ventral hernia: Status post exploratory laparotomy with small bowel resection, adhesiolysis and hernia repair with JP drain placement (removed 7/16).  Ongoing follow-up by surgery. 3. Hyperphosphatemia: secondary to AKI: started renvela, po4 down to 7.5 on 7/18, down to 1.3 7/20--stopped binders, PO4 4.8 on 7/22 4.  Peritonitis: With resolution of septic shock, weaned off of pressors and remains on corticosteroids.  completed course of Zosyn. 5.  Anemia: Possibly chronic from malabsorption given prior history of small bowel resection.  Exacerbated by acute/critical illness and perioperative blood losses.  Iron sat 14, ferrritin 162; received total of 500mg  of ferrlecit, last dose 7/15.  Hb dropped to 6.8 7/21, received transfusion, hgb 9 today. W/u per primary. ESA start 7/20

## 2021-08-07 DIAGNOSIS — E860 Dehydration: Secondary | ICD-10-CM | POA: Diagnosis not present

## 2021-08-07 DIAGNOSIS — Z8719 Personal history of other diseases of the digestive system: Secondary | ICD-10-CM | POA: Diagnosis not present

## 2021-08-07 DIAGNOSIS — N179 Acute kidney failure, unspecified: Secondary | ICD-10-CM | POA: Diagnosis not present

## 2021-08-07 DIAGNOSIS — K436 Other and unspecified ventral hernia with obstruction, without gangrene: Secondary | ICD-10-CM | POA: Diagnosis not present

## 2021-08-07 LAB — CBC
HCT: 28.3 % — ABNORMAL LOW (ref 36.0–46.0)
Hemoglobin: 9.7 g/dL — ABNORMAL LOW (ref 12.0–15.0)
MCH: 31 pg (ref 26.0–34.0)
MCHC: 34.3 g/dL (ref 30.0–36.0)
MCV: 90.4 fL (ref 80.0–100.0)
Platelets: 123 10*3/uL — ABNORMAL LOW (ref 150–400)
RBC: 3.13 MIL/uL — ABNORMAL LOW (ref 3.87–5.11)
RDW: 19.4 % — ABNORMAL HIGH (ref 11.5–15.5)
WBC: 9.7 10*3/uL (ref 4.0–10.5)
nRBC: 0.9 % — ABNORMAL HIGH (ref 0.0–0.2)

## 2021-08-07 LAB — GLUCOSE, CAPILLARY
Glucose-Capillary: 112 mg/dL — ABNORMAL HIGH (ref 70–99)
Glucose-Capillary: 68 mg/dL — ABNORMAL LOW (ref 70–99)
Glucose-Capillary: 72 mg/dL (ref 70–99)
Glucose-Capillary: 76 mg/dL (ref 70–99)
Glucose-Capillary: 84 mg/dL (ref 70–99)
Glucose-Capillary: 89 mg/dL (ref 70–99)
Glucose-Capillary: 92 mg/dL (ref 70–99)

## 2021-08-07 MED ORDER — DIPHENHYDRAMINE HCL 25 MG PO CAPS
25.0000 mg | ORAL_CAPSULE | Freq: Once | ORAL | Status: AC | PRN
Start: 2021-08-07 — End: 2021-08-07
  Administered 2021-08-07: 25 mg via ORAL
  Filled 2021-08-07: qty 1

## 2021-08-07 NOTE — Progress Notes (Signed)
[facility]  PROGRESS NOTE  [name] UJW:[id number] DOB: [birth year] DOA: 07/21/2021 PCP: Mancel Bale, PA-C   Brief HPI:   66 year old female with history of hypertension, CKD stage III a,hyperlipidemia, chronically incarcerated large abdominal wall hernia which was repaired multiple times, recent SBO resection with mesh placement who presented to the ER with severe abdominal pain, vomiting.  L CT abdomen/pelvis showed large SBO, large lower abdominal ventral hernia containing much of the small bowel.  General surgery consulted and she was taken to the OR for emergent exploratory laparotomy and hernia repair,she was transferred to ICU.  Patient was hemodynamically stable and patient transferred to [facility] service on 7/13.  On dialysis for persistent AKI, nephrology following.  General surgery were following.  PT recommending skilled nursing facility on discharge   Subjective   Patient seen and examined, no new complaints.  Patient to go to skilled nursing facility.   Assessment/Plan:    Incarcerated/strangulated ventral hernia:  -Status post small bowel resection, lysis of additions, repair of hernia and JP drain placement.   -JP and was removed on 07/31/2021 -Also found to have peritonitis.  Completed antibiotics course.    General surgery has signed off -Patient on soft diet    AKI on CKD stage 3A -Likely from ATN from septic shock secondary to peritonitis.   -Initiated hemodialysis on 7/10, nephrology following.  IR exchanged temporary dialysis catheter on the right IJ.   -IR was consulted to change to tunneled HD catheter -Patient will likely need hemodialysis as outpatient   Normocytic anemia:  -Likely associated with critical illness, chronic kidney disease, postop blood loss Low iron as per labs,given iv iron.   -Hemoglobin was down to 6.8 -S/p 1 unit PRBC -Hemoglobin has improved to 9.7   Intermittent sinus tachycardia - Unclear etiology.  Echo showed EF of  more than 75%, no regional wall motion abnormality, grade 1 diastolic dysfunction, no valvular abnormality.   -Continue  metoprolol at 25 mg bid.   -TSH  5.359   History of hypertension -Blood pressure is soft -On amlodipine at home, currently on hold -Continue metoprolol as above   Acute hypoxic respiratory failure - Had to be put on ventilator after surgery, currently extubated and on room air   Obesity - BMI of 37.1    Debility/deconditioning - Patient seen by PT and recommended skilled nursing facility on discharge.TOC following     Medications     sodium chloride   Intravenous Once   Chlorhexidine Gluconate Cloth  6 each Topical Daily   Chlorhexidine Gluconate Cloth  6 each Topical Q0600   darbepoetin (ARANESP) injection - DIALYSIS  100 mcg Intravenous Q Thu-HD   feeding supplement (NEPRO CARB STEADY)  237 mL Oral BID BM   heparin injection (subcutaneous)  5,000 Units Subcutaneous Q8H   metoprolol tartrate  25 mg Oral BID   multivitamin  1 tablet Oral QHS   neomycin-bacitracin-polymyxin   Topical TID   pantoprazole  40 mg Oral Daily   polycarbophil  625 mg Oral BID   sodium chloride flush  10-40 mL Intracatheter Q12H   sodium chloride flush  3 mL Intravenous Q12H     Data Reviewed:   CBG:  Recent Labs  Lab 08/07/21 0042 08/07/21 0122 08/07/21 0434 08/07/21 0846 08/07/21 1141  GLUCAP 68* 92 76 72 89    SpO2: 100 % O2 Flow Rate (L/min): 2 L/min FiO2 (%): 40 %    Vitals:   08/07/21 0849 08/07/21 0900 08/07/21 1056  08/07/21 1145  BP: 112/66   111/61  Pulse: (!) 116 90 100 88  Resp: 17 17  18   Temp: 98.3 F (36.8 C)   98.2 F (36.8 C)  TempSrc: Oral   Oral  SpO2: 100% 100%  100%  Weight:      Height:          Data Reviewed:  Basic Metabolic Panel: Recent Labs  Lab 08/01/21 0440 08/02/21 0742 08/03/21 0458 08/04/21 0450 08/04/21 1638 08/05/21 0212 08/06/21 0720  NA 137 139 141 140 138 138 139  K 4.2 4.2 4.0 4.0 3.0* 4.1 3.9  CL 101  98 104 108 97* 101 102  CO2 24 23 22 23 29 26 26   GLUCOSE 98 108* 102* 104* 95 97 99  BUN 62* 68* 73* 69* 10 31* 39*  CREATININE 7.27* 8.21* 9.46* 8.76* 1.51* 4.59* 5.68*  CALCIUM 8.4* 8.2* 8.0* 8.0* 8.2* 8.0* 8.0*  PHOS 8.0* 7.5*  --   --  1.3*  --  4.8*    CBC: Recent Labs  Lab 08/03/21 0458 08/04/21 0450 08/05/21 0212 08/06/21 0720 08/07/21 1030  WBC 11.3* 8.5 7.0 7.9 9.7  HGB 7.8* 7.0* 6.8* 9.0* 9.7*  HCT 22.8* 21.6* 20.1* 26.1* 28.3*  MCV 93.4 96.0 94.4 90.9 90.4  PLT 321 272 225 199 123*    LFT Recent Labs  Lab 08/01/21 0440 08/02/21 0742 08/04/21 0450 08/04/21 1638 08/06/21 0720  AST  --   --  17  --   --   ALT  --   --  16  --   --   ALKPHOS  --   --  42  --   --   BILITOT  --   --  0.8  --   --   PROT  --   --  5.2*  --   --   ALBUMIN 1.8* 1.7* 1.6* 2.0* 1.9*     Antibiotics: Anti-infectives (From admission, onward)    Start     Dose/Rate Route Frequency Ordered Stop   08/04/21 0845  ceFAZolin (ANCEF) IVPB 2g/100 mL premix        over 30 Minutes Intravenous Continuous PRN 08/04/21 0850 08/04/21 0845   08/04/21 0841  ceFAZolin (ANCEF) 2-4 GM/100ML-% IVPB       Note to Pharmacy: Peggyann Shoals: cabinet override      08/04/21 0841 08/04/21 0850   07/23/21 1730  piperacillin-tazobactam (ZOSYN) IVPB 2.25 g        2.25 g 100 mL/hr over 30 Minutes Intravenous Every 8 hours 07/23/21 1207 07/28/21 1019   07/22/21 0900  piperacillin-tazobactam (ZOSYN) IVPB 2.25 g  Status:  Discontinued        2.25 g 100 mL/hr over 30 Minutes Intravenous Every 8 hours 07/22/21 0833 07/23/21 1207   07/22/21 0830  piperacillin-tazobactam (ZOSYN) IVPB 3.375 g  Status:  Discontinued        3.375 g 100 mL/hr over 30 Minutes Intravenous Every 8 hours 07/22/21 0827 07/22/21 0831   07/22/21 0000  piperacillin-tazobactam (ZOSYN) IVPB 3.375 g        3.375 g 100 mL/hr over 30 Minutes Intravenous  Once 07/21/21 2352 07/22/21 0142        DVT prophylaxis: Heparin  Code  Status: Full code  Family Communication: No family at bedside   CONSULTS nephrology   Objective    Physical Examination:  General-appears in no acute distress Heart-S1-S2, regular, no murmur auscultated Lungs-clear to auscultation bilaterally, no wheezing or crackles auscultated  Abdomen-soft, nontender, no organomegaly Extremities-no edema in the lower extremities Neuro-alert, oriented x3, no focal deficit noted  Status is: Inpatient:          Oswald Hillock   [facility] If 7PM-7AM, please contact night-coverage at [email or web address], Office  [phone]   08/07/2021, 2:47 PM  LOS: 16 days

## 2021-08-07 NOTE — Progress Notes (Signed)
Hypoglycemic Event  CBG: 68  Treatment: Cereal and milk   Symptoms: None  Follow-up CBG: Time:0100  CBG Result:92   Possible Reasons for Event: Inadequate meal intake  Comments/MD notified:no    [name]

## 2021-08-07 NOTE — Progress Notes (Signed)
[facility] Progress Note   Subjective:  Patient seen and examined bedside. No complaints. Toelrated HD yesterday, net uf 2L. She reports that she is eager to do PT today Objective Vitals:   08/06/21 2212 08/07/21 0028 08/07/21 0430 08/07/21 0849  BP: (!) 103/59 100/66 133/74 112/66  Pulse: 90 92 92 (!) 116  Resp: 19 18 18 17   Temp: 97.8 F (36.6 C) 97.6 F (36.4 C) 97.8 F (36.6 C) 98.3 F (36.8 C)  TempSrc: Oral Oral Oral Oral  SpO2: 98% 91% 99% 100%  Weight:      Height:       Physical Exam General:  NAD, laying flat in bed Heart: RRR Lungs: cta b/l Abdomen: soft Extremities: no sig edema (improved) Dialysis Access:  RIJ [facility]  Additional Objective Labs: Basic Metabolic Panel: Recent Labs  Lab 08/02/21 0742 08/03/21 0458 08/04/21 1638 08/05/21 0212 08/06/21 0720  NA 139   < > 138 138 139  K 4.2   < > 3.0* 4.1 3.9  CL 98   < > 97* 101 102  CO2 23   < > 29 26 26   GLUCOSE 108*   < > 95 97 99  BUN 68*   < > 10 31* 39*  CREATININE 8.21*   < > 1.51* 4.59* 5.68*  CALCIUM 8.2*   < > 8.2* 8.0* 8.0*  PHOS 7.5*  --  1.3*  --  4.8*   < > = values in this interval not displayed.   Liver Function Tests: Recent Labs  Lab 08/04/21 0450 08/04/21 1638 08/06/21 0720  AST 17  --   --   ALT 16  --   --   ALKPHOS 42  --   --   BILITOT 0.8  --   --   PROT 5.2*  --   --   ALBUMIN 1.6* 2.0* 1.9*   No results for input(s): "LIPASE", "AMYLASE" in the last 168 hours.  CBC: Recent Labs  Lab 08/02/21 0742 08/03/21 0458 08/04/21 0450 08/05/21 0212 08/06/21 0720  WBC 10.8* 11.3* 8.5 7.0 7.9  HGB 7.8* 7.8* 7.0* 6.8* 9.0*  HCT 22.8* 22.8* 21.6* 20.1* 26.1*  MCV 93.4 93.4 96.0 94.4 90.9  PLT 332 321 272 225 199   Blood Culture    Component Value Date/Time   SDES BLOOD RIGHT ANTECUBITAL 07/21/2021 1915   SPECREQUEST  07/21/2021 1915    BOTTLES DRAWN AEROBIC ONLY Blood Culture results may not be optimal due to an inadequate volume of blood received in  culture bottles   CULT  07/21/2021 1915    NO GROWTH 5 DAYS Performed at [facility], [place] [address] [place], [place] [address]    REPTSTATUS 07/26/2021 FINAL 07/21/2021 1915    Cardiac Enzymes: No results for input(s): "CKTOTAL", "CKMB", "CKMBINDEX", "TROPONINI" in the last 168 hours.  CBG: Recent Labs  Lab 08/06/21 1622 08/07/21 0042 08/07/21 0122 08/07/21 0434 08/07/21 0846  GLUCAP 81 68* 92 76 72   Iron Studies:  No results for input(s): "IRON", "TIBC", "TRANSFERRIN", "FERRITIN" in the last 72 hours.  @lablastinr3 @ Studies/Results: No results found. Medications:  sodium chloride 10 mL/hr at 07/27/21 0654    sodium chloride   Intravenous Once   Chlorhexidine Gluconate Cloth  6 each Topical Daily   Chlorhexidine Gluconate Cloth  6 each Topical Q0600   darbepoetin (ARANESP) injection - DIALYSIS  100 mcg Intravenous Q Thu-HD   feeding supplement (NEPRO CARB STEADY)  237 mL Oral BID BM  heparin injection (subcutaneous)  5,000 Units Subcutaneous Q8H   metoprolol tartrate  25 mg Oral BID   multivitamin  1 tablet Oral QHS   neomycin-bacitracin-polymyxin   Topical TID   pantoprazole  40 mg Oral Daily   polycarbophil  625 mg Oral BID   sodium chloride flush  10-40 mL Intracatheter Q12H   sodium chloride flush  3 mL Intravenous Q12H    Assessment/Plan: 1.  Acute kidney injury chronic kidney disease stage IIIa: Appears consistent with ATN of septic shock.  Initiated HD 7/10 for worsening azotemia/metabolic acidosis. Hopefully will be temporary HD until she has renal recovery. Labs indicate will have ongoing dialysis needs.  Purewick hasn't been great at capturing urine volume.   IR placed RIJ temp HD catheter 7/14, exchanged 7/17 due to catheter malfntn-still didn't work. S/p [facility] placement 7/20 w/ IR -now that her catheter issue is sorted out and has been stable w/ HD, will plan for her next HD on Tuesday. Please check daily labs so we can monitor for recovery -CLIP  for outpatient HD placement (AKI). Hoping that she may recover relatively soon -Avoid nephrotoxic medications including NSAIDs and iodinated intravenous contrast exposure unless the latter is absolutely indicated.  Preferred narcotic agents for pain control are hydromorphone, fentanyl, and methadone. Morphine should not be used. Avoid Baclofen and avoid oral sodium phosphate and magnesium citrate based laxatives / bowel preps. Continue strict Input and Output monitoring. Will monitor the patient closely with you and intervene or adjust therapy as indicated by changes in clinical status/labs.  2.  Incarcerated ventral hernia: Status post exploratory laparotomy with small bowel resection, adhesiolysis and hernia repair with JP drain placement (removed 7/16).  Ongoing follow-up by surgery. 3. Hyperphosphatemia: secondary to AKI: started renvela, po4 down to 7.5 on 7/18, down to 1.3 7/20--stopped binders, PO4 4.8 on 7/22, watch for now 4.  Peritonitis: With resolution of septic shock, weaned off of pressors and remains on corticosteroids.  completed course of Zosyn. 5.  Anemia: Possibly chronic from malabsorption given prior history of small bowel resection.  Exacerbated by acute/critical illness and perioperative blood losses.  Iron sat 14, ferrritin 162; received total of 500mg  of ferrlecit, last dose 7/15.  Hb dropped to 6.8 7/21, received transfusion, hgb 9 on 7/22. W/u per primary. ESA start 7/20

## 2021-08-08 DIAGNOSIS — Z8719 Personal history of other diseases of the digestive system: Secondary | ICD-10-CM | POA: Diagnosis not present

## 2021-08-08 DIAGNOSIS — E860 Dehydration: Secondary | ICD-10-CM | POA: Diagnosis not present

## 2021-08-08 DIAGNOSIS — N179 Acute kidney failure, unspecified: Secondary | ICD-10-CM | POA: Diagnosis not present

## 2021-08-08 DIAGNOSIS — K436 Other and unspecified ventral hernia with obstruction, without gangrene: Secondary | ICD-10-CM | POA: Diagnosis not present

## 2021-08-08 LAB — RENAL FUNCTION PANEL
Albumin: 1.8 g/dL — ABNORMAL LOW (ref 3.5–5.0)
Anion gap: 11 (ref 5–15)
BUN: 27 mg/dL — ABNORMAL HIGH (ref 8–23)
CO2: 25 mmol/L (ref 22–32)
Calcium: 8.3 mg/dL — ABNORMAL LOW (ref 8.9–10.3)
Chloride: 103 mmol/L (ref 98–111)
Creatinine, Ser: 4.49 mg/dL — ABNORMAL HIGH (ref 0.44–1.00)
GFR, Estimated: 10 mL/min — ABNORMAL LOW (ref >60)
Glucose, Bld: 96 mg/dL (ref 70–99)
Phosphorus: 4.3 mg/dL (ref 2.5–4.6)
Potassium: 3.8 mmol/L (ref 3.5–5.1)
Sodium: 139 mmol/L (ref 135–145)

## 2021-08-08 LAB — GLUCOSE, CAPILLARY
Glucose-Capillary: 100 mg/dL — ABNORMAL HIGH (ref 70–99)
Glucose-Capillary: 101 mg/dL — ABNORMAL HIGH (ref 70–99)
Glucose-Capillary: 101 mg/dL — ABNORMAL HIGH (ref 70–99)
Glucose-Capillary: 103 mg/dL — ABNORMAL HIGH (ref 70–99)
Glucose-Capillary: 118 mg/dL — ABNORMAL HIGH (ref 70–99)
Glucose-Capillary: 157 mg/dL — ABNORMAL HIGH (ref 70–99)
Glucose-Capillary: 77 mg/dL (ref 70–99)

## 2021-08-08 MED ORDER — CHLORHEXIDINE GLUCONATE CLOTH 2 % EX PADS: 6.0000 | MEDICATED_PAD | Freq: Every day | CUTANEOUS | Status: DC

## 2021-08-08 NOTE — Progress Notes (Signed)
Contacted [facility] admissions and pt's clinic placement is still pending. Awaiting final clinic placement. Will provide update to CSW.   Melven Sartorius Renal Navigator [phone]

## 2021-08-08 NOTE — Progress Notes (Signed)
Occupational Therapy Treatment Patient Details Name: [name] MRN: [id number] DOB: [birth year] Today's Date: 08/08/2021   History of present illness Pt is a 66 y.o. female admitted 07/21/21 with worsening abdominal pain. S/p emergent ex lap, small bowel resection, adhesiolysis x90 min, incarcerated ventral hernia repair with JP drain placement on 7/7. ETT 7/7-7/11. Course complicated by AKI on CKD 3; HD initiated 7/10. NGT removed 7/13. Temp catheter not working in HD 7/14; to IR for exchange. PMH includes recurrent ventral hernia s/p hernia and small bowel repairs, HTN.   OT comments  Patient received in supine and asking to use BSC. Patient was min guard and increased time to get to EOB. Patient required verbal cues for hand placement for sit to stand and to reach back for [facility]. Patient stated she believed she could walk around bed to recliner from [facility] but was only able to go 4 feet before requiring seated rest. Patient assisted to recliner and led in UE HEP to increase strength and activity tolerance for increased safety and performance with functional transfer. Acute OT to continue to follow.    Recommendations for follow up therapy are one component of a multi-disciplinary discharge planning process, led by the attending physician.  Recommendations may be updated based on patient status, additional functional criteria and insurance authorization.    Follow Up Recommendations  Skilled nursing-short term rehab (<3 hours/day)    Assistance Recommended at Discharge Frequent or constant Supervision/Assistance  Patient can return home with the following  Help with stairs or ramp for entrance;Assist for transportation;Assistance with cooking/housework;Direct supervision/assist for medications management;A lot of help with bathing/dressing/bathroom;A lot of help with walking and/or transfers   Equipment Recommendations  BSC/3in1;Tub/shower seat;Wheelchair (measurements OT);Wheelchair cushion  (measurements OT)    Recommendations for Other Services      Precautions / Restrictions Precautions Precautions: Fall;Other (comment) Precaution Comments: watch HR Restrictions Weight Bearing Restrictions: No       Mobility Bed Mobility Overal bed mobility: Needs Assistance Bed Mobility: Supine to Sit     Supine to sit: Min guard     General bed mobility comments: min guard and increased time    Transfers Overall transfer level: Needs assistance Equipment used: Rolling walker (2 wheels) Transfers: Sit to/from Stand, Bed to chair/wheelchair/BSC Sit to Stand: Min assist     Step pivot transfers: Min assist     General transfer comment: min assist to rise and steady with cues for safety     Balance Overall balance assessment: Needs assistance Sitting-balance support: No upper extremity supported, Single extremity supported Sitting balance-Leahy Scale: Good   Postural control: Posterior lean Standing balance support: Reliant on assistive device for balance Standing balance-Leahy Scale: Poor Standing balance comment: reliant on RW for standing balance                           ADL either performed or assessed with clinical judgement   ADL Overall ADL's : Needs assistance/impaired     Grooming: Wash/dry hands;Wash/dry face;Set up;Sitting Grooming Details (indicate cue type and reason): in chair                 Toilet Transfer: Minimal assistance;BSC/3in1 Toilet Transfer Details (indicate cue type and reason): transferred from EOB to [facility] with RW and verbal cues for safety and hand placement Toileting- Clothing Manipulation and Hygiene: Moderate assistance Toileting - Clothing Manipulation Details (indicate cue type and reason): toilet hygiene       General ADL  Comments: rquired increased time with toilet transfer due to fatigue and complaints of light headiness    Extremity/Trunk Assessment Upper Extremity Assessment RUE Sensation:  WNL RUE Coordination: WNL LUE Sensation: WNL LUE Coordination: WNL            Vision       Perception     Praxis      Cognition Arousal/Alertness: Awake/alert Behavior During Therapy: Flat affect Overall Cognitive Status: Within Functional Limits for tasks assessed                           Safety/Judgement: Decreased awareness of deficits Awareness: Emergent Problem Solving: Slow processing General Comments: impaired safety and awareness of deficits        Exercises Exercises: General Upper Extremity General Exercises - Upper Extremity Shoulder ABduction: Strengthening, Both, 10 reps, Standing, Theraband Theraband Level (Shoulder Abduction): Level 1 (Yellow) Elbow Flexion: Strengthening, Both, 10 reps, Seated, Theraband Theraband Level (Elbow Flexion): Level 1 (Yellow) Elbow Extension: Strengthening, Both, 10 reps, Seated, Theraband Theraband Level (Elbow Extension): Level 1 (Yellow)    Shoulder Instructions       General Comments      Pertinent Vitals/ Pain       Pain Assessment Pain Assessment: Faces Faces Pain Scale: Hurts a little bit Pain Location: abdomen Pain Descriptors / Indicators: Discomfort Pain Intervention(s): Monitored during session, Repositioned  Home Living                                          Prior Functioning/Environment              Frequency  Min 2X/week        Progress Toward Goals  OT Goals(current goals can now be found in the care plan section)  Progress towards OT goals: Progressing toward goals  Acute Rehab OT Goals Patient Stated Goal: get better OT Goal Formulation: With patient Time For Goal Achievement: 08/12/21 Potential to Achieve Goals: Fair ADL Goals Pt Will Perform Grooming: with supervision;sitting Pt Will Perform Upper Body Bathing: with min assist;sitting Pt Will Perform Upper Body Dressing: with min assist;sitting Pt Will Transfer to Toilet: with mod  assist;stand pivot transfer;bedside commode Pt/caregiver will Perform Home Exercise Program: Increased ROM;Increased strength;Both right and left upper extremity;With Supervision  Plan Discharge plan remains appropriate    Co-evaluation                 AM-PAC OT "6 Clicks" Daily Activity     Outcome Measure   Help from another person eating meals?: None Help from another person taking care of personal grooming?: None Help from another person toileting, which includes using toliet, bedpan, or urinal?: A Lot Help from another person bathing (including washing, rinsing, drying)?: A Lot Help from another person to put on and taking off regular upper body clothing?: A Little Help from another person to put on and taking off regular lower body clothing?: A Lot 6 Click Score: 17    End of Session Equipment Utilized During Treatment: Rolling walker (2 wheels)  OT Visit Diagnosis: Unsteadiness on feet (R26.81);Muscle weakness (generalized) (M62.81)   Activity Tolerance Patient limited by fatigue   Patient Left in chair;with call bell/phone within reach;with family/visitor present;with chair alarm set   Nurse Communication Mobility status        Time: 6269-4854 OT Time Calculation (min): 36  min  Charges: OT General Charges $OT Visit: 1 Visit OT Treatments $Self Care/Home Management : 8-22 mins $Therapeutic Exercise: 8-22 mins  Lodema Hong, OTA Acute Rehabilitation Services  Office [place] 08/08/2021, 12:16 PM

## 2021-08-08 NOTE — TOC Progression Note (Signed)
Transition of Care [facility]) - Progression Note    Patient Details  Name: [name] MRN: [id number] Date of Birth: [birth year]  Transition of Care [facility]) CM/SW [place], [place] Phone Number: 08/08/2021, 10:24 AM  Clinical Narrative:     CSW spoke with patient's sister Stanton Kidney. She explained [facility] does not provide transportation to dialysis. Stanton Kidney could not for certain assure family is able to pick up patient from SNF 3x per week and transport to dialysis because they work. She states it was depending on the days and  time she had dialysis. CSW explained time and days would be depending on availability of HD clinic.  CSW recommended family select another facility that provided transportation that could accommodate the patient needs, family agreed. Preferred SNF is [facility].   CSW spoke with [facility] - they will review and determine if they can meet patient's dialysis needs. CSW awaiting their decision.    Expected Discharge Plan: [place] Barriers to Discharge: Continued Medical Work up  Expected Discharge Plan and Services Expected Discharge Plan: [place] arrangements for the past 2 months: Single Family Home                                       Social Determinants of Health (SDOH) Interventions    Readmission Risk Interventions     No data to display

## 2021-08-08 NOTE — Progress Notes (Signed)
Physical Therapy Treatment Patient Details Name: [name] MRN: [id number] DOB: 06-17-1955 Today's Date: 08/08/2021   History of Present Illness Pt is a 66 y.o. female admitted 07/21/21 with worsening abdominal pain. S/p emergent ex lap, small bowel resection, adhesiolysis x90 min, incarcerated ventral hernia repair with JP drain placement on 7/7. ETT 7/7-7/11. Course complicated by AKI on CKD 3; HD initiated 7/10. NGT removed 7/13. Temp catheter not working in HD 7/14; to IR for exchange. PMH includes recurrent ventral hernia s/p hernia and small bowel repairs, HTN.    PT Comments    Pt supine in bed on arrival this session.  Pt required encouragement to participate in PT session.  Pt performed sit to stand to obtained orthostatic vitals as she complained of dizziness this session.  Pt returned back to bed to perform LE exercises.  Pt fatigues and required AAROM to complete exercises.  Continue to recommend rehab in a post acute setting to improve strength and function.    BP supine-116/65 BP sitting-109/67 BP standing-116/83    Recommendations for follow up therapy are one component of a multi-disciplinary discharge planning process, led by the attending physician.  Recommendations may be updated based on patient status, additional functional criteria and insurance authorization.  Follow Up Recommendations  Skilled nursing-short term rehab (<3 hours/day) (pt declined.)     Assistance Recommended at Discharge Frequent or constant Supervision/Assistance  Patient can return home with the following A lot of help with walking and/or transfers;A lot of help with bathing/dressing/bathroom;Assistance with cooking/housework;Assist for transportation;Help with stairs or ramp for entrance   Equipment Recommendations  Rolling walker (2 wheels);BSC/3in1;Wheelchair (measurements PT);Wheelchair cushion (measurements PT)    Recommendations for Other Services       Precautions / Restrictions  Precautions Precautions: Fall;Other (comment) Precaution Comments: watch HR Restrictions Weight Bearing Restrictions: No     Mobility  Bed Mobility Overal bed mobility: Needs Assistance Bed Mobility: Supine to Sit, Sit to Supine Rolling: Min assist Sidelying to sit: Min assist   Sit to supine: Mod assist   General bed mobility comments: Assistance for trunk elevation to move to edge of bed.  Mod assistance to lift B LEs against gravity back to bed.  Reports mild dizziness in sitting.    Transfers Overall transfer level: Needs assistance Equipment used: Rolling walker (2 wheels) Transfers: Sit to/from Stand Sit to Stand: Min assist           General transfer comment: Min assistance with cues for hand placement to and from seated surface.  Able to stand x 1 min this session.    Ambulation/Gait Ambulation/Gait assistance:  (Pt refused ambulation this session.)                 Stairs             Wheelchair Mobility    Modified Rankin (Stroke Patients Only)       Balance Overall balance assessment: Needs assistance Sitting-balance support: No upper extremity supported, Single extremity supported Sitting balance-Leahy Scale: Good       Standing balance-Leahy Scale: Poor                              Cognition Arousal/Alertness: Awake/alert Behavior During Therapy: Flat affect Overall Cognitive Status: Within Functional Limits for tasks assessed Area of Impairment: Attention, Following commands, Safety/judgement, Awareness, Problem solving  Current Attention Level: Sustained, Selective   Following Commands: Follows one step commands with increased time Safety/Judgement: Decreased awareness of deficits Awareness: Emergent Problem Solving: Slow processing General Comments: impaired safety and awareness of deficits        Exercises General Exercises - Lower Extremity Ankle Circles/Pumps: AROM, Both, 10  reps, Supine Quad Sets: AROM, Both, 10 reps, Supine Heel Slides: AROM, AAROM, Both, 10 reps Hip ABduction/ADduction: AROM, AAROM, Both, 10 reps, Supine    General Comments        Pertinent Vitals/Pain Pain Assessment Pain Assessment: Faces Faces Pain Scale: Hurts a little bit Pain Location: abdomen Pain Descriptors / Indicators: Discomfort Pain Intervention(s): Monitored during session, Repositioned    Home Living                          Prior Function            PT Goals (current goals can now be found in the care plan section) Acute Rehab PT Goals Patient Stated Goal: return home Potential to Achieve Goals: Good Progress towards PT goals: Progressing toward goals    Frequency    Min 3X/week      PT Plan Current plan remains appropriate    Co-evaluation              AM-PAC PT "6 Clicks" Mobility   Outcome Measure  Help needed turning from your back to your side while in a flat bed without using bedrails?: A Lot Help needed moving from lying on your back to sitting on the side of a flat bed without using bedrails?: A Lot Help needed moving to and from a bed to a chair (including a wheelchair)?: A Lot Help needed standing up from a chair using your arms (e.g., wheelchair or bedside chair)?: A Lot Help needed to walk in hospital room?: A Little Help needed climbing 3-5 steps with a railing? : Total 6 Click Score: 12    End of Session Equipment Utilized During Treatment: Gait belt Activity Tolerance: Patient limited by fatigue;Patient tolerated treatment well Patient left: with call bell/phone within reach;in bed;with bed alarm set Nurse Communication: Mobility status PT Visit Diagnosis: Other abnormalities of gait and mobility (R26.89);Muscle weakness (generalized) (M62.81);Difficulty in walking, not elsewhere classified (R26.2);Pain Pain - Right/Left:  (Bilateral) Pain - part of body: Leg (abdomen)     Time: 4332-9518 PT Time  Calculation (min) (ACUTE ONLY): 17 min  Charges:  $Therapeutic Activity: 8-22 mins                     Erasmo Leventhal , PTA Acute Rehabilitation Services Office [phone]    Karia Ehresman Eli Hose 08/08/2021, 12:53 PM

## 2021-08-08 NOTE — Progress Notes (Addendum)
[facility]  PROGRESS NOTE  [name] SVX:[id number] DOB: 24-Jul-1955 DOA: 07/21/2021 PCP: Mancel Bale, PA-C   Brief HPI:   67 year old female with history of hypertension, CKD stage III a,hyperlipidemia, chronically incarcerated large abdominal wall hernia which was repaired multiple times, recent SBO resection with mesh placement who presented to the ER with severe abdominal pain, vomiting.  L CT abdomen/pelvis showed large SBO, large lower abdominal ventral hernia containing much of the small bowel.  General surgery consulted and she was taken to the OR for emergent exploratory laparotomy and hernia repair,she was transferred to ICU.  Patient was hemodynamically stable and patient transferred to [facility] service on 7/13.  On dialysis for persistent AKI, nephrology following.  General surgery were following.  PT recommending skilled nursing facility on discharge   Subjective   Denies any complaints   Assessment/Plan:    Incarcerated/strangulated ventral hernia:  -Status post small bowel resection, lysis of additions, repair of hernia and JP drain placement.   -JP and was removed on 07/31/2021 -Also found to have peritonitis.  Completed antibiotics course.    General surgery has signed off -Patient on soft diet    AKI on CKD stage 3A -Likely from ATN from septic shock secondary to peritonitis.   -Initiated hemodialysis on 7/10, nephrology following.  IR exchanged temporary dialysis catheter on the right IJ.   -IR was consulted to change to tunneled HD catheter -Patient will likely need hemodialysis as outpatient   Normocytic anemia:  -Likely associated with critical illness, chronic kidney disease, postop blood loss Low iron as per labs,given iv iron.   -Hemoglobin was down to 6.8 -S/p 1 unit PRBC -Hemoglobin has improved to 9.7   Intermittent sinus tachycardia - Unclear etiology.  Echo showed EF of more than 75%, no regional wall motion abnormality, grade 1 diastolic  dysfunction, no valvular abnormality.   -Continue  metoprolol at 25 mg bid.   -TSH  5.359   History of hypertension -Blood pressure is soft -On amlodipine at home, currently on hold -Continue metoprolol as above   Acute hypoxic respiratory failure - Had to be put on ventilator after surgery, currently extubated and on room air   Obesity - BMI of 37.1    Debility/deconditioning - Patient seen by PT and recommended skilled nursing facility on discharge.TOC following     Medications     sodium chloride   Intravenous Once   Chlorhexidine Gluconate Cloth  6 each Topical Daily   Chlorhexidine Gluconate Cloth  6 each Topical Q0600   darbepoetin (ARANESP) injection - DIALYSIS  100 mcg Intravenous Q Thu-HD   feeding supplement (NEPRO CARB STEADY)  237 mL Oral BID BM   heparin injection (subcutaneous)  5,000 Units Subcutaneous Q8H   metoprolol tartrate  25 mg Oral BID   multivitamin  1 tablet Oral QHS   neomycin-bacitracin-polymyxin   Topical TID   pantoprazole  40 mg Oral Daily   polycarbophil  625 mg Oral BID   sodium chloride flush  10-40 mL Intracatheter Q12H   sodium chloride flush  3 mL Intravenous Q12H     Data Reviewed:   CBG:  Recent Labs  Lab 08/07/21 1953 08/08/21 0022 08/08/21 0425 08/08/21 0803 08/08/21 1208  GLUCAP 112* 118* 101* 77 157*    SpO2: 94 % O2 Flow Rate (L/min): 2 L/min FiO2 (%): 40 %    Vitals:   08/08/21 0019 08/08/21 0419 08/08/21 0811 08/08/21 1204  BP: 106/62 100/60 106/62 (!) 107/57  Pulse: 97  88 96 87  Resp: 17 18 18 19   Temp: 98.7 F (37.1 C)  98.3 F (36.8 C) 98 F (36.7 C)  TempSrc: Oral  Oral Oral  SpO2: 98% 100% 99% 94%  Weight:      Height:          Data Reviewed:  Basic Metabolic Panel: Recent Labs  Lab 08/02/21 0742 08/03/21 0458 08/04/21 0450 08/04/21 1638 08/05/21 0212 08/06/21 0720 08/08/21 0455  NA 139   < > 140 138 138 139 139  K 4.2   < > 4.0 3.0* 4.1 3.9 3.8  CL 98   < > 108 97* 101 102 103   CO2 23   < > 23 29 26 26 25   GLUCOSE 108*   < > 104* 95 97 99 96  BUN 68*   < > 69* 10 31* 39* 27*  CREATININE 8.21*   < > 8.76* 1.51* 4.59* 5.68* 4.49*  CALCIUM 8.2*   < > 8.0* 8.2* 8.0* 8.0* 8.3*  PHOS 7.5*  --   --  1.3*  --  4.8* 4.3   < > = values in this interval not displayed.    CBC: Recent Labs  Lab 08/03/21 0458 08/04/21 0450 08/05/21 0212 08/06/21 0720 08/07/21 1030  WBC 11.3* 8.5 7.0 7.9 9.7  HGB 7.8* 7.0* 6.8* 9.0* 9.7*  HCT 22.8* 21.6* 20.1* 26.1* 28.3*  MCV 93.4 96.0 94.4 90.9 90.4  PLT 321 272 225 199 123*    LFT Recent Labs  Lab 08/02/21 0742 08/04/21 0450 08/04/21 1638 08/06/21 0720 08/08/21 0455  AST  --  17  --   --   --   ALT  --  16  --   --   --   ALKPHOS  --  42  --   --   --   BILITOT  --  0.8  --   --   --   PROT  --  5.2*  --   --   --   ALBUMIN 1.7* 1.6* 2.0* 1.9* 1.8*     Antibiotics: Anti-infectives (From admission, onward)    Start     Dose/Rate Route Frequency Ordered Stop   08/04/21 0845  ceFAZolin (ANCEF) IVPB 2g/100 mL premix        over 30 Minutes Intravenous Continuous PRN 08/04/21 0850 08/04/21 0845   08/04/21 0841  ceFAZolin (ANCEF) 2-4 GM/100ML-% IVPB       Note to Pharmacy: Peggyann Shoals: cabinet override      08/04/21 0841 08/04/21 0850   07/23/21 1730  piperacillin-tazobactam (ZOSYN) IVPB 2.25 g        2.25 g 100 mL/hr over 30 Minutes Intravenous Every 8 hours 07/23/21 1207 07/28/21 1019   07/22/21 0900  piperacillin-tazobactam (ZOSYN) IVPB 2.25 g  Status:  Discontinued        2.25 g 100 mL/hr over 30 Minutes Intravenous Every 8 hours 07/22/21 0833 07/23/21 1207   07/22/21 0830  piperacillin-tazobactam (ZOSYN) IVPB 3.375 g  Status:  Discontinued        3.375 g 100 mL/hr over 30 Minutes Intravenous Every 8 hours 07/22/21 0827 07/22/21 0831   07/22/21 0000  piperacillin-tazobactam (ZOSYN) IVPB 3.375 g        3.375 g 100 mL/hr over 30 Minutes Intravenous  Once 07/21/21 2352 07/22/21 0142        DVT  prophylaxis: Heparin  Code Status: Full code  Family Communication: No family at bedside   CONSULTS nephrology  Objective    Physical Examination:  General-appears in no acute distress Heart-S1-S2, regular, no murmur auscultated Lungs-clear to auscultation bilaterally, no wheezing or crackles auscultated Abdomen-soft, nontender, no organomegaly Extremities-no edema in the lower extremities Neuro-alert, oriented x3, no focal deficit noted  Status is: Inpatient:          Oswald Hillock   [facility] If 7PM-7AM, please contact night-coverage at [email or web address], Office  [phone]   08/08/2021, 2:47 PM  LOS: 17 days

## 2021-08-08 NOTE — Progress Notes (Addendum)
Abdominal surgical incision assessed, foam dressing removed from site and copious brown purulent discharge noted. Steri-strips no longer adhered and incision appears to have some dehiscence. Strong foul odor present. CCS on call number paged and made aware.  Orders received to clean and pack wound with kerlix and night time physician will assess.  Clyde Canterbury, RN

## 2021-08-08 NOTE — Progress Notes (Signed)
[facility] Progress Note   Subjective:  Patient seen and examined bedside. No complaints. BP soft on low dose metoprolol Objective Vitals:   08/07/21 1929 08/08/21 0019 08/08/21 0419 08/08/21 0811  BP: 110/67 106/62 100/60 106/62  Pulse: 100 97 88 96  Resp: 19 17 18 18   Temp: 98.7 F (37.1 C) 98.7 F (37.1 C)  98.3 F (36.8 C)  TempSrc: Oral Oral  Oral  SpO2: 98% 98% 100% 99%  Weight:      Height:       Physical Exam General:  NAD, laying flat in bed Heart: RRR Lungs: cta b/l Abdomen: soft Extremities: no sig edema (improved) Dialysis Access:  [facility]  Additional Objective Labs: Basic Metabolic Panel: Recent Labs  Lab 08/04/21 1638 08/05/21 0212 08/06/21 0720 08/08/21 0455  NA 138 138 139 139  K 3.0* 4.1 3.9 3.8  CL 97* 101 102 103  CO2 29 26 26 25   GLUCOSE 95 97 99 96  BUN 10 31* 39* 27*  CREATININE 1.51* 4.59* 5.68* 4.49*  CALCIUM 8.2* 8.0* 8.0* 8.3*  PHOS 1.3*  --  4.8* 4.3   Liver Function Tests: Recent Labs  Lab 08/04/21 0450 08/04/21 1638 08/06/21 0720 08/08/21 0455  AST 17  --   --   --   ALT 16  --   --   --   ALKPHOS 42  --   --   --   BILITOT 0.8  --   --   --   PROT 5.2*  --   --   --   ALBUMIN 1.6* 2.0* 1.9* 1.8*   No results for input(s): "LIPASE", "AMYLASE" in the last 168 hours.  CBC: Recent Labs  Lab 08/03/21 0458 08/04/21 0450 08/05/21 0212 08/06/21 0720 08/07/21 1030  WBC 11.3* 8.5 7.0 7.9 9.7  HGB 7.8* 7.0* 6.8* 9.0* 9.7*  HCT 22.8* 21.6* 20.1* 26.1* 28.3*  MCV 93.4 96.0 94.4 90.9 90.4  PLT 321 272 225 199 123*   Blood Culture    Component Value Date/Time   SDES BLOOD RIGHT ANTECUBITAL 07/21/2021 1915   SPECREQUEST  07/21/2021 1915    BOTTLES DRAWN AEROBIC ONLY Blood Culture results may not be optimal due to an inadequate volume of blood received in culture bottles   CULT  07/21/2021 1915    NO GROWTH 5 DAYS Performed at [facility] [address] [place], [place] [address]     REPTSTATUS 07/26/2021 FINAL 07/21/2021 1915    Cardiac Enzymes: No results for input(s): "CKTOTAL", "CKMB", "CKMBINDEX", "TROPONINI" in the last 168 hours.  CBG: Recent Labs  Lab 08/07/21 1625 08/07/21 1953 08/08/21 0022 08/08/21 0425 08/08/21 0803  GLUCAP 84 112* 118* 101* 77   Iron Studies:  No results for input(s): "IRON", "TIBC", "TRANSFERRIN", "FERRITIN" in the last 72 hours.  @lablastinr3 @ Studies/Results: No results found. Medications:  sodium chloride 10 mL/hr at 07/27/21 0654    sodium chloride   Intravenous Once   Chlorhexidine Gluconate Cloth  6 each Topical Daily   Chlorhexidine Gluconate Cloth  6 each Topical Q0600   darbepoetin (ARANESP) injection - DIALYSIS  100 mcg Intravenous Q Thu-HD   feeding supplement (NEPRO CARB STEADY)  237 mL Oral BID BM   heparin injection (subcutaneous)  5,000 Units Subcutaneous Q8H   metoprolol tartrate  25 mg Oral BID   multivitamin  1 tablet Oral QHS   neomycin-bacitracin-polymyxin   Topical TID   pantoprazole  40 mg Oral Daily   polycarbophil  625 mg Oral BID   sodium chloride flush  10-40 mL Intracatheter Q12H   sodium chloride flush  3 mL Intravenous Q12H    Assessment/Plan: 1.  Acute kidney injury chronic kidney disease stage IIIa: Appears consistent with ATN of septic shock.  Initiated HD 7/10 for worsening azotemia/metabolic acidosis. Hopefully will be temporary HD until she has renal recovery. Labs indicate ongoing dialysis needs.  Purewick hasn't been great at capturing urine volume.   IR placed RIJ temp HD catheter 7/14, exchanged 7/17 due to catheter malfntn-still didn't work. S/p [facility] placement 7/20 w/ IR -now that her catheter issue is sorted out and has been stable w/ HD, will tentatively plan for her next HD tomorrow ( got on Saturday) checking daily labs in AM so we can monitor for recovery -CLIP for outpatient HD placement (AKI). Hoping that she may recover relatively soon 2.  Incarcerated ventral hernia: Status  post exploratory laparotomy with small bowel resection, adhesiolysis and hernia repair with JP drain placement (removed 7/16).  Ongoing follow-up by surgery. 3. Hyperphosphatemia: secondary to AKI: started renvela, po4 down to 7.5 on 7/18, down to 1.3 7/20--stopped binders, PO4 4.8 on 7/22, watch for now 4.  Peritonitis: With resolution of septic shock, weaned off of pressors and remains on corticosteroids.  completed course of Zosyn. 5.  Anemia: Possibly chronic from malabsorption given prior history of small bowel resection.  Exacerbated by acute/critical illness and perioperative blood losses.  Iron sat 14, ferrritin 162; received total of 500mg  of ferrlecit, last dose 7/15.  Hb dropped to 6.8 7/21, received transfusion, hgb 9 on 7/22. W/u per primary. ESA start 7/20  Louis Meckel

## 2021-08-09 DIAGNOSIS — Z8719 Personal history of other diseases of the digestive system: Secondary | ICD-10-CM | POA: Diagnosis not present

## 2021-08-09 DIAGNOSIS — K436 Other and unspecified ventral hernia with obstruction, without gangrene: Secondary | ICD-10-CM | POA: Diagnosis not present

## 2021-08-09 DIAGNOSIS — E860 Dehydration: Secondary | ICD-10-CM | POA: Diagnosis not present

## 2021-08-09 DIAGNOSIS — N179 Acute kidney failure, unspecified: Secondary | ICD-10-CM | POA: Diagnosis not present

## 2021-08-09 LAB — CBC
HCT: 25.8 % — ABNORMAL LOW (ref 36.0–46.0)
Hemoglobin: 8.4 g/dL — ABNORMAL LOW (ref 12.0–15.0)
MCH: 30.8 pg (ref 26.0–34.0)
MCHC: 32.6 g/dL (ref 30.0–36.0)
MCV: 94.5 fL (ref 80.0–100.0)
Platelets: 159 10*3/uL (ref 150–400)
RBC: 2.73 MIL/uL — ABNORMAL LOW (ref 3.87–5.11)
RDW: 18.5 % — ABNORMAL HIGH (ref 11.5–15.5)
WBC: 6.9 10*3/uL (ref 4.0–10.5)
nRBC: 0 % (ref 0.0–0.2)

## 2021-08-09 LAB — RENAL FUNCTION PANEL
Albumin: 1.8 g/dL — ABNORMAL LOW (ref 3.5–5.0)
Albumin: 1.8 g/dL — ABNORMAL LOW (ref 3.5–5.0)
Anion gap: 12 (ref 5–15)
Anion gap: 13 (ref 5–15)
BUN: 34 mg/dL — ABNORMAL HIGH (ref 8–23)
BUN: 39 mg/dL — ABNORMAL HIGH (ref 8–23)
CO2: 23 mmol/L (ref 22–32)
CO2: 25 mmol/L (ref 22–32)
Calcium: 8.2 mg/dL — ABNORMAL LOW (ref 8.9–10.3)
Calcium: 8.4 mg/dL — ABNORMAL LOW (ref 8.9–10.3)
Chloride: 103 mmol/L (ref 98–111)
Chloride: 103 mmol/L (ref 98–111)
Creatinine, Ser: 4.89 mg/dL — ABNORMAL HIGH (ref 0.44–1.00)
Creatinine, Ser: 4.87 mg/dL — ABNORMAL HIGH (ref 0.44–1.00)
GFR, Estimated: 9 mL/min — ABNORMAL LOW (ref >60)
GFR, Estimated: 9 mL/min — ABNORMAL LOW (ref >60)
Glucose, Bld: 97 mg/dL (ref 70–99)
Glucose, Bld: 89 mg/dL (ref 70–99)
Phosphorus: 4.7 mg/dL — ABNORMAL HIGH (ref 2.5–4.6)
Phosphorus: 4.7 mg/dL — ABNORMAL HIGH (ref 2.5–4.6)
Potassium: 3.5 mmol/L (ref 3.5–5.1)
Potassium: 3.8 mmol/L (ref 3.5–5.1)
Sodium: 138 mmol/L (ref 135–145)
Sodium: 141 mmol/L (ref 135–145)

## 2021-08-09 LAB — GLUCOSE, CAPILLARY
Glucose-Capillary: 113 mg/dL — ABNORMAL HIGH (ref 70–99)
Glucose-Capillary: 69 mg/dL — ABNORMAL LOW (ref 70–99)
Glucose-Capillary: 96 mg/dL (ref 70–99)

## 2021-08-09 MED ORDER — ALTEPLASE 2 MG IJ SOLR: 4.0000 mg | Freq: Once | INTRAMUSCULAR | Status: AC

## 2021-08-09 MED ORDER — ALTEPLASE 2 MG IJ SOLR
4.0000 mg | Freq: Once | INTRAMUSCULAR | Status: AC
  Administered 2021-08-09: 4 mg

## 2021-08-09 MED ORDER — ALTEPLASE 2 MG IJ SOLR
INTRAMUSCULAR | Status: AC
  Administered 2021-08-09: 4 mg
  Filled 2021-08-09: qty 4

## 2021-08-09 NOTE — Progress Notes (Addendum)
Pt has been accepted at [facility] on MWF 12:35 chair time. Pt can start as soon as tomorrow if needed. Pt will need to arrive at 11:30 for first appt to complete paperwork prior to treatment. Pt also needs to bring insurance card to first appt. Contacted pt's sister, Stanton Kidney, via phone and call got disconnected and was unable to reach [place] again. Unable to provide out-pt arrangements to sister this afternoon. Update provided to attending, nephrologist, and [facility] staff via secure chat. Pt is for snf placement and facility has not been located as of yet. Update provided to clinic that pt will not be starting tomorrow. Will assist as needed.   Melven Sartorius Renal Navigator [phone]  Addendum at 3:30 pm: Received a return call from pt's sister, Stanton Kidney. Discussed pt's out-pt HD arrangements. Will f/u with pt/family once d/c date is known to confirm start date at clinic.

## 2021-08-09 NOTE — Progress Notes (Signed)
[facility]  PROGRESS NOTE  [name] NIO:[id number] DOB: March 20, 1955 DOA: 07/21/2021 PCP: Mancel Bale, PA-C   Brief HPI:   66 year old female with history of hypertension, CKD stage III a,hyperlipidemia, chronically incarcerated large abdominal wall hernia which was repaired multiple times, recent SBO resection with mesh placement who presented to the ER with severe abdominal pain, vomiting.  L CT abdomen/pelvis showed large SBO, large lower abdominal ventral hernia containing much of the small bowel.  General surgery consulted and she was taken to the OR for emergent exploratory laparotomy and hernia repair,she was transferred to ICU.  Patient was hemodynamically stable and patient transferred to [facility] service on 7/13.  On dialysis for persistent AKI, nephrology following.  General surgery were following.  PT recommending skilled nursing facility on discharge -IR placed right IJ temp HD catheter on 714, which was changed to right IJ [facility] catheter on 08/04/2021   Subjective   Patient had brown-colored discharge from the midline postoperative wound.  General surgery saw and recommended dressing changes.  No new complaints today.   Assessment/Plan:    Incarcerated/strangulated ventral hernia:  -Status post small bowel resection, lysis of additions, repair of hernia and JP drain placement.   -JP and was removed on 07/31/2021 -Also found to have peritonitis.  Completed antibiotics course.    General surgery has signed off -Continue wound care/dressing changes as prescribed. -Patient on soft diet    AKI on CKD stage 3A -Likely from ATN from septic shock secondary to peritonitis.   -Initiated hemodialysis on 7/10, nephrology following.  IR exchanged temporary dialysis catheter on the right IJ.   -IR was consulted to change to tunneled HD catheter -Patient will likely need hemodialysis as outpatient   Normocytic anemia:  -Likely associated with critical illness, chronic kidney  disease, postop blood loss Low iron as per labs,given iv iron.   -Hemoglobin was down to 6.8 -S/p 1 unit PRBC -Hemoglobin has improved to 9.7   Intermittent sinus tachycardia - Unclear etiology.  Echo showed EF of more than 75%, no regional wall motion abnormality, grade 1 diastolic dysfunction, no valvular abnormality.   -Continue  metoprolol at 25 mg bid.   -TSH  5.359   History of hypertension -Blood pressure is soft -On amlodipine at home, currently on hold -Continue metoprolol as above   Acute hypoxic respiratory failure - Had to be put on ventilator after surgery, currently extubated and on room air   Obesity - BMI of 37.1    Debility/deconditioning - Patient seen by PT and recommended skilled nursing facility on discharge.TOC following     Medications     alteplase       alteplase  4 mg Intracatheter Once   Chlorhexidine Gluconate Cloth  6 each Topical Daily   darbepoetin (ARANESP) injection - DIALYSIS  100 mcg Intravenous Q Thu-HD   feeding supplement (NEPRO CARB STEADY)  237 mL Oral BID BM   heparin injection (subcutaneous)  5,000 Units Subcutaneous Q8H   metoprolol tartrate  25 mg Oral BID   multivitamin  1 tablet Oral QHS   neomycin-bacitracin-polymyxin   Topical TID   pantoprazole  40 mg Oral Daily   polycarbophil  625 mg Oral BID   sodium chloride flush  10-40 mL Intracatheter Q12H   sodium chloride flush  3 mL Intravenous Q12H     Data Reviewed:   CBG:  Recent Labs  Lab 08/08/21 1208 08/08/21 1615 08/08/21 1658 08/08/21 2007 08/09/21 1227  GLUCAP 157* 103* 100* 101*  113*    SpO2: 100 % O2 Flow Rate (L/min): 2 L/min FiO2 (%): 40 %    Vitals:   08/09/21 0807 08/09/21 1223 08/09/21 1434 08/09/21 1448  BP: 101/68 111/70 118/67 115/66  Pulse: 88 83 81 80  Resp: 17 15 13 13   Temp: 97.9 F (36.6 C) 97.9 F (36.6 C) 97.7 F (36.5 C)   TempSrc: Oral Oral    SpO2: 96%  99% 100%  Height:          Data Reviewed:  Basic Metabolic  Panel: Recent Labs  Lab 08/04/21 1638 08/05/21 0212 08/06/21 0720 08/08/21 0455 08/09/21 0131 08/09/21 1428  NA 138 138 139 139 138 141  K 3.0* 4.1 3.9 3.8 3.5 3.8  CL 97* 101 102 103 103 103  CO2 29 26 26 25 23 25   GLUCOSE 95 97 99 96 97 89  BUN 10 31* 39* 27* 34* 39*  CREATININE 1.51* 4.59* 5.68* 4.49* 4.89* 4.87*  CALCIUM 8.2* 8.0* 8.0* 8.3* 8.2* 8.4*  PHOS 1.3*  --  4.8* 4.3 4.7* 4.7*    CBC: Recent Labs  Lab 08/04/21 0450 08/05/21 0212 08/06/21 0720 08/07/21 1030 08/09/21 1428  WBC 8.5 7.0 7.9 9.7 6.9  HGB 7.0* 6.8* 9.0* 9.7* 8.4*  HCT 21.6* 20.1* 26.1* 28.3* 25.8*  MCV 96.0 94.4 90.9 90.4 94.5  PLT 272 225 199 123* 159    LFT Recent Labs  Lab 08/04/21 0450 08/04/21 1638 08/06/21 0720 08/08/21 0455 08/09/21 0131 08/09/21 1428  AST 17  --   --   --   --   --   ALT 16  --   --   --   --   --   ALKPHOS 42  --   --   --   --   --   BILITOT 0.8  --   --   --   --   --   PROT 5.2*  --   --   --   --   --   ALBUMIN 1.6* 2.0* 1.9* 1.8* 1.8* 1.8*     Antibiotics: Anti-infectives (From admission, onward)    Start     Dose/Rate Route Frequency Ordered Stop   08/04/21 0845  ceFAZolin (ANCEF) IVPB 2g/100 mL premix        over 30 Minutes Intravenous Continuous PRN 08/04/21 0850 08/04/21 0845   08/04/21 0841  ceFAZolin (ANCEF) 2-4 GM/100ML-% IVPB       Note to Pharmacy: Peggyann Shoals: cabinet override      08/04/21 0841 08/04/21 0850   07/23/21 1730  piperacillin-tazobactam (ZOSYN) IVPB 2.25 g        2.25 g 100 mL/hr over 30 Minutes Intravenous Every 8 hours 07/23/21 1207 07/28/21 1019   07/22/21 0900  piperacillin-tazobactam (ZOSYN) IVPB 2.25 g  Status:  Discontinued        2.25 g 100 mL/hr over 30 Minutes Intravenous Every 8 hours 07/22/21 0833 07/23/21 1207   07/22/21 0830  piperacillin-tazobactam (ZOSYN) IVPB 3.375 g  Status:  Discontinued        3.375 g 100 mL/hr over 30 Minutes Intravenous Every 8 hours 07/22/21 0827 07/22/21 0831   07/22/21  0000  piperacillin-tazobactam (ZOSYN) IVPB 3.375 g        3.375 g 100 mL/hr over 30 Minutes Intravenous  Once 07/21/21 2352 07/22/21 0142        DVT prophylaxis: Heparin  Code Status: Full code  Family Communication: No family at bedside   CONSULTS nephrology  Objective    Physical Examination:  General-appears in no acute distress Heart-S1-S2, regular, no murmur auscultated Lungs-clear to auscultation bilaterally, no wheezing or crackles auscultated Abdomen-soft, nontender, no organomegaly Extremities-no edema in the lower extremities Neuro-alert, oriented x3, no focal deficit noted  Status is: Inpatient: Awaiting bed at skilled nursing facility        [place] If 7PM-7AM, please contact night-coverage at [email or web address], Office  [phone]   08/09/2021, 3:48 PM  LOS: 18 days

## 2021-08-09 NOTE — Progress Notes (Signed)
[facility] Progress Note   Subjective:  Patient seen and examined bedside. No complaints. BP still soft on low dose metoprolol-  202 ccs of UOP  Objective Vitals:   08/08/21 2005 08/09/21 0105 08/09/21 0353 08/09/21 0807  BP: 107/66 (!) 106/59 (!) 104/59 101/68  Pulse: 95 84 84 88  Resp: 20 17 18 17   Temp: 98.2 F (36.8 C) 97.8 F (36.6 C) 98.1 F (36.7 C) 97.9 F (36.6 C)  TempSrc: Oral Oral Oral Oral  SpO2: 100% 100% 96% 96%  Weight:      Height:       Physical Exam General:  NAD, laying flat in bed Heart: RRR Lungs: cta b/l Abdomen: soft Extremities: no sig edema  Dialysis Access:  [facility]  Additional Objective Labs: Basic Metabolic Panel: Recent Labs  Lab 08/06/21 0720 08/08/21 0455 08/09/21 0131  NA 139 139 138  K 3.9 3.8 3.5  CL 102 103 103  CO2 26 25 23   GLUCOSE 99 96 97  BUN 39* 27* 34*  CREATININE 5.68* 4.49* 4.89*  CALCIUM 8.0* 8.3* 8.2*  PHOS 4.8* 4.3 4.7*   Liver Function Tests: Recent Labs  Lab 08/04/21 0450 08/04/21 1638 08/06/21 0720 08/08/21 0455 08/09/21 0131  AST 17  --   --   --   --   ALT 16  --   --   --   --   ALKPHOS 42  --   --   --   --   BILITOT 0.8  --   --   --   --   PROT 5.2*  --   --   --   --   ALBUMIN 1.6*   < > 1.9* 1.8* 1.8*   < > = values in this interval not displayed.   No results for input(s): "LIPASE", "AMYLASE" in the last 168 hours.  CBC: Recent Labs  Lab 08/03/21 0458 08/04/21 0450 08/05/21 0212 08/06/21 0720 08/07/21 1030  WBC 11.3* 8.5 7.0 7.9 9.7  HGB 7.8* 7.0* 6.8* 9.0* 9.7*  HCT 22.8* 21.6* 20.1* 26.1* 28.3*  MCV 93.4 96.0 94.4 90.9 90.4  PLT 321 272 225 199 123*   Blood Culture    Component Value Date/Time   SDES BLOOD RIGHT ANTECUBITAL 07/21/2021 1915   SPECREQUEST  07/21/2021 1915    BOTTLES DRAWN AEROBIC ONLY Blood Culture results may not be optimal due to an inadequate volume of blood received in culture bottles   CULT  07/21/2021 1915    NO GROWTH 5  DAYS Performed at [facility] [address] [place], [place] [address]    REPTSTATUS 07/26/2021 FINAL 07/21/2021 1915    Cardiac Enzymes: No results for input(s): "CKTOTAL", "CKMB", "CKMBINDEX", "TROPONINI" in the last 168 hours.  CBG: Recent Labs  Lab 08/08/21 0803 08/08/21 1208 08/08/21 1615 08/08/21 1658 08/08/21 2007  GLUCAP 77 157* 103* 100* 101*   Iron Studies:  No results for input(s): "IRON", "TIBC", "TRANSFERRIN", "FERRITIN" in the last 72 hours.  @lablastinr3 @ Studies/Results: No results found. Medications:  sodium chloride 10 mL/hr at 07/27/21 0654    Chlorhexidine Gluconate Cloth  6 each Topical Daily   darbepoetin (ARANESP) injection - DIALYSIS  100 mcg Intravenous Q Thu-HD   feeding supplement (NEPRO CARB STEADY)  237 mL Oral BID BM   heparin injection (subcutaneous)  5,000 Units Subcutaneous Q8H   metoprolol tartrate  25 mg Oral BID   multivitamin  1 tablet Oral QHS   neomycin-bacitracin-polymyxin   Topical  TID   pantoprazole  40 mg Oral Daily   polycarbophil  625 mg Oral BID   sodium chloride flush  10-40 mL Intracatheter Q12H   sodium chloride flush  3 mL Intravenous Q12H    Assessment/Plan: 1.  Acute kidney injury chronic kidney disease stage IIIa: Appears consistent with ATN of septic shock.  Initiated HD 7/10 for worsening azotemia/metabolic acidosis. Hopefully will be temporary HD until she has renal recovery. Labs indicate ongoing dialysis needs.  Purewick hasn't been great at capturing urine volume.   IR placed RIJ temp HD catheter 7/14, then [facility] placement 7/20 w/ IR - have been doing HD on a TTS schedule but checking labs to look for renal recovery-  BUN and crt up from yesterday so will do HD today  -CLIP for outpatient HD placement (AKI).  BUT, hoping that she may recover relatively soon 2.  Incarcerated ventral hernia: Status post exploratory laparotomy with small bowel resection, adhesiolysis and hernia repair with JP drain placement  (removed 7/16).  Ongoing follow-up by surgery. 3. Hyperphosphatemia: secondary to AKI: started renvela, po4 down to 7.5 on 7/18, down to 1.3 7/20--stopped binders, PO4 4.7 on 7/25, watch for now  4.   Anemia: Possibly chronic from malabsorption given prior history of small bowel resection.  Exacerbated by acute/critical illness and perioperative blood losses.  Iron sat 14, ferrritin 162; received total of 500mg  of ferrlecit, last dose 7/15.  Hb dropped to 6.8 7/21, received transfusion, hgb 9 on 7/22, then 9.7 on 7/23. W/u per primary. ESA start 7/20  Louis Meckel

## 2021-08-09 NOTE — Progress Notes (Signed)
18 Days Post-Op  Subjective: Asked to resee patient as abdominal midline wound started to drain brown type fluid yesterday after staple removal.  Patient states she is otherwise doing ok and tolerating her diet, etc.  Objective: Vital signs in last 24 hours: Temp:  [97.8 F (36.6 C)-98.3 F (36.8 C)] 98.1 F (36.7 C) (07/25 0353) Pulse Rate:  [84-96] 84 (07/25 0353) Resp:  [17-20] 18 (07/25 0353) BP: (104-119)/(57-66) 104/59 (07/25 0353) SpO2:  [94 %-100 %] 96 % (07/25 0353) Last BM Date : 08/08/21  Intake/Output from previous day: 07/24 0701 - 07/25 0700 In: 120 [P.O.:120] Out: 202 [Urine:202] Intake/Output this shift: No intake/output data recorded.  PE: Gen:  Alert, NAD, pleasant Abd: Soft, NT on my exam. +BS.  Incision is open and dark serosang fluid is draining from midline wound c/w old seroma from hernia sac.  Her fascia is intact and fluid does not appear purulent or infected.  Dressing/packing was placed in the wound.  Lab Results:  Recent Labs    08/07/21 1030  WBC 9.7  HGB 9.7*  HCT 28.3*  PLT 123*   BMET Recent Labs    08/08/21 0455 08/09/21 0131  NA 139 138  K 3.8 3.5  CL 103 103  CO2 25 23  GLUCOSE 96 97  BUN 27* 34*  CREATININE 4.49* 4.89*  CALCIUM 8.3* 8.2*   PT/INR No results for input(s): "LABPROT", "INR" in the last 72 hours. CMP     Component Value Date/Time   NA 138 08/09/2021 0131   NA 146 (H) 07/11/2017 1547   K 3.5 08/09/2021 0131   CL 103 08/09/2021 0131   CO2 23 08/09/2021 0131   GLUCOSE 97 08/09/2021 0131   BUN 34 (H) 08/09/2021 0131   BUN 14 07/11/2017 1547   CREATININE 4.89 (H) 08/09/2021 0131   CREATININE 1.04 05/06/2014 1538   CALCIUM 8.2 (L) 08/09/2021 0131   PROT 5.2 (L) 08/04/2021 0450   PROT 6.1 07/11/2017 1547   ALBUMIN 1.8 (L) 08/09/2021 0131   ALBUMIN 3.5 (L) 07/11/2017 1547   AST 17 08/04/2021 0450   ALT 16 08/04/2021 0450   ALKPHOS 42 08/04/2021 0450   BILITOT 0.8 08/04/2021 0450   BILITOT <0.2  07/11/2017 1547   GFRNONAA 9 (L) 08/09/2021 0131   GFRAA 50 (L) 09/13/2019 0755   Lipase     Component Value Date/Time   LIPASE 24 07/21/2021 2056    Studies/Results: No results found.  Anti-infectives: Anti-infectives (From admission, onward)    Start     Dose/Rate Route Frequency Ordered Stop   08/04/21 0845  ceFAZolin (ANCEF) IVPB 2g/100 mL premix        over 30 Minutes Intravenous Continuous PRN 08/04/21 0850 08/04/21 0845   08/04/21 0841  ceFAZolin (ANCEF) 2-4 GM/100ML-% IVPB       Note to Pharmacy: Peggyann Shoals: cabinet override      08/04/21 0841 08/04/21 0850   07/23/21 1730  piperacillin-tazobactam (ZOSYN) IVPB 2.25 g        2.25 g 100 mL/hr over 30 Minutes Intravenous Every 8 hours 07/23/21 1207 07/28/21 1019   07/22/21 0900  piperacillin-tazobactam (ZOSYN) IVPB 2.25 g  Status:  Discontinued        2.25 g 100 mL/hr over 30 Minutes Intravenous Every 8 hours 07/22/21 0833 07/23/21 1207   07/22/21 0830  piperacillin-tazobactam (ZOSYN) IVPB 3.375 g  Status:  Discontinued        3.375 g 100 mL/hr over 30 Minutes Intravenous  Every 8 hours 07/22/21 0827 07/22/21 0831   07/22/21 0000  piperacillin-tazobactam (ZOSYN) IVPB 3.375 g        3.375 g 100 mL/hr over 30 Minutes Intravenous  Once 07/21/21 2352 07/22/21 0142        Assessment/Plan POD#18 s/p  exploratory laparotomy, small bowel resection, adhesiolysis x90 minutes, primary repair of incarcerated ventral hernia, JP drain placement 7/7 Dr. Bobbye Morton for incarcerated and strangulated ventral hernia - Drain removed at bedside 7/16 - Soft diet, bid fiber - Completed 5 days post-op abx, WBC normal - Encourage mobilization  - WOC following regarding her skin excoriation etc. Please follow their wound care recommendations and monitor.  - Wound has unfortunately separated, but fascia is still intact.  Fluid drainage is old seroma type fluid from hernia sac.  This is evacuated.  Will start NS WD dressing changes BID to  midline wound. -surgically stable when medically stable for DC, will follow to assure wound stability for now.  ID - zosyn 7/7>7/12 FEN - soft diet VTE - SQH Foley - not present   - below per TRH -   Septic shock - off pressors 7/9 ARF - HD started 7/10, temp HD cath exchange 7/17. Nephrology following.  VDRF - extubated 7/11 Elevated troponin - cards has seen, suspect secondary to critical illness and not ACS HTN Anemia - hgb stable    LOS: 18 days    Henreitta Cea , [facility] 08/09/2021, 7:56 AM Please see Amion for pager number during day hours 7:00am-4:30pm

## 2021-08-09 NOTE — Progress Notes (Signed)
Physical Therapy Treatment Patient Details Name: [name] MRN: [id number] DOB: [birth year] Today's Date: 08/09/2021   History of Present Illness Pt is a 66 y.o. female admitted 07/21/21 with worsening abdominal pain. S/p emergent ex lap, small bowel resection, adhesiolysis x90 min, incarcerated ventral hernia repair with JP drain placement on 7/7. ETT 7/7-7/11. Course complicated by AKI on CKD 3; HD initiated 7/10. NGT removed 7/13. Temp catheter not working in HD 7/14; to IR for exchange. PMH includes recurrent ventral hernia s/p hernia and small bowel repairs, HTN.    PT Comments    Pt received supine and agreeable to session stating goal to walk to bathroom. Pt min guard to come to sitting EOB with increased time and no physical assist needed. Pt needing min guard to ambulate to BR, no standing rest needed, and back to EOB with seated recovery between. Pt with fair tolerance for LE therex for increased ROM and strength. Pt continues to be limited by fatigue and BLE weakness. Current plan remains appropriate to address deficits and maximize functional independence and decrease caregiver burden. Pt continues to benefit from skilled PT services to progress toward functional mobility goals.     Recommendations for follow up therapy are one component of a multi-disciplinary discharge planning process, led by the attending physician.  Recommendations may be updated based on patient status, additional functional criteria and insurance authorization.  Follow Up Recommendations  Skilled nursing-short term rehab (<3 hours/day Can patient physically be transported by private vehicle:  (TBD)   Assistance Recommended at Discharge Frequent or constant Supervision/Assistance  Patient can return home with the following A lot of help with walking and/or transfers;A lot of help with bathing/dressing/bathroom;Assistance with cooking/housework;Assist for transportation;Help with stairs or ramp for entrance    Equipment Recommendations  Rolling walker (2 wheels);BSC/3in1;Wheelchair (measurements PT);Wheelchair cushion (measurements PT)    Recommendations for Other Services       Precautions / Restrictions Precautions Precautions: Fall;Other (comment) Precaution Comments: watch HR Restrictions Weight Bearing Restrictions: No     Mobility  Bed Mobility Overal bed mobility: Needs Assistance Bed Mobility: Supine to Sit, Sit to Supine     Supine to sit: Min guard Sit to supine: Mod assist   General bed mobility comments: asssit to bring BLE into bed, no assist needed to come to sitting EOB    Transfers Overall transfer level: Needs assistance Equipment used: Rolling walker (2 wheels) Transfers: Sit to/from Stand Sit to Stand: Min assist           General transfer comment: Min assistance with cues for hand placement, from EOB and BSC, able to static stand with BUE on RW for peri care in BR    Ambulation/Gait Ambulation/Gait assistance: Min assist Gait Distance (Feet): 12 Feet (+10') Assistive device: Rolling walker (2 wheels) Gait Pattern/deviations: Step-through pattern, Decreased stride length, Antalgic Gait velocity: decr     General Gait Details: slow antaglic gait in room,no LOB noted, distance limited by fatigue, 12' to BR, 10' back to EOB (moved bed closer as pt fatigued)   Stairs             Wheelchair Mobility    Modified Rankin (Stroke Patients Only)       Balance Overall balance assessment: Needs assistance Sitting-balance support: No upper extremity supported, Single extremity supported Sitting balance-Leahy Scale: Good Sitting balance - Comments: reliant on UE assist or external support   Standing balance support: Reliant on assistive device for balance Standing balance-Leahy Scale: Poor Standing balance comment:  reliant on RW for standing balance                            Cognition Arousal/Alertness: Awake/alert Behavior  During Therapy: Flat affect Overall Cognitive Status: Within Functional Limits for tasks assessed Area of Impairment: Attention, Following commands, Safety/judgement, Awareness, Problem solving                   Current Attention Level: Sustained, Selective   Following Commands: Follows one step commands with increased time Safety/Judgement: Decreased awareness of deficits Awareness: Emergent Problem Solving: Slow processing General Comments: impaired safety and awareness of deficits        Exercises General Exercises - Lower Extremity Long Arc Quad: AROM, Right, Left, 10 reps, Seated Hip ABduction/ADduction: AROM, Both, 10 reps, Supine Hip Flexion/Marching: AROM, Right, Left, 20 reps, Seated    General Comments General comments (skin integrity, edema, etc.): VSS on RA      Pertinent Vitals/Pain Pain Assessment Pain Assessment: Faces Faces Pain Scale: Hurts a little bit Pain Location: abdomen Pain Descriptors / Indicators: Discomfort Pain Intervention(s): Monitored during session, Limited activity within patient's tolerance    Home Living                          Prior Function            PT Goals (current goals can now be found in the care plan section) Acute Rehab PT Goals Patient Stated Goal: walk to bathroom PT Goal Formulation: With patient Time For Goal Achievement: 08/10/21    Frequency    Min 3X/week      PT Plan Current plan remains appropriate    Co-evaluation              AM-PAC PT "6 Clicks" Mobility   Outcome Measure  Help needed turning from your back to your side while in a flat bed without using bedrails?: A Lot Help needed moving from lying on your back to sitting on the side of a flat bed without using bedrails?: A Lot Help needed moving to and from a bed to a chair (including a wheelchair)?: A Lot Help needed standing up from a chair using your arms (e.g., wheelchair or bedside chair)?: A Little Help needed to  walk in hospital room?: A Little Help needed climbing 3-5 steps with a railing? : Total 6 Click Score: 13    End of Session   Activity Tolerance: Patient limited by fatigue;Patient tolerated treatment well Patient left: with call bell/phone within reach;in bed Nurse Communication: Mobility status PT Visit Diagnosis: Other abnormalities of gait and mobility (R26.89);Muscle weakness (generalized) (M62.81);Difficulty in walking, not elsewhere classified (R26.2);Pain Pain - Right/Left:  (Bilateral) Pain - part of body: Leg (abdomen)     Time: 7654-6503 PT Time Calculation (min) (ACUTE ONLY): 26 min  Charges:  $Gait Training: 8-22 mins $Therapeutic Exercise: 8-22 mins                     Tiki Tucciarone R. PTA Acute Rehabilitation Services Office: [place] 08/09/2021, 12:31 PM

## 2021-08-09 NOTE — TOC Progression Note (Signed)
Transition of Care [facility]) - Progression Note    Patient Details  Name: [name] MRN: [id number] Date of Birth: [birth year]  Transition of Care [facility]) CM/SW [place], [place] Phone Number: 08/09/2021, 1:17 PM  Clinical Narrative:     CSW met with patient at bedside to provide update. CSW informed [facility] was unable to offer. Patient inquired about [facility]. CSW spoke with admission/Michelle and she states they have reached their max and is unable to accommodate dialysis needs. CSW asked the patient to reconsider other offers. [facility] and [place], although SNFs will still need to confirmed bed offers.   [facility] is reviewing  [employer]  Expected Discharge Plan: [place] Barriers to Discharge: Continued Medical Work up  Expected Discharge Plan and Services Expected Discharge Plan: [place] arrangements for the past 2 months: Single Family Home                                       Social Determinants of Health (SDOH) Interventions    Readmission Risk Interventions     No data to display

## 2021-08-09 NOTE — Progress Notes (Signed)
Hemodialysis- Catheter not functioning. AP high >320 despite flushing/reversing/repositioning. Cath flo instilled and dwelled. Attempted to restart but still with same issues. Unable to advance bfr >200. Dr. Moshe Cipro notified. Order to cathflo and dwell overnight. Will notify primary RN

## 2021-08-10 DIAGNOSIS — E43 Unspecified severe protein-calorie malnutrition: Secondary | ICD-10-CM

## 2021-08-10 DIAGNOSIS — K430 Incisional hernia with obstruction, without gangrene: Secondary | ICD-10-CM

## 2021-08-10 DIAGNOSIS — K436 Other and unspecified ventral hernia with obstruction, without gangrene: Secondary | ICD-10-CM | POA: Diagnosis not present

## 2021-08-10 DIAGNOSIS — Z9911 Dependence on respirator [ventilator] status: Secondary | ICD-10-CM

## 2021-08-10 DIAGNOSIS — Z6841 Body Mass Index (BMI) 40.0 and over, adult: Secondary | ICD-10-CM | POA: Diagnosis not present

## 2021-08-10 DIAGNOSIS — N179 Acute kidney failure, unspecified: Secondary | ICD-10-CM | POA: Diagnosis not present

## 2021-08-10 DIAGNOSIS — I10 Essential (primary) hypertension: Secondary | ICD-10-CM | POA: Diagnosis not present

## 2021-08-10 DIAGNOSIS — Z9889 Other specified postprocedural states: Secondary | ICD-10-CM

## 2021-08-10 LAB — RENAL FUNCTION PANEL
Albumin: 1.9 g/dL — ABNORMAL LOW (ref 3.5–5.0)
Anion gap: 12 (ref 5–15)
BUN: 33 mg/dL — ABNORMAL HIGH (ref 8–23)
CO2: 22 mmol/L (ref 22–32)
Calcium: 8.2 mg/dL — ABNORMAL LOW (ref 8.9–10.3)
Chloride: 105 mmol/L (ref 98–111)
Creatinine, Ser: 4.25 mg/dL — ABNORMAL HIGH (ref 0.44–1.00)
GFR, Estimated: 11 mL/min — ABNORMAL LOW (ref >60)
Glucose, Bld: 88 mg/dL (ref 70–99)
Phosphorus: 4.4 mg/dL (ref 2.5–4.6)
Potassium: 3.7 mmol/L (ref 3.5–5.1)
Sodium: 139 mmol/L (ref 135–145)

## 2021-08-10 LAB — GLUCOSE, CAPILLARY
Glucose-Capillary: 86 mg/dL (ref 70–99)
Glucose-Capillary: 107 mg/dL — ABNORMAL HIGH (ref 70–99)
Glucose-Capillary: 132 mg/dL — ABNORMAL HIGH (ref 70–99)
Glucose-Capillary: 114 mg/dL — ABNORMAL HIGH (ref 70–99)

## 2021-08-10 NOTE — Progress Notes (Signed)
Occupational Therapy Treatment Patient Details Name: [name] MRN: [id number] DOB: Feb 05, 1955 Today's Date: 08/10/2021   History of present illness 66 y.o. female admitted 07/21/21 with abdominal pain due to incarcerated ventral hernia and SBO. S/p emergent ex lap, small bowel resection, incarcerated ventral hernia repair with JP drain placement on 7/7. ETT 7/7-7/11. Course complicated by AKI on CKD 3; HD initiated 7/10. 7/20 Rt IJ TDC placed. PMHx:chronic incarcerated hernia s/p multiple repairs, SBO, HTN, HLD, CKD   OT comments  Patient received in supine and agreeable to OT treatment. Patient able to get to EOB with supervision and ambulated to bathroom with min assist with RW. Patient was min assist to complete toilet hygiene and patient able to ambulate back to EOB without seated rest break. Patient performed hand hygiene seated. Patient given HEP for UE strengthening and instructed in exercises while supine with HOB raised. Acute OT to continue to follow.    Recommendations for follow up therapy are one component of a multi-disciplinary discharge planning process, led by the attending physician.  Recommendations may be updated based on patient status, additional functional criteria and insurance authorization.    Follow Up Recommendations  Skilled nursing-short term rehab (<3 hours/day)    Assistance Recommended at Discharge Frequent or constant Supervision/Assistance  Patient can return home with the following  Help with stairs or ramp for entrance;Assist for transportation;Assistance with cooking/housework;Direct supervision/assist for medications management;A lot of help with bathing/dressing/bathroom;A lot of help with walking and/or transfers   Equipment Recommendations  BSC/3in1;Tub/shower seat;Wheelchair (measurements OT);Wheelchair cushion (measurements OT)    Recommendations for Other Services      Precautions / Restrictions Precautions Precautions: Fall;Other  (comment) Precaution Comments: watch HR       Mobility Bed Mobility Overal bed mobility: Needs Assistance Bed Mobility: Supine to Sit, Sit to Supine     Supine to sit: Supervision Sit to supine: Min assist   General bed mobility comments: assistance with BLE to return to supine    Transfers Overall transfer level: Needs assistance Equipment used: Rolling walker (2 wheels) Transfers: Sit to/from Stand Sit to Stand: Min assist           General transfer comment: ambulated to bathoom for toilet transfer to [facility] over toilet and back to EOB     Balance Overall balance assessment: Needs assistance Sitting-balance support: No upper extremity supported Sitting balance-Leahy Scale: Good Sitting balance - Comments: able to sit on EOB and perform hand hygiene   Standing balance support: Reliant on assistive device for balance, Bilateral upper extremity supported Standing balance-Leahy Scale: Poor Standing balance comment: reliant on RW for standing balance                           ADL either performed or assessed with clinical judgement   ADL Overall ADL's : Needs assistance/impaired     Grooming: Wash/dry hands;Wash/dry face;Set up;Sitting Grooming Details (indicate cue type and reason): on EOB                 Toilet Transfer: Minimal assistance;Ambulation;Regular Toilet;BSC/3in1 Armed forces technical officer Details (indicate cue type and reason): ambulated to bathroom to toilet with BSC over toilet to increase height Toileting- Clothing Manipulation and Hygiene: Minimal assistance Toileting - Clothing Manipulation Details (indicate cue type and reason): toilet hygiene       General ADL Comments: too fatigued from walker from bathroom to stand at sink for hand hygiene    Extremity/Trunk Assessment  Vision       Perception     Praxis      Cognition Arousal/Alertness: Awake/alert Behavior During Therapy: Flat affect Overall Cognitive  Status: Within Functional Limits for tasks assessed                                 General Comments: more alert than previous visit        Exercises Exercises: General Lower Extremity General Exercises - Upper Extremity Shoulder Flexion: Strengthening, Both, 10 reps, Supine, Theraband Theraband Level (Shoulder Flexion): Level 1 (Yellow) Shoulder ABduction: Strengthening, Both, 15 reps, Supine, Theraband Theraband Level (Shoulder Abduction): Level 1 (Yellow) Elbow Flexion: Strengthening, Both, 10 reps, Supine, Theraband Theraband Level (Elbow Flexion): Level 1 (Yellow) Elbow Extension: Strengthening, Both, 10 reps, Supine, Theraband Theraband Level (Elbow Extension): Level 1 (Yellow)    Shoulder Instructions       General Comments      Pertinent Vitals/ Pain       Pain Assessment Pain Assessment: Faces Faces Pain Scale: Hurts a little bit Pain Location: general Pain Descriptors / Indicators: Grimacing Pain Intervention(s): Limited activity within patient's tolerance, Monitored during session, Repositioned  Home Living                                          Prior Functioning/Environment              Frequency  Min 2X/week        Progress Toward Goals  OT Goals(current goals can now be found in the care plan section)  Progress towards OT goals: Progressing toward goals  Acute Rehab OT Goals Patient Stated Goal: get better OT Goal Formulation: With patient Time For Goal Achievement: 08/12/21 Potential to Achieve Goals: Fair ADL Goals Pt Will Perform Grooming: with supervision;sitting Pt Will Perform Upper Body Bathing: with min assist;sitting Pt Will Perform Upper Body Dressing: with min assist;sitting Pt Will Transfer to Toilet: with mod assist;stand pivot transfer;bedside commode Pt/caregiver will Perform Home Exercise Program: Increased ROM;Increased strength;Both right and left upper extremity;With Supervision  Plan  Discharge plan remains appropriate    Co-evaluation                 AM-PAC OT "6 Clicks" Daily Activity     Outcome Measure   Help from another person eating meals?: None Help from another person taking care of personal grooming?: None Help from another person toileting, which includes using toliet, bedpan, or urinal?: A Lot Help from another person bathing (including washing, rinsing, drying)?: A Lot Help from another person to put on and taking off regular upper body clothing?: A Little Help from another person to put on and taking off regular lower body clothing?: A Lot 6 Click Score: 17    End of Session Equipment Utilized During Treatment: Rolling walker (2 wheels)  OT Visit Diagnosis: Unsteadiness on feet (R26.81);Muscle weakness (generalized) (M62.81)   Activity Tolerance Patient tolerated treatment well   Patient Left in bed;with call bell/phone within reach;with family/visitor present   Nurse Communication Mobility status        Time: 4259-5638 OT Time Calculation (min): 27 min  Charges: OT General Charges $OT Visit: 1 Visit OT Treatments $Self Care/Home Management : 8-22 mins $Therapeutic Exercise: 8-22 mins  Lodema Hong, [place]  Office [phone]   Xoe Hoe  L Harith Mccadden 08/10/2021, 1:24 PM

## 2021-08-10 NOTE — Progress Notes (Signed)
Physical Therapy Treatment Patient Details Name: [name] MRN: [id number] DOB: 05/02/1955 Today's Date: 08/10/2021   History of Present Illness 66 y.o. female admitted 07/21/21 with abdominal pain due to incarcerated ventral hernia and SBO. S/p emergent ex lap, small bowel resection, incarcerated ventral hernia repair with JP drain placement on 7/7. ETT 7/7-7/11. Course complicated by AKI on CKD 3; HD initiated 7/10. 7/20 Rt IJ TDC placed. PMHx:chronic incarcerated hernia s/p multiple repairs, SBO, HTN, HLD, CKD    PT Comments    Pt pleasant and very willing to participate. Pt reports no abdominal pain only pain in hands from sticks. Pt with slowly increasing gait tolerance but limited by tachycardia with each gait trial. Max HR 153 with seated rest to recover between trials. Pt with RLE weaker than LLE requiring assist with HEP and educated for progression as well as encouraged to be OOB daily with staff. Plan remains appropriate with goals updated.     Recommendations for follow up therapy are one component of a multi-disciplinary discharge planning process, led by the attending physician.  Recommendations may be updated based on patient status, additional functional criteria and insurance authorization.  Follow Up Recommendations  Skilled nursing-short term rehab (<3 hours/day) Can patient physically be transported by private vehicle: Yes   Assistance Recommended at Discharge Frequent or constant Supervision/Assistance  Patient can return home with the following A little help with walking and/or transfers;A little help with bathing/dressing/bathroom;Assistance with cooking/housework;Assist for transportation;Direct supervision/assist for medications management;Help with stairs or ramp for entrance   Equipment Recommendations  Rollator (4 wheels);BSC/3in1    Recommendations for Other Services       Precautions / Restrictions Precautions Precautions: Fall;Other  (comment) Precaution Comments: watch HR     Mobility  Bed Mobility   Bed Mobility: Supine to Sit     Supine to sit: Supervision     General bed mobility comments: bed flat with use of rail, increased time to transition to sitting without physical assist    Transfers Overall transfer level: Needs assistance   Transfers: Sit to/from Stand Sit to Stand: Min assist           General transfer comment: cues for hand placement with assist to rise from bed and from recliner x 2 trials    Ambulation/Gait Ambulation/Gait assistance: Min guard Gait Distance (Feet): 32 Feet Assistive device: Rolling walker (2 wheels)     Gait velocity interpretation: <1.8 ft/sec, indicate of risk for recurrent falls   General Gait Details: pt walked 24', 32', 16' respectively with close chair follow and max HR 153 with seated rest between trials and HR back to 113-118 at rest.   Stairs             Wheelchair Mobility    Modified Rankin (Stroke Patients Only)       Balance Overall balance assessment: Needs assistance   Sitting balance-Leahy Scale: Good Sitting balance - Comments: static sitting with and without UE support   Standing balance support: Reliant on assistive device for balance, Bilateral upper extremity supported Standing balance-Leahy Scale: Poor Standing balance comment: reliant on RW for standing balance                            Cognition Arousal/Alertness: Awake/alert Behavior During Therapy: Flat affect Overall Cognitive Status: Within Functional Limits for tasks assessed  Exercises General Exercises - Lower Extremity Long Arc Quad: AROM, AAROM, Right, Seated, Left, 15 reps (AAROM on RLE) Hip Flexion/Marching: AROM, Right, Left, Seated, 15 reps, AAROM (AAROM on RLE)    General Comments        Pertinent Vitals/Pain Pain Assessment Pain Assessment: 0-10 Pain Score: 3  Pain  Location: bil hands from blood sticks Pain Descriptors / Indicators: Aching, Sore Pain Intervention(s): Limited activity within patient's tolerance    Home Living                          Prior Function            PT Goals (current goals can now be found in the care plan section) Acute Rehab PT Goals Time For Goal Achievement: 08/24/21 Potential to Achieve Goals: Fair Progress towards PT goals: Progressing toward goals    Frequency    Min 3X/week      PT Plan Current plan remains appropriate    Co-evaluation              AM-PAC PT "6 Clicks" Mobility   Outcome Measure  Help needed turning from your back to your side while in a flat bed without using bedrails?: A Little Help needed moving from lying on your back to sitting on the side of a flat bed without using bedrails?: A Little Help needed moving to and from a bed to a chair (including a wheelchair)?: A Little Help needed standing up from a chair using your arms (e.g., wheelchair or bedside chair)?: A Little Help needed to walk in hospital room?: A Lot Help needed climbing 3-5 steps with a railing? : Total 6 Click Score: 15    End of Session Equipment Utilized During Treatment: Gait belt Activity Tolerance: Patient tolerated treatment well Patient left: in chair;with call bell/phone within reach;with chair alarm set Nurse Communication: Mobility status PT Visit Diagnosis: Other abnormalities of gait and mobility (R26.89);Muscle weakness (generalized) (M62.81);Difficulty in walking, not elsewhere classified (R26.2);Pain     Time: 3704-8889 PT Time Calculation (min) (ACUTE ONLY): 35 min  Charges:  $Gait Training: 23-37 mins                     Bayard Males, PT Acute Rehabilitation Services Office: [place] 08/10/2021, 10:45 AM

## 2021-08-10 NOTE — Progress Notes (Signed)
[facility] Progress Note   Subjective:  Were really not able to use HD cath yesterday so did not get treatment-  only 200 of UOP recorded BUT interestingly  crt actually decreased from 4.8 to 4.2 and BUN dec some as well  Objective Vitals:   08/09/21 1448 08/09/21 1900 08/09/21 2304 08/10/21 0350  BP: 115/66 122/63 111/69 132/65  Pulse: 80 95 82 93  Resp: 13 15 17 14   Temp:  98.1 F (36.7 C) 98.4 F (36.9 C) 98.2 F (36.8 C)  TempSrc:  Oral Oral Oral  SpO2: 100%  98% 97%  Height:       Physical Exam General:  NAD, laying flat in bed Heart: RRR Lungs: cta b/l Abdomen: soft Extremities: no sig edema -  legs are obese Dialysis Access:  [facility]  Additional Objective Labs: Basic Metabolic Panel: Recent Labs  Lab 08/09/21 0131 08/09/21 1428 08/10/21 0137  NA 138 141 139  K 3.5 3.8 3.7  CL 103 103 105  CO2 23 25 22   GLUCOSE 97 89 88  BUN 34* 39* 33*  CREATININE 4.89* 4.87* 4.25*  CALCIUM 8.2* 8.4* 8.2*  PHOS 4.7* 4.7* 4.4   Liver Function Tests: Recent Labs  Lab 08/04/21 0450 08/04/21 1638 08/09/21 0131 08/09/21 1428 08/10/21 0137  AST 17  --   --   --   --   ALT 16  --   --   --   --   ALKPHOS 42  --   --   --   --   BILITOT 0.8  --   --   --   --   PROT 5.2*  --   --   --   --   ALBUMIN 1.6*   < > 1.8* 1.8* 1.9*   < > = values in this interval not displayed.   No results for input(s): "LIPASE", "AMYLASE" in the last 168 hours.  CBC: Recent Labs  Lab 08/04/21 0450 08/05/21 0212 08/06/21 0720 08/07/21 1030 08/09/21 1428  WBC 8.5 7.0 7.9 9.7 6.9  HGB 7.0* 6.8* 9.0* 9.7* 8.4*  HCT 21.6* 20.1* 26.1* 28.3* 25.8*  MCV 96.0 94.4 90.9 90.4 94.5  PLT 272 225 199 123* 159   Blood Culture    Component Value Date/Time   SDES BLOOD RIGHT ANTECUBITAL 07/21/2021 1915   SPECREQUEST  07/21/2021 1915    BOTTLES DRAWN AEROBIC ONLY Blood Culture results may not be optimal due to an inadequate volume of blood received in culture bottles   CULT   07/21/2021 1915    NO GROWTH 5 DAYS Performed at [place] [address], [place], [place] [address]    REPTSTATUS 07/26/2021 FINAL 07/21/2021 1915    Cardiac Enzymes: No results for input(s): "CKTOTAL", "CKMB", "CKMBINDEX", "TROPONINI" in the last 168 hours.  CBG: Recent Labs  Lab 08/08/21 2007 08/09/21 1227 08/09/21 1759 08/09/21 2114 08/10/21 0549  GLUCAP 101* 113* 69* 96 86   Iron Studies:  No results for input(s): "IRON", "TIBC", "TRANSFERRIN", "FERRITIN" in the last 72 hours.  @lablastinr3 @ Studies/Results: No results found. Medications:  sodium chloride 10 mL/hr at 07/27/21 0654    Chlorhexidine Gluconate Cloth  6 each Topical Daily   darbepoetin (ARANESP) injection - DIALYSIS  100 mcg Intravenous Q Thu-HD   feeding supplement (NEPRO CARB STEADY)  237 mL Oral BID BM   heparin injection (subcutaneous)  5,000 Units Subcutaneous Q8H   metoprolol tartrate  25 mg Oral BID   multivitamin  1 tablet Oral QHS   neomycin-bacitracin-polymyxin   Topical TID   pantoprazole  40 mg Oral Daily   polycarbophil  625 mg Oral BID   sodium chloride flush  10-40 mL Intracatheter Q12H   sodium chloride flush  3 mL Intravenous Q12H    Assessment/Plan: 1.  Acute kidney injury chronic kidney disease stage IIIa: Appears consistent with ATN of septic shock.  Initiated HD 7/10 for worsening azotemia/metabolic acidosis. Hopefully will be temporary HD until she has renal recovery. Labs did indicate ongoing dialysis needs-  however, as above, did not get dialysis yesterday and numbers improved.  Purewick hasn't been great at capturing urine volume.   IR placed RIJ temp HD catheter 7/14, then [facility] placement 7/20 w/ IR - have been doing HD on a TTS schedule but checking labs to look for renal recovery-  BUN and crt down for the first time from yesterday to today so will hold HD and follow -  has been set up at [facility] on MWF for AKI but maybe will not need ??  2.  Incarcerated ventral  hernia: Status post exploratory laparotomy with small bowel resection, adhesiolysis and hernia repair with JP drain placement (removed 7/16).  Ongoing follow-up by surgery. 3. Hyperphosphatemia: secondary to AKI: started renvela, po4 down to 7.5 on 7/18, down to 1.3 7/20--stopped binders, PO4 4.7 on 7/25, watch for now-  4.4 most recently   4.   Anemia: Possibly chronic from malabsorption given prior history of small bowel resection.  Exacerbated by acute/critical illness and perioperative blood losses.  Iron sat 14, ferrritin 162; received total of 500mg  of ferrlecit, last dose 7/15.  Hb dropped to 6.8 7/21, received transfusion, has been fairly stable since.  ESA start 7/20.  Will check iron stores again tomorrow  Louis Meckel

## 2021-08-10 NOTE — Progress Notes (Addendum)
PROGRESS NOTE    [name]  KGM:[id number] DOB: Apr 12, 1955 DOA: 07/21/2021 PCP: Mancel Bale, PA-C    Brief Narrative:   66 year old female with history of hypertension, CKD stage IIIa,hyperlipidemia, chronically incarcerated large abdominal wall hernia which was repaired multiple times, recent SBO resection with mesh placement who presented to the ER with severe abdominal pain, vomiting.  CT abdomen/pelvis showed large SBO, large lower abdominal ventral hernia containing much of the small bowel.  General surgery was consulted and patient was taken to the OR for emergent exploratory laparotomy and hernia repair,she was transferred to ICU.  Patient was hemodynamically stable and patient transferred to [facility] service on 07/28/21.  Patient is on on dialysis for persistent AKI, nephrology following.  General surgery on board.  PT recommending skilled nursing facility on discharge. Patient underwent IR placed right IJ temp HD catheter on 07/29/21, which was changed to right IJ [facility] catheter on 08/04/2021.   Assessment and Plan:  Principal Problem:   Ventral hernia with bowel obstruction Active Problems:   SIRS (systemic inflammatory response syndrome) (HCC)   Lactic acidosis   Leukocytosis   Acute renal failure superimposed on stage 3b chronic kidney disease (HCC)   Transient hypotension   Hypokalemia   History of small bowel obstruction   Body mass index 40.0-44.9, adult (HCC)   Strangulated recurrent ventral incisional hernia, s/p small intestine resection   Essential hypertension   H/O exploratory laparotomy   Septic shock (HCC)   Ventilator dependence ([place])   Protein-calorie malnutrition, severe   Incarcerated/strangulated ventral hernia with peritonitis.  -Status post small bowel resection, lysis of additions, repair of hernia and JP drain placement.  JP drain was removed on 07/31/2021.  Has completed antibiotic course.  General surgery has signed off at this time.  Continue soft diet.   Continue wound care dressing.   AKI on CKD stage 3a Secondary to ATN from septic shock secondary to peritonitis.  Patient was initiated on hemodialysis on 07/25/2021.  Nephrology following.  Status post permacath placement.  Patient will need hemodialysis as outpatient   Normocytic anemia:  -Likely associated with critical illness, chronic kidney disease, postop blood loss.  Status post 1 unit of packed RBC transfusion.  Hemoglobin of 8.4 at this time.  Intermittent sinus tachycardia - Unclear etiology.  Echo showed EF of more than 75%, no regional wall motion abnormality, grade 1 diastolic dysfunction, no valvular abnormality.   [place] was 5.3.  Continue metoprolol twice daily.   History of hypertension Amlodipine on hold.  Currently on metoprolol.   Acute hypoxic respiratory failure Status post surgery patient was temporarily on ventilator.  Currently extubated.   Obesity - BMI of 37.1.  Would benefit from weight loss as outpatient    Debility/deconditioning - Patient seen by PT and recommended skilled nursing facility on discharge.TOC following     DVT prophylaxis: heparin injection 5,000 Units Start: 08/04/21 1600   Code Status:     Code Status: Full Code  Disposition: Skilled nursing facility as per PT evaluation  Status is: Inpatient  Remains inpatient appropriate because: Hemodialysis, skilled nursing facility placement   Family Communication:  None at bedside  Consultants:  General surgery Nephrology  Procedures:   Right internal jugular temporary hemodialysis catheter placement on 07/29/21 and permacath placement on 08/04/2021 Status post small bowel resection, lysis of additions, repair of hernia and JP drain placement.  PRBC transfusion 1 unit Hemodialysis. Intubation, mechanical ventilation and extubation  Antimicrobials:  None currently  Anti-infectives (From admission, onward)  Start     Dose/Rate Route Frequency Ordered Stop   08/04/21 0845   ceFAZolin (ANCEF) IVPB 2g/100 mL premix        over 30 Minutes Intravenous Continuous PRN 08/04/21 0850 08/04/21 0845   08/04/21 0841  ceFAZolin (ANCEF) 2-4 GM/100ML-% IVPB       Note to Pharmacy: Peggyann Shoals: cabinet override      08/04/21 0841 08/04/21 0850   07/23/21 1730  piperacillin-tazobactam (ZOSYN) IVPB 2.25 g        2.25 g 100 mL/hr over 30 Minutes Intravenous Every 8 hours 07/23/21 1207 07/28/21 1019   07/22/21 0900  piperacillin-tazobactam (ZOSYN) IVPB 2.25 g  Status:  Discontinued        2.25 g 100 mL/hr over 30 Minutes Intravenous Every 8 hours 07/22/21 0833 07/23/21 1207   07/22/21 0830  piperacillin-tazobactam (ZOSYN) IVPB 3.375 g  Status:  Discontinued        3.375 g 100 mL/hr over 30 Minutes Intravenous Every 8 hours 07/22/21 0827 07/22/21 0831   07/22/21 0000  piperacillin-tazobactam (ZOSYN) IVPB 3.375 g        3.375 g 100 mL/hr over 30 Minutes Intravenous  Once 07/21/21 2352 07/22/21 0142      Subjective: Today, patient was seen and examined at bedside.  Patient denies any nausea vomiting, fever chills or rigor.  Objective: Vitals:   08/09/21 2304 08/10/21 0350 08/10/21 0824 08/10/21 1004  BP: 111/69 132/65 129/70   Pulse: 82 93 98 (!) 153  Resp: 17 14 18    Temp: 98.4 F (36.9 C) 98.2 F (36.8 C) 98.7 F (37.1 C)   TempSrc: Oral Oral Oral   SpO2: 98% 97% 99%   Height:        Intake/Output Summary (Last 24 hours) at 08/10/2021 1052 Last data filed at 08/09/2021 2005 Gross per 24 hour  Intake 340 ml  Output 200 ml  Net 140 ml   Filed Weights    Physical Examination: Body mass index is 34.12 kg/m.   General: Obese built, not in obvious distress HENT:   No scleral pallor or icterus noted. Oral mucosa is moist.  Chest:  Clear breath sounds.  Diminished breath sounds bilaterally.  Right chest wall permacath in place. CVS: S1 &S2 heard. No murmur.  Regular rate and rhythm. Abdomen: Soft, nontender, nondistended.  Bowel sounds are heard.   Abdominal wall dressing in place. Extremities: No cyanosis, clubbing with bilateral lower extremity trace edema.  Peripheral pulses are palpable. Psych: Alert, awake and oriented, normal mood CNS:  No cranial nerve deficits.  Power equal in all extremities.   Skin: Warm and dry.  No rashes noted.  Data Reviewed:   CBC: Recent Labs  Lab 08/04/21 0450 08/05/21 0212 08/06/21 0720 08/07/21 1030 08/09/21 1428  WBC 8.5 7.0 7.9 9.7 6.9  HGB 7.0* 6.8* 9.0* 9.7* 8.4*  HCT 21.6* 20.1* 26.1* 28.3* 25.8*  MCV 96.0 94.4 90.9 90.4 94.5  PLT 272 225 199 123* 676    Basic Metabolic Panel: Recent Labs  Lab 08/06/21 0720 08/08/21 0455 08/09/21 0131 08/09/21 1428 08/10/21 0137  NA 139 139 138 141 139  K 3.9 3.8 3.5 3.8 3.7  CL 102 103 103 103 105  CO2 26 25 23 25 22   GLUCOSE 99 96 97 89 88  BUN 39* 27* 34* 39* 33*  CREATININE 5.68* 4.49* 4.89* 4.87* 4.25*  CALCIUM 8.0* 8.3* 8.2* 8.4* 8.2*  PHOS 4.8* 4.3 4.7* 4.7* 4.4    Liver Function Tests: Recent  Labs  Lab 08/04/21 0450 08/04/21 1638 08/06/21 0720 08/08/21 0455 08/09/21 0131 08/09/21 1428 08/10/21 0137  AST 17  --   --   --   --   --   --   ALT 16  --   --   --   --   --   --   ALKPHOS 42  --   --   --   --   --   --   BILITOT 0.8  --   --   --   --   --   --   PROT 5.2*  --   --   --   --   --   --   ALBUMIN 1.6*   < > 1.9* 1.8* 1.8* 1.8* 1.9*   < > = values in this interval not displayed.     Radiology Studies: No results found.    LOS: 19 days    Flora Lipps, MD [facility] Available via Epic secure chat 7am-7pm After these hours, please refer to coverage provider listed on [email or web address] 08/10/2021, 10:52 AM

## 2021-08-11 DIAGNOSIS — Z6841 Body Mass Index (BMI) 40.0 and over, adult: Secondary | ICD-10-CM | POA: Diagnosis not present

## 2021-08-11 DIAGNOSIS — N179 Acute kidney failure, unspecified: Secondary | ICD-10-CM | POA: Diagnosis not present

## 2021-08-11 DIAGNOSIS — K436 Other and unspecified ventral hernia with obstruction, without gangrene: Secondary | ICD-10-CM | POA: Diagnosis not present

## 2021-08-11 DIAGNOSIS — I10 Essential (primary) hypertension: Secondary | ICD-10-CM | POA: Diagnosis not present

## 2021-08-11 LAB — GLUCOSE, CAPILLARY
Glucose-Capillary: 96 mg/dL (ref 70–99)
Glucose-Capillary: 121 mg/dL — ABNORMAL HIGH (ref 70–99)
Glucose-Capillary: 87 mg/dL (ref 70–99)
Glucose-Capillary: 98 mg/dL (ref 70–99)

## 2021-08-11 LAB — CBC
HCT: 24.1 % — ABNORMAL LOW (ref 36.0–46.0)
Hemoglobin: 7.8 g/dL — ABNORMAL LOW (ref 12.0–15.0)
MCH: 30.4 pg (ref 26.0–34.0)
MCHC: 32.4 g/dL (ref 30.0–36.0)
MCV: 93.8 fL (ref 80.0–100.0)
Platelets: 168 10*3/uL (ref 150–400)
RBC: 2.57 MIL/uL — ABNORMAL LOW (ref 3.87–5.11)
RDW: 17.7 % — ABNORMAL HIGH (ref 11.5–15.5)
WBC: 8.4 10*3/uL (ref 4.0–10.5)
nRBC: 0 % (ref 0.0–0.2)

## 2021-08-11 LAB — RENAL FUNCTION PANEL
Albumin: 1.7 g/dL — ABNORMAL LOW (ref 3.5–5.0)
Anion gap: 9 (ref 5–15)
BUN: 34 mg/dL — ABNORMAL HIGH (ref 8–23)
CO2: 25 mmol/L (ref 22–32)
Calcium: 8.3 mg/dL — ABNORMAL LOW (ref 8.9–10.3)
Chloride: 106 mmol/L (ref 98–111)
Creatinine, Ser: 3.92 mg/dL — ABNORMAL HIGH (ref 0.44–1.00)
GFR, Estimated: 12 mL/min — ABNORMAL LOW (ref >60)
Glucose, Bld: 97 mg/dL (ref 70–99)
Phosphorus: 4 mg/dL (ref 2.5–4.6)
Potassium: 3.5 mmol/L (ref 3.5–5.1)
Sodium: 140 mmol/L (ref 135–145)

## 2021-08-11 LAB — IRON AND TIBC
Iron: 16 ug/dL — ABNORMAL LOW (ref 28–170)
Saturation Ratios: 13 % (ref 10.4–31.8)
TIBC: 120 ug/dL — ABNORMAL LOW (ref 250–450)
UIBC: 104 ug/dL

## 2021-08-11 LAB — FERRITIN: Ferritin: 324 ng/mL — ABNORMAL HIGH (ref 11–307)

## 2021-08-11 LAB — MAGNESIUM: Magnesium: 1.3 mg/dL — ABNORMAL LOW (ref 1.7–2.4)

## 2021-08-11 MED ORDER — COLLAGENASE 250 UNIT/GM EX OINT
TOPICAL_OINTMENT | Freq: Every day | CUTANEOUS | Status: DC
  Filled 2021-08-11 (×2): qty 30

## 2021-08-11 MED ORDER — SODIUM CHLORIDE 0.9 % IV SOLN
250.0000 mg | Freq: Every day | INTRAVENOUS | Status: AC
  Administered 2021-08-11: 250 mg via INTRAVENOUS
  Filled 2021-08-11 (×4): qty 20

## 2021-08-11 MED ORDER — MAGNESIUM OXIDE -MG SUPPLEMENT 400 (240 MG) MG PO TABS
400.0000 mg | ORAL_TABLET | Freq: Two times a day (BID) | ORAL | Status: DC
  Administered 2021-08-11 – 2021-08-16 (×11): 400 mg via ORAL
  Filled 2021-08-11 (×10): qty 1

## 2021-08-11 NOTE — Progress Notes (Signed)
20 Days Post-Op  Subjective: CC: Doing well. No abdominal pain, n/v. Tolerating diet. Having bowel movements.   Objective: Vital signs in last 24 hours: Temp:  [97.8 F (36.6 C)-98.1 F (36.7 C)] 98 F (36.7 C) (07/27 0421) Pulse Rate:  [92-155] 155 (07/27 0919) Resp:  [16-19] 17 (07/27 0421) BP: (108-133)/(63-75) 122/66 (07/27 0421) SpO2:  [97 %-100 %] 97 % (07/27 0421) Last BM Date : 08/09/21  Intake/Output from previous day: 07/26 0701 - 07/27 0700 In: 240 [P.O.:240] Out: 150 [Urine:150] Intake/Output this shift: Total I/O In: -  Out: 125 [Urine:125]  PE: Gen:  Alert, NAD, pleasant Abd: Soft, ND, NT, +BS. Incision/wound as noted below with some granulation tissue but mostly fibrinous tissue at the base. Appears to track at least 8cm superiorly. No drainage.      Lab Results:  Recent Labs    08/09/21 1428 08/11/21 0136  WBC 6.9 8.4  HGB 8.4* 7.8*  HCT 25.8* 24.1*  PLT 159 168   BMET Recent Labs    08/10/21 0137 08/11/21 0136  NA 139 140  K 3.7 3.5  CL 105 106  CO2 22 25  GLUCOSE 88 97  BUN 33* 34*  CREATININE 4.25* 3.92*  CALCIUM 8.2* 8.3*   PT/INR No results for input(s): "LABPROT", "INR" in the last 72 hours. CMP     Component Value Date/Time   NA 140 08/11/2021 0136   NA 146 (H) 07/11/2017 1547   K 3.5 08/11/2021 0136   CL 106 08/11/2021 0136   CO2 25 08/11/2021 0136   GLUCOSE 97 08/11/2021 0136   BUN 34 (H) 08/11/2021 0136   BUN 14 07/11/2017 1547   CREATININE 3.92 (H) 08/11/2021 0136   CREATININE 1.04 05/06/2014 1538   CALCIUM 8.3 (L) 08/11/2021 0136   PROT 5.2 (L) 08/04/2021 0450   PROT 6.1 07/11/2017 1547   ALBUMIN 1.7 (L) 08/11/2021 0136   ALBUMIN 3.5 (L) 07/11/2017 1547   AST 17 08/04/2021 0450   ALT 16 08/04/2021 0450   ALKPHOS 42 08/04/2021 0450   BILITOT 0.8 08/04/2021 0450   BILITOT <0.2 07/11/2017 1547   GFRNONAA 12 (L) 08/11/2021 0136   GFRAA 50 (L) 09/13/2019 0755   Lipase     Component Value Date/Time    LIPASE 24 07/21/2021 2056    Studies/Results: No results found.  Anti-infectives: Anti-infectives (From admission, onward)    Start     Dose/Rate Route Frequency Ordered Stop   08/04/21 0845  ceFAZolin (ANCEF) IVPB 2g/100 mL premix        over 30 Minutes Intravenous Continuous PRN 08/04/21 0850 08/04/21 0845   08/04/21 0841  ceFAZolin (ANCEF) 2-4 GM/100ML-% IVPB       Note to Pharmacy: Peggyann Shoals: cabinet override      08/04/21 0841 08/04/21 0850   07/23/21 1730  piperacillin-tazobactam (ZOSYN) IVPB 2.25 g        2.25 g 100 mL/hr over 30 Minutes Intravenous Every 8 hours 07/23/21 1207 07/28/21 1019   07/22/21 0900  piperacillin-tazobactam (ZOSYN) IVPB 2.25 g  Status:  Discontinued        2.25 g 100 mL/hr over 30 Minutes Intravenous Every 8 hours 07/22/21 0833 07/23/21 1207   07/22/21 0830  piperacillin-tazobactam (ZOSYN) IVPB 3.375 g  Status:  Discontinued        3.375 g 100 mL/hr over 30 Minutes Intravenous Every 8 hours 07/22/21 0827 07/22/21 0831   07/22/21 0000  piperacillin-tazobactam (ZOSYN) IVPB 3.375 g  3.375 g 100 mL/hr over 30 Minutes Intravenous  Once 07/21/21 2352 07/22/21 0142        Assessment/Plan POD#20 s/p  exploratory laparotomy, small bowel resection, adhesiolysis x90 minutes, primary repair of incarcerated ventral hernia, JP drain placement 7/7 Dr. Bobbye Morton for incarcerated and strangulated ventral hernia - Drain removed at bedside 7/16 - Soft diet, bid fiber - Completed 5 days post-op abx - Encourage mobilization  - WOC saw regarding her skin excoriation etc. Please follow their wound care recommendations and monitor.  - Reviewed wound with attending. Will do santyl + BID WTD and continue to follow  - Awaiting SNF  ID - zosyn 7/7>7/12 FEN - soft diet VTE - SQH Foley - not present   - below per TRH -   Septic shock - off pressors 7/9 ARF - HD started 7/10, temp HD cath exchange 7/17. Nephrology following.  VDRF - extubated  7/11 Elevated troponin - cards has seen, suspect secondary to critical illness and not ACS HTN   LOS: 20 days    Jillyn Ledger , [facility] 08/11/2021, 11:55 AM Please see Amion for pager number during day hours 7:00am-4:30pm

## 2021-08-11 NOTE — Progress Notes (Signed)
[name] Progress Note   Subjective:  only 150 UOP recorded but she says more- crt is trending down off of HD !!!!!  She feels like is getting stronger every day but does admit to some dizziness upon activity -   PTA was working   Objective Vitals:   08/10/21 1830 08/10/21 2030 08/10/21 2305 08/11/21 0421  BP:  108/64 112/63 122/66  Pulse:  98 100 92  Resp: 18 19 17 17   Temp:  97.8 F (36.6 C) 98.1 F (36.7 C) 98 F (36.7 C)  TempSrc:  Oral Oral Oral  SpO2:  97% 98% 97%  Height:       Physical Exam General:  NAD, laying flat in bed Heart: RRR Lungs: cta b/l Abdomen: soft Extremities: no sig edema -  legs are obese Dialysis Access:  [facility]  Additional Objective Labs: Basic Metabolic Panel: Recent Labs  Lab 08/09/21 1428 08/10/21 0137 08/11/21 0136  NA 141 139 140  K 3.8 3.7 3.5  CL 103 105 106  CO2 25 22 25   GLUCOSE 89 88 97  BUN 39* 33* 34*  CREATININE 4.87* 4.25* 3.92*  CALCIUM 8.4* 8.2* 8.3*  PHOS 4.7* 4.4 4.0   Liver Function Tests: Recent Labs  Lab 08/09/21 1428 08/10/21 0137 08/11/21 0136  ALBUMIN 1.8* 1.9* 1.7*   No results for input(s): "LIPASE", "AMYLASE" in the last 168 hours.  CBC: Recent Labs  Lab 08/05/21 0212 08/06/21 0720 08/07/21 1030 08/09/21 1428 08/11/21 0136  WBC 7.0 7.9 9.7 6.9 8.4  HGB 6.8* 9.0* 9.7* 8.4* 7.8*  HCT 20.1* 26.1* 28.3* 25.8* 24.1*  MCV 94.4 90.9 90.4 94.5 93.8  PLT 225 199 123* 159 168   Blood Culture    Component Value Date/Time   SDES BLOOD RIGHT ANTECUBITAL 07/21/2021 1915   SPECREQUEST  07/21/2021 1915    BOTTLES DRAWN AEROBIC ONLY Blood Culture results may not be optimal due to an inadequate volume of blood received in culture bottles   CULT  07/21/2021 1915    NO GROWTH 5 DAYS Performed at [facility], [place] [address], [place], [place] [address]    REPTSTATUS 07/26/2021 FINAL 07/21/2021 1915    Cardiac Enzymes: No results for input(s): "CKTOTAL", "CKMB", "CKMBINDEX",  "TROPONINI" in the last 168 hours.  CBG: Recent Labs  Lab 08/10/21 0549 08/10/21 1201 08/10/21 1610 08/10/21 2113 08/11/21 0652  GLUCAP 86 107* 132* 114* 96   Iron Studies:  Recent Labs    08/11/21 0136  IRON 16*  TIBC 120*  FERRITIN 324*    @lablastinr3 @ Studies/Results: No results found. Medications:  sodium chloride 10 mL/hr at 07/27/21 0654    Chlorhexidine Gluconate Cloth  6 each Topical Daily   darbepoetin (ARANESP) injection - DIALYSIS  100 mcg Intravenous Q Thu-HD   feeding supplement (NEPRO CARB STEADY)  237 mL Oral BID BM   heparin injection (subcutaneous)  5,000 Units Subcutaneous Q8H   metoprolol tartrate  25 mg Oral BID   multivitamin  1 tablet Oral QHS   neomycin-bacitracin-polymyxin   Topical TID   pantoprazole  40 mg Oral Daily   polycarbophil  625 mg Oral BID   sodium chloride flush  10-40 mL Intracatheter Q12H   sodium chloride flush  3 mL Intravenous Q12H    Assessment/Plan: 1.  Acute kidney injury chronic kidney disease stage IIIa: Appears consistent with ATN of septic shock.  Initiated HD 7/10 for worsening azotemia/metabolic acidosis.  Labs did indicate ongoing dialysis needs-  however,  did not get dialysis 7/25 and numbers improved-  improved moreso today.  Have not  been great at capturing urine volume.   IR placed RIJ temp HD catheter 7/14, then [facility] placement 7/20 w/ IR - had been doing HD on a TTS schedule - last was on 7/22- now nonoliguric and showing signs of renal recovery-  BUN and crt down again today so will hold HD and follow -  has been set up at [facility] on MWF for AKI but maybe will not need ??  Keep TDC in for now just in case  2.  Incarcerated ventral hernia: Status post exploratory laparotomy with small bowel resection, adhesiolysis and hernia repair with JP drain placement (removed 7/16).  Ongoing follow-up by surgery. 3. Hyperphosphatemia: OK off of a binder  4.   Anemia: Possibly chronic from malabsorption given prior history of  small bowel resection.  Exacerbated by acute/critical illness and perioperative blood losses.  Iron sat 14, ferrritin 162; received total of 500mg  of ferrlecit, last dose 7/15.  Hb dropped to 6.8 on 7/21, received transfusion but drifting down again.  ESA start 7/20.  Needs more iron it appears, will order  [name]

## 2021-08-11 NOTE — Plan of Care (Signed)
?  Problem: Clinical Measurements: ?Goal: Will remain free from infection ?Outcome: Progressing ?  ?

## 2021-08-11 NOTE — Progress Notes (Signed)
PROGRESS NOTE    [name]  ENI:[id number] DOB: [birth year] DOA: 07/21/2021 PCP: Mancel Bale, PA-C    Brief Narrative:   66 year old female with history of hypertension, CKD stage IIIa,hyperlipidemia, chronically incarcerated large abdominal wall hernia which was repaired multiple times, recent SBO resection with mesh placement who presented to the ER with severe abdominal pain, vomiting.  CT abdomen/pelvis showed large SBO, large lower abdominal ventral hernia containing much of the small bowel.  General surgery was consulted and patient was taken to the OR for emergent exploratory laparotomy and hernia repair,she was then transferred to ICU.  Patient was hemodynamically stable and patient transferred to [facility] service on 07/28/21.    At this time, patient is on on dialysis for persistent AKI, nephrology following.  Patient underwent IR placed right IJ temp HD catheter on 07/29/21, which was changed to right IJ [facility] catheter on 08/04/2021.General surgery on board.  PT recommending skilled nursing facility on discharge.    Assessment and Plan:  Principal Problem:   Ventral hernia with bowel obstruction Active Problems:   SIRS (systemic inflammatory response syndrome) (HCC)   Lactic acidosis   Leukocytosis   Acute renal failure superimposed on stage 3b chronic kidney disease (HCC)   Transient hypotension   Hypokalemia   History of small bowel obstruction   Body mass index 40.0-44.9, adult ([place])   Strangulated recurrent ventral incisional hernia, s/p small intestine resection   Essential hypertension   H/O exploratory laparotomy   Septic shock (HCC)   Ventilator dependence ([place])   Protein-calorie malnutrition, severe   Hypomagnesemia   Incarcerated/strangulated ventral hernia with peritonitis.  -Status post small bowel resection, lysis of additions, repair of hernia and JP drain placement.  JP drain was removed on 07/31/2021.  Has completed antibiotic course.  General surgery has  signed off at this time.  Continue soft diet.  Continue wound care dressing.   AKI on CKD stage 3a Secondary to ATN from septic shock secondary to peritonitis.  Patient was initiated on hemodialysis on 07/25/2021.  Nephrology following.  Nephrology assessing the need for long-term hemodialysis.  Hypomagnesemia.  Magnesium of 1.3 today.  We will replace orally.  Check levels in AM.   Normocytic anemia:  -Likely associated with critical illness, chronic kidney disease, postop blood loss.  Status post 1 unit of packed RBC transfusion.  Hemoglobin of 7.8.  Intermittent sinus tachycardia - Unclear etiology.  Echo showed EF of more than 75%, no regional wall motion abnormality, grade 1 diastolic dysfunction, no valvular abnormality.  TSH was slightly elevated.  Continue metoprolol twice daily.   History of hypertension Amlodipine on hold.  Currently on metoprolol.  Blood pressure seems to be stable.   Acute hypoxic respiratory failure Status post surgery patient was temporarily on a ventilator.  Currently extubated.  Pending distress.  Currently on room air   Obesity - BMI of 37.1.  Would benefit from weight loss as outpatient    Debility/deconditioning - Patient seen by PT and has recommended skilled nursing facility on discharge.TOC following     DVT prophylaxis: heparin injection 5,000 Units Start: 08/04/21 1600   Code Status:     Code Status: Full Code  Disposition: Skilled nursing facility as per PT evaluation  Status is: Inpatient  Remains inpatient appropriate because: Assessment for long-term hemodialysis, skilled nursing facility placement   Family Communication:  None at bedside  Consultants:  General surgery Nephrology  Procedures:   Right internal jugular temporary hemodialysis catheter placement on 07/29/21 and permacath  placement on 08/04/2021 Status post small bowel resection, lysis of additions, repair of hernia and JP drain placement.  PRBC transfusion 1  unit Hemodialysis. Intubation, mechanical ventilation and extubation  Antimicrobials:  None currently  Anti-infectives (From admission, onward)    Start     Dose/Rate Route Frequency Ordered Stop   08/04/21 0845  ceFAZolin (ANCEF) IVPB 2g/100 mL premix        over 30 Minutes Intravenous Continuous PRN 08/04/21 0850 08/04/21 0845   08/04/21 0841  ceFAZolin (ANCEF) 2-4 GM/100ML-% IVPB       Note to Pharmacy: Peggyann Shoals: cabinet override      08/04/21 0841 08/04/21 0850   07/23/21 1730  piperacillin-tazobactam (ZOSYN) IVPB 2.25 g        2.25 g 100 mL/hr over 30 Minutes Intravenous Every 8 hours 07/23/21 1207 07/28/21 1019   07/22/21 0900  piperacillin-tazobactam (ZOSYN) IVPB 2.25 g  Status:  Discontinued        2.25 g 100 mL/hr over 30 Minutes Intravenous Every 8 hours 07/22/21 0833 07/23/21 1207   07/22/21 0830  piperacillin-tazobactam (ZOSYN) IVPB 3.375 g  Status:  Discontinued        3.375 g 100 mL/hr over 30 Minutes Intravenous Every 8 hours 07/22/21 0827 07/22/21 0831   07/22/21 0000  piperacillin-tazobactam (ZOSYN) IVPB 3.375 g        3.375 g 100 mL/hr over 30 Minutes Intravenous  Once 07/21/21 2352 07/22/21 0142      Subjective: Today, patient was seen and examined at bedside.  Patient denies any nausea vomiting fevers chills.  Has been eating better.  Has had a bowel movement.  Denies any shortness of breath, cough or chest pain.  Objective: Vitals:   08/10/21 1830 08/10/21 2030 08/10/21 2305 08/11/21 0421  BP:  108/64 112/63 122/66  Pulse:  98 100 92  Resp: 18 19 17 17   Temp:  97.8 F (36.6 C) 98.1 F (36.7 C) 98 F (36.7 C)  TempSrc:  Oral Oral Oral  SpO2:  97% 98% 97%  Height:        Intake/Output Summary (Last 24 hours) at 08/11/2021 0805 Last data filed at 08/11/2021 0000 Gross per 24 hour  Intake 240 ml  Output 150 ml  Net 90 ml   Filed Weights    Physical Examination: Body mass index is 34.12 kg/m.   General: Obese  built, not in  obvious distress HENT:   No scleral pallor or icterus noted. Oral mucosa is moist.  Chest:    Diminished breath sounds bilaterally. No crackles or wheezes.  Right chest wall hemodialysis catheter CVS: S1 &S2 heard. No murmur.  Regular rate and rhythm. Abdomen: Soft, nontender, nondistended.  Bowel sounds are heard.  Abdominal wall dressing in place Extremities: No cyanosis, clubbing with trace edema peripheral pulses are palpable. Psych: Alert, awake and oriented, normal mood CNS:  No cranial nerve deficits.  Power equal in all extremities.   Skin: Warm and dry.  No rashes noted.   Data Reviewed:   CBC: Recent Labs  Lab 08/05/21 0212 08/06/21 0720 08/07/21 1030 08/09/21 1428 08/11/21 0136  WBC 7.0 7.9 9.7 6.9 8.4  HGB 6.8* 9.0* 9.7* 8.4* 7.8*  HCT 20.1* 26.1* 28.3* 25.8* 24.1*  MCV 94.4 90.9 90.4 94.5 93.8  PLT 225 199 123* 159 161    Basic Metabolic Panel: Recent Labs  Lab 08/08/21 0455 08/09/21 0131 08/09/21 1428 08/10/21 0137 08/11/21 0136  NA 139 138 141 139 140  K 3.8 3.5  3.8 3.7 3.5  CL 103 103 103 105 106  CO2 25 23 25 22 25   GLUCOSE 96 97 89 88 97  BUN 27* 34* 39* 33* 34*  CREATININE 4.49* 4.89* 4.87* 4.25* 3.92*  CALCIUM 8.3* 8.2* 8.4* 8.2* 8.3*  MG  --   --   --   --  1.3*  PHOS 4.3 4.7* 4.7* 4.4 4.0    Liver Function Tests: Recent Labs  Lab 08/08/21 0455 08/09/21 0131 08/09/21 1428 08/10/21 0137 08/11/21 0136  ALBUMIN 1.8* 1.8* 1.8* 1.9* 1.7*     Radiology Studies: No results found.    LOS: 20 days    Flora Lipps, MD [facility] Available via Epic secure chat 7am-7pm After these hours, please refer to coverage provider listed on [email or web address] 08/11/2021, 8:05 AM

## 2021-08-11 NOTE — Progress Notes (Signed)
Order received for midline placement. Secure chat sent to Dr. Myna Hidalgo and Butch Penny RN. Notified them that this patient is not appropriate for a midline/PICC placement d/t renal function. Shelly RN VAST assessed this patient earlier today. Please see progress note from 1259. Butch Penny RN VU. Fran Lowes, RN VAST

## 2021-08-11 NOTE — Progress Notes (Signed)
Notified by CCMD patient had a burst of SVT. Patient sleeping and denies CP/SOB.  Will continue to monitor

## 2021-08-11 NOTE — Plan of Care (Signed)
  Problem: Clinical Measurements: Goal: Respiratory complications will improve Outcome: Progressing Goal: Cardiovascular complication will be avoided Outcome: Progressing   

## 2021-08-11 NOTE — Progress Notes (Signed)
Notified Dr. Myna Hidalgo that this patient does not have IV access. IV team accessed earlier and was not able to get access. As noted by IV team, patient is not appropriate for midline or PICC d/t being a dialysis patient.   Will continue to monitor

## 2021-08-11 NOTE — Progress Notes (Signed)
Mobility Specialist: Progress Note   08/11/21 1612  Mobility  Activity Refused mobility   Pt refused mobility stating she feels too tired for mobility. Despite encouragement pt still refusing and asking this MS to return later. Will f/u as able.  [facility] Charissa Knowles Mobility Specialist Mobility Specialist [address]: [phone]

## 2021-08-11 NOTE — Progress Notes (Signed)
VAST consult received to obtain IV access. Bilateral arms assessed utilizing ultrasound; vessels too small or too deep and not appropriate for placement of USGIV. Unit RN notified.

## 2021-08-11 NOTE — Progress Notes (Signed)
Physical Therapy Treatment Patient Details Name: [name] MRN: [id number] DOB: May 30, 1955 Today's Date: 08/11/2021   History of Present Illness 66 y.o. female admitted 07/21/21 with abdominal pain due to incarcerated ventral hernia and SBO. S/p emergent ex lap, small bowel resection, incarcerated ventral hernia repair with JP drain placement on 7/7. ETT 7/7-7/11. Course complicated by AKI on CKD 3; HD initiated 7/10. 7/20 Rt IJ TDC placed. PMHx:chronic incarcerated hernia s/p multiple repairs, SBO, HTN, HLD, CKD    PT Comments    Pt very pleasant and willing to progress mobility. Pt with increased gait trials limited by toileting needs and HR with activity. Pt educated for HEP and encouraged to continue OOB mobility with nursing staff. Will continue to follow.   HR 110-158   Recommendations for follow up therapy are one component of a multi-disciplinary discharge planning process, led by the attending physician.  Recommendations may be updated based on patient status, additional functional criteria and insurance authorization.  Follow Up Recommendations  Skilled nursing-short term rehab (<3 hours/day) Can patient physically be transported by private vehicle: Yes   Assistance Recommended at Discharge Frequent or constant Supervision/Assistance  Patient can return home with the following A little help with walking and/or transfers;A little help with bathing/dressing/bathroom;Assistance with cooking/housework;Assist for transportation;Direct supervision/assist for medications management;Help with stairs or ramp for entrance   Equipment Recommendations  Rollator (4 wheels);BSC/3in1    Recommendations for Other Services       Precautions / Restrictions Precautions Precautions: Fall;Other (comment) Precaution Comments: watch HR Restrictions Weight Bearing Restrictions: No     Mobility  Bed Mobility Overal bed mobility: Modified Independent Bed Mobility: Supine to Sit            General bed mobility comments: HOB 40 degrees with use of rail    Transfers Overall transfer level: Needs assistance   Transfers: Sit to/from Stand Sit to Stand: Min guard, Min assist           General transfer comment: min assist to rise from bed and from chair x 1, minguard to rise from [facility] x 2 and from recliner x 1, cues for hand placement    Ambulation/Gait Ambulation/Gait assistance: Min guard Gait Distance (Feet): 32 Feet Assistive device: Rolling walker (2 wheels) Gait Pattern/deviations: Step-through pattern, Decreased stride length   Gait velocity interpretation: <1.8 ft/sec, indicate of risk for recurrent falls   General Gait Details: pt walked 20', 32', 22', 20' respectively with close chair follow and max HR 158 with seated rest between trials and HR 110 at rest   Stairs             Wheelchair Mobility    Modified Rankin (Stroke Patients Only)       Balance Overall balance assessment: Needs assistance Sitting-balance support: No upper extremity supported Sitting balance-Leahy Scale: Good Sitting balance - Comments: able to sit on EOB and at toilet   Standing balance support: Reliant on assistive device for balance, Bilateral upper extremity supported Standing balance-Leahy Scale: Fair Standing balance comment: static standing to perform pericare, RW for gait                            Cognition Arousal/Alertness: Awake/alert Behavior During Therapy: WFL for tasks assessed/performed Overall Cognitive Status: Within Functional Limits for tasks assessed  Exercises General Exercises - Lower Extremity Long Arc Quad: AROM, Seated, Both, 20 reps Hip Flexion/Marching: AROM, Both, 20 reps, Seated    General Comments        Pertinent Vitals/Pain Pain Assessment Pain Assessment: No/denies pain    Home Living                          Prior Function             PT Goals (current goals can now be found in the care plan section) Progress towards PT goals: Progressing toward goals    Frequency    Min 3X/week      PT Plan Current plan remains appropriate    Co-evaluation              AM-PAC PT "6 Clicks" Mobility   Outcome Measure  Help needed turning from your back to your side while in a flat bed without using bedrails?: A Little Help needed moving from lying on your back to sitting on the side of a flat bed without using bedrails?: A Little Help needed moving to and from a bed to a chair (including a wheelchair)?: A Little Help needed standing up from a chair using your arms (e.g., wheelchair or bedside chair)?: A Little Help needed to walk in hospital room?: A Lot Help needed climbing 3-5 steps with a railing? : Total 6 Click Score: 15    End of Session   Activity Tolerance: Patient tolerated treatment well Patient left: in chair;with call bell/phone within reach Nurse Communication: Mobility status PT Visit Diagnosis: Other abnormalities of gait and mobility (R26.89);Muscle weakness (generalized) (M62.81);Difficulty in walking, not elsewhere classified (R26.2)     Time: 8185-6314 PT Time Calculation (min) (ACUTE ONLY): 37 min  Charges:  $Gait Training: 8-22 mins $Therapeutic Exercise: 8-22 mins                     Bayard Males, PT Acute Rehabilitation Services Office: [place] 08/11/2021, 9:36 AM

## 2021-08-12 DIAGNOSIS — N179 Acute kidney failure, unspecified: Secondary | ICD-10-CM | POA: Diagnosis not present

## 2021-08-12 DIAGNOSIS — Z6841 Body Mass Index (BMI) 40.0 and over, adult: Secondary | ICD-10-CM | POA: Diagnosis not present

## 2021-08-12 DIAGNOSIS — I10 Essential (primary) hypertension: Secondary | ICD-10-CM | POA: Diagnosis not present

## 2021-08-12 DIAGNOSIS — K436 Other and unspecified ventral hernia with obstruction, without gangrene: Secondary | ICD-10-CM | POA: Diagnosis not present

## 2021-08-12 LAB — RENAL FUNCTION PANEL
Albumin: 1.7 g/dL — ABNORMAL LOW (ref 3.5–5.0)
Anion gap: 10 (ref 5–15)
BUN: 32 mg/dL — ABNORMAL HIGH (ref 8–23)
CO2: 25 mmol/L (ref 22–32)
Calcium: 8.4 mg/dL — ABNORMAL LOW (ref 8.9–10.3)
Chloride: 104 mmol/L (ref 98–111)
Creatinine, Ser: 3.58 mg/dL — ABNORMAL HIGH (ref 0.44–1.00)
GFR, Estimated: 13 mL/min — ABNORMAL LOW (ref >60)
Glucose, Bld: 98 mg/dL (ref 70–99)
Phosphorus: 3.6 mg/dL (ref 2.5–4.6)
Potassium: 3.7 mmol/L (ref 3.5–5.1)
Sodium: 139 mmol/L (ref 135–145)

## 2021-08-12 LAB — GLUCOSE, CAPILLARY
Glucose-Capillary: 77 mg/dL (ref 70–99)
Glucose-Capillary: 85 mg/dL (ref 70–99)
Glucose-Capillary: 81 mg/dL (ref 70–99)
Glucose-Capillary: 91 mg/dL (ref 70–99)

## 2021-08-12 LAB — MAGNESIUM: Magnesium: 1.4 mg/dL — ABNORMAL LOW (ref 1.7–2.4)

## 2021-08-12 MED ORDER — POLYETHYLENE GLYCOL 3350 17 G PO PACK
17.0000 g | PACK | Freq: Every day | ORAL | Status: DC
  Administered 2021-08-14 – 2021-08-15 (×2): 17 g via ORAL
  Filled 2021-08-12 (×2): qty 1

## 2021-08-12 MED ORDER — POLYSACCHARIDE IRON COMPLEX 150 MG PO CAPS
150.0000 mg | ORAL_CAPSULE | Freq: Every day | ORAL | Status: DC
  Administered 2021-08-12 – 2021-08-16 (×5): 150 mg via ORAL
  Filled 2021-08-12 (×5): qty 1

## 2021-08-12 MED ORDER — JUVEN PO PACK
1.0000 | PACK | Freq: Two times a day (BID) | ORAL | Status: DC
Start: 2021-08-13 — End: 2021-08-16
  Administered 2021-08-13 – 2021-08-15 (×5): 1 via ORAL
  Filled 2021-08-12 (×6): qty 1

## 2021-08-12 MED ORDER — DARBEPOETIN ALFA 100 MCG/0.5ML IJ SOSY
100.0000 ug | PREFILLED_SYRINGE | INTRAMUSCULAR | Status: DC
Start: 2021-08-12 — End: 2021-08-16
  Administered 2021-08-12: 100 ug via SUBCUTANEOUS
  Filled 2021-08-12: qty 0.5

## 2021-08-12 MED ORDER — MAGNESIUM SULFATE 2 GM/50ML IV SOLN: 2.0000 g | Freq: Once | INTRAVENOUS | Status: DC

## 2021-08-12 NOTE — Progress Notes (Signed)
Nutrition Follow-up  DOCUMENTATION CODES:   Severe malnutrition in context of chronic illness  INTERVENTION:  Continue Nepro Shake po BID, each supplement provides 425 kcal and 19 grams protein Discontinue Magic cup TID with meals, each supplement provides 290 kcal and 9 grams of protein 1 packet Juven BID, each packet provides 95 calories, 2.5 grams of protein (collagen), and 9.8 grams of carbohydrate (3 grams sugar); +vitamins and minerals to support wound healing Order snacks TID Continue Renal MVI with minerals daily  NUTRITION DIAGNOSIS:   Severe Malnutrition related to chronic illness (chronic incarcerated hernia) as evidenced by severe muscle depletion, severe fat depletion.  Ongoing  GOAL:   Patient will meet greater than or equal to 90% of their needs  Goal not met-addressing via meals and snacks  MONITOR:   Diet advancement, I & O's  REASON FOR ASSESSMENT:   Rounds    ASSESSMENT:   Pt with PMH of HTN, HLD and chronically incarcerated large abd wall hernia repaired multiple times, most recently SBO resection with mesh placement in 2012 with hernia recurrence. Pt admitted with abd pain and vomiting due to SBO with large lower abd ventral wall hernia containing most of her small bowel.  Per Nephrology, holding off on HD for now as pt's creatinine has been trending downward.  Pt states that her intake is about the same as it has been throughout admission. Endorses ongoing early satiety. He eats better for some meals over others. Continues to drink half to one whole Nepro shake daily. She is not eating magic cup as she does not like the flavor or texture. Discontinued these supplements and ordered snacks TID to help increase pt's intake. Will also order Juven to help support wound healing.   Meal completions: 07/24: 10%-dinner 07/25: 100%-dinner 07/26: 50%-lunch 07/28: 100%-breakfast, 25%-lunch  Last documented wt: 07/17- 81.9 kg (181 lbs)  Medications:  niferex, mag-ox, rena-vit, protonix, fibercon  Labs: BUN 32, Cr 3.58, Mg 1.4, GFR 13, CBGs 77-121 x24 hours  UOP: 457m x24 hours I/O's: -24072msince admission  Diet Order:   Diet Order             DIET DYS 3 Room service appropriate? Yes with Assist; Fluid consistency: Thin  Diet effective now                   EDUCATION NEEDS:   Not appropriate for education at this time  Skin:  Skin Assessment: Skin Integrity Issues: Skin Integrity Issues:: Incisions, Other (Comment) Incisions: abdomen (open) Other: MASD- thigh, abdomen, buttock  Last BM:  7/25  Height:   Ht Readings from Last 1 Encounters:  07/22/21 5' 1" (1.549 m)    Weight:   Wt Readings from Last 1 Encounters:  09/13/17 97.8 kg   BMI:  Body mass index is 34.12 kg/m.  Estimated Nutritional Needs:   Kcal:  1600-1800  Protein:  80-100 grams  Fluid:  >1.6 L/day  AlClayborne DanaRDN, LDN Clinical Nutrition

## 2021-08-12 NOTE — Progress Notes (Signed)
PROGRESS NOTE    [name]  KPT:[id number] DOB: [birth year] DOA: 07/21/2021 PCP: Mancel Bale, PA-C    Brief Narrative:   66 year old female with history of hypertension, CKD stage IIIa,hyperlipidemia, chronically incarcerated large abdominal wall hernia which was repaired multiple times, recent SBO resection with mesh placement who presented to the ER with severe abdominal pain, vomiting.  CT abdomen/pelvis showed large SBO, large lower abdominal ventral hernia containing much of the small bowel.  General surgery was consulted and patient was taken to the OR for emergent exploratory laparotomy and hernia repair,she was then transferred to ICU.  Patient was hemodynamically stable and patient transferred to [facility] service on 07/28/21.    At this time, patient is on on dialysis for persistent AKI, nephrology following. Patient underwent IR placed right IJ temp HD catheter on 07/29/21, which was changed to right IJ [facility] catheter on 08/04/2021.General surgery on board.  PT recommending skilled nursing facility on discharge.    Assessment and Plan:  Principal Problem:   Ventral hernia with bowel obstruction Active Problems:   SIRS (systemic inflammatory response syndrome) (HCC)   Lactic acidosis   Leukocytosis   Acute renal failure superimposed on stage 3b chronic kidney disease (HCC)   Transient hypotension   Hypokalemia   History of small bowel obstruction   Body mass index 40.0-44.9, adult ([place])   Strangulated recurrent ventral incisional hernia, s/p small intestine resection   Essential hypertension   H/O exploratory laparotomy   Septic shock (HCC)   Ventilator dependence ([place])   Protein-calorie malnutrition, severe   Hypomagnesemia   Incarcerated/strangulated ventral hernia with peritonitis.  -Status post small bowel resection, lysis of additions, repair of hernia and JP drain placement.  JP drain was removed on 07/31/2021.  Has completed antibiotic course.  General surgery has  signed off at this time.  Continue dysphagia diet.  Continue wound care dressing.   AKI on CKD stage 3a Secondary to ATN from septic shock secondary to peritonitis.  Patient was initiated on hemodialysis on 07/25/2021.  Nephrology following.  Patient unlikely to need long-term hemodialysis but nephrology wishes to see creatinine trend less than 3  Hypomagnesemia.  Magnesium of 1.3 yesterday.  We will continue with oral magnesium oxide.  Magnesium 1.4 today.   Normocytic anemia:  -Likely associated with critical illness, chronic kidney disease, postop blood loss.  Status post 1 unit of packed RBC transfusion.  Latest hemoglobin of 7.8.  Intermittent sinus tachycardia - Unclear etiology.  Echo showed EF of more than 75%, no regional wall motion abnormality, grade 1 diastolic dysfunction, no valvular abnormality.  TSH was slightly elevated.  Continue metoprolol twice daily.   History of hypertension Amlodipine on hold.  Currently on metoprolol.  Blood pressure seems to be stable.   Acute hypoxic respiratory failure Status post surgery, patient was temporarily on a ventilator.   Currently on room air   Obesity - BMI of 37.1.  Would benefit from weight loss as outpatient    Debility/deconditioning - Patient seen by PT and has recommended skilled nursing facility on discharge.TOC following     DVT prophylaxis: heparin injection 5,000 Units Start: 08/04/21 1600   Code Status:     Code Status: Full Code  Disposition: Skilled nursing facility as per PT evaluation.    Status is: Inpatient  Remains inpatient appropriate because: Assessment for long-term hemodialysis, skilled nursing facility placement   Family Communication:  None at bedside  Consultants:  General surgery Nephrology  Procedures:   Right internal jugular  temporary hemodialysis catheter placement on 07/29/21 and permacath placement on 08/04/2021 Status post small bowel resection, lysis of additions, repair of hernia  and JP drain placement.  PRBC transfusion 1 unit Hemodialysis. Intubation, mechanical ventilation and extubation  Antimicrobials:  None currently  Anti-infectives (From admission, onward)    Start     Dose/Rate Route Frequency Ordered Stop   08/04/21 0845  ceFAZolin (ANCEF) IVPB 2g/100 mL premix        over 30 Minutes Intravenous Continuous PRN 08/04/21 0850 08/04/21 0845   08/04/21 0841  ceFAZolin (ANCEF) 2-4 GM/100ML-% IVPB       Note to Pharmacy: Peggyann Shoals: cabinet override      08/04/21 0841 08/04/21 0850   07/23/21 1730  piperacillin-tazobactam (ZOSYN) IVPB 2.25 g        2.25 g 100 mL/hr over 30 Minutes Intravenous Every 8 hours 07/23/21 1207 07/28/21 1019   07/22/21 0900  piperacillin-tazobactam (ZOSYN) IVPB 2.25 g  Status:  Discontinued        2.25 g 100 mL/hr over 30 Minutes Intravenous Every 8 hours 07/22/21 0833 07/23/21 1207   07/22/21 0830  piperacillin-tazobactam (ZOSYN) IVPB 3.375 g  Status:  Discontinued        3.375 g 100 mL/hr over 30 Minutes Intravenous Every 8 hours 07/22/21 0827 07/22/21 0831   07/22/21 0000  piperacillin-tazobactam (ZOSYN) IVPB 3.375 g        3.375 g 100 mL/hr over 30 Minutes Intravenous  Once 07/21/21 2352 07/22/21 0142      Subjective: Today, patient was seen and examined at bedside.  Denies any nausea vomiting fever chills or rigors.  No shortness of breath.    Objective: Vitals:   08/11/21 1957 08/11/21 2300 08/12/21 0319 08/12/21 0751  BP: 103/67 (!) 101/59 101/61 (!) 113/58  Pulse: 98 80 85 86  Resp:  14 15 14   Temp: 98.2 F (36.8 C) 97.9 F (36.6 C) 97.7 F (36.5 C) 97.7 F (36.5 C)  TempSrc: Oral Oral Oral Oral  SpO2: 97% 96% 98% 100%  Height:        Intake/Output Summary (Last 24 hours) at 08/12/2021 1121 Last data filed at 08/12/2021 0320 Gross per 24 hour  Intake 0 ml  Output 305 ml  Net -305 ml    Filed Weights    Physical Examination: Body mass index is 34.12 kg/m.   General: Obese built, not  in obvious distress HENT:   No scleral pallor or icterus noted. Oral mucosa is moist.  Chest:  Clear breath sounds.  Diminished breath sounds bilaterally. No crackles or wheezes.  Right chest wall hemodialysis catheter in place. CVS: S1 &S2 heard. No murmur.  Regular rate and rhythm. Abdomen: Soft, nontender, nondistended.  Bowel sounds are heard.  Abdominal wall dressing. Extremities: No cyanosis, clubbing with trace edema..  Peripheral pulses are palpable. Psych: Alert, awake and oriented, normal mood CNS:  No cranial nerve deficits.  Power equal in all extremities.   Skin: Warm and dry.  No rashes noted.  Data Reviewed:   CBC: Recent Labs  Lab 08/06/21 0720 08/07/21 1030 08/09/21 1428 08/11/21 0136  WBC 7.9 9.7 6.9 8.4  HGB 9.0* 9.7* 8.4* 7.8*  HCT 26.1* 28.3* 25.8* 24.1*  MCV 90.9 90.4 94.5 93.8  PLT 199 123* 159 168     Basic Metabolic Panel: Recent Labs  Lab 08/09/21 0131 08/09/21 1428 08/10/21 0137 08/11/21 0136 08/12/21 0130  NA 138 141 139 140 139  K 3.5 3.8 3.7 3.5 3.7  CL 103 103 105 106 104  CO2 23 25 22 25 25   GLUCOSE 97 89 88 97 98  BUN 34* 39* 33* 34* 32*  CREATININE 4.89* 4.87* 4.25* 3.92* 3.58*  CALCIUM 8.2* 8.4* 8.2* 8.3* 8.4*  MG  --   --   --  1.3* 1.4*  PHOS 4.7* 4.7* 4.4 4.0 3.6     Liver Function Tests: Recent Labs  Lab 08/09/21 0131 08/09/21 1428 08/10/21 0137 08/11/21 0136 08/12/21 0130  ALBUMIN 1.8* 1.8* 1.9* 1.7* 1.7*      Radiology Studies: No results found.    LOS: 21 days    Flora Lipps, MD [facility] Available via Epic secure chat 7am-7pm After these hours, please refer to coverage provider listed on [email or web address] 08/12/2021, 11:21 AM

## 2021-08-12 NOTE — Progress Notes (Signed)
[facility] Progress Note   Subjective:  only 52 UOP recorded but she says more- crt is trending down off of HD !!!!!  She feels like is getting stronger every day but does admit to some dizziness upon activity -   PTA was working   Objective Vitals:   08/11/21 1957 08/11/21 2300 08/12/21 0319 08/12/21 0751  BP: 103/67 (!) 101/59 101/61 (!) 113/58  Pulse: 98 80 85 86  Resp:  14 15 14   Temp: 98.2 F (36.8 C) 97.9 F (36.6 C) 97.7 F (36.5 C) 97.7 F (36.5 C)  TempSrc: Oral Oral Oral Oral  SpO2: 97% 96% 98% 100%  Height:       Physical Exam General:  NAD, laying flat in bed Heart: RRR Lungs: cta b/l Abdomen: soft Extremities: no sig edema -  legs are obese Dialysis Access:  RIJ [employer]  Additional Objective Labs: Basic Metabolic Panel: Recent Labs  Lab 08/10/21 0137 08/11/21 0136 08/12/21 0130  NA 139 140 139  K 3.7 3.5 3.7  CL 105 106 104  CO2 22 25 25   GLUCOSE 88 97 98  BUN 33* 34* 32*  CREATININE 4.25* 3.92* 3.58*  CALCIUM 8.2* 8.3* 8.4*  PHOS 4.4 4.0 3.6   Liver Function Tests: Recent Labs  Lab 08/10/21 0137 08/11/21 0136 08/12/21 0130  ALBUMIN 1.9* 1.7* 1.7*   No results for input(s): "LIPASE", "AMYLASE" in the last 168 hours.  CBC: Recent Labs  Lab 08/06/21 0720 08/07/21 1030 08/09/21 1428 08/11/21 0136  WBC 7.9 9.7 6.9 8.4  HGB 9.0* 9.7* 8.4* 7.8*  HCT 26.1* 28.3* 25.8* 24.1*  MCV 90.9 90.4 94.5 93.8  PLT 199 123* 159 168   Blood Culture    Component Value Date/Time   SDES BLOOD RIGHT ANTECUBITAL 07/21/2021 1915   SPECREQUEST  07/21/2021 1915    BOTTLES DRAWN AEROBIC ONLY Blood Culture results may not be optimal due to an inadequate volume of blood received in culture bottles   CULT  07/21/2021 1915    NO GROWTH 5 DAYS Performed at [facility] [address] [place], [place] [address]    REPTSTATUS 07/26/2021 FINAL 07/21/2021 1915    Cardiac Enzymes: No results for input(s): "CKTOTAL", "CKMB", "CKMBINDEX",  "TROPONINI" in the last 168 hours.  CBG: Recent Labs  Lab 08/11/21 0652 08/11/21 1056 08/11/21 1623 08/11/21 2140 08/12/21 0624  GLUCAP 96 121* 87 98 77   Iron Studies:  Recent Labs    08/11/21 0136  IRON 16*  TIBC 120*  FERRITIN 324*    @lablastinr3 @ Studies/Results: No results found. Medications:  sodium chloride 10 mL/hr at 07/27/21 0654   ferric gluconate (FERRLECIT) IVPB 250 mg (08/11/21 1022)    Chlorhexidine Gluconate Cloth  6 each Topical Daily   collagenase   Topical Daily   darbepoetin (ARANESP) injection - DIALYSIS  100 mcg Intravenous Q Thu-HD   feeding supplement (NEPRO CARB STEADY)  237 mL Oral BID BM   heparin injection (subcutaneous)  5,000 Units Subcutaneous Q8H   magnesium oxide  400 mg Oral BID   metoprolol tartrate  25 mg Oral BID   multivitamin  1 tablet Oral QHS   neomycin-bacitracin-polymyxin   Topical TID   pantoprazole  40 mg Oral Daily   polycarbophil  625 mg Oral BID   sodium chloride flush  10-40 mL Intracatheter Q12H   sodium chloride flush  3 mL Intravenous Q12H    Assessment/Plan: 1.  Acute kidney injury chronic kidney disease stage IIIa: Appears  consistent with ATN of septic shock.  Initiated HD 7/10 for worsening azotemia/metabolic acidosis.  Labs did indicate ongoing dialysis needs-  however,  did not get dialysis 7/25 and numbers improved-  improved moreso today again.  Have not  been great at capturing urine volume.   IR placed RIJ temp HD catheter 7/14, then [facility] placement 7/20 w/ IR - had been doing HD on a TTS schedule - last was on 7/22- now nonoliguric and showing signs of renal recovery-  BUN and crt down again today so will hold HD and follow -  has been set up at [facility] on MWF for AKI but maybe will not need ??  Keep TDC in for now just in case-  would like to see crt under 3 prior to anything like discharge-  likely will need to stay a couple of more days  2.  Incarcerated ventral hernia: Status post exploratory laparotomy with  small bowel resection, adhesiolysis and hernia repair with JP drain placement (removed 7/16).  Ongoing follow-up by surgery. 3. Hyperphosphatemia: OK off of a binder  4.   Anemia: Possibly chronic from malabsorption given prior history of small bowel resection.  Exacerbated by acute/critical illness and perioperative blood losses.  Iron sat 14, ferrritin 162; received total of 500mg  of ferrlecit, last dose 7/15.  Hb dropped to 6.8 on 7/21, received transfusion but drifting down again.  ESA start 7/20.  Needs more iron it appears, have ordered but lost IV so unable to give-  will give oral iron   Louis Meckel

## 2021-08-12 NOTE — Consult Note (Signed)
WOC Nurse Consult Note: Patient receiving care in [facility].  Reason for Consult: re-evaluation of abdominal pannus wound care Wound type: MASD-ITD, friction The wounds on the pannus are much improved.  The InterDry order had expired. I renewed it. The primary RN was asking for a minor modification to the wound care order, which was provided. No additional needs identified. Please re-consult the [facility] team if needed.  Val Riles, RN, MSN, CWOCN, CNS-BC, pager [phone]

## 2021-08-13 DIAGNOSIS — N179 Acute kidney failure, unspecified: Secondary | ICD-10-CM | POA: Diagnosis not present

## 2021-08-13 DIAGNOSIS — K436 Other and unspecified ventral hernia with obstruction, without gangrene: Secondary | ICD-10-CM | POA: Diagnosis not present

## 2021-08-13 DIAGNOSIS — I10 Essential (primary) hypertension: Secondary | ICD-10-CM | POA: Diagnosis not present

## 2021-08-13 DIAGNOSIS — Z6841 Body Mass Index (BMI) 40.0 and over, adult: Secondary | ICD-10-CM | POA: Diagnosis not present

## 2021-08-13 LAB — GLUCOSE, CAPILLARY
Glucose-Capillary: 100 mg/dL — ABNORMAL HIGH (ref 70–99)
Glucose-Capillary: 102 mg/dL — ABNORMAL HIGH (ref 70–99)

## 2021-08-13 LAB — RENAL FUNCTION PANEL
Albumin: 2 g/dL — ABNORMAL LOW (ref 3.5–5.0)
Anion gap: 11 (ref 5–15)
BUN: 31 mg/dL — ABNORMAL HIGH (ref 8–23)
CO2: 26 mmol/L (ref 22–32)
Calcium: 8.9 mg/dL (ref 8.9–10.3)
Chloride: 105 mmol/L (ref 98–111)
Creatinine, Ser: 3.03 mg/dL — ABNORMAL HIGH (ref 0.44–1.00)
GFR, Estimated: 16 mL/min — ABNORMAL LOW (ref >60)
Glucose, Bld: 93 mg/dL (ref 70–99)
Phosphorus: 3.1 mg/dL (ref 2.5–4.6)
Potassium: 3.8 mmol/L (ref 3.5–5.1)
Sodium: 142 mmol/L (ref 135–145)

## 2021-08-13 LAB — CBC
HCT: 28.6 % — ABNORMAL LOW (ref 36.0–46.0)
Hemoglobin: 9.1 g/dL — ABNORMAL LOW (ref 12.0–15.0)
MCH: 30.2 pg (ref 26.0–34.0)
MCHC: 31.8 g/dL (ref 30.0–36.0)
MCV: 95 fL (ref 80.0–100.0)
Platelets: 253 10*3/uL (ref 150–400)
RBC: 3.01 MIL/uL — ABNORMAL LOW (ref 3.87–5.11)
RDW: 17.9 % — ABNORMAL HIGH (ref 11.5–15.5)
WBC: 7.3 10*3/uL (ref 4.0–10.5)
nRBC: 0 % (ref 0.0–0.2)

## 2021-08-13 LAB — MAGNESIUM: Magnesium: 1.5 mg/dL — ABNORMAL LOW (ref 1.7–2.4)

## 2021-08-13 NOTE — Progress Notes (Signed)
Patient's incisional wound dressing change was performed per MD order without difficulty.  New InterDry was placed below pannus per MD order and patient and family educated.

## 2021-08-13 NOTE — Progress Notes (Signed)
[facility] Progress Note   Subjective:  no UOP recorded but she says some- crt is trending down off of HD still -  She feels like is getting stronger every day  -   PTA was working   Objective Vitals:   08/12/21 1953 08/12/21 2332 08/13/21 0456 08/13/21 0753  BP: 107/61 113/63 113/69 130/69  Pulse: 93 81 85 92  Resp: 15 16 15 16   Temp: 98.1 F (36.7 C) 98 F (36.7 C) 98.4 F (36.9 C) 98.3 F (36.8 C)  TempSrc: Oral Oral Oral Oral  SpO2: 97% 98% 98% 96%  Height:       Physical Exam General:  NAD, laying flat in bed Heart: RRR Lungs: cta b/l Abdomen: soft Extremities: no sig edema -  legs are obese Dialysis Access:  [facility]  Additional Objective Labs: Basic Metabolic Panel: Recent Labs  Lab 08/11/21 0136 08/12/21 0130 08/13/21 0155  NA 140 139 142  K 3.5 3.7 3.8  CL 106 104 105  CO2 25 25 26   GLUCOSE 97 98 93  BUN 34* 32* 31*  CREATININE 3.92* 3.58* 3.03*  CALCIUM 8.3* 8.4* 8.9  PHOS 4.0 3.6 3.1   Liver Function Tests: Recent Labs  Lab 08/11/21 0136 08/12/21 0130 08/13/21 0155  ALBUMIN 1.7* 1.7* 2.0*   No results for input(s): "LIPASE", "AMYLASE" in the last 168 hours.  CBC: Recent Labs  Lab 08/07/21 1030 08/09/21 1428 08/11/21 0136 08/13/21 0155  WBC 9.7 6.9 8.4 7.3  HGB 9.7* 8.4* 7.8* 9.1*  HCT 28.3* 25.8* 24.1* 28.6*  MCV 90.4 94.5 93.8 95.0  PLT 123* 159 168 253   Blood Culture    Component Value Date/Time   SDES BLOOD RIGHT ANTECUBITAL 07/21/2021 1915   SPECREQUEST  07/21/2021 1915    BOTTLES DRAWN AEROBIC ONLY Blood Culture results may not be optimal due to an inadequate volume of blood received in culture bottles   CULT  07/21/2021 1915    NO GROWTH 5 DAYS Performed at [facility] [address], [place] [address]    REPTSTATUS 07/26/2021 FINAL 07/21/2021 1915    Cardiac Enzymes: No results for input(s): "CKTOTAL", "CKMB", "CKMBINDEX", "TROPONINI" in the last 168 hours.  CBG: Recent Labs   Lab 08/12/21 0624 08/12/21 1207 08/12/21 1619 08/12/21 2112 08/13/21 0610  GLUCAP 77 85 81 91 100*   Iron Studies:  Recent Labs    08/11/21 0136  IRON 16*  TIBC 120*  FERRITIN 324*    @lablastinr3 @ Studies/Results: No results found. Medications:  sodium chloride 10 mL/hr at 07/27/21 0654   ferric gluconate (FERRLECIT) IVPB 250 mg (08/11/21 1022)    Chlorhexidine Gluconate Cloth  6 each Topical Daily   collagenase   Topical Daily   darbepoetin (ARANESP) injection - DIALYSIS  100 mcg Subcutaneous Q Fri   feeding supplement (NEPRO CARB STEADY)  237 mL Oral BID BM   heparin injection (subcutaneous)  5,000 Units Subcutaneous Q8H   iron polysaccharides  150 mg Oral Daily   magnesium oxide  400 mg Oral BID   metoprolol tartrate  25 mg Oral BID   multivitamin  1 tablet Oral QHS   neomycin-bacitracin-polymyxin   Topical TID   nutrition supplement (JUVEN)  1 packet Oral BID BM   pantoprazole  40 mg Oral Daily   polycarbophil  625 mg Oral BID   polyethylene glycol  17 g Oral Daily    Assessment/Plan: 1.  Acute kidney injury chronic kidney disease stage IIIa: Appears  consistent with ATN of septic shock.  Initiated HD 7/10 for worsening azotemia/metabolic acidosis.  Labs did indicate ongoing dialysis needs-  however,  did not get dialysis 7/25 and numbers improved and have continued   [facility] placement 7/20 w/ IR - had been doing HD on a TTS schedule - last was on 7/22- now nonoliguric and showing signs of renal recovery-  BUN and crt down again today so will hold HD and follow -  has been set up at [facility] on MWF for AKI but will not need  Keep TDC in for now just in case-  would like to see crt under 3 prior to anything like discharge-  we are close-  not sure if can have [place] removed on weekend but if crt down again tomorrow will order  2.  Incarcerated ventral hernia: Status post exploratory laparotomy with small bowel resection, adhesiolysis and hernia repair with JP drain placement  (removed 7/16).  Ongoing follow-up by surgery. 3. Hyperphosphatemia: OK off of a binder  4.   Anemia: Possibly chronic from malabsorption given prior history of small bowel resection.  Exacerbated by acute/critical illness and perioperative blood losses.  Iron sat 14, ferrritin 162; received total of 500mg  of ferrlecit, last dose 7/15.  Hb dropped to 6.8 on 7/21, received transfusion but drifting down again.  ESA start 7/20.  Needs more iron it appears, have ordered but lost IV so unable to give-  give oral iron -  hgb up to over [place]

## 2021-08-13 NOTE — Progress Notes (Signed)
Mobility Specialist: Progress Note   08/13/21 1501  Mobility  Activity Ambulated with assistance in hallway  Level of Assistance Contact guard assist, steadying assist  Assistive Device Front wheel walker  Distance Ambulated (ft) 100 ft (50'x2)  Activity Response Tolerated well  $Mobility charge 1 Mobility   Pt received in the BR and agreeable to mobility once finished. Stopped x1 for seated break secondary to general fatigue. Pt to BR after session per request then back to bed. Pt has call bell and phone at her side.   [facility] Kielan Dreisbach Mobility Specialist Mobility Specialist [address]: [phone]

## 2021-08-13 NOTE — Progress Notes (Addendum)
PROGRESS NOTE    [name]  GYI:[id number] DOB: 1955/02/12 DOA: 07/21/2021 PCP: Mancel Bale, PA-C    Brief Narrative:   66 year old female with history of hypertension, CKD stage IIIa,hyperlipidemia, chronically incarcerated large abdominal wall hernia which was repaired multiple times, recent SBO resection with mesh placement who presented to the ER with severe abdominal pain, vomiting.  CT abdomen/pelvis showed large SBO, large lower abdominal ventral hernia containing much of the small bowel.  General surgery was consulted and patient was taken to the OR for emergent exploratory laparotomy and hernia repair,she was then transferred to ICU.  Patient was hemodynamically stable and patient transferred to [facility] service on 07/28/21.    At this time, patient is on on dialysis for persistent AKI, nephrology following. Patient underwent IR placed right IJ temp HD catheter on 07/29/21, which was changed to right IJ [facility] catheter on 08/04/2021. Nephrology is following and at this time, hemodialysis on hold due to improvement in renal function.  Nephrology is deciding whether patient can be discontinued on hemodialysis..  PT recommending skilled nursing facility on discharge.    Assessment and Plan:  Principal Problem:   Ventral hernia with bowel obstruction Active Problems:   SIRS (systemic inflammatory response syndrome) (HCC)   Lactic acidosis   Leukocytosis   Acute renal failure superimposed on stage 3b chronic kidney disease (HCC)   Transient hypotension   Hypokalemia   History of small bowel obstruction   Body mass index 40.0-44.9, adult ([place])   Strangulated recurrent ventral incisional hernia, s/p small intestine resection   Essential hypertension   H/O exploratory laparotomy   Septic shock (HCC)   Ventilator dependence ([place])   Protein-calorie malnutrition, severe   Hypomagnesemia  Incarcerated/strangulated ventral hernia with peritonitis.  -Status post small bowel resection,  lysis of additions, repair of hernia and JP drain placement.  JP drain was removed on 07/31/2021.  Has completed antibiotic course.  General surgery has signed off at this time.  Continue dysphagia III diet.  Continue wound care dressing.   AKI on CKD stage 3a Secondary to ATN from septic shock secondary to peritonitis.  Patient was initiated on hemodialysis on 07/25/2021.  Nephrology following.  Patient unlikely to need long-term hemodialysis but nephrology wishes to see creatinine trend less than 3.  Creatinine today at 3.05  Hypomagnesemia.  Magnesium of 1.5 today.  Continue magnesium oxide   Normocytic anemia:  -Likely associated with critical illness, chronic kidney disease, postop blood loss.  Status post 1 unit of packed RBC transfusion.  Latest hemoglobin of 9.1.  Intermittent sinus tachycardia - Unclear etiology.  Echo showed EF of more than 75%, no regional wall motion abnormality, grade 1 diastolic dysfunction, no valvular abnormality.  TSH was slightly elevated.  Continue metoprolol twice daily.  Heart rate controlled at this time.   History of hypertension Amlodipine on hold.  Currently on metoprolol.  Blood pressure seems to be stable.   Acute hypoxic respiratory failure Resolved.  Status post surgery, patient was temporarily on a ventilator.   Currently on room air   Obesity - BMI of 37.1.  Would benefit from weight loss as outpatient    Debility/deconditioning - Patient seen by PT and has recommended skilled nursing facility on discharge.TOC following     DVT prophylaxis: heparin injection 5,000 Units Start: 08/04/21 1600   Code Status:     Code Status: Full Code  Disposition: Skilled nursing facility as per PT evaluation.    Status is: Inpatient  Remains inpatient appropriate  because: Assessment for long-term hemodialysis, skilled nursing facility placement   Family Communication:  None at bedside  Consultants:  General surgery Nephrology  Procedures:    Right internal jugular temporary hemodialysis catheter placement on 07/29/21 and permacath placement on 08/04/2021 Status post small bowel resection, lysis of additions, repair of hernia and JP drain placement.  PRBC transfusion 1 unit Hemodialysis. Intubation, mechanical ventilation and extubation  Antimicrobials:  None currently  Anti-infectives (From admission, onward)    Start     Dose/Rate Route Frequency Ordered Stop   08/04/21 0845  ceFAZolin (ANCEF) IVPB 2g/100 mL premix        over 30 Minutes Intravenous Continuous PRN 08/04/21 0850 08/04/21 0845   08/04/21 0841  ceFAZolin (ANCEF) 2-4 GM/100ML-% IVPB       Note to Pharmacy: Peggyann Shoals: cabinet override      08/04/21 0841 08/04/21 0850   07/23/21 1730  piperacillin-tazobactam (ZOSYN) IVPB 2.25 g        2.25 g 100 mL/hr over 30 Minutes Intravenous Every 8 hours 07/23/21 1207 07/28/21 1019   07/22/21 0900  piperacillin-tazobactam (ZOSYN) IVPB 2.25 g  Status:  Discontinued        2.25 g 100 mL/hr over 30 Minutes Intravenous Every 8 hours 07/22/21 0833 07/23/21 1207   07/22/21 0830  piperacillin-tazobactam (ZOSYN) IVPB 3.375 g  Status:  Discontinued        3.375 g 100 mL/hr over 30 Minutes Intravenous Every 8 hours 07/22/21 0827 07/22/21 0831   07/22/21 0000  piperacillin-tazobactam (ZOSYN) IVPB 3.375 g        3.375 g 100 mL/hr over 30 Minutes Intravenous  Once 07/21/21 2352 07/22/21 0142      Subjective: Today, patient was seen and examined at bedside.  Denies any nausea vomiting fever chills or rigor.  No shortness of breath dyspnea or chest pain.  Objective: Vitals:   08/12/21 1953 08/12/21 2332 08/13/21 0456 08/13/21 0753  BP: 107/61 113/63 113/69 130/69  Pulse: 93 81 85 92  Resp: 15 16 15 16   Temp: 98.1 F (36.7 C) 98 F (36.7 C) 98.4 F (36.9 C) 98.3 F (36.8 C)  TempSrc: Oral Oral Oral Oral  SpO2: 97% 98% 98% 96%  Height:        Intake/Output Summary (Last 24 hours) at 08/13/2021 1407 Last  data filed at 08/13/2021 1300 Gross per 24 hour  Intake 480 ml  Output 3 ml  Net 477 ml    Filed Weights    Physical Examination: Body mass index is 34.12 kg/m.   General: Obese built, not in obvious distress HENT:   No scleral pallor or icterus noted. Oral mucosa is moist.  Chest:   Diminished breath sounds bilaterally. No crackles or wheezes.  Right chest wall hemodialysis catheter in place. CVS: S1 &S2 heard. No murmur.  Regular rate and rhythm. Abdomen: Soft, nontender, nondistended.  Bowel sounds are heard.   Extremities: No cyanosis, clubbing or edema.  Peripheral pulses are palpable. Psych: Alert, awake and oriented, normal mood CNS:  No cranial nerve deficits.  Power equal in all extremities.   Skin: Warm and dry.  No rashes noted.   Data Reviewed:   CBC: Recent Labs  Lab 08/07/21 1030 08/09/21 1428 08/11/21 0136 08/13/21 0155  WBC 9.7 6.9 8.4 7.3  HGB 9.7* 8.4* 7.8* 9.1*  HCT 28.3* 25.8* 24.1* 28.6*  MCV 90.4 94.5 93.8 95.0  PLT 123* 159 168 253     Basic Metabolic Panel: Recent Labs  Lab  08/09/21 1428 08/10/21 0137 08/11/21 0136 08/12/21 0130 08/13/21 0155  NA 141 139 140 139 142  K 3.8 3.7 3.5 3.7 3.8  CL 103 105 106 104 105  CO2 25 22 25 25 26   GLUCOSE 89 88 97 98 93  BUN 39* 33* 34* 32* 31*  CREATININE 4.87* 4.25* 3.92* 3.58* 3.03*  CALCIUM 8.4* 8.2* 8.3* 8.4* 8.9  MG  --   --  1.3* 1.4* 1.5*  PHOS 4.7* 4.4 4.0 3.6 3.1     Liver Function Tests: Recent Labs  Lab 08/09/21 1428 08/10/21 0137 08/11/21 0136 08/12/21 0130 08/13/21 0155  ALBUMIN 1.8* 1.9* 1.7* 1.7* 2.0*      Radiology Studies: No results found.    LOS: 22 days    Flora Lipps, MD [facility] Available via Epic secure chat 7am-7pm After these hours, please refer to coverage provider listed on [email or web address] 08/13/2021, 2:07 PM

## 2021-08-14 DIAGNOSIS — K436 Other and unspecified ventral hernia with obstruction, without gangrene: Secondary | ICD-10-CM | POA: Diagnosis not present

## 2021-08-14 LAB — RENAL FUNCTION PANEL
Albumin: 1.9 g/dL — ABNORMAL LOW (ref 3.5–5.0)
Anion gap: 12 (ref 5–15)
BUN: 34 mg/dL — ABNORMAL HIGH (ref 8–23)
CO2: 26 mmol/L (ref 22–32)
Calcium: 8.9 mg/dL (ref 8.9–10.3)
Chloride: 104 mmol/L (ref 98–111)
Creatinine, Ser: 2.8 mg/dL — ABNORMAL HIGH (ref 0.44–1.00)
GFR, Estimated: 18 mL/min — ABNORMAL LOW (ref >60)
Glucose, Bld: 93 mg/dL (ref 70–99)
Phosphorus: 2.6 mg/dL (ref 2.5–4.6)
Potassium: 4 mmol/L (ref 3.5–5.1)
Sodium: 142 mmol/L (ref 135–145)

## 2021-08-14 LAB — MAGNESIUM: Magnesium: 1.7 mg/dL (ref 1.7–2.4)

## 2021-08-14 LAB — GLUCOSE, CAPILLARY
Glucose-Capillary: 81 mg/dL (ref 70–99)
Glucose-Capillary: 99 mg/dL (ref 70–99)
Glucose-Capillary: 127 mg/dL — ABNORMAL HIGH (ref 70–99)

## 2021-08-14 NOTE — Progress Notes (Signed)
Mobility Specialist: Progress Note   08/14/21 1207  Mobility  Activity Ambulated with assistance in hallway  Level of Assistance Contact guard assist, steadying assist  Assistive Device Front wheel walker  Distance Ambulated (ft) 140 ft  Activity Response Tolerated well  $Mobility charge 1 Mobility   Post-Mobility: 112 HR  Pt received in the BR and agreeable to mobility once finished. Stopped x1 for standing break secondary to BUE fatigue and mild SOB. Pt to BR after session and then sitting EOB per request. Call bell is in reach and family present in the room.   [facility] Ron Beske Mobility Specialist Mobility Specialist [address]: [phone]

## 2021-08-14 NOTE — Progress Notes (Signed)
PROGRESS NOTE    [name]  XBM:[id number] DOB: Dec 15, 1955 DOA: 07/21/2021 PCP: Mancel Bale, PA-C   Brief Narrative:  66 year old female with history of hypertension, CKD stage IIIa,hyperlipidemia, chronically incarcerated large abdominal wall hernia which was repaired multiple times, recent SBO resection with mesh placement who presented to the ER with severe abdominal pain, vomiting.  CT abdomen/pelvis showed large SBO, large lower abdominal ventral hernia containing much of the small bowel.  General surgery was consulted and patient was taken to the OR for emergent exploratory laparotomy and hernia repair,she was then transferred to ICU.  Patient was hemodynamically stable and patient transferred to [facility] service on 07/28/21.    At this time, patient is on on dialysis for persistent AKI, nephrology following. Patient underwent IR placed right IJ temp HD catheter on 07/29/21, which was changed to right IJ [facility] catheter on 08/04/2021. Nephrology is following and at this time, hemodialysis on hold due to improvement in renal function.  Nephrology is deciding whether patient can be discontinued on hemodialysis..  PT recommending skilled nursing facility on discharge.    Assessment & Plan:   Principal Problem:   Ventral hernia with bowel obstruction Active Problems:   SIRS (systemic inflammatory response syndrome) (HCC)   Lactic acidosis   Leukocytosis   Acute renal failure superimposed on stage 3b chronic kidney disease (HCC)   Transient hypotension   Hypokalemia   History of small bowel obstruction   Body mass index 40.0-44.9, adult (HCC)   Strangulated recurrent ventral incisional hernia, s/p small intestine resection   Essential hypertension   H/O exploratory laparotomy   Septic shock (HCC)   Ventilator dependence ([place])   Protein-calorie malnutrition, severe   Hypomagnesemia   Incarcerated/strangulated ventral hernia with peritonitis.  -Status post small bowel resection, lysis  of additions, repair of hernia and JP drain placement.  JP drain was removed on 07/31/2021.  Has completed antibiotic course.  General surgery has signed off at this time.  Continue dysphagia III diet.  Continue wound care dressing per surgical recommendations.   AKI on CKD stage 3a requiring transient HD, resolving Secondary to ATN from septic shock secondary to peritonitis.  Patient was initiated on hemodialysis on 07/25/2021.  Nephrology following.   Creatinine downtrending appropriately, urine output also improving as expected  Likely to discontinue temporary dialysis catheter in the next 24 to 48 hours per nephrology   Hypomagnesemia.   Resolved, continue to increase p.o. intake as appropriate   Chronic normocytic anemia likely of chronic disease:  In the setting of CKD status post 1 unit PRBC, stable No signs or symptoms of bleeding  Intermittent sinus tachycardia Likely stress related given above/due to acute illness, resolved Echo shows EF of 75% without wall motion abnormality, grade 1 diastolic dysfunction TSH minimally elevated Continue metoprolol   Hypertension, essential Continue metoprolol, amlodipine discontinued   Acute hypoxic respiratory failure Transient postoperatively, resolved Currently on room air   Obesity - BMI of 37.1.  Discussed dietary and lifestyle modifications at length   Ambulatory dysfunction, debility/deconditioning Patient evaluated by PT initially recommending SNF.  Patient requesting reevaluation in hopes for discharge home in the next 24 to 48 hours   DVT prophylaxis: Heparin subq Code Status: Full Family Communication: None present  Status is: Inpatient  Dispo: The patient is from: Home              Anticipated d/c is to: To be determined  Anticipated d/c date is: 24 to 48 hours              Patient currently not medically stable for discharge  Consultants:  PCCM, nephrology, general surgery  Procedures:  Incarcerated  hernia repair, temporary dialysis catheter placement, intubation/extubation as above  Antimicrobials:  None  Subjective: No acute issues or events overnight, denies nausea vomiting diarrhea constipation headache fevers chills or chest pain  Objective: Vitals:   08/13/21 1543 08/13/21 1951 08/14/21 0000 08/14/21 0410  BP: 124/69 (!) 121/56 105/65 112/63  Pulse: 89 88 80 94  Resp: 17 16 14 13   Temp: 97.6 F (36.4 C) 98.3 F (36.8 C) 98 F (36.7 C) 97.7 F (36.5 C)  TempSrc: Oral Oral Oral Oral  SpO2: 99% 100% 96% 99%  Height:        Intake/Output Summary (Last 24 hours) at 08/14/2021 0804 Last data filed at 08/14/2021 0412 Gross per 24 hour  Intake 480 ml  Output 2 ml  Net 478 ml   Filed Weights    Examination:  General:  Pleasantly resting in bed, No acute distress. HEENT:  Normocephalic atraumatic.  Sclerae nonicteric, noninjected.  Extraocular movements intact bilaterally. Neck:  Without mass or deformity.  Trachea is midline. Lungs:  Clear to auscultate bilaterally without rhonchi, wheeze, or rales. Heart:  Regular rate and rhythm.  Without murmurs, rubs, or gallops. Abdomen:  Soft, nontender, nondistended.  Without guarding or rebound. Extremities: Without cyanosis, clubbing, edema, or obvious deformity. Vascular: Right IJ temporary dialysis catheter clean dry intact Skin:  Warm and dry, no erythema, no ulcerations.  Data Reviewed: I have personally reviewed following labs and imaging studies  CBC: Recent Labs  Lab 08/07/21 1030 08/09/21 1428 08/11/21 0136 08/13/21 0155  WBC 9.7 6.9 8.4 7.3  HGB 9.7* 8.4* 7.8* 9.1*  HCT 28.3* 25.8* 24.1* 28.6*  MCV 90.4 94.5 93.8 95.0  PLT 123* 159 168 786   Basic Metabolic Panel: Recent Labs  Lab 08/10/21 0137 08/11/21 0136 08/12/21 0130 08/13/21 0155 08/14/21 0344  NA 139 140 139 142 142  K 3.7 3.5 3.7 3.8 4.0  CL 105 106 104 105 104  CO2 22 25 25 26 26   GLUCOSE 88 97 98 93 93  BUN 33* 34* 32* 31* 34*   CREATININE 4.25* 3.92* 3.58* 3.03* 2.80*  CALCIUM 8.2* 8.3* 8.4* 8.9 8.9  MG  --  1.3* 1.4* 1.5* 1.7  PHOS 4.4 4.0 3.6 3.1 2.6   GFR: Estimated Creatinine Clearance: 18.9 mL/min (A) (by C-G formula based on SCr of 2.8 mg/dL (H)). Liver Function Tests: Recent Labs  Lab 08/10/21 0137 08/11/21 0136 08/12/21 0130 08/13/21 0155 08/14/21 0344  ALBUMIN 1.9* 1.7* 1.7* 2.0* 1.9*   No results for input(s): "LIPASE", "AMYLASE" in the last 168 hours. No results for input(s): "AMMONIA" in the last 168 hours. Coagulation Profile: No results for input(s): "INR", "PROTIME" in the last 168 hours. Cardiac Enzymes: No results for input(s): "CKTOTAL", "CKMB", "CKMBINDEX", "TROPONINI" in the last 168 hours. BNP (last 3 results) No results for input(s): "PROBNP" in the last 8760 hours. HbA1C: No results for input(s): "HGBA1C" in the last 72 hours. CBG: Recent Labs  Lab 08/12/21 1207 08/12/21 1619 08/12/21 2112 08/13/21 0610 08/13/21 1829  GLUCAP 85 81 91 100* 102*   Lipid Profile: No results for input(s): "CHOL", "HDL", "LDLCALC", "TRIG", "CHOLHDL", "LDLDIRECT" in the last 72 hours. Thyroid Function Tests: No results for input(s): "TSH", "T4TOTAL", "FREET4", "T3FREE", "THYROIDAB" in the last 72 hours. Anemia Panel:  No results for input(s): "VITAMINB12", "FOLATE", "FERRITIN", "TIBC", "IRON", "RETICCTPCT" in the last 72 hours. Sepsis Labs: No results for input(s): "PROCALCITON", "LATICACIDVEN" in the last 168 hours.  No results found for this or any previous visit (from the past 240 hour(s)).       Radiology Studies: No results found.      Scheduled Meds:  Chlorhexidine Gluconate Cloth  6 each Topical Daily   collagenase   Topical Daily   darbepoetin (ARANESP) injection - DIALYSIS  100 mcg Subcutaneous Q Fri   feeding supplement (NEPRO CARB STEADY)  237 mL Oral BID BM   heparin injection (subcutaneous)  5,000 Units Subcutaneous Q8H   iron polysaccharides  150 mg Oral Daily    magnesium oxide  400 mg Oral BID   metoprolol tartrate  25 mg Oral BID   multivitamin  1 tablet Oral QHS   neomycin-bacitracin-polymyxin   Topical TID   nutrition supplement (JUVEN)  1 packet Oral BID BM   pantoprazole  40 mg Oral Daily   polycarbophil  625 mg Oral BID   polyethylene glycol  17 g Oral Daily   Continuous Infusions:  sodium chloride 10 mL/hr at 07/27/21 0654   ferric gluconate (FERRLECIT) IVPB 250 mg (08/11/21 1022)     LOS: 23 days    Time spent: 85min    [name] C Salwa Bai, DO [facility]  If 7PM-7AM, please contact night-coverage [email or web address]  08/14/2021, 8:04 AM

## 2021-08-14 NOTE — Progress Notes (Signed)
Incisional wound dressing changed per MD order without difficulty.

## 2021-08-14 NOTE — Progress Notes (Signed)
[facility] Progress Note   Subjective:  no UOP recorded but is making - crt is continuing trending down off of HD still -  She feels like is getting stronger every day  -   PTA was working   Objective Vitals:   08/13/21 1951 08/14/21 0000 08/14/21 0410 08/14/21 0810  BP: (!) 121/56 105/65 112/63 101/71  Pulse: 88 80 94 96  Resp: 16 14 13 16   Temp: 98.3 F (36.8 C) 98 F (36.7 C) 97.7 F (36.5 C) 97.7 F (36.5 C)  TempSrc: Oral Oral Oral Oral  SpO2: 100% 96% 99% 95%  Height:       Physical Exam General:  NAD, laying flat in bed Heart: RRR Lungs: cta b/l Abdomen: soft Extremities: no sig edema -  legs are obese Dialysis Access:  [facility]  Additional Objective Labs: Basic Metabolic Panel: Recent Labs  Lab 08/12/21 0130 08/13/21 0155 08/14/21 0344  NA 139 142 142  K 3.7 3.8 4.0  CL 104 105 104  CO2 25 26 26   GLUCOSE 98 93 93  BUN 32* 31* 34*  CREATININE 3.58* 3.03* 2.80*  CALCIUM 8.4* 8.9 8.9  PHOS 3.6 3.1 2.6   Liver Function Tests: Recent Labs  Lab 08/12/21 0130 08/13/21 0155 08/14/21 0344  ALBUMIN 1.7* 2.0* 1.9*   No results for input(s): "LIPASE", "AMYLASE" in the last 168 hours.  CBC: Recent Labs  Lab 08/07/21 1030 08/09/21 1428 08/11/21 0136 08/13/21 0155  WBC 9.7 6.9 8.4 7.3  HGB 9.7* 8.4* 7.8* 9.1*  HCT 28.3* 25.8* 24.1* 28.6*  MCV 90.4 94.5 93.8 95.0  PLT 123* 159 168 253   Blood Culture    Component Value Date/Time   SDES BLOOD RIGHT ANTECUBITAL 07/21/2021 1915   SPECREQUEST  07/21/2021 1915    BOTTLES DRAWN AEROBIC ONLY Blood Culture results may not be optimal due to an inadequate volume of blood received in culture bottles   CULT  07/21/2021 1915    NO GROWTH 5 DAYS Performed at [facility] [address], [place], [place] [address]    REPTSTATUS 07/26/2021 FINAL 07/21/2021 1915    Cardiac Enzymes: No results for input(s): "CKTOTAL", "CKMB", "CKMBINDEX", "TROPONINI" in the last 168  hours.  CBG: Recent Labs  Lab 08/12/21 1619 08/12/21 2112 08/13/21 0610 08/13/21 1829 08/14/21 0812  GLUCAP 81 91 100* 102* 81   Iron Studies:  No results for input(s): "IRON", "TIBC", "TRANSFERRIN", "FERRITIN" in the last 72 hours.   @lablastinr3 @ Studies/Results: No results found. Medications:  sodium chloride 10 mL/hr at 07/27/21 0654   ferric gluconate (FERRLECIT) IVPB 250 mg (08/11/21 1022)    Chlorhexidine Gluconate Cloth  6 each Topical Daily   collagenase   Topical Daily   darbepoetin (ARANESP) injection - DIALYSIS  100 mcg Subcutaneous Q Fri   feeding supplement (NEPRO CARB STEADY)  237 mL Oral BID BM   heparin injection (subcutaneous)  5,000 Units Subcutaneous Q8H   iron polysaccharides  150 mg Oral Daily   magnesium oxide  400 mg Oral BID   metoprolol tartrate  25 mg Oral BID   multivitamin  1 tablet Oral QHS   neomycin-bacitracin-polymyxin   Topical TID   nutrition supplement (JUVEN)  1 packet Oral BID BM   pantoprazole  40 mg Oral Daily   polycarbophil  625 mg Oral BID   polyethylene glycol  17 g Oral Daily    Assessment/Plan: 1.  Acute kidney injury chronic kidney disease stage IIIa:  ATN of  septic shock.  Initiated HD 7/10 for worsening azotemia/metabolic acidosis.  Labs did indicate ongoing dialysis needs-  however,  did not get dialysis 7/25 and numbers improved and have continued   [facility] placement 7/20 w/ IR - had been doing HD on a TTS schedule - last was on 7/22- now nonoliguric and showing signs of renal recovery-  BUN and crt down again today so will hold HD -  am confident she will not need further HD  can have TDC removed -  will place order.  Also renal will sign off call with questions  2.  Incarcerated ventral hernia: Status post exploratory laparotomy with small bowel resection, adhesiolysis and hernia repair with JP drain placement (removed 7/16).  Ongoing follow-up by surgery. 3. Hyperphosphatemia: OK off of a binder  4.   Anemia: Possibly  chronic from malabsorption given prior history of small bowel resection.  Exacerbated by acute/critical illness and perioperative blood losses.  Iron sat 14, ferrritin 162; received total of 500mg  of ferrlecit, last dose 7/15.  Hb dropped to 6.8 on 7/21, received transfusion but drifting down again.  ESA start 7/20.  Needs more iron it appears, have ordered but lost IV so unable to give-  give oral iron -  hgb up to over 9-  will not need to continue ESA as OP   Louis Meckel

## 2021-08-15 ENCOUNTER — Inpatient Hospital Stay (HOSPITAL_COMMUNITY): Payer: BC Managed Care – PPO

## 2021-08-15 HISTORY — PX: IR REMOVAL TUN CV CATH W/O FL: IMG2289

## 2021-08-15 LAB — RENAL FUNCTION PANEL
Albumin: 1.9 g/dL — ABNORMAL LOW (ref 3.5–5.0)
Anion gap: 10 (ref 5–15)
BUN: 33 mg/dL — ABNORMAL HIGH (ref 8–23)
CO2: 26 mmol/L (ref 22–32)
Calcium: 9.1 mg/dL (ref 8.9–10.3)
Chloride: 107 mmol/L (ref 98–111)
Creatinine, Ser: 2.72 mg/dL — ABNORMAL HIGH (ref 0.44–1.00)
GFR, Estimated: 19 mL/min — ABNORMAL LOW (ref >60)
Glucose, Bld: 100 mg/dL — ABNORMAL HIGH (ref 70–99)
Phosphorus: 2.2 mg/dL — ABNORMAL LOW (ref 2.5–4.6)
Potassium: 4 mmol/L (ref 3.5–5.1)
Sodium: 143 mmol/L (ref 135–145)

## 2021-08-15 LAB — GLUCOSE, CAPILLARY: Glucose-Capillary: 94 mg/dL (ref 70–99)

## 2021-08-15 MED ORDER — CHLORHEXIDINE GLUCONATE 4 % EX LIQD
CUTANEOUS | Status: AC
  Filled 2021-08-15: qty 15

## 2021-08-15 MED ORDER — LIDOCAINE HCL 1 % IJ SOLN
INTRAMUSCULAR | Status: AC
  Filled 2021-08-15: qty 20

## 2021-08-15 NOTE — Progress Notes (Signed)
Occupational Therapy Treatment Patient Details Name: [name] MRN: [id number] DOB: 02/12/1955 Today's Date: 08/15/2021   History of present illness 66 y.o. female admitted 07/21/21 with abdominal pain due to incarcerated ventral hernia and SBO. S/p emergent ex lap, small bowel resection, incarcerated ventral hernia repair with JP drain placement on 7/7. ETT 7/7-7/11. Course complicated by AKI on CKD 3; HD initiated 7/10. 7/20 Rt IJ TDC placed. PMHx:chronic incarcerated hernia s/p multiple repairs, SBO, HTN, HLD, CKD   OT comments  Goals updated to reflect discharge plan to home with [place] [facility] [place] rather than SNF (pt declining snf). Pt may benefit from use of AE to help with LB ADL tasks. Friend present during session who plans to assist with care after DC. Mobility is difficult due to pain from weight of pannus. When assessing possible use of sling to support pannus, noted, large open wound under pannus with 2 areas of yellow slough, in addition to apparent MASD.  Contacted [place] nurse to assess wound prior to attempting to position a sling to help with mobility. Will follow up at later time.   Recommendations for follow up therapy are one component of a multi-disciplinary discharge planning process, led by the attending physician.  Recommendations may be updated based on patient status, additional functional criteria and insurance authorization.    Follow Up Recommendations  Home health OT; [place] Recommended at Discharge Frequent or constant Supervision/Assistance  Patient can return home with the following  A little help with walking and/or transfers;A lot of help with bathing/dressing/bathroom;Assistance with cooking/housework;Assist for transportation;Help with stairs or ramp for entrance   Equipment Recommendations  BSC/3in1;Tub/shower seat;Wheelchair (measurements OT);Wheelchair cushion (measurements OT)    Recommendations for Other Services      Precautions /  Restrictions Precautions Precautions: Fall;Other (comment) Precaution Comments: watch HR; open abdominal wound and wound under pannus Restrictions Weight Bearing Restrictions: No       Mobility Bed Mobility   Bed Mobility: Sidelying to Sit Rolling: Min guard              Transfers       Sit to Stand: Min guard                 Balance                                           ADL either performed or assessed with clinical judgement   ADL       Grooming: Set up   Upper Body Bathing: Set up;Supervision/ safety;Sitting   Lower Body Bathing: Maximal assistance;Sit to/from stand Lower Body Bathing Details (indicate cue type and reason): may benefit form AE Upper Body Dressing : Set up   Lower Body Dressing: Maximal assistance;Sit to/from stand   Toilet Transfer: Minimal assistance   Toileting- Clothing Manipulation and Hygiene: Minimal assistance         General ADL Comments: Pannus is "painful" during mobility; discussed possible option of using a "sling" to support pannus. When lifitng pannus, there is a large open area with red granulating tissue in addition to areas of yellow slough. WOC contacted to assess use of possible  additional dressing if slough is new.    Extremity/Trunk Assessment Upper Extremity Assessment Upper Extremity Assessment: Generalized weakness   Lower Extremity Assessment Lower Extremity Assessment: Defer to PT evaluation  Vision       Perception     Praxis      Cognition Arousal/Alertness: Awake/alert Behavior During Therapy: Flat affect Overall Cognitive Status: Within Functional Limits for tasks assessed                                          Exercises      Shoulder Instructions       General Comments HR ranging from 119-160 bpm with activity.    Pertinent Vitals/ Pain       Pain Assessment Pain Assessment: Faces Faces Pain Scale: Hurts little more Pain  Location: abdomen Pain Descriptors / Indicators: Grimacing, Sore, Discomfort Pain Intervention(s): Limited activity within patient's tolerance  Home Living                                          Prior Functioning/Environment              Frequency  Min 2X/week        Progress Toward Goals  OT Goals(current goals can now be found in the care plan section)  Progress towards OT goals: Goals met and updated - see care plan  Acute Rehab OT Goals Patient Stated Goal: to go home with help OT Goal Formulation: With patient Time For Goal Achievement: 08/26/21 Potential to Achieve Goals: Good ADL Goals Pt Will Perform Grooming:  (goal met 7/31) Pt Will Perform Upper Body Bathing:  (goal met 7/31) Pt Will Perform Upper Body Dressing:  (goals met 7/31) Pt Will Perform Lower Body Dressing: with min assist;with adaptive equipment;sit to/from stand Pt Will Transfer to Toilet:  (goal met 7/31) Pt Will Perform Toileting - Clothing Manipulation and hygiene: with set-up;sit to/from stand Pt/caregiver will Perform Home Exercise Program:  (goal met) Additional ADL Goal #1: Pt will manage pannus pain and wound on pannus to increase independence with ADL with min A (possible use of sling) Additional ADL Goal #2: Pt will independently verbalzie 3 strategies to reduce risk of falls  Plan Discharge plan needs to be updated    Co-evaluation                 AM-PAC OT "6 Clicks" Daily Activity     Outcome Measure   Help from another person eating meals?: None Help from another person taking care of personal grooming?: None Help from another person toileting, which includes using toliet, bedpan, or urinal?: A Little Help from another person bathing (including washing, rinsing, drying)?: A Lot Help from another person to put on and taking off regular upper body clothing?: A Little Help from another person to put on and taking off regular lower body clothing?: A Lot 6  Click Score: 18    End of Session    OT Visit Diagnosis: Unsteadiness on feet (R26.81);Muscle weakness (generalized) (M62.81)   Activity Tolerance Patient tolerated treatment well   Patient Left in bed;with call bell/phone within reach;with family/visitor present   Nurse Communication Other (comment) (wound assessment)        Time: 7793-9030 OT Time Calculation (min): 23 min  Charges: OT General Charges $OT Visit: 1 Visit OT Treatments $Self Care/Home Management : 8-22 mins $Therapeutic Activity: 8-22 mins  Maurie Boettcher, OT/L   Acute OT Clinical Specialist Acute Rehabilitation Services Pager [phone]  Office [phone]   [facility] 08/15/2021, 12:34 PM

## 2021-08-15 NOTE — Progress Notes (Signed)
PROGRESS NOTE    [name]  DPO:[id number] DOB: 1955-02-07 DOA: 07/21/2021 PCP: Mancel Bale, PA-C   Brief Narrative:  66 year old female with history of hypertension, CKD stage IIIa,hyperlipidemia, chronically incarcerated large abdominal wall hernia which was repaired multiple times, recent SBO resection with mesh placement who presented to the ER with severe abdominal pain, vomiting.  CT abdomen/pelvis showed large SBO, large lower abdominal ventral hernia containing much of the small bowel.  General surgery was consulted and patient was taken to the OR for emergent exploratory laparotomy and hernia repair,she was then transferred to ICU.  Patient was hemodynamically stable and patient transferred to [facility] service on 07/28/21.    At this time, patient is on on dialysis for persistent AKI, nephrology following. Patient underwent IR placed right IJ temp HD catheter on 07/29/21, which was changed to right IJ [facility] catheter on 08/04/2021. Nephrology is following and at this time, hemodialysis on hold due to improvement in renal function.  Nephrology is deciding whether patient can be discontinued on hemodialysis..  PT recommending skilled nursing facility on discharge.    Assessment & Plan:   Principal Problem:   Ventral hernia with bowel obstruction Active Problems:   SIRS (systemic inflammatory response syndrome) (HCC)   Lactic acidosis   Leukocytosis   Acute renal failure superimposed on stage 3b chronic kidney disease (HCC)   Transient hypotension   Hypokalemia   History of small bowel obstruction   Body mass index 40.0-44.9, adult (HCC)   Strangulated recurrent ventral incisional hernia, s/p small intestine resection   Essential hypertension   H/O exploratory laparotomy   Septic shock (HCC)   Ventilator dependence ([place])   Protein-calorie malnutrition, severe   Hypomagnesemia   Incarcerated/strangulated ventral hernia with peritonitis.  -Status post small bowel resection, lysis  of additions, repair of hernia and JP drain placement.  JP drain was removed on 07/31/2021.  Has completed antibiotic course.  General surgery has signed off at this time.  Continue dysphagia III diet.  Continue wound care dressing per surgical recommendations.   AKI on CKD stage 3a requiring transient HD, resolving Secondary to ATN from septic shock secondary to peritonitis.  Patient was initiated on hemodialysis on 07/25/2021.  Nephrology following.   Creatinine downtrending appropriately, urine output also improving as expected  Temporary dialysis catheter removed - nephrology signed off   Hypomagnesemia.   Resolved, continue to increase p.o. intake as appropriate   Chronic normocytic anemia likely of chronic disease:  In the setting of CKD status post 1 unit PRBC, stable No signs or symptoms of bleeding  Intermittent sinus tachycardia Likely stress related given above/due to acute illness, resolved Echo shows EF of 75% without wall motion abnormality, grade 1 diastolic dysfunction TSH minimally elevated Continue metoprolol   Hypertension, essential Continue metoprolol, amlodipine discontinued   Acute hypoxic respiratory failure Transient postoperatively, resolved Currently on room air   Obesity - BMI of 37.1.  Discussed dietary and lifestyle modifications at length   Ambulatory dysfunction, debility/deconditioning Patient evaluated by PT initially recommending SNF - improving now - likely able to tolerate HHPT if safe disposition/wound care can be arranged.   DVT prophylaxis: Heparin subq Code Status: Full Family Communication: None present  Status is: Inpatient  Dispo: The patient is from: Home              Anticipated d/c is to: HHPT/OT              Anticipated d/c date is: 24 to 48 hours  Patient currently not medically stable for discharge  Consultants:  PCCM, nephrology, general surgery  Procedures:  Incarcerated hernia repair, temporary dialysis  catheter placement, intubation/extubation as above  Antimicrobials:  None  Subjective: No acute issues or events overnight, denies nausea vomiting diarrhea constipation headache fevers chills or chest pain  Objective: Vitals:   08/14/21 1559 08/14/21 1954 08/15/21 0033 08/15/21 0440  BP: 129/66 107/68 112/67 134/69  Pulse: 87 99 96 86  Resp: 17 14 12 14   Temp: 97.7 F (36.5 C) 97.9 F (36.6 C) 98 F (36.7 C) 98 F (36.7 C)  TempSrc: Oral Oral Oral Oral  SpO2: 100% 100% 100% 99%  Height:        Intake/Output Summary (Last 24 hours) at 08/15/2021 0801 Last data filed at 08/14/2021 1600 Gross per 24 hour  Intake --  Output 350 ml  Net -350 ml    Filed Weights    Examination:  General:  Pleasantly resting in bed, No acute distress. HEENT:  Normocephalic atraumatic.  Sclerae nonicteric, noninjected.  Extraocular movements intact bilaterally. Neck:  Without mass or deformity.  Trachea is midline. Lungs:  Clear to auscultate bilaterally without rhonchi, wheeze, or rales. Heart:  Regular rate and rhythm.  Without murmurs, rubs, or gallops. Abdomen:  Soft, nontender, nondistended.  Without guarding or rebound. Extremities: Without cyanosis, clubbing, edema, or obvious deformity. Vascular: Right IJ temporary dialysis catheter clean dry intact Skin:  Warm and dry, no erythema, no ulcerations.  Data Reviewed: I have personally reviewed following labs and imaging studies  CBC: Recent Labs  Lab 08/09/21 1428 08/11/21 0136 08/13/21 0155  WBC 6.9 8.4 7.3  HGB 8.4* 7.8* 9.1*  HCT 25.8* 24.1* 28.6*  MCV 94.5 93.8 95.0  PLT 159 168 671    Basic Metabolic Panel: Recent Labs  Lab 08/11/21 0136 08/12/21 0130 08/13/21 0155 08/14/21 0344 08/15/21 0605  NA 140 139 142 142 143  K 3.5 3.7 3.8 4.0 4.0  CL 106 104 105 104 107  CO2 25 25 26 26 26   GLUCOSE 97 98 93 93 100*  BUN 34* 32* 31* 34* 33*  CREATININE 3.92* 3.58* 3.03* 2.80* 2.72*  CALCIUM 8.3* 8.4* 8.9 8.9 9.1   MG 1.3* 1.4* 1.5* 1.7  --   PHOS 4.0 3.6 3.1 2.6 2.2*    GFR: Estimated Creatinine Clearance: 19.5 mL/min (A) (by C-G formula based on SCr of 2.72 mg/dL (H)). Liver Function Tests: Recent Labs  Lab 08/11/21 0136 08/12/21 0130 08/13/21 0155 08/14/21 0344 08/15/21 0605  ALBUMIN 1.7* 1.7* 2.0* 1.9* 1.9*    No results for input(s): "LIPASE", "AMYLASE" in the last 168 hours. No results for input(s): "AMMONIA" in the last 168 hours. Coagulation Profile: No results for input(s): "INR", "PROTIME" in the last 168 hours. Cardiac Enzymes: No results for input(s): "CKTOTAL", "CKMB", "CKMBINDEX", "TROPONINI" in the last 168 hours. BNP (last 3 results) No results for input(s): "PROBNP" in the last 8760 hours. HbA1C: No results for input(s): "HGBA1C" in the last 72 hours. CBG: Recent Labs  Lab 08/13/21 0610 08/13/21 1829 08/14/21 0812 08/14/21 1116 08/14/21 1716  GLUCAP 100* 102* 81 99 127*    Lipid Profile: No results for input(s): "CHOL", "HDL", "LDLCALC", "TRIG", "CHOLHDL", "LDLDIRECT" in the last 72 hours. Thyroid Function Tests: No results for input(s): "TSH", "T4TOTAL", "FREET4", "T3FREE", "THYROIDAB" in the last 72 hours. Anemia Panel: No results for input(s): "VITAMINB12", "FOLATE", "FERRITIN", "TIBC", "IRON", "RETICCTPCT" in the last 72 hours. Sepsis Labs: No results for input(s): "PROCALCITON", "LATICACIDVEN" in the  last 168 hours.  No results found for this or any previous visit (from the past 240 hour(s)).       Radiology Studies: No results found.      Scheduled Meds:  Chlorhexidine Gluconate Cloth  6 each Topical Daily   collagenase   Topical Daily   darbepoetin (ARANESP) injection - DIALYSIS  100 mcg Subcutaneous Q Fri   feeding supplement (NEPRO CARB STEADY)  237 mL Oral BID BM   heparin injection (subcutaneous)  5,000 Units Subcutaneous Q8H   iron polysaccharides  150 mg Oral Daily   magnesium oxide  400 mg Oral BID   metoprolol tartrate  25 mg  Oral BID   multivitamin  1 tablet Oral QHS   neomycin-bacitracin-polymyxin   Topical TID   nutrition supplement (JUVEN)  1 packet Oral BID BM   pantoprazole  40 mg Oral Daily   polycarbophil  625 mg Oral BID   polyethylene glycol  17 g Oral Daily   Continuous Infusions:  sodium chloride 10 mL/hr at 07/27/21 0654   ferric gluconate (FERRLECIT) IVPB Stopped (08/14/21 1200)     LOS: 24 days    Time spent: 36min    Mikele Sifuentes C Jakarie Pember, DO [facility]  If 7PM-7AM, please contact night-coverage [email or web address]  08/15/2021, 8:01 AM

## 2021-08-15 NOTE — Progress Notes (Signed)
Nephrology noted reviewed from 7/30. Will cancel out-pt HD clinic referral and navigator to sign off.  Melven Sartorius Renal Navigator [phone]

## 2021-08-15 NOTE — Progress Notes (Signed)
Occupational Therapy Treatment Note  Interdry placed under pannus. Sling fabricated to hold dressings intact and support pannus when mobilizing. Pt reports sling helps with support. Sister was present for majority of session for education. Recommend removing sling material when not mobilizing to allow for Interdry to absorb moisture. Strong odor noted from pannus area. Pt complained of being dizzy after mobilizing @ 10 ft. Returned to supine. BP 111/82 (supine); 104/80 (standing) and 119/70 (return to supine). Will follow up tomorrow. Encourage pt to mobilize with staff and mobility specialists.     08/15/21 1500  OT Visit Information  Last OT Received On 08/15/21  Assistance Needed +1  History of Present Illness 66 y.o. female admitted 07/21/21 with abdominal pain due to incarcerated ventral hernia and SBO. S/p emergent ex lap, small bowel resection, incarcerated ventral hernia repair with JP drain placement on 7/7. ETT 7/7-7/11. Course complicated by AKI on CKD 3; HD initiated 7/10. 7/20 Rt IJ TDC placed. PMHx:chronic incarcerated hernia s/p multiple repairs, SBO, HTN, HLD, CKD  Precautions  Precautions Fall;Other (comment)  Precaution Comments watch HR; open abdominal wound and wound under pannus  Pain Assessment  Pain Assessment Faces  Faces Pain Scale 4  Pain Location abdomen  Pain Descriptors / Indicators Grimacing;Sore;Discomfort  Pain Intervention(s) Limited activity within patient's tolerance;Repositioned  Cognition  Arousal/Alertness Awake/alert  Behavior During Therapy WFL for tasks assessed/performed  Overall Cognitive Status Within Functional Limits for tasks assessed  Upper Extremity Assessment  Upper Extremity Assessment Generalized weakness  ADL  Functional mobility during ADLs Min guard;Rolling walker (2 wheels)  General ADL Comments Sling fabricated to support pannus and hold together dressings; Interdry placed under pannus  Bed Mobility  Overal bed mobility Needs  Assistance  Bed Mobility Supine to Sit;Sit to Supine  Supine to sit Supervision  Sit to supine Supervision  Transfers  Overall transfer level Needs assistance  Transfers Sit to/from Stand  Sit to Stand Supervision  Balance  Sitting balance-Leahy Scale Good  Standing balance-Leahy Scale Fair  Standing balance comment able to release hands form RW  OT - End of Session  Equipment Utilized During Treatment Rolling walker (2 wheels)  Activity Tolerance Patient tolerated treatment well  Patient left in bed;with call bell/phone within reach  Nurse Communication Mobility status;Other (comment) (use of sling)  OT Assessment/Plan  OT Plan Discharge plan remains appropriate;Frequency needs to be updated  OT Visit Diagnosis Unsteadiness on feet (R26.81);Muscle weakness (generalized) (M62.81);Pain  OT Frequency (ACUTE ONLY) Min 3X/week  Follow Up Recommendations Home health OT ([place])  Assistance recommended at discharge Frequent or constant Supervision/Assistance  Patient can return home with the following A little help with walking and/or transfers;A lot of help with bathing/dressing/bathroom;Assistance with cooking/housework;Assist for transportation;Help with stairs or ramp for entrance  OT Equipment BSC/3in1;Tub/shower seat;Wheelchair (measurements OT);Wheelchair cushion (measurements OT)  AM-PAC OT "6 Clicks" Daily Activity Outcome Measure (Version 2)  Help from another person eating meals? 4  Help from another person taking care of personal grooming? 4  Help from another person toileting, which includes using toliet, bedpan, or urinal? 3  Help from another person bathing (including washing, rinsing, drying)? 2  Help from another person to put on and taking off regular upper body clothing? 3  Help from another person to put on and taking off regular lower body clothing? 2  6 Click Score 18  Progressive Mobility  What is the highest level of mobility based on the progressive mobility  assessment? Level 5 (Walks with assist in room/hall) -  Balance while stepping forward/back and can walk in room with assist - Complete  Activity Ambulated with assistance in room  OT Goal Progression  Progress towards OT goals Progressing toward goals  Acute Rehab OT Goals  Patient Stated Goal to get better and go home  OT Goal Formulation With patient  Time For Goal Achievement 08/26/21  Potential to Achieve Goals Good  ADL Goals  Pt Will Perform Grooming with supervision;sitting  Pt Will Perform Upper Body Bathing with min assist;sitting  Pt Will Perform Upper Body Dressing with min assist;sitting  Pt Will Transfer to Toilet with mod assist;stand pivot transfer;bedside commode  Pt/caregiver will Perform Home Exercise Program Increased ROM;Increased strength;Both right and left upper extremity;With Supervision  Pt Will Perform Lower Body Dressing with min assist;with adaptive equipment;sit to/from stand  Pt Will Perform Toileting - Clothing Manipulation and hygiene with set-up;sit to/from stand  Additional ADL Goal #1 Pt will manage pannus pain and wound on pannus to increase independence with ADL with min A (possible use of sling)  Additional ADL Goal #2 Pt will independently verbalzie 3 strategies to reduce risk of falls  OT Time Calculation  OT Start Time (ACUTE ONLY) 1450  OT Stop Time (ACUTE ONLY) 1531  OT Time Calculation (min) 41 min  OT General Charges  $OT Visit 1 Visit  OT Treatments  $Self Care/Home Management  38-52 mins  Maurie Boettcher, OT/L   Acute OT Clinical Specialist [place] Pager [phone] Office [phone]

## 2021-08-15 NOTE — Progress Notes (Signed)
Mobility Specialist Progress Note   08/15/21 1800  Mobility  Activity Ambulated with assistance in room  Level of Assistance Minimal assist, patient does 75% or more  Assistive Device Front wheel walker  Distance Ambulated (ft) 56 ft (28 +28)  Activity Response Tolerated well  $Mobility charge 1 Mobility   Pre Mobility: 92 HR, 119/72 BP During Mobility: 120 HR Post Mobility: 99 HR, 137/87 BP  Session limited by RLQ pain today but pt agreeable. Received supine in bed and requiring minA to get EOB d/t trunk weakness. Pt requesting not to tie support sling around neck before ambulating, stating that it was irritating them. Slow antalgic like gait while ambulating in the room but no faults witnessed during session. X1 seated rest break d/t dec pain tolerance but pt able to do one more bout from bed > door > bed. Left supine in bed w/ call bell n reach and pain declining.  Holland Falling Mobility Specialist MS [facility] #:  [phone] Acute Rehab Office:  [phone]

## 2021-08-15 NOTE — Procedures (Signed)
Interventional Radiology Procedure Note  PROCEDURE SUMMARY:  Successful removal of tunneled catheter.  No complications.   EBL = trace  Please see full dictation in imaging section of Epic for procedure details.    Narda Rutherford, AGNP-BC 08/15/2021, 9:57 AM

## 2021-08-15 NOTE — Progress Notes (Addendum)
24 Days Post-Op  Subjective: CC: Doing well. No new events or complaints.   Objective: Vital signs in last 24 hours: Temp:  [97.5 F (36.4 C)-98 F (36.7 C)] 98 F (36.7 C) (07/31 0440) Pulse Rate:  [86-99] 86 (07/31 0440) Resp:  [12-17] 14 (07/31 0440) BP: (107-134)/(66-69) 134/69 (07/31 0440) SpO2:  [99 %-100 %] 99 % (07/31 0440) Last BM Date : 08/13/21  Intake/Output from previous day: 07/30 0701 - 07/31 0700 In: -  Out: 350 [Urine:350] Intake/Output this shift: No intake/output data recorded.  PE: Gen:  Alert, NAD, pleasant Abd: Soft, ND, NT, +BS. Incision/wound as noted below with some granulation tissue but mostly fibrinous tissue at the base. Appears to track at least 8cm superiorly. There is a small to moderate amt of foul smelling green/blue drainage on kerlix gauze.       Lab Results:  Recent Labs    08/13/21 0155  WBC 7.3  HGB 9.1*  HCT 28.6*  PLT 253   BMET Recent Labs    08/14/21 0344 08/15/21 0605  NA 142 143  K 4.0 4.0  CL 104 107  CO2 26 26  GLUCOSE 93 100*  BUN 34* 33*  CREATININE 2.80* 2.72*  CALCIUM 8.9 9.1   PT/INR No results for input(s): "LABPROT", "INR" in the last 72 hours. CMP     Component Value Date/Time   NA 143 08/15/2021 0605   NA 146 (H) 07/11/2017 1547   K 4.0 08/15/2021 0605   CL 107 08/15/2021 0605   CO2 26 08/15/2021 0605   GLUCOSE 100 (H) 08/15/2021 0605   BUN 33 (H) 08/15/2021 0605   BUN 14 07/11/2017 1547   CREATININE 2.72 (H) 08/15/2021 0605   CREATININE 1.04 05/06/2014 1538   CALCIUM 9.1 08/15/2021 0605   PROT 5.2 (L) 08/04/2021 0450   PROT 6.1 07/11/2017 1547   ALBUMIN 1.9 (L) 08/15/2021 0605   ALBUMIN 3.5 (L) 07/11/2017 1547   AST 17 08/04/2021 0450   ALT 16 08/04/2021 0450   ALKPHOS 42 08/04/2021 0450   BILITOT 0.8 08/04/2021 0450   BILITOT <0.2 07/11/2017 1547   GFRNONAA 19 (L) 08/15/2021 0605   GFRAA 50 (L) 09/13/2019 0755   Lipase     Component Value Date/Time   LIPASE 24  07/21/2021 2056    Studies/Results: No results found.  Anti-infectives: Anti-infectives (From admission, onward)    Start     Dose/Rate Route Frequency Ordered Stop   08/04/21 0845  ceFAZolin (ANCEF) IVPB 2g/100 mL premix        over 30 Minutes Intravenous Continuous PRN 08/04/21 0850 08/04/21 0845   08/04/21 0841  ceFAZolin (ANCEF) 2-4 GM/100ML-% IVPB       Note to Pharmacy: Peggyann Shoals: cabinet override      08/04/21 0841 08/04/21 0850   07/23/21 1730  piperacillin-tazobactam (ZOSYN) IVPB 2.25 g        2.25 g 100 mL/hr over 30 Minutes Intravenous Every 8 hours 07/23/21 1207 07/28/21 1019   07/22/21 0900  piperacillin-tazobactam (ZOSYN) IVPB 2.25 g  Status:  Discontinued        2.25 g 100 mL/hr over 30 Minutes Intravenous Every 8 hours 07/22/21 0833 07/23/21 1207   07/22/21 0830  piperacillin-tazobactam (ZOSYN) IVPB 3.375 g  Status:  Discontinued        3.375 g 100 mL/hr over 30 Minutes Intravenous Every 8 hours 07/22/21 0827 07/22/21 0831   07/22/21 0000  piperacillin-tazobactam (ZOSYN) IVPB 3.375 g  3.375 g 100 mL/hr over 30 Minutes Intravenous  Once 07/21/21 2352 07/22/21 0142        Assessment/Plan POD#23 s/p  exploratory laparotomy, small bowel resection, adhesiolysis x90 minutes, primary repair of incarcerated ventral hernia, JP drain placement 7/7 Dr. Bobbye Morton for incarcerated and strangulated ventral hernia - Drain removed at bedside 7/16 - Soft diet, bid fiber - Completed 5 days post-op abx - Encourage mobilization  - WOC saw regarding her skin excoriation etc. Please follow their wound care recommendations and monitor.  - concern for pseudomonas colonization but, because it is hard to tell what is fascia vs fat vs underlying bowel, will not order dakins and will continue twice daily moist-to-dry dressing changes. - Awaiting SNF vs home with HH  ID - zosyn 7/7>7/12 FEN - soft diet VTE - SQH Foley - not present   - below per TRH -   Septic shock -  off pressors 7/9 ARF - HD started 7/10, temp HD cath exchange 7/17. Nephrology following.  VDRF - extubated 7/11 Elevated troponin - cards has seen, suspect secondary to critical illness and not ACS HTN   LOS: 24 days    Jill Alexanders , [facility] 08/15/2021, 8:56 AM Please see Amion for pager number during day hours 7:00am-4:30pm

## 2021-08-15 NOTE — Consult Note (Signed)
[facility] Nurse wound follow up Patient receiving care in [facility] [id number] Requested to re-evaluate pannus wound by OT. Concern for worsening of the wound. Large open area in the right pannus fold. Yellow with slimy drainage surrounded by pink scar tissue from previous breakdown in this area. I placed a sheet of Aquacel Advantage over the wet yellow area and covered with an ABD pad. Taped in a few places where I could however it may fall out when the patient gets up. Had NS to order more Aquacel and ABD pads and tape left in the room. Continue InterDry Ag for the left side, if overlaps to the right side, place on top of the ABD pad. OT is working on a sling for the pannus.   WOC will not follow at this time but are available to the patient and medical team if needed.   Cathlean Marseilles Tamala Julian, MSN, RN, [place], Lysle Pearl, [facility] Wound Treatment Associate Pager [phone]

## 2021-08-15 NOTE — Discharge Summary (Signed)
Physician Discharge Summary  [name] FUX:[id number] DOB: Feb 26, 1955 DOA: 07/21/2021  PCP: Mancel Bale, PA-C  Admit date: 07/21/2021 Discharge date: 08/16/2021  Admitted From: Home Disposition: Home  Recommendations for Outpatient Follow-up:  Follow up with PCP in 1-2 weeks Follow-up with surgery and wound care as scheduled  Home Health: Home health PT nursing wound care Equipment/Devices: Walker, wheelchair, 3 and 1, shower chair  Discharge Condition: Stable CODE STATUS: Full Diet recommendation: Low-salt low-fat diet  Brief/Interim Summary: 66 year old female with history of hypertension, CKD stage IIIa,hyperlipidemia, chronically incarcerated large abdominal wall hernia which was repaired multiple times, recent SBO resection with mesh placement who presented to the ER with severe abdominal pain, vomiting.  CT abdomen/pelvis showed large SBO, large lower abdominal ventral hernia containing much of the small bowel.  General surgery was consulted and patient was taken to the OR for emergent exploratory laparotomy and hernia repair,she was then transferred to ICU.  Patient was hemodynamically stable and patient transferred to [facility] service on 07/28/21.    At this time, patient is on on dialysis for persistent AKI, nephrology following. Patient underwent IR placed right IJ temp HD catheter on 07/29/21, which was changed to right IJ [facility] catheter on 08/04/2021. Nephrology is following and at this time, hemodialysis on hold due to improvement in renal function.  Nephrology signed off given resolution of kidney function, temporary dialysis catheter removed and urine output improving, creatinine downtrending as expected.  Initial evaluation with PT recommending SNF placement due to profound weakness but now improving and otherwise stable and agreeable for discharge home with home health PT OT and wound care as documented.  Follow-up with PCP and general surgery as scheduled.  Discharge Diagnoses:   Principal Problem:   Ventral hernia with bowel obstruction Active Problems:   SIRS (systemic inflammatory response syndrome) (HCC)   Lactic acidosis   Leukocytosis   Acute renal failure superimposed on stage 3b chronic kidney disease (HCC)   Transient hypotension   Hypokalemia   History of small bowel obstruction   Body mass index 40.0-44.9, adult ([place])   Strangulated recurrent ventral incisional hernia, s/p small intestine resection   Essential hypertension   H/O exploratory laparotomy   Septic shock ([place])   Ventilator dependence ([place])   Protein-calorie malnutrition, severe   Hypomagnesemia    Discharge Instructions   Allergies as of 08/16/2021       Reactions   Lisinopril Cough        Medication List     STOP taking these medications    amLODipine 10 MG tablet Commonly known as: NORVASC       TAKE these medications    acetaminophen 325 MG tablet Commonly known as: TYLENOL Take 2 tablets (650 mg total) by mouth every 6 (six) hours as needed for mild pain or fever.   collagenase 250 UNIT/GM ointment Commonly known as: SANTYL Apply topically daily.   iron polysaccharides 150 MG capsule Commonly known as: NIFEREX Take 1 capsule (150 mg total) by mouth daily.   loperamide 2 MG capsule Commonly known as: IMODIUM Take 1 capsule (2 mg total) by mouth every 4 (four) hours as needed for diarrhea or loose stools.   magnesium oxide 400 (240 Mg) MG tablet Commonly known as: MAG-OX Take 1 tablet (400 mg total) by mouth 2 (two) times daily.   metoprolol tartrate 25 MG tablet Commonly known as: LOPRESSOR Take 1 tablet (25 mg total) by mouth 2 (two) times daily.   multivitamin Tabs tablet Take 1 tablet  by mouth at bedtime.   neomycin-bacitracin-polymyxin Oint Commonly known as: NEOSPORIN Apply 1 Application topically 3 (three) times daily.   pantoprazole 40 MG tablet Commonly known as: PROTONIX Take 1 tablet (40 mg total) by mouth daily.   traMADol 50  MG tablet Commonly known as: ULTRAM Take 1 tablet (50 mg total) by mouth every 6 (six) hours as needed for up to 3 days for moderate pain.               Durable Medical Equipment  (From admission, onward)           Start     Ordered   08/15/21 1605  For home use only DME 3 n 1  Once        08/15/21 1605   08/15/21 1605  For home use only DME 4 wheeled rolling walker with seat  Once       Question:  [name]  Answer:  Ambulatory dysfunction   08/15/21 1605   08/15/21 1605  For home use only DME Shower stool  Once        08/15/21 1605   08/15/21 1605  For home use only DME high strength lightweight manual wheelchair with seat cushion  Once       Comments: Patient suffers from ambulatory dysfunction which impairs their ability to perform daily activities like bathing, dressing, feeding, grooming, and toileting in the home.  A walker will not resolve  issue with performing activities of daily living. A wheelchair will allow patient to safely perform daily activities.Length of need Lifetime. (THEN ONE OF THESE TWO:) Patient requires a size of large/heavy duty which is not available in a standard or lightweight wheelchair and patient spends at least two hours per day in their chair. Accessories: elevating leg rests (ELRs), wheel locks, extensions and anti-tippers.   08/15/21 1605            Follow-up Information     [name], MD Follow up on 09/01/2021.   Specialty: Surgery Why: 9am, Arrive 30 minutes prior to your appointment time, Please bring your insurance card and photo ID Contact information: [address] [address] [place], [facility] Follow up.   Why: Rollator, 3n1, tub seat, wheelchair arranged - will deliver to room prior to discharge Contact information: [address] [place] [address] [phone]                 Allergies  Allergen Reactions   Lisinopril Cough    Consultations: General surgery, wound care, IR  Procedures/Studies: IR Removal Tun Cv Cath W/O FL  Result Date: 08/15/2021 INDICATION: Patient no longer in need of HD. Request to remove tunneled HD catheter. EXAM: REMOVAL OF TUNNELED HEMODIALYSIS CATHETER MEDICATIONS: None COMPLICATIONS: None immediate. PROCEDURE: Informed written consent was obtained from the patient following an explanation of the procedure, risks, benefits and alternatives to treatment. A time out was performed prior to the initiation of the procedure. Sterile technique was utilized including mask, sterile gloves, sterile drape, and hand hygiene. ChloraPrep was used to prep the patient's right neck, chest and existing catheter. The catheter was easily removed intact. Hemostasis was obtained with manual compression. A dressing was placed. The patient tolerated the procedure well without immediate post procedural complication. IMPRESSION: Successful removal of tunneled dialysis catheter. Electronically Signed   By: Jerilynn Mages.  Shick  M.D.   On: 08/15/2021 10:11   IR Fluoro Guide CV Line Right  Result Date: 08/04/2021 INDICATION: Poorly functioning temporary dialysis catheter. Request made for conversion of temporary dialysis catheter to a tunneled dialysis catheter for continuation of hemodialysis. EXAM: FLUOROSCOPIC GUIDED CONVERSION OF NON TUNNELED TO TUNNELED HEMODIALYSIS CATHETER COMPARISON:  Temporary dialysis catheter exchange-08/01/2021 Temporary hemodialysis catheter placement-07/30/2018 MEDICATIONS: None ANESTHESIA/SEDATION: Moderate (conscious) sedation was employed during this procedure as administered by the Interventional Radiology RN. A total of Versed 0.5 mg and Fentanyl 25 mcg was administered intravenously. Moderate Sedation Time: 12 minutes. The patient's level of consciousness and vital signs were monitored continuously by radiology nursing throughout the  procedure under my direct supervision. FLUOROSCOPY TIME:  54 seconds (3 mGy) COMPLICATIONS: None immediate. PROCEDURE: Informed written consent was obtained from the patient after a discussion of the risks, benefits, and alternatives to treatment. Questions regarding the procedure were encouraged and answered. The skin and external portion of the existing hemodialysis catheter was prepped with chlorhexidine in a sterile fashion, and a sterile drape was applied covering the operative field. Maximum barrier sterile technique with sterile gowns and gloves were used for the procedure. A timeout was performed prior to the initiation of the procedure. Preprocedural spot fluoroscopic image demonstrates abrupt kinking of the temporary dialysis catheter at the level of the venotomy site, likely the etiology of the dialysis catheter malfunction. A lumen of the non tunneled temporary dialysis catheter was cannulated with a stiff Glidewire and utilized for measurement purposes. Next, the stiff Glidewire was advanced to the level of the IVC. A 19 cm tip to cuff palindrome dialysis catheter was tunneled from a site along the anterior chest to the venotomy site. Under intermittent fluoroscopic guidance, the temporary dialysis catheter was exchanged for a peel-away sheath. The tunneled dialysis catheter was inserted into the peel-away sheath with tips ultimately terminating within the superior aspect of the right atrium. Final catheter positioning was confirmed and documented with a spot fluoroscopic image. The catheter aspirates and flushes normally. The catheter was flushed with appropriate volume heparin dwells. The catheter exit site was secured with a 0-Prolene retention sutures. The venotomy site was apposed with Dermabond and Steri-Strips. Both lumens were heparinized. A dressing was applied. The patient tolerated the procedure well without immediate post procedural complication. IMPRESSION: Successful fluoroscopic guided  conversion of a temporary to a permanent 19 cm tip to cuff tunneled hemodialysis catheter with tips terminating within the superior aspect of the right atrium. The new tunneled dialysis catheter is ready for immediate use. Electronically Signed   By: Sandi Mariscal M.D.   On: 08/04/2021 09:30   IR Fluoro Guide CV Line Right  Result Date: 08/01/2021 INDICATION: 66 year old woman status post temporary hemodialysis catheter placement via right internal jugular venous access on 07/29/2021 returns to IR given poorly functioning dialysis catheter. EXAM: Fluoroscopy guided exchange of right IJ temporary hemodialysis catheter MEDICATIONS: None ANESTHESIA/SEDATION: None FLUOROSCOPY: Radiation Exposure Index (as provided by the fluoroscopic device): 5 mGy Kerma COMPLICATIONS: None immediate. PROCEDURE: Informed written consent was obtained from the patient after a thorough discussion of the procedural risks, benefits and alternatives. All questions were addressed. Maximal Sterile Barrier Technique was utilized including caps, mask, sterile gowns, sterile gloves, sterile drape, hand hygiene and skin antiseptic. A timeout was performed prior to the initiation of the procedure. Patient positioned supine on the procedure table. The external segment of the existing 16 cm non tunneled hemodialysis catheter it is on the skin prepped and draped usual fashion.  Following local administration, the existing catheter was removed over 0.035 inch guidewire. New 20 cm catheter was inserted over the guidewire with tip positioned in the right atrium. All lumens aspirated and flushed well. The dialysis lumens were locked with heparin. Catheter secured to skin with suture Insertion site was covered with sterile dressing. IMPRESSION: Successful exchange of 16 cm temporary right IJ hemodialysis catheter for 20 cm temporary hemodialysis catheter. Tip located in the right atrium. Catheter is ready for use. Electronically Signed   By: Miachel Roux  M.D.   On: 08/01/2021 16:45   IR Fluoro Guide CV Line Right  Result Date: 07/29/2021 INDICATION: 66 year old female referred for temporary non tunneled hemodialysis catheter EXAM: IMAGE GUIDED PLACEMENT OF NON TUNNELED HEMODIALYSIS CATHETER MEDICATIONS: None ANESTHESIA/SEDATION: None FLUOROSCOPY: Radiation Exposure Index (as provided by the fluoroscopic device): 12 seconds, 5 mGy Kerma COMPLICATIONS: None PROCEDURE: Informed written consent was obtained from the patient after a discussion of the risks, benefits, and alternatives to treatment. Questions regarding the procedure were encouraged and answered. The right neck was prepped with chlorhexidine in a sterile fashion, and a sterile drape was applied covering the operative field. Maximum barrier sterile technique with sterile gowns and gloves were used for the procedure. A timeout was performed prior to the initiation of the procedure. A micropuncture kit was utilized to access the right internal jugular vein under direct, real-time ultrasound guidance after the overlying soft tissues were anesthetized with 1% lidocaine with epinephrine. Ultrasound image documentation was performed. The microwire was kinked to measure appropriate catheter length. A stiff wire was advanced to the level of the IVC. A 16 cm hemodialysis catheter was then placed over the wire. Final catheter positioning was confirmed and documented with a spot radiographic image. The catheter aspirates and flushes normally. The catheter was flushed with appropriate volume heparin dwells. Dressings were applied. The patient tolerated the procedure well without immediate post procedural complication. IMPRESSION: Status post right IJ non tunneled triple-lumen hemodialysis catheter. Signed, Dulcy Fanny. Nadene Rubins, RPVI [facility] Radiology Electronically Signed   By: Corrie Mckusick D.O.   On: 07/29/2021 17:08   IR US Guide Vasc Access  Right  Result Date: 07/29/2021 INDICATION: 66 year old female referred for temporary non tunneled hemodialysis catheter EXAM: IMAGE GUIDED PLACEMENT OF NON TUNNELED HEMODIALYSIS CATHETER MEDICATIONS: None ANESTHESIA/SEDATION: None FLUOROSCOPY: Radiation Exposure Index (as provided by the fluoroscopic device): 12 seconds, 5 mGy Kerma COMPLICATIONS: None PROCEDURE: Informed written consent was obtained from the patient after a discussion of the risks, benefits, and alternatives to treatment. Questions regarding the procedure were encouraged and answered. The right neck was prepped with chlorhexidine in a sterile fashion, and a sterile drape was applied covering the operative field. Maximum barrier sterile technique with sterile gowns and gloves were used for the procedure. A timeout was performed prior to the initiation of the procedure. A micropuncture kit was utilized to access the right internal jugular vein under direct, real-time ultrasound guidance after the overlying soft tissues were anesthetized with 1% lidocaine with epinephrine. Ultrasound image documentation was performed. The microwire was kinked to measure appropriate catheter length. A stiff wire was advanced to the level of the IVC. A 16 cm hemodialysis catheter was then placed over the wire. Final catheter positioning was confirmed and documented with a spot radiographic image. The catheter aspirates and flushes normally. The catheter was flushed with appropriate volume heparin dwells. Dressings were applied. The patient tolerated the procedure well without immediate post procedural complication.  IMPRESSION: Status post right IJ non tunneled triple-lumen hemodialysis catheter. Signed, Dulcy Fanny. Nadene Rubins, RPVI [facility] Radiology Electronically Signed   By: Corrie Mckusick D.O.   On: 07/29/2021 17:08   DG CHEST PORT 1 VIEW  Result Date: 07/25/2021 CLINICAL DATA:  Central line placement.  EXAM: PORTABLE CHEST 1 VIEW COMPARISON:  07/24/2021. FINDINGS: Left IJ central line tip is likely up against the lateral wall of the SVC. Right IJ central line tip is in the SVC. Nasogastric tube is followed into the stomach with the tip projecting beyond the inferior margin of the image. Endotracheal to terminates approximately 4.6 cm above the carina. Heart size stable. Bibasilar collapse/consolidation with a left pleural effusion. No pneumothorax. Biapical pleural thickening. IMPRESSION: 1. Left IJ central line placement without complicating feature. 2. Bibasilar collapse/consolidation.  Pneumonia cannot be excluded. 3. Left pleural effusion. Electronically Signed   By: Lorin Picket M.D.   On: 07/25/2021 15:13   DG Chest Port 1 View  Result Date: 07/24/2021 CLINICAL DATA:  Respirator dependent Evaluate atelectasis. pleural effusion. pneumothorax EXAM: PORTABLE CHEST 1 VIEW COMPARISON:  07/23/2021 and older studies. FINDINGS: Mild increase in right lung base opacity. Stable mild medial left lung base opacity. Remainder of the lungs is clear. Possible small right effusion.  No pneumothorax. Endotracheal tube, nasal/orogastric tube and right internal jugular central venous catheter are stable in well positioned. IMPRESSION: 1. Mild increase in right lung base opacity, likely due to atelectasis. Possible small right effusion. 2. No other change. 3. Stable well-positioned support apparatus. Electronically Signed   By: Lajean Manes M.D.   On: 07/24/2021 08:38   DG Abd 1 View  Result Date: 07/23/2021 CLINICAL DATA:  NG tube placement EXAM: ABDOMEN - 1 VIEW COMPARISON:  07/22/2021 FINDINGS: Esophageal tube tip overlies the proximal stomach, side-port in the region of GE junction. Endotracheal tube tip about 3 cm superior to the carina. Gas-filled slightly distended small bowel in the left upper quadrant. IMPRESSION: Esophageal tube tip overlies the proximal stomach, side-port in the region of GE junction,  further advancement could be considered for more optimal positioning. Electronically Signed   By: Donavan Foil M.D.   On: 07/23/2021 20:20   ECHOCARDIOGRAM COMPLETE  Result Date: 07/23/2021    ECHOCARDIOGRAM REPORT   Patient Name:   Lounell Schumacher Date of Exam: 07/23/2021 Medical Rec #:  [id number]       Height:       61.0 in Accession #:    [id number]      Weight:       215.0 lb Date of Birth:  Oct 06, 1955       BSA:          1.948 m Patient Age:    43 years        BP:           110/61 mmHg Patient Gender: F               HR:           82 bpm. Exam Location:  Inpatient Procedure: 2D Echo, Cardiac Doppler, Color Doppler and Intracardiac            Opacification Agent Indications:    Abnormal ECG R94.31                 Murmur R01.1  History:        Patient has no prior history of Echocardiogram examinations.  Risk Factors:Hypertension and Dyslipidemia.  Sonographer:    Darlina Sicilian RDCS Referring Phys: Idamae Schuller IMPRESSIONS  1. Mid-systolic obliteration of the LV cavity is noted. Left ventricular ejection fraction, by estimation, is >75%. The left ventricle has hyperdynamic function. The left ventricle has no regional wall motion abnormalities. There is mild left ventricular hypertrophy. Left ventricular diastolic parameters are consistent with Grade I diastolic dysfunction (impaired relaxation).  2. Right ventricular systolic function is hyperdynamic. The right ventricular size is not well visualized.  3. The mitral valve is grossly normal. No evidence of mitral valve regurgitation.  4. The aortic valve was not well visualized. Aortic valve regurgitation is mild. Aortic valve sclerosis/calcification is present, without any evidence of aortic stenosis.  5. The inferior vena cava is normal in size with greater than 50% respiratory variability, suggesting right atrial pressure of 3 mmHg. Comparison(s): No prior Echocardiogram. FINDINGS  Left Ventricle: Mid-systolic obliteration of the LV cavity is  noted. Left ventricular ejection fraction, by estimation, is >75%. The left ventricle has hyperdynamic function. The left ventricle has no regional wall motion abnormalities. Definity contrast agent was given IV to delineate the left ventricular endocardial borders. The left ventricular internal cavity size was normal in size. There is mild left ventricular hypertrophy. Left ventricular diastolic parameters are consistent with Grade I  diastolic dysfunction (impaired relaxation). Indeterminate filling pressures. Right Ventricle: The right ventricular size is not well visualized. Right vetricular wall thickness was not well visualized. Right ventricular systolic function is hyperdynamic. Left Atrium: Left atrial size was normal in size. Right Atrium: Right atrial size was normal in size. Pericardium: There is no evidence of pericardial effusion. Mitral Valve: The mitral valve is grossly normal. No evidence of mitral valve regurgitation. Tricuspid Valve: The tricuspid valve is not well visualized. Tricuspid valve regurgitation is not demonstrated. Aortic Valve: The aortic valve was not well visualized. Aortic valve regurgitation is mild. Aortic regurgitation PHT measures 538 msec. Aortic valve sclerosis/calcification is present, without any evidence of aortic stenosis. Pulmonic Valve: The pulmonic valve was not well visualized. Pulmonic valve regurgitation is not visualized. Aorta: Aortic root could not be assessed. Venous: The inferior vena cava is normal in size with greater than 50% respiratory variability, suggesting right atrial pressure of 3 mmHg. IAS/Shunts: The interatrial septum was not well visualized.   Diastology LV e' medial:    5.00 cm/s LV E/e' medial:  12.7 LV e' lateral:   4.79 cm/s LV E/e' lateral: 13.2  RIGHT VENTRICLE RV S prime:     24.40 cm/s TAPSE (M-mode): 1.5 cm LEFT ATRIUM             Index        RIGHT ATRIUM           Index LA Vol (A2C):   39.6 ml 20.33 ml/m  RA Area:     11.50 cm LA Vol  (A4C):   47.3 ml 24.28 ml/m  RA Volume:   24.10 ml  12.37 ml/m LA Biplane Vol: 43.8 ml 22.49 ml/m  AORTIC VALVE AI PHT:      538 msec MITRAL VALVE MV Area (PHT): 2.42 cm MV Decel Time: 314 msec MV E velocity: 63.40 cm/s MV A velocity: 94.30 cm/s MV E/A ratio:  0.67 Lyman Bishop MD Electronically signed by Lyman Bishop MD Signature Date/Time: 07/23/2021/11:44:42 AM    Final    DG CHEST PORT 1 VIEW  Result Date: 07/23/2021 CLINICAL DATA:  Intubated EXAM: PORTABLE CHEST 1 VIEW COMPARISON:  Radiograph 07/22/2021 FINDINGS:  Endotracheal tube overlies the midthoracic trachea approximately 2.7 cm above the carina. Right neck catheter tip overlies the distal superior vena cava. Orogastric tube tip and side port overlie the stomach. Unchanged cardiomediastinal silhouette. Unchanged low lung volumes and bibasilar opacities favored to be atelectasis. Haziness of the right hemithorax likely due to radiographic technique. No definite new airspace disease. Right basilar subsegmental atelectasis. No large pleural effusion. No pneumothorax. Bones are unchanged. IMPRESSION: Unchanged low lung volumes and bibasilar opacities favored to be atelectasis. Endotracheal tube tip overlies the midthoracic trachea approximately 2.7 cm above the carina. Electronically Signed   By: Maurine Simmering M.D.   On: 07/23/2021 09:10   DG CHEST PORT 1 VIEW  Result Date: 07/22/2021 CLINICAL DATA:  Intubated and on ventilator. EXAM: PORTABLE CHEST 1 VIEW COMPARISON:  Radiograph yesterday. FINDINGS: Endotracheal tube tip at the level of the clavicular heads. Tip and side port of the enteric tube below the diaphragm in the stomach. There is a new right internal jugular large-bore catheter with tip overlying the lower SVC. No pneumothorax. Lower lung volumes from prior exam with bibasilar volume loss. Streaky atelectasis in the right greater than left lung base stable heart size and mediastinal contours. IMPRESSION: 1. Endotracheal tube tip at the  clavicular heads. Enteric tube tip and side-port below the diaphragm in the stomach. Right internal jugular catheter with tip overlying the lower SVC. No pneumothorax. 2. Lower lung volumes with bibasilar volume loss and streaky atelectasis. Electronically Signed   By: Keith Rake M.D.   On: 07/22/2021 19:10   DG Abdomen 1 View  Result Date: 07/22/2021 CLINICAL DATA:  Check gastric catheter placement EXAM: ABDOMEN - 1 VIEW COMPARISON:  CT from the previous day. FINDINGS: Gastric catheter is been advanced into the stomach. No free air is seen. The known small bowel obstruction is not well appreciated on this exam. IMPRESSION: Gastric catheter within the stomach. Electronically Signed   By: Inez Catalina M.D.   On: 07/22/2021 02:31   CT ABDOMEN PELVIS WO CONTRAST  Result Date: 07/22/2021 CLINICAL DATA:  Abdominal pain, acute.  Nausea and vomiting. EXAM: CT ABDOMEN AND PELVIS WITHOUT CONTRAST TECHNIQUE: Multidetector CT imaging of the abdomen and pelvis was performed following the standard protocol without IV contrast. RADIATION DOSE REDUCTION: This exam was performed according to the departmental dose-optimization program which includes automated exposure control, adjustment of the mA and/or kV according to patient size and/or use of iterative reconstruction technique. COMPARISON:  CT 09/13/2019 FINDINGS: Lower chest: Ill-defined opacities in the right lower and middle lobe favoring atelectasis. No pleural fluid. Hepatobiliary: No focal hepatic abnormality on this unenhanced exam. Layering gallstones and probable sludge. No pericholecystic inflammation or biliary dilatation. Pancreas: Pancreatic atrophy.  No ductal dilatation or inflammation. Spleen: Punctate granuloma.  Normal in size. Adrenals/Urinary Tract: No adrenal nodule. Mild bilateral renal parenchymal atrophy. No hydronephrosis. Small cyst in the lower left kidney, needing no further follow-up. No renal calculi. Unremarkable urinary bladder.  Stomach/Bowel: Large complex lower ventral abdominal wall hernia, with hernia contents deviating to the right. Majority of the small bowel is located within this ventral abdominal wall hernia. Hernia contains multiple loops of small bowel. Small bowel obstruction with dilated fluid-filled small bowel. The stomach is prominently distended with fluid. There is also fluid distending the included distal esophagus. Small bowel transition point right aspect of the ventral hernia, for example series 4, image 44 and series 6, image 48. Multiple enteric sutures are noted within small bowel in the hernia sac. Multifocal  colonic diverticulosis without focal diverticulitis. There is mild mesenteric edema in the small bowel without small bowel pneumatosis. Vascular/Lymphatic: Aortic atherosclerosis. No aortic aneurysm. No portal venous or mesenteric gas. No bulky abdominopelvic adenopathy. Reproductive: Electronic records lists history of hysterectomy, however in atrophic uterus is visualized in the pelvis. No adnexal mass. Other: No ascites or free air. Mesenteric edema and stranding involving small bowel both within the ventral abdominal wall hernia at and just proximal to. Musculoskeletal: Scoliosis and degenerative change in the spine. There are no acute or suspicious osseous abnormalities. IMPRESSION: 1. Small bowel obstruction. There is a large lower abdominal ventral abdominal wall hernia containing majority of the small bowel, the transition point is located within this ventral abdominal wall hernia. 2. Colonic diverticulosis without focal diverticulitis. 3. Cholelithiasis without acute inflammation. Aortic Atherosclerosis (ICD10-I70.0). Electronically Signed   By: Keith Rake M.D.   On: 07/22/2021 00:20   DG Chest Portable 1 View  Result Date: 07/21/2021 CLINICAL DATA:  Altered mental status and hypotension EXAM: PORTABLE CHEST 1 VIEW COMPARISON:  01/12/2016 FINDINGS: The heart size and mediastinal contours are  within normal limits. Both lungs are clear. The visualized skeletal structures are unremarkable. IMPRESSION: No active disease. Electronically Signed   By: Inez Catalina M.D.   On: 07/21/2021 20:27     Subjective: No acute issues or events overnight discussed at bedside with patient's sister otherwise stable and agreeable for discharge home   Discharge Exam: Vitals:   08/16/21 0742 08/16/21 1103  BP: 121/76 120/67  Pulse: 95 (!) 110  Resp: 15 17  Temp: 98.1 F (36.7 C) (!) 97.4 F (36.3 C)  SpO2: 100% 97%   Vitals:   08/16/21 0051 08/16/21 0437 08/16/21 0742 08/16/21 1103  BP: 117/74 114/71 121/76 120/67  Pulse: 91 88 95 (!) 110  Resp: '14 10 15 17  ' Temp: 98.1 F (36.7 C) 97.9 F (36.6 C) 98.1 F (36.7 C) (!) 97.4 F (36.3 C)  TempSrc: Oral Oral Oral Oral  SpO2: 93% 95% 100% 97%  Height:        General:  Pleasantly resting in bed, No acute distress. HEENT:  Normocephalic atraumatic.  Sclerae nonicteric, noninjected.  Extraocular movements intact bilaterally. Neck:  Without mass or deformity.  Trachea is midline. Lungs:  Clear to auscultate bilaterally without rhonchi, wheeze, or rales. Heart:  Regular rate and rhythm.  Without murmurs, rubs, or gallops. Abdomen:  Soft, nontender, nondistended.  Without guarding or rebound. Extremities: Without cyanosis, clubbing, edema, or obvious deformity. Skin:  Warm and dry, no erythema, no ulcerations.   The results of significant diagnostics from this hospitalization (including imaging, microbiology, ancillary and laboratory) are listed below for reference.     Microbiology: No results found for this or any previous visit (from the past 240 hour(s)).   Labs: BNP (last 3 results) No results for input(s): "BNP" in the last 8760 hours. Basic Metabolic Panel: Recent Labs  Lab 08/11/21 0136 08/12/21 0130 08/13/21 0155 08/14/21 0344 08/15/21 0605 08/16/21 0344  NA 140 139 142 142 143 143  K 3.5 3.7 3.8 4.0 4.0 4.1  CL 106  104 105 104 107 108  CO2 '25 25 26 26 26 27  ' GLUCOSE 97 98 93 93 100* 89  BUN 34* 32* 31* 34* 33* 31*  CREATININE 3.92* 3.58* 3.03* 2.80* 2.72* 2.66*  CALCIUM 8.3* 8.4* 8.9 8.9 9.1 9.0  MG 1.3* 1.4* 1.5* 1.7  --   --   PHOS 4.0 3.6 3.1 2.6 2.2* 2.6  Liver Function Tests: Recent Labs  Lab 08/12/21 0130 08/13/21 0155 08/14/21 0344 08/15/21 0605 08/16/21 0344  ALBUMIN 1.7* 2.0* 1.9* 1.9* 1.8*   No results for input(s): "LIPASE", "AMYLASE" in the last 168 hours. No results for input(s): "AMMONIA" in the last 168 hours. CBC: Recent Labs  Lab 08/09/21 1428 08/11/21 0136 08/13/21 0155  WBC 6.9 8.4 7.3  HGB 8.4* 7.8* 9.1*  HCT 25.8* 24.1* 28.6*  MCV 94.5 93.8 95.0  PLT 159 168 253   Cardiac Enzymes: No results for input(s): "CKTOTAL", "CKMB", "CKMBINDEX", "TROPONINI" in the last 168 hours. BNP: Invalid input(s): "POCBNP" CBG: Recent Labs  Lab 08/14/21 1116 08/14/21 1716 08/15/21 1702 08/16/21 0846 08/16/21 1101  GLUCAP 99 127* 94 79 102*   D-Dimer No results for input(s): "DDIMER" in the last 72 hours. Hgb A1c No results for input(s): "HGBA1C" in the last 72 hours. Lipid Profile No results for input(s): "CHOL", "HDL", "LDLCALC", "TRIG", "CHOLHDL", "LDLDIRECT" in the last 72 hours. Thyroid function studies No results for input(s): "TSH", "T4TOTAL", "T3FREE", "THYROIDAB" in the last 72 hours.  Invalid input(s): "FREET3" Anemia work up No results for input(s): "VITAMINB12", "FOLATE", "FERRITIN", "TIBC", "IRON", "RETICCTPCT" in the last 72 hours. Urinalysis    Component Value Date/Time   COLORURINE AMBER (A) 07/24/2021 0905   APPEARANCEUR CLOUDY (A) 07/24/2021 0905   LABSPEC 1.020 07/24/2021 0905   PHURINE 5.0 07/24/2021 0905   GLUCOSEU NEGATIVE 07/24/2021 0905   HGBUR MODERATE (A) 07/24/2021 0905   BILIRUBINUR NEGATIVE 07/24/2021 0905   BILIRUBINUR negative 07/11/2017 1553   KETONESUR NEGATIVE 07/24/2021 0905   PROTEINUR 100 (A) 07/24/2021 0905    UROBILINOGEN 0.2 07/11/2017 1553   UROBILINOGEN 1.0 09/27/2010 2313   NITRITE NEGATIVE 07/24/2021 0905   LEUKOCYTESUR NEGATIVE 07/24/2021 0905   Sepsis Labs Recent Labs  Lab 08/09/21 1428 08/11/21 0136 08/13/21 0155  WBC 6.9 8.4 7.3   Microbiology No results found for this or any previous visit (from the past 240 hour(s)).   Time coordinating discharge: Over 30 minutes  SIGNED:   Little Ishikawa, DO [facility] 08/16/2021, 1:34 PM Pager   If 7PM-7AM, please contact night-coverage [email or web address]

## 2021-08-15 NOTE — Progress Notes (Cosign Needed)
   Durable Medical Equipment (From admission, onward)        Start     Ordered  08/15/21 1605  For home use only DME 3 n 1  Once       08/15/21 1605  08/15/21 1605  For home use only DME 4 wheeled rolling walker with seat  Once      Question:  [name]  Answer:  Ambulatory dysfunction  08/15/21 1605  08/15/21 1605  For home use only DME Shower stool  Once       08/15/21 1605  08/15/21 1605  For home use only DME high strength lightweight manual wheelchair with seat cushion  Once      Comments: Patient suffers from ambulatory dysfunction which impairs their ability to perform daily activities like bathing, dressing, feeding, grooming, and toileting in the home.  A walker will not resolve  issue with performing activities of daily living. A wheelchair will allow patient to safely perform daily activities.Length of need Lifetime. (THEN ONE OF THESE TWO:) Patient requires a size of large/heavy duty which is not available in a standard or lightweight wheelchair and patient spends at least two hours per day in their chair. Accessories: elevating leg rests (ELRs), wheel locks, extensions and anti-tippers. Back cushion  08/15/21 1605

## 2021-08-15 NOTE — Progress Notes (Signed)
Physical Therapy Treatment Patient Details Name: [name] MRN: [id number] DOB: 1955-12-08 Today's Date: 08/15/2021   History of Present Illness 66 y.o. female admitted 07/21/21 with abdominal pain due to incarcerated ventral hernia and SBO. S/p emergent ex lap, small bowel resection, incarcerated ventral hernia repair with JP drain placement on 7/7. ETT 7/7-7/11. Course complicated by AKI on CKD 3; HD initiated 7/10. 7/20 Rt IJ TDC placed. PMHx:chronic incarcerated hernia s/p multiple repairs, SBO, HTN, HLD, CKD    PT Comments    Patient progressing well towards PT goals. Session focused on ambulation progression and functional mobility/transfers. Tolerated gait training with use of RW and min guard assist for support. Continues to exhibit fatigue and weakness requiring seated rest break during ambulation. HR ranging from 119-160 bpm with activity. Pt reports her sisters will be staying with her to assist at d/c. Pt declining SNF however with her sister's support/assist, pt should be safe to d/c home with HHPT and recommended DME. Will follow acutely.   Recommendations for follow up therapy are one component of a multi-disciplinary discharge planning process, led by the attending physician.  Recommendations may be updated based on patient status, additional functional criteria and insurance authorization.  Follow Up Recommendations  Home health PT (pt refusing SNF) Can patient physically be transported by private vehicle: Yes   Assistance Recommended at Discharge Frequent or constant Supervision/Assistance  Patient can return home with the following A little help with walking and/or transfers;A little help with bathing/dressing/bathroom;Assistance with cooking/housework;Assist for transportation;Direct supervision/assist for medications management;Help with stairs or ramp for entrance   Equipment Recommendations  Rollator (4 wheels);BSC/3in1    Recommendations for Other Services        Precautions / Restrictions Precautions Precautions: Fall;Other (comment) Precaution Comments: watch HR Restrictions Weight Bearing Restrictions: No     Mobility  Bed Mobility Overal bed mobility: Needs Assistance Bed Mobility: Sit to Supine       Sit to supine: Supervision   General bed mobility comments: Able to bring LEs into bed without assist and reposition self.    Transfers Overall transfer level: Needs assistance Equipment used: Rolling walker (2 wheels) Transfers: Sit to/from Stand Sit to Stand: Min guard           General transfer comment: Min guard for safety. Stood from elevated toilet x1, from EOB x1, from chair x1, cues for hand placement.    Ambulation/Gait Ambulation/Gait assistance: Min guard Gait Distance (Feet): 50 Feet (x2 bouts) Assistive device: Rolling walker (2 wheels) Gait Pattern/deviations: Step-through pattern, Decreased stride length Gait velocity: decr Gait velocity interpretation: <1.8 ft/sec, indicate of risk for recurrent falls   General Gait Details: Slow, mildly unsteady gait with bil knee instability/weakness but no buckling. 1 seated rest break. 2 standing rest breaks. HR ranging from 119-160 bpm.   Stairs             Wheelchair Mobility    Modified Rankin (Stroke Patients Only)       Balance Overall balance assessment: Needs assistance Sitting-balance support: Feet supported, No upper extremity supported Sitting balance-Leahy Scale: Good     Standing balance support: During functional activity Standing balance-Leahy Scale: Fair Standing balance comment: static standing to perform pericare, RW for gait                            Cognition Arousal/Alertness: Awake/alert Behavior During Therapy: WFL for tasks assessed/performed Overall Cognitive Status: Within Functional Limits for tasks assessed  Exercises      General Comments  General comments (skin integrity, edema, etc.): HR ranging from 119-160 bpm with activity.      Pertinent Vitals/Pain Pain Assessment Pain Assessment: Faces Faces Pain Scale: Hurts even more Pain Location: abdomen Pain Descriptors / Indicators: Grimacing, Sore, Discomfort Pain Intervention(s): Monitored during session, Repositioned, Patient requesting pain meds-RN notified    Home Living                          Prior Function            PT Goals (current goals can now be found in the care plan section) Progress towards PT goals: Progressing toward goals    Frequency    Min 3X/week      PT Plan Discharge plan needs to be updated    Co-evaluation              AM-PAC PT "6 Clicks" Mobility   Outcome Measure  Help needed turning from your back to your side while in a flat bed without using bedrails?: A Little Help needed moving from lying on your back to sitting on the side of a flat bed without using bedrails?: A Little Help needed moving to and from a bed to a chair (including a wheelchair)?: A Little Help needed standing up from a chair using your arms (e.g., wheelchair or bedside chair)?: A Little Help needed to walk in hospital room?: A Little Help needed climbing 3-5 steps with a railing? : Total 6 Click Score: 16    End of Session Equipment Utilized During Treatment: Gait belt Activity Tolerance: Patient tolerated treatment well Patient left: in bed;with call bell/phone within reach Nurse Communication: Mobility status;Patient requests pain meds PT Visit Diagnosis: Other abnormalities of gait and mobility (R26.89);Muscle weakness (generalized) (M62.81);Difficulty in walking, not elsewhere classified (R26.2);Pain Pain - part of body:  (abdomen)     Time: 4403-4742 PT Time Calculation (min) (ACUTE ONLY): 23 min  Charges:  $Gait Training: 8-22 mins $Therapeutic Activity: 8-22 mins                     Marisa Severin, PT, DPT Acute Rehabilitation  Services Secure chat preferred Office [place] 08/15/2021, 12:02 PM

## 2021-08-16 LAB — RENAL FUNCTION PANEL
Albumin: 1.8 g/dL — ABNORMAL LOW (ref 3.5–5.0)
Anion gap: 8 (ref 5–15)
BUN: 31 mg/dL — ABNORMAL HIGH (ref 8–23)
CO2: 27 mmol/L (ref 22–32)
Calcium: 9 mg/dL (ref 8.9–10.3)
Chloride: 108 mmol/L (ref 98–111)
Creatinine, Ser: 2.66 mg/dL — ABNORMAL HIGH (ref 0.44–1.00)
GFR, Estimated: 19 mL/min — ABNORMAL LOW (ref >60)
Glucose, Bld: 89 mg/dL (ref 70–99)
Phosphorus: 2.6 mg/dL (ref 2.5–4.6)
Potassium: 4.1 mmol/L (ref 3.5–5.1)
Sodium: 143 mmol/L (ref 135–145)

## 2021-08-16 LAB — GLUCOSE, CAPILLARY
Glucose-Capillary: 79 mg/dL (ref 70–99)
Glucose-Capillary: 102 mg/dL — ABNORMAL HIGH (ref 70–99)
Glucose-Capillary: 106 mg/dL — ABNORMAL HIGH (ref 70–99)

## 2021-08-16 MED ORDER — ACETAMINOPHEN 325 MG PO TABS: 650.0000 mg | ORAL_TABLET | Freq: Four times a day (QID) | ORAL | 0 refills | Status: AC | PRN

## 2021-08-16 MED ORDER — METOPROLOL TARTRATE 25 MG PO TABS: 25.0000 mg | ORAL_TABLET | Freq: Two times a day (BID) | ORAL | 2 refills | Status: AC

## 2021-08-16 MED ORDER — LOPERAMIDE HCL 2 MG PO CAPS: 2.0000 mg | ORAL_CAPSULE | ORAL | 0 refills | Status: AC | PRN

## 2021-08-16 MED ORDER — RENA-VITE PO TABS: 1.0000 | ORAL_TABLET | Freq: Every day | ORAL | 5 refills | Status: AC

## 2021-08-16 MED ORDER — BACITRACIN-NEOMYCIN-POLYMYXIN OINTMENT TUBE: 1.0000 | TOPICAL_OINTMENT | Freq: Three times a day (TID) | CUTANEOUS | 2 refills | Status: AC

## 2021-08-16 MED ORDER — MAGNESIUM OXIDE -MG SUPPLEMENT 400 (240 MG) MG PO TABS: 400.0000 mg | ORAL_TABLET | Freq: Two times a day (BID) | ORAL | 0 refills | Status: AC

## 2021-08-16 MED ORDER — PANTOPRAZOLE SODIUM 40 MG PO TBEC: 40.0000 mg | DELAYED_RELEASE_TABLET | Freq: Every day | ORAL | 5 refills | Status: AC

## 2021-08-16 MED ORDER — TRAMADOL HCL 50 MG PO TABS: 50.0000 mg | ORAL_TABLET | Freq: Four times a day (QID) | ORAL | 0 refills | Status: AC | PRN

## 2021-08-16 MED ORDER — POLYSACCHARIDE IRON COMPLEX 150 MG PO CAPS
150.0000 mg | ORAL_CAPSULE | Freq: Every day | ORAL | 0 refills | Status: AC
Start: 2021-08-16 — End: ?

## 2021-08-16 MED ORDER — COLLAGENASE 250 UNIT/GM EX OINT: TOPICAL_OINTMENT | Freq: Every day | CUTANEOUS | 0 refills | Status: AC

## 2021-08-16 NOTE — Progress Notes (Signed)
Occupational Therapy Treatment Note  Seen for additional session to educate pt/sister on completion of ADL tasks with necessary DME and AE. Pt with significant increase in independence with use of AE. Continue to recommend [place]. Pt/sister very appreciative.     08/16/21 1230  OT Visit Information  Last OT Received On 08/16/21  Assistance Needed +1  History of Present Illness 66 y.o. female admitted 07/21/21 with abdominal pain due to incarcerated ventral hernia and SBO. S/p emergent ex lap, small bowel resection, incarcerated ventral hernia repair with JP drain placement on 7/7. ETT 7/7-7/11. Course complicated by AKI on CKD 3; HD initiated 7/10. 7/20 Rt IJ TDC placed. PMHx:chronic incarcerated hernia s/p multiple repairs, SBO, HTN, HLD, CKD  Precautions  Precautions Fall;Other (comment)  Precaution Comments watch HR; open abdominal wound and wound under pannus  Pain Assessment  Pain Assessment Faces  Faces Pain Scale 2  Pain Location abdomen  Pain Descriptors / Indicators Discomfort  Pain Intervention(s) Limited activity within patient's tolerance  Cognition  Arousal/Alertness Awake/alert  Behavior During Therapy WFL for tasks assessed/performed  Overall Cognitive Status Within Functional Limits for tasks assessed  ADL  Functional mobility during ADLs Supervision/safety;Rolling walker (2 wheels)  General ADL Comments Educated on use of reacher, sock aid and long handled sponge for LB ADL. Pt set up/S wtih use of AE. Sister present for education and made aware of how to attain equipment. Discussed clothing types to make ADL tasks easier wtihuse of sling.  Bed Mobility  Overal bed mobility Modified Independent  General bed mobility comments heavy use of rails  Transfers  Sit to Stand Supervision  OT - End of Session  Equipment Utilized During Treatment Rolling walker (2 wheels)  Activity Tolerance Patient tolerated treatment well  Patient left in bed;with call bell/phone within  reach;with family/visitor present  Nurse Communication Mobility status;Other (comment) (sister has questions about wound care)  OT Assessment/Plan  OT Plan Discharge plan remains appropriate;Frequency remains appropriate  OT Visit Diagnosis Unsteadiness on feet (R26.81);Muscle weakness (generalized) (M62.81);Pain  OT Frequency (ACUTE ONLY) Min 3X/week  Follow Up Recommendations Home health OT  Assistance recommended at discharge Frequent or constant Supervision/Assistance  Patient can return home with the following A little help with walking and/or transfers;A lot of help with bathing/dressing/bathroom;Assistance with cooking/housework;Assist for transportation;Help with stairs or ramp for entrance  OT Equipment BSC/3in1;Tub/shower seat  AM-PAC OT "6 Clicks" Daily Activity Outcome Measure (Version 2)  Help from another person eating meals? 4  Help from another person taking care of personal grooming? 4  Help from another person toileting, which includes using toliet, bedpan, or urinal? 3  Help from another person bathing (including washing, rinsing, drying)? 2  Help from another person to put on and taking off regular upper body clothing? 3  Help from another person to put on and taking off regular lower body clothing? 3  6 Click Score 19  Progressive Mobility  What is the highest level of mobility based on the progressive mobility assessment? Level 5 (Walks with assist in room/hall) - Balance while stepping forward/back and can walk in room with assist - Complete  Activity Ambulated with assistance in room  OT Goal Progression  Progress towards OT goals Progressing toward goals  Acute Rehab OT Goals  Patient Stated Goal to go home  OT Goal Formulation With patient  Time For Goal Achievement 08/26/21  Potential to Achieve Goals Good  ADL Goals  Pt Will Perform Grooming with supervision;sitting  Pt Will Perform Upper Body  Bathing with min assist;sitting  Pt Will Perform Upper Body  Dressing with min assist;sitting  Pt Will Transfer to Toilet with mod assist;stand pivot transfer;bedside commode  Pt/caregiver will Perform Home Exercise Program Increased ROM;Increased strength;Both right and left upper extremity;With Supervision  Pt Will Perform Lower Body Dressing with min assist;with adaptive equipment;sit to/from stand  Pt Will Perform Toileting - Clothing Manipulation and hygiene with set-up;sit to/from stand  Additional ADL Goal #1 Pt will manage pannus pain and wound on pannus to increase independence with ADL with min A (possible use of sling)  Additional ADL Goal #2 Pt will independently verbalzie 3 strategies to reduce risk of falls  OT Time Calculation  OT Start Time (ACUTE ONLY) 1146  OT Stop Time (ACUTE ONLY) 1205  OT Time Calculation (min) 19 min  OT General Charges  $OT Visit 1 Visit  OT Treatments  $Self Care/Home Management  8-22 mins   Maurie Boettcher, OT/L   Acute OT Clinical Specialist [facility] Pager [phone] Office [phone]

## 2021-08-16 NOTE — Progress Notes (Signed)
Occupational Therapy Treatment Patient Details Name: [name] MRN: [id number] DOB: 06/21/1955 Today's Date: 08/16/2021   History of present illness 66 y.o. female admitted 07/21/21 with abdominal pain due to incarcerated ventral hernia and SBO. S/p emergent ex lap, small bowel resection, incarcerated ventral hernia repair with JP drain placement on 7/7. ETT 7/7-7/11. Course complicated by AKI on CKD 3; HD initiated 7/10. 7/20 Rt IJ TDC placed. PMHx:chronic incarcerated hernia s/p multiple repairs, SBO, HTN, HLD, CKD   OT comments  Additional sling to support pannus and hold dressings in place fabricated - made longer which worked better. Educated sister and pt on use and recommend positioning prior to mobilizing OOB then adjusting once in standing position to help support pannus and keep dressings from falling out. Sling worked well and pt reports increased comfort with sling. Sister/pt verbalized understanding. Sister/pt also educated on use of Interdry to reduce MASD under pannus. Continue to recommend [place].   Recommendations for follow up therapy are one component of a multi-disciplinary discharge planning process, led by the attending physician.  Recommendations may be updated based on patient status, additional functional criteria and insurance authorization.    Follow Up Recommendations  Home health OT [facility] A[facility])    Assistance Recommended at Discharge Frequent or constant Supervision/Assistance  Patient can return home with the following  A little help with walking and/or transfers;A lot of help with bathing/dressing/bathroom;Assistance with cooking/housework;Assist for transportation;Help with stairs or ramp for entrance   Equipment Recommendations  BSC/3in1;Tub/shower seat    Recommendations for Other Services      Precautions / Restrictions Precautions Precautions: Fall;Other (comment) Precaution Comments: watch HR; open abdominal wound and wound under  pannus Restrictions Weight Bearing Restrictions: No       Mobility Bed Mobility Overal bed mobility: Modified Independent             General bed mobility comments: heavy use of rails    Transfers   Equipment used: Rolling walker (2 wheels)   Sit to Stand: Supervision                 Balance                                           ADL either performed or assessed with clinical judgement   ADL                                              Extremity/Trunk Assessment              Vision       Perception     Praxis      Cognition Arousal/Alertness: Awake/alert Behavior During Therapy: WFL for tasks assessed/performed Overall Cognitive Status: Within Functional Limits for tasks assessed                                          Exercises      Shoulder Instructions       General Comments Additional sling fabricated to use to keep dressings in place during mobility and to support weight of pannus. Sister presnt for education. Sister/pt also educated on use of Interdry under pannus  and instructed to use for 5 days unless soiled significantly. Stressed importance of letting fabric hang out from pannus in order to wick away moisture. Able to ambulate @ room with sling after positioned.    Pertinent Vitals/ Pain       Pain Assessment Pain Assessment: Faces Faces Pain Scale: Hurts a little bit Pain Location: abdomen Pain Descriptors / Indicators: Discomfort Pain Intervention(s): Limited activity within patient's tolerance  Home Living                                          Prior Functioning/Environment              Frequency  Min 3X/week        Progress Toward Goals  OT Goals(current goals can now be found in the care plan section)  Progress towards OT goals: Progressing toward goals  Acute Rehab OT Goals Patient Stated Goal: to get better and go home OT  Goal Formulation: With patient Time For Goal Achievement: 08/26/21 Potential to Achieve Goals: Good ADL Goals Pt Will Perform Grooming: with supervision;sitting Pt Will Perform Upper Body Bathing: with min assist;sitting Pt Will Perform Upper Body Dressing: with min assist;sitting Pt Will Perform Lower Body Dressing: with min assist;with adaptive equipment;sit to/from stand Pt Will Transfer to Toilet: with mod assist;stand pivot transfer;bedside commode Pt Will Perform Toileting - Clothing Manipulation and hygiene: with set-up;sit to/from stand Pt/caregiver will Perform Home Exercise Program: Increased ROM;Increased strength;Both right and left upper extremity;With Supervision Additional ADL Goal #1: Pt will manage pannus pain and wound on pannus to increase independence with ADL with min A (possible use of sling) Additional ADL Goal #2: Pt will independently verbalzie 3 strategies to reduce risk of falls  Plan Discharge plan remains appropriate;Frequency remains appropriate    Co-evaluation                 AM-PAC OT "6 Clicks" Daily Activity     Outcome Measure   Help from another person eating meals?: None Help from another person taking care of personal grooming?: None Help from another person toileting, which includes using toliet, bedpan, or urinal?: A Little Help from another person bathing (including washing, rinsing, drying)?: A Lot Help from another person to put on and taking off regular upper body clothing?: A Little Help from another person to put on and taking off regular lower body clothing?: A Lot 6 Click Score: 18    End of Session Equipment Utilized During Treatment: Rolling walker (2 wheels)  OT Visit Diagnosis: Unsteadiness on feet (R26.81);Muscle weakness (generalized) (M62.81);Pain Pain - part of body:  (abdomen)   Activity Tolerance Patient tolerated treatment well   Patient Left in bed;with call bell/phone within reach;with family/visitor present    Nurse Communication Mobility status;Other (comment) (sister asking about wound care)        Time: 2774-1287 OT Time Calculation (min): 22 min  Charges: OT General Charges $OT Visit: 1 Visit OT Treatments $Therapeutic Activity: 8-22 mins  Maurie Boettcher, OT/L   Acute OT Clinical Specialist [place] Pager [phone] Office [phone]   [facility] 08/16/2021, 12:26 PM

## 2021-08-16 NOTE — Progress Notes (Signed)
Pt is full code and has not having PIV.  It is ok for not having IV this time per MD since pt anticipating d/c today.   Lavenia Atlas, RN

## 2021-08-16 NOTE — TOC Transition Note (Signed)
Transition of Care (TOC) - CM/SW Discharge Note Marvetta Gibbons RN, BSN Transitions of Care Unit 4E- RN Case Manager See Treatment Team for direct phone #    Patient Details  Name: [name] MRN: [id number] Date of Birth: 01/26/1955  Transition of Care [facility]) CM/SW Contact:  Dawayne Patricia, RN Phone Number: 08/16/2021, 2:07 PM   Clinical Narrative:    Pt stable for transition home today, Orders have been place for [facility] and DME needs.  CM has spoken with pt and sister at bedside.  Discussed HH and DME needs.  Pt confirmed DME needs- call made to [employer] and pt to speak with them regarding coverage then decide what to have delivered at this time.   List provided for [facility] choice Per CMS guidelines from medicare.gov website with star ratings (copy placed in shadow chart), after review- pt and sister have selected [place], [facility], [phone]-  Address, phone # and PCP all confirmed with pt. Sister to transport home.   Call made to [facility] w/ [facility]- per Caryl Pina they are not episodic w/ insurance and pt would have a higher cost- suggested [facility] has they are episodic with insurance- CM went back to Pt and sister and discussed further HH options - pt and sister have decided to go with [facility] for [facility] needs.  Kai Levins has accepted referral.   DME- rollator has been delivered to room- pt has decided to have 3n1, tub chair, w/c placed on hold w/ [employer] for now.     Final next level of care: [place] Barriers to Discharge: Barriers Resolved   Patient Goals and CMS Choice Patient states their goals for this hospitalization and ongoing recovery are:: return home CMS Medicare.gov Compare Post Acute Care list provided to:: Patient Choice offered to / list presented to : Patient, Sibling  Discharge Placement          Home w/ [facility]             Discharge Plan and Services In-house Referral: Clinical Social Work Discharge Planning Services: CM Consult Post Acute  Care Choice: Durable Medical Equipment, Home Health          DME Arranged: 3-N-1, Tub bench, Walker rolling with seat, Wheelchair manual DME Agency: [facility] Date DME Agency Contacted: 08/15/21 Time DME Agency Contacted: 33 Representative spoke with at DME Agency: Jodell Cipro HH Arranged: RN, PT, OT [place] Agency: [facility] Date [place]: 08/16/21 Time [place]: 1406 Representative spoke with at [place]: [place] ([place] Interventions     Readmission Risk Interventions    08/16/2021    2:07 PM  Readmission Risk Prevention Plan  Transportation Screening Complete  PCP or Specialist Appt within 5-7 Days Complete  Home Care Screening Complete  Medication Review (RN CM) Complete

## 2021-08-16 NOTE — Progress Notes (Signed)
Mobility Specialist - Progress Note   08/16/21 1504  Mobility  Activity Ambulated with assistance in hallway  Level of Assistance Minimal assist, patient does 75% or more  Assistive Device Four wheel walker  Distance Ambulated (ft) 120 ft (7ft x2)  Activity Response Tolerated fair  $Mobility charge 1 Mobility     Pre-mobility:102 HR, 119/80(92)  BP, 99% SpO2 During mobility: 117 HR  Pt received in BR and agreeable to mobility once finished. Stopped x1 for seated break due to c/o dizziness, SOB, and abdominal pain. Pt was returned to EOB with RN in room.   Larey Seat

## 2021-08-16 NOTE — Progress Notes (Signed)
Dressing for abdominal wound and abdominal fold were changed.   Lavenia Atlas, RN

## 2021-08-20 ENCOUNTER — Emergency Department (HOSPITAL_COMMUNITY)
Admission: EM | Admit: 2021-08-20 | Discharge: 2021-08-20 | Disposition: A | Discharge status: Home / Self Care | Payer: BC Managed Care – PPO | Attending: Emergency Medicine | Admitting: Emergency Medicine

## 2021-08-20 ENCOUNTER — Emergency Department (HOSPITAL_COMMUNITY): Payer: BC Managed Care – PPO

## 2021-08-20 ENCOUNTER — Other Ambulatory Visit: Payer: Self-pay

## 2021-08-20 DIAGNOSIS — Z4800 Encounter for change or removal of nonsurgical wound dressing: Secondary | ICD-10-CM | POA: Diagnosis present

## 2021-08-20 DIAGNOSIS — Z5189 Encounter for other specified aftercare: Secondary | ICD-10-CM

## 2021-08-20 LAB — BASIC METABOLIC PANEL
Anion gap: 11 (ref 5–15)
BUN: 36 mg/dL — ABNORMAL HIGH (ref 8–23)
CO2: 25 mmol/L (ref 22–32)
Calcium: 9.4 mg/dL (ref 8.9–10.3)
Chloride: 111 mmol/L (ref 98–111)
Creatinine, Ser: 2.75 mg/dL — ABNORMAL HIGH (ref 0.44–1.00)
GFR, Estimated: 18 mL/min — ABNORMAL LOW (ref >60)
Glucose, Bld: 106 mg/dL — ABNORMAL HIGH (ref 70–99)
Potassium: 4.2 mmol/L (ref 3.5–5.1)
Sodium: 147 mmol/L — ABNORMAL HIGH (ref 135–145)

## 2021-08-20 LAB — CBC WITH DIFFERENTIAL/PLATELET
Abs Immature Granulocytes: 0.29 10*3/uL — ABNORMAL HIGH (ref 0.00–0.07)
Basophils Absolute: 0 10*3/uL (ref 0.0–0.1)
Basophils Relative: 0 %
Eosinophils Absolute: 0 10*3/uL (ref 0.0–0.5)
Eosinophils Relative: 0 %
HCT: 31.2 % — ABNORMAL LOW (ref 36.0–46.0)
Hemoglobin: 9.8 g/dL — ABNORMAL LOW (ref 12.0–15.0)
Immature Granulocytes: 2 %
Lymphocytes Relative: 12 %
Lymphs Abs: 1.5 10*3/uL (ref 0.7–4.0)
MCH: 30.3 pg (ref 26.0–34.0)
MCHC: 31.4 g/dL (ref 30.0–36.0)
MCV: 96.6 fL (ref 80.0–100.0)
Monocytes Absolute: 1 10*3/uL (ref 0.1–1.0)
Monocytes Relative: 8 %
Neutro Abs: 9.1 10*3/uL — ABNORMAL HIGH (ref 1.7–7.7)
Neutrophils Relative %: 78 %
Platelets: 338 10*3/uL (ref 150–400)
RBC: 3.23 MIL/uL — ABNORMAL LOW (ref 3.87–5.11)
RDW: 18.2 % — ABNORMAL HIGH (ref 11.5–15.5)
WBC: 11.9 10*3/uL — ABNORMAL HIGH (ref 4.0–10.5)
nRBC: 0 % (ref 0.0–0.2)

## 2021-08-20 LAB — LACTIC ACID, PLASMA: Lactic Acid, Venous: 1.7 mmol/L (ref 0.5–1.9)

## 2021-08-20 NOTE — ED Provider Notes (Signed)
[place] DEPT Provider Note   CSN: [id number] Arrival date & time: 08/20/21  1649     History  Chief Complaint  Patient presents with   Wound Check    [name] is a 66 y.o. female who presents today for evaluation of wound drainage.  She was recently discharged from the hospital with a abdominal wound and plan to pack it. When home health nurse came today she had a significant increase fluid drainage from the wound with.  She denies any fevers, she otherwise feels well.  She reports she is eating and drinking normally without difficulty.  She denies any bleeding.  She has been having the wound dressing change daily.  She denies any abdominal pain.    HPI     Home Medications Prior to Admission medications   Medication Sig Start Date End Date Taking? Authorizing Provider  acetaminophen (TYLENOL) 325 MG tablet Take 2 tablets (650 mg total) by mouth every 6 (six) hours as needed for mild pain or fever. 08/16/21   Little Ishikawa, MD  collagenase (SANTYL) 250 UNIT/GM ointment Apply topically daily. 08/16/21   Little Ishikawa, MD  iron polysaccharides (NIFEREX) 150 MG capsule Take 1 capsule (150 mg total) by mouth daily. 08/16/21   Little Ishikawa, MD  loperamide (IMODIUM) 2 MG capsule Take 1 capsule (2 mg total) by mouth every 4 (four) hours as needed for diarrhea or loose stools. 08/16/21   Little Ishikawa, MD  magnesium oxide (MAG-OX) 400 (240 Mg) MG tablet Take 1 tablet (400 mg total) by mouth 2 (two) times daily. 08/16/21   Little Ishikawa, MD  metoprolol tartrate (LOPRESSOR) 25 MG tablet Take 1 tablet (25 mg total) by mouth 2 (two) times daily. 08/16/21   Little Ishikawa, MD  multivitamin (RENA-VIT) TABS tablet Take 1 tablet by mouth at bedtime. 08/16/21   Little Ishikawa, MD  neomycin-bacitracin-polymyxin (NEOSPORIN) OINT Apply 1 Application topically 3 (three) times daily. 08/16/21   Little Ishikawa, MD   pantoprazole (PROTONIX) 40 MG tablet Take 1 tablet (40 mg total) by mouth daily. 08/16/21   Little Ishikawa, MD      Allergies    Lisinopril    Review of Systems   Review of Systems  Physical Exam Updated Vital Signs BP 128/77   Pulse 83   Temp (!) 97.5 F (36.4 C) (Oral)   Resp 11   SpO2 100%  Physical Exam Vitals and nursing note reviewed.  Constitutional:      General: She is not in acute distress. HENT:     Head: Normocephalic and atraumatic.  Cardiovascular:     Rate and Rhythm: Normal rate.  Pulmonary:     Effort: Pulmonary effort is normal. No respiratory distress.  Abdominal:     Tenderness: There is no abdominal tenderness. There is no guarding or rebound.     Comments: Audible palpable subcutaneous nodules the abdominal wall.  Abdomen is soft, nontender. Wound over midline abdomen below the umbilicus, please see pictures   Musculoskeletal:     Cervical back: No rigidity.  Skin:    General: Skin is warm and dry.     Comments: Large wound on anterior abdomen with large amount of thin fluid drainage, and thick layer of slough with some nonviable tissue  Neurological:     Mental Status: She is alert. Mental status is at baseline.     Comments: Awake and alert, answers all questions appropriately.  Speech is  not slurred.    Psychiatric:        Mood and Affect: Mood normal.      Taken 08/15/21    Today:     ED Results / Procedures / Treatments   Labs (all labs ordered are listed, but only abnormal results are displayed) Labs Reviewed  CBC WITH DIFFERENTIAL/PLATELET - Abnormal; Notable for the following components:      Result Value   WBC 11.9 (*)    RBC 3.23 (*)    Hemoglobin 9.8 (*)    HCT 31.2 (*)    RDW 18.2 (*)    Neutro Abs 9.1 (*)    Abs Immature Granulocytes 0.29 (*)    All other components within normal limits  BASIC METABOLIC PANEL - Abnormal; Notable for the following components:   Sodium 147 (*)    Glucose, Bld 106 (*)    BUN  36 (*)    Creatinine, Ser 2.75 (*)    GFR, Estimated 18 (*)    All other components within normal limits  LACTIC ACID, PLASMA    EKG None  Radiology CT ABDOMEN PELVIS WO CONTRAST  Result Date: 08/20/2021 CLINICAL DATA:  Postoperative abdominal pain, ventral hernia repair, open ventral abdominal wound, increased drainage and odor EXAM: CT ABDOMEN AND PELVIS WITHOUT CONTRAST TECHNIQUE: Multidetector CT imaging of the abdomen and pelvis was performed following the standard protocol without IV contrast. RADIATION DOSE REDUCTION: This exam was performed according to the departmental dose-optimization program which includes automated exposure control, adjustment of the mA and/or kV according to patient size and/or use of iterative reconstruction technique. COMPARISON:  07/21/2021 FINDINGS: Lower chest: No acute abnormality.  Coronary artery calcifications. Hepatobiliary: No solid liver abnormality is seen. Tiny gallstones or sludge in the gallbladder. No gallbladder wall thickening, or biliary dilatation. Pancreas: Unremarkable. No pancreatic ductal dilatation or surrounding inflammatory changes. Spleen: Normal in size without significant abnormality. Adrenals/Urinary Tract: Adrenal glands are unremarkable. Kidneys are normal, without renal calculi, solid lesion, or hydronephrosis. Bladder is unremarkable. Stomach/Bowel: Stomach is within normal limits. Appendix appears normal. Interval resection of the distal small bowel with anastomosis of the distal ileum. Adjacent to the most distal ileal anastomosis, there are small locules of air within the mesentery (series 2, image 41). Descending and sigmoid diverticulosis. Vascular/Lymphatic: Aortic atherosclerosis. No enlarged abdominal or pelvic lymph nodes. Reproductive: No mass or other significant abnormality. Other: Interval repair of a previously seen large ventral abdominal hernia, with midline ventral wound and overlying dressing material. Small air  loculations within the subcutaneous soft tissues (series 2, image 54). No ascites. Musculoskeletal: No acute or significant osseous findings. IMPRESSION: 1. Interval repair of a previously seen large ventral abdominal hernia with associated small bowel resections, with midline ventral wound and overlying dressing material. 2. Small air loculations within the subcutaneous soft tissues underlying the wound, concerning for postoperative abscess or fistula. No discrete fluid collection is identified. 3. Small locule of air within the mesentery adjacent to the most distal ileal resection and anastomosis, concerning for anastomotic leak. 4. Descending and sigmoid diverticulosis without evidence of acute diverticulitis. 5. Tiny gallstones or sludge in the gallbladder without evidence of acute cholecystitis. 6. Coronary artery disease. Aortic Atherosclerosis (ICD10-I70.0). Electronically Signed   By: Delanna Ahmadi M.D.   On: 08/20/2021 18:03    Procedures Procedures    Medications Ordered in ED Medications - No data to display  ED Course/ Medical Decision Making/ A&P Clinical Course as of 08/20/21 2334  Sat Aug 20, 2021  1732 Discussed case with APP Phylliss Bob who is helping manage patient.  She is not meeting sepsis protocol and is afebrile with a nonperitonitic abdomen.  Pictures in media of recent ex lap site.  Purulent foul-smelling discharge.  She is unable to get CT with contrast due to CKD, will plan to get labs with lactate and CT abdomen pelvis without contrast to further evaluate for intra-abdominal pathology such as intra-abdominal abscess. Likely d/w general surgery after labs and imaging obtained. [VB]  [id number] [EH]  1920 I spoke with Dr. Thermon Leyland of general surgery who reviewed CT findings and lab work.  We discussed patient's presentation.  He recommends Curlex in the wound bed that is moist not damp and dressing changes twice a day, to have her call the office on Monday hopefully for  recheck Monday. No medication changes recommended. I did discuss the possible anastomotic leak, he does not recommend any specific treatment or change in treatment for this. [EH]  1945 Patient with normal lactate 1.7 reassuring.  Creatinine 2.7 at baseline.  Mild leukocytosis and left shift with CT scan findings suggestive of intra-abdominal abscess however.  Case was discussed with surgery who did not recommend any intervention at this time, no antibiotics required.  Patient was well-appearing, nontoxic and in no acute distress with plans for wound care and follow-up in surgery clinic. [VB]    Clinical Course User Index [EH] Lorin Glass, PA-C [VB] Elgie Congo, MD                           Medical Decision Making Amount and/or Complexity of Data Reviewed Labs: ordered. Radiology: ordered.   This patient presents to the ED for concern of increased drainage from her abdominal wall wound, this involves an extensive number of treatment options, and is a complaint that carries with it a high risk of complications and morbidity.  The differential diagnosis includes infection, abscess, fistula, sepsis, cellulitis.    Co morbidities that complicate the patient evaluation  Obesity, chronic abdominal wound, recent resection of small intestine   Additional history obtained:  Additional history obtained from chart review External records from outside source obtained and reviewed including I reviewed outside records including discharge summary, most recent photos obtained during admission of the wound   Lab Tests:  I Ordered, and personally interpreted labs.  The pertinent results include:    Lactate is not significantly elevated at 1.7.  Mild leukocytosis at 11.9 increased from prior labs, 7 days ago was 7.3. GFR and creatinine are both elevated however are consistent with patient's baseline.  Sodium is minimally elevated, patient encouraged for increase p.o. intake of  liquids.    Imaging Studies ordered:  I ordered imaging studies including CT abdomen pelvis without contrast I independently visualized and interpreted imaging which showed loculated normalities of the abdominal wall concerning for abscess/fistula, along with air near the distal ileum and wrist section anastomosis concerning for an anastomotic leak    Consultations Obtained:  I requested consultation with general surgery, I spoke with Dr.Stechschulte,  and discussed lab and imaging findings as well as pertinent plan - they reviewed patient's CT scan, they recommended discharge, wound care with dressing changes twice daily using damp Kerlix to do bride the area, along with having patient be seen in their office early this coming week. Felt that the leukocytosis was secondary to the wound rather than a infection. He reports that he feels the possible anastomotic leak  is more secondary to patient's abdominal wall wound than a true anastomotic leak.   Problem List / ED Course / Critical interventions / Medication management  Wound of the abdominal wall I personally instructed patient and her family member at bedside on gently packing the wound with damp Kerlix, covering with an ABD pad.  This was after I used sterile 4 x 4's to remove most of the liquid discharge from the wound. Wound appeared to track to abdominal wall musculature, there is no obvious fecal contents.  Test / Admission - Considered:  Admission is considered, however after consultation with general surgery wound care was performed, patient is discharged with instructions on appropriate outpatient follow-up.  Return precautions were discussed with patient who states their understanding.  At the time of discharge patient denied any unaddressed complaints or concerns.  Patient is agreeable for discharge home.  Note: Portions of this report may have been transcribed using voice recognition software. Every effort was made to  ensure accuracy; however, inadvertent computerized transcription errors may be present          Final Clinical Impression(s) / ED Diagnoses Final diagnoses:  Visit for wound check    Rx / DC Orders ED Discharge Orders     None         Lorin Glass, PA-C 08/20/21 2349    Elgie Congo, MD 08/20/21 2351

## 2021-08-20 NOTE — ED Triage Notes (Signed)
Pt BIBA from home. Pt had hernia repair and wound began draining today, HH had them call EMS.  Aox4  BP: 128/76 HR: 99 RR: 14 SPO2: 97 RA CBG: 88

## 2021-08-20 NOTE — Discharge Instructions (Addendum)
As we discussed please change the dressing twice a day on the wound. V gauze that you used to gently pack the wound should be slightly damp not dripping wet. If the gauze is very firmly stuck to tissue and you are having to either pull hard or it is painful to remove it then you may get the area slightly damp, however the hope is that the drying gauze will help remove the slough in the wound.  If you develop fevers, nausea, vomiting, are unable to pass gas or have a bowel movement, you notice significant changes to the drainage or have any new or concerning symptoms please seek additional medical care and evaluation.  The dressing that we placed tonight can be left until tomorrow morning unless it gets obviously saturated or dirty.

## 2021-08-20 NOTE — ED Notes (Signed)
Urine sent down to lab just in case of orders.

## 2021-08-23 NOTE — ED Notes (Signed)
Opened chart to clarify discharge instructions with pt/sister regarding f/u appointment.

## 2021-09-01 ENCOUNTER — Other Ambulatory Visit (HOSPITAL_BASED_OUTPATIENT_CLINIC_OR_DEPARTMENT_OTHER): Payer: Self-pay | Admitting: Surgery

## 2021-09-01 ENCOUNTER — Other Ambulatory Visit: Payer: Self-pay | Admitting: Surgery

## 2021-09-01 DIAGNOSIS — Z8719 Personal history of other diseases of the digestive system: Secondary | ICD-10-CM

## 2021-09-01 DIAGNOSIS — L089 Local infection of the skin and subcutaneous tissue, unspecified: Secondary | ICD-10-CM

## 2021-09-08 ENCOUNTER — Ambulatory Visit (HOSPITAL_BASED_OUTPATIENT_CLINIC_OR_DEPARTMENT_OTHER)
Admission: RE | Admit: 2021-09-08 | Discharge: 2021-09-08 | Disposition: A | Discharge status: Home / Self Care | Payer: BC Managed Care – PPO | Source: Ambulatory Visit | Attending: Surgery | Admitting: Surgery

## 2021-09-08 DIAGNOSIS — Z8719 Personal history of other diseases of the digestive system: Secondary | ICD-10-CM | POA: Insufficient documentation

## 2021-09-08 DIAGNOSIS — Z9889 Other specified postprocedural states: Secondary | ICD-10-CM | POA: Insufficient documentation

## 2021-09-08 DIAGNOSIS — L089 Local infection of the skin and subcutaneous tissue, unspecified: Secondary | ICD-10-CM | POA: Diagnosis present

## 2021-09-08 DIAGNOSIS — T148XXA Other injury of unspecified body region, initial encounter: Secondary | ICD-10-CM | POA: Diagnosis present

## 2021-10-04 ENCOUNTER — Emergency Department (HOSPITAL_COMMUNITY)
Admission: EM | Admit: 2021-10-04 | Discharge: 2021-10-04 | Disposition: A | Discharge status: Home / Self Care | Payer: BC Managed Care – PPO | Attending: Emergency Medicine | Admitting: Emergency Medicine

## 2021-10-04 ENCOUNTER — Encounter (HOSPITAL_COMMUNITY): Payer: Self-pay | Admitting: Emergency Medicine

## 2021-10-04 DIAGNOSIS — D649 Anemia, unspecified: Secondary | ICD-10-CM | POA: Diagnosis not present

## 2021-10-04 DIAGNOSIS — N189 Chronic kidney disease, unspecified: Secondary | ICD-10-CM | POA: Diagnosis not present

## 2021-10-04 DIAGNOSIS — Z5189 Encounter for other specified aftercare: Secondary | ICD-10-CM

## 2021-10-04 DIAGNOSIS — Z4889 Encounter for other specified surgical aftercare: Secondary | ICD-10-CM | POA: Insufficient documentation

## 2021-10-04 DIAGNOSIS — Z79899 Other long term (current) drug therapy: Secondary | ICD-10-CM | POA: Diagnosis not present

## 2021-10-04 DIAGNOSIS — I129 Hypertensive chronic kidney disease with stage 1 through stage 4 chronic kidney disease, or unspecified chronic kidney disease: Secondary | ICD-10-CM | POA: Diagnosis not present

## 2021-10-04 LAB — CBC
HCT: 30.8 % — ABNORMAL LOW (ref 36.0–46.0)
Hemoglobin: 9.8 g/dL — ABNORMAL LOW (ref 12.0–15.0)
MCH: 29.4 pg (ref 26.0–34.0)
MCHC: 31.8 g/dL (ref 30.0–36.0)
MCV: 92.5 fL (ref 80.0–100.0)
Platelets: 219 10*3/uL (ref 150–400)
RBC: 3.33 MIL/uL — ABNORMAL LOW (ref 3.87–5.11)
RDW: 20.6 % — ABNORMAL HIGH (ref 11.5–15.5)
WBC: 8.7 10*3/uL (ref 4.0–10.5)
nRBC: 0 % (ref 0.0–0.2)

## 2021-10-04 MED ORDER — OXYCODONE HCL 5 MG PO TABS
5.0000 mg | ORAL_TABLET | Freq: Once | ORAL | Status: AC
  Administered 2021-10-04: 5 mg via ORAL
  Filled 2021-10-04: qty 1

## 2021-10-04 NOTE — ED Triage Notes (Signed)
Per EMS, patient from home, c/o pressure ulcer to RLQ since July after hernia surgery. States bleeding started today with difficulty getting it to stop at home. Bleeding stopped when EMS arrived. Home health manages hernia repair wound.   BP 138/80 HR 80 RR 16 98% RA  CBG 103

## 2021-10-04 NOTE — ED Notes (Signed)
Patient's wound dressed per provider request. Xeroform and ABD pad applied with paper tape to secure dressing.

## 2021-10-04 NOTE — ED Notes (Signed)
Patient getting dressed and calling for ride at this time. Will assist out with wheelchair.

## 2021-10-04 NOTE — Discharge Instructions (Signed)
Your blood levels were reassuring.  Your bleeding has stopped and require no further work-up or management at this time of your wound.  Follow-up with your wound nurse as planned.  If bleeding recurs, make sure to hold pressure at that site for 15 minutes before rechecking if it is continuing bleeding.  If it continues to bleed more than 15 minutes after pressure or you are having worsening pain, new purulent drainage or any other symptoms concerning to you, come back to the ER or follow-up with your surgeon.

## 2021-10-04 NOTE — ED Provider Notes (Signed)
[facility] DEPT Provider Note   CSN: [id number] Arrival date & time: 10/04/21  0856     History  Chief Complaint  Patient presents with   Wound Check    Danell Ciesla is a 66 y.o. female.with pmg HTN, HLD, CKD, exploratory laparotomy, small bowel resection, lysis of adhesions x 90 minutes, and primary repair of incarcerated ventral hernia on 07/22/2021 by Dr. Bobbye Morton presenting with wound problems.  Patient is here mainly because of complaints of bleeding from a pressure wound on her abdomen.  She said there was some oozing blood coming from her lower abdomen pressure wound earlier this morning but eventually stopped with pressure.  Is no longer bleeding on exam.  She does have some pain at that site.  No fevers, no purulent drainage, no fevers.  Her abdominal wound from the surgical repair of her ventral hernia has been managed with a wound VAC.  The wound VAC is currently not in because she said she is getting sponges today from the wound care nurse who will be visiting. No worsening or changing abdominal pain.   Wound Check       Home Medications Prior to Admission medications   Medication Sig Start Date End Date Taking? Authorizing Provider  acetaminophen (TYLENOL) 325 MG tablet Take 2 tablets (650 mg total) by mouth every 6 (six) hours as needed for mild pain or fever. 08/16/21   Little Ishikawa, MD  collagenase (SANTYL) 250 UNIT/GM ointment Apply topically daily. 08/16/21   Little Ishikawa, MD  iron polysaccharides (NIFEREX) 150 MG capsule Take 1 capsule (150 mg total) by mouth daily. 08/16/21   Little Ishikawa, MD  loperamide (IMODIUM) 2 MG capsule Take 1 capsule (2 mg total) by mouth every 4 (four) hours as needed for diarrhea or loose stools. 08/16/21   Little Ishikawa, MD  magnesium oxide (MAG-OX) 400 (240 Mg) MG tablet Take 1 tablet (400 mg total) by mouth 2 (two) times daily. 08/16/21   Little Ishikawa, MD  metoprolol tartrate  (LOPRESSOR) 25 MG tablet Take 1 tablet (25 mg total) by mouth 2 (two) times daily. 08/16/21   Little Ishikawa, MD  multivitamin (RENA-VIT) TABS tablet Take 1 tablet by mouth at bedtime. 08/16/21   Little Ishikawa, MD  neomycin-bacitracin-polymyxin (NEOSPORIN) OINT Apply 1 Application topically 3 (three) times daily. 08/16/21   Little Ishikawa, MD  pantoprazole (PROTONIX) 40 MG tablet Take 1 tablet (40 mg total) by mouth daily. 08/16/21   Little Ishikawa, MD      Allergies    Lisinopril    Review of Systems   Review of Systems  Physical Exam Updated Vital Signs BP 123/75 (BP Location: Right Arm)   Pulse 85   Temp (!) 97.5 F (36.4 C) (Oral)   Resp 18   SpO2 100%  Physical Exam Constitutional: Alert and oriented.  Chronically ill but no acute distress  eyes: Conjunctivae are normal. ENT      Head: Normocephalic and atraumatic.      Nose: No congestion.      Mouth/Throat: Mucous membranes are moist.      Neck: No stridor. Cardiovascular: S1, S2, regular rate  respiratory: Normal respiratory effort. Gastrointestinal: Soft and nontender.  Mildly tender pressure wound of the right lower quadrant of the abdomen as can be seen in media tab with some healthy pink and mildly erythematous tissue but no active bleeding or hemorrhage.  No oozing or drainage, no purulence, no surrounding  erythema or warmth.  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 Musculoskeletal: Normal range of motion in all extremities. Neurologic: Normal speech and language. No gross focal neurologic deficits are appreciated. Skin: Skin is warm,see media and abdomen exam Psychiatric: Mood and affect are normal. Speech and behavior are normal.   ED Results / Procedures / Treatments   Labs (all labs ordered are listed, but only abnormal results are displayed) Labs Reviewed  CBC - Abnormal;  Notable for the following components:      Result Value   RBC 3.33 (*)    Hemoglobin 9.8 (*)    HCT 30.8 (*)    RDW 20.6 (*)    All other components within normal limits    EKG None  Radiology No results found.  Procedures Procedures    Medications Ordered in ED Medications  oxyCODONE (Oxy IR/ROXICODONE) immediate release tablet 5 mg (5 mg Oral Given 10/04/21 1014)    ED Course/ Medical Decision Making/ A&P                           Medical Decision Making Dannisha Eckmann is a 66 y.o. female.with pmg HTN, HLD, CKD, exploratory laparotomy, small bowel resection, lysis of adhesions x 90 minutes, and primary repair of incarcerated ventral hernia on 07/22/2021 by Dr. Bobbye Morton presenting with wound problems.  Patient is here mainly because of complaints of bleeding from a pressure wound on her abdomen.   See media image of abdominal pressure wound.  There is clean and healthy pink tissue.  There is no active hemorrhage or bleeding.  There is no evidence of infection with normal WBC 8.7 and she is hemodynamically stable.  CBC obtained with stable Hgb 9.8.  Abdominal pressure wound was redressed with Xeroform, ABD pad and dressing.  She was given oxycodone for pain.  She is safe for discharge with follow-up with her wound care nurse and surgeon.  Amount and/or Complexity of Data Reviewed Labs: ordered.  Risk Prescription drug management.    Final Clinical Impression(s) / ED Diagnoses Final diagnoses:  Visit for wound check    Rx / DC Orders ED Discharge Orders     None         Elgie Congo, MD 10/04/21 1030
